# Patient Record
Sex: Female | Born: 1935 | Race: White | Hispanic: No | Marital: Married | Smoking: Former smoker
Health system: Southern US, Community
[De-identification: ages and names within clinical notes are randomized; demographics above are authoritative.]

## PROBLEM LIST (undated history)

## (undated) DIAGNOSIS — M359 Systemic involvement of connective tissue, unspecified: Secondary | ICD-10-CM

## (undated) DIAGNOSIS — E876 Hypokalemia: Secondary | ICD-10-CM

## (undated) DIAGNOSIS — I484 Atypical atrial flutter: Secondary | ICD-10-CM

## (undated) DIAGNOSIS — H269 Unspecified cataract: Secondary | ICD-10-CM

## (undated) DIAGNOSIS — I5032 Chronic diastolic (congestive) heart failure: Secondary | ICD-10-CM

## (undated) DIAGNOSIS — R911 Solitary pulmonary nodule: Secondary | ICD-10-CM

## (undated) DIAGNOSIS — E039 Hypothyroidism, unspecified: Secondary | ICD-10-CM

## (undated) DIAGNOSIS — J449 Chronic obstructive pulmonary disease, unspecified: Secondary | ICD-10-CM

## (undated) DIAGNOSIS — N182 Chronic kidney disease, stage 2 (mild): Secondary | ICD-10-CM

## (undated) DIAGNOSIS — I10 Essential (primary) hypertension: Secondary | ICD-10-CM

## (undated) DIAGNOSIS — I480 Paroxysmal atrial fibrillation: Secondary | ICD-10-CM

## (undated) DIAGNOSIS — C3490 Malignant neoplasm of unspecified part of unspecified bronchus or lung: Secondary | ICD-10-CM

## (undated) HISTORY — PX: APPENDECTOMY: SHX54

## (undated) HISTORY — PX: TOTAL KNEE ARTHROPLASTY: SHX125

## (undated) HISTORY — DX: Chronic diastolic (congestive) heart failure: I50.32

## (undated) HISTORY — DX: Malignant neoplasm of unspecified part of unspecified bronchus or lung: C34.90

## (undated) HISTORY — DX: Chronic kidney disease, stage 2 (mild): N18.2

## (undated) HISTORY — DX: Hypokalemia: E87.6

## (undated) HISTORY — DX: Paroxysmal atrial fibrillation: I48.0

## (undated) HISTORY — DX: Solitary pulmonary nodule: R91.1

## (undated) HISTORY — PX: EYE SURGERY: SHX253

---

## 2013-08-24 DIAGNOSIS — N189 Chronic kidney disease, unspecified: Secondary | ICD-10-CM | POA: Insufficient documentation

## 2013-08-24 DIAGNOSIS — E039 Hypothyroidism, unspecified: Secondary | ICD-10-CM | POA: Insufficient documentation

## 2013-08-24 DIAGNOSIS — J441 Chronic obstructive pulmonary disease with (acute) exacerbation: Secondary | ICD-10-CM | POA: Insufficient documentation

## 2013-08-24 DIAGNOSIS — I10 Essential (primary) hypertension: Secondary | ICD-10-CM | POA: Insufficient documentation

## 2013-09-12 ENCOUNTER — Inpatient Hospital Stay: Payer: Self-pay | Admitting: Internal Medicine

## 2013-09-12 LAB — CBC WITH DIFFERENTIAL/PLATELET
Basophil #: 0.1 10*3/uL (ref 0.0–0.1)
Basophil %: 1.9 %
Eosinophil #: 0 10*3/uL (ref 0.0–0.7)
HCT: 37.5 % (ref 35.0–47.0)
HGB: 12.4 g/dL (ref 12.0–16.0)
Lymphocyte #: 0.6 10*3/uL — ABNORMAL LOW (ref 1.0–3.6)
Lymphocyte %: 17.5 %
MCH: 28.8 pg (ref 26.0–34.0)
MCHC: 33 g/dL (ref 32.0–36.0)
MCV: 87 fL (ref 80–100)
Monocyte #: 0.5 x10 3/mm (ref 0.2–0.9)
Monocyte %: 12.9 %
Neutrophil #: 2.4 10*3/uL (ref 1.4–6.5)
Platelet: 163 10*3/uL (ref 150–440)
RBC: 4.29 10*6/uL (ref 3.80–5.20)
RDW: 15.4 % — ABNORMAL HIGH (ref 11.5–14.5)

## 2013-09-12 LAB — PROTIME-INR
INR: 1.1
Prothrombin Time: 14.6 secs (ref 11.5–14.7)

## 2013-09-12 LAB — CK TOTAL AND CKMB (NOT AT ARMC): CK, Total: 154 U/L (ref 21–215)

## 2013-09-12 LAB — CREATININE, SERUM
Creatinine: 1.07 mg/dL (ref 0.60–1.30)
EGFR (African American): 58 — ABNORMAL LOW
EGFR (Non-African Amer.): 50 — ABNORMAL LOW

## 2013-09-12 LAB — APTT: Activated PTT: 53.9 secs — ABNORMAL HIGH (ref 23.6–35.9)

## 2013-09-12 LAB — TROPONIN I
Troponin-I: 0.04 ng/mL
Troponin-I: 0.04 ng/mL

## 2013-09-13 LAB — COMPREHENSIVE METABOLIC PANEL
Albumin: 3 g/dL — ABNORMAL LOW (ref 3.4–5.0)
Anion Gap: 5 — ABNORMAL LOW (ref 7–16)
BUN: 27 mg/dL — ABNORMAL HIGH (ref 7–18)
Co2: 32 mmol/L (ref 21–32)
Glucose: 139 mg/dL — ABNORMAL HIGH (ref 65–99)
Osmolality: 287 (ref 275–301)
Potassium: 3.2 mmol/L — ABNORMAL LOW (ref 3.5–5.1)
SGOT(AST): 31 U/L (ref 15–37)
SGPT (ALT): 20 U/L (ref 12–78)
Sodium: 140 mmol/L (ref 136–145)
Total Protein: 6.6 g/dL (ref 6.4–8.2)

## 2013-09-13 LAB — CBC WITH DIFFERENTIAL/PLATELET
Basophil #: 0.1 10*3/uL (ref 0.0–0.1)
Basophil %: 2 %
Eosinophil #: 0 10*3/uL (ref 0.0–0.7)
Eosinophil %: 0 %
HCT: 38.5 % (ref 35.0–47.0)
Lymphocyte #: 0.4 10*3/uL — ABNORMAL LOW (ref 1.0–3.6)
Lymphocyte %: 13.3 %
MCHC: 32.7 g/dL (ref 32.0–36.0)
MCV: 88 fL (ref 80–100)
Monocyte #: 0.1 x10 3/mm — ABNORMAL LOW (ref 0.2–0.9)
Neutrophil #: 2.1 10*3/uL (ref 1.4–6.5)
Neutrophil %: 79.6 %
RBC: 4.39 10*6/uL (ref 3.80–5.20)
WBC: 2.7 10*3/uL — ABNORMAL LOW (ref 3.6–11.0)

## 2013-09-13 LAB — APTT: Activated PTT: >160 secs (ref 23.6–35.9)

## 2013-09-13 LAB — MAGNESIUM: Magnesium: 1.5 mg/dL — ABNORMAL LOW

## 2013-09-13 LAB — PRO B NATRIURETIC PEPTIDE: B-Type Natriuretic Peptide: 9027 pg/mL — ABNORMAL HIGH (ref 0–450)

## 2013-09-17 LAB — CREATININE, SERUM
Creatinine: 1.23 mg/dL (ref 0.60–1.30)
EGFR (African American): 49 — ABNORMAL LOW

## 2013-09-25 LAB — PULMONARY FUNCTION TEST

## 2013-09-26 ENCOUNTER — Inpatient Hospital Stay: Payer: Self-pay | Admitting: Internal Medicine

## 2013-09-26 LAB — CBC
HCT: 45.8 % (ref 35.0–47.0)
HGB: 15.2 g/dL (ref 12.0–16.0)
MCH: 28.4 pg (ref 26.0–34.0)
MCHC: 33.2 g/dL (ref 32.0–36.0)
MCV: 86 fL (ref 80–100)
Platelet: 285 10*3/uL (ref 150–440)
RBC: 5.36 10*6/uL — ABNORMAL HIGH (ref 3.80–5.20)
RDW: 15.3 % — AB (ref 11.5–14.5)
WBC: 15.6 10*3/uL — ABNORMAL HIGH (ref 3.6–11.0)

## 2013-09-26 LAB — COMPREHENSIVE METABOLIC PANEL
ALBUMIN: 3.5 g/dL (ref 3.4–5.0)
Alkaline Phosphatase: 58 U/L
Anion Gap: 5 — ABNORMAL LOW (ref 7–16)
BUN: 48 mg/dL — AB (ref 7–18)
Bilirubin,Total: 1.2 mg/dL — ABNORMAL HIGH (ref 0.2–1.0)
CALCIUM: 9.4 mg/dL (ref 8.5–10.1)
CO2: 32 mmol/L (ref 21–32)
Chloride: 96 mmol/L — ABNORMAL LOW (ref 98–107)
Creatinine: 1.14 mg/dL (ref 0.60–1.30)
EGFR (African American): 54 — ABNORMAL LOW
EGFR (Non-African Amer.): 47 — ABNORMAL LOW
Glucose: 106 mg/dL — ABNORMAL HIGH (ref 65–99)
Osmolality: 279 (ref 275–301)
Potassium: 2.9 mmol/L — ABNORMAL LOW (ref 3.5–5.1)
SGOT(AST): 27 U/L (ref 15–37)
SGPT (ALT): 26 U/L (ref 12–78)
SODIUM: 133 mmol/L — AB (ref 136–145)
Total Protein: 7.4 g/dL (ref 6.4–8.2)

## 2013-09-26 LAB — TROPONIN I: TROPONIN-I: 0.16 ng/mL — AB

## 2013-09-26 LAB — CK TOTAL AND CKMB (NOT AT ARMC)
CK, TOTAL: 52 U/L (ref 21–215)
CK-MB: 3.3 ng/mL (ref 0.5–3.6)

## 2013-09-26 LAB — RAPID INFLUENZA A&B ANTIGENS

## 2013-09-26 LAB — PRO B NATRIURETIC PEPTIDE: B-TYPE NATIURETIC PEPTID: 3323 pg/mL — AB (ref 0–450)

## 2013-09-27 LAB — BASIC METABOLIC PANEL
Anion Gap: 6 — ABNORMAL LOW (ref 7–16)
BUN: 49 mg/dL — ABNORMAL HIGH (ref 7–18)
CHLORIDE: 97 mmol/L — AB (ref 98–107)
CO2: 32 mmol/L (ref 21–32)
Calcium, Total: 8.8 mg/dL (ref 8.5–10.1)
Creatinine: 1.28 mg/dL (ref 0.60–1.30)
EGFR (African American): 47 — ABNORMAL LOW
EGFR (Non-African Amer.): 41 — ABNORMAL LOW
GLUCOSE: 143 mg/dL — AB (ref 65–99)
Osmolality: 286 (ref 275–301)
Potassium: 3.1 mmol/L — ABNORMAL LOW (ref 3.5–5.1)
Sodium: 135 mmol/L — ABNORMAL LOW (ref 136–145)

## 2013-09-27 LAB — TROPONIN I: Troponin-I: 0.07 ng/mL — ABNORMAL HIGH

## 2013-09-28 LAB — CBC WITH DIFFERENTIAL/PLATELET
Basophil #: 0 10*3/uL (ref 0.0–0.1)
Basophil %: 0.1 %
Eosinophil #: 0 10*3/uL (ref 0.0–0.7)
Eosinophil %: 0 %
HCT: 36.1 % (ref 35.0–47.0)
HGB: 12.2 g/dL (ref 12.0–16.0)
Lymphocyte #: 0.7 10*3/uL — ABNORMAL LOW (ref 1.0–3.6)
Lymphocyte %: 4.1 %
MCH: 28.9 pg (ref 26.0–34.0)
MCHC: 33.8 g/dL (ref 32.0–36.0)
MCV: 86 fL (ref 80–100)
MONO ABS: 0.5 x10 3/mm (ref 0.2–0.9)
Monocyte %: 2.7 %
Neutrophil #: 16.7 10*3/uL — ABNORMAL HIGH (ref 1.4–6.5)
Neutrophil %: 93.1 %
Platelet: 242 10*3/uL (ref 150–440)
RBC: 4.23 10*6/uL (ref 3.80–5.20)
RDW: 15.1 % — ABNORMAL HIGH (ref 11.5–14.5)
WBC: 18 10*3/uL — ABNORMAL HIGH (ref 3.6–11.0)

## 2013-09-28 LAB — BASIC METABOLIC PANEL
ANION GAP: 7 (ref 7–16)
BUN: 57 mg/dL — ABNORMAL HIGH (ref 7–18)
CALCIUM: 8.7 mg/dL (ref 8.5–10.1)
CHLORIDE: 98 mmol/L (ref 98–107)
CO2: 27 mmol/L (ref 21–32)
CREATININE: 1.39 mg/dL — AB (ref 0.60–1.30)
EGFR (African American): 43 — ABNORMAL LOW
GFR CALC NON AF AMER: 37 — AB
GLUCOSE: 139 mg/dL — AB (ref 65–99)
OSMOLALITY: 283 (ref 275–301)
Potassium: 3.7 mmol/L (ref 3.5–5.1)
SODIUM: 132 mmol/L — AB (ref 136–145)

## 2013-10-16 ENCOUNTER — Ambulatory Visit: Payer: Self-pay | Admitting: Medical

## 2013-11-15 ENCOUNTER — Emergency Department (HOSPITAL_COMMUNITY): Payer: Medicare Other

## 2013-11-15 ENCOUNTER — Inpatient Hospital Stay (HOSPITAL_COMMUNITY)
Admission: EM | Admit: 2013-11-15 | Discharge: 2013-11-24 | DRG: 308 | Disposition: A | Discharge status: Home / Self Care | Payer: Medicare Other | Attending: Family Medicine | Admitting: Family Medicine

## 2013-11-15 ENCOUNTER — Encounter (HOSPITAL_COMMUNITY): Payer: Self-pay | Admitting: Emergency Medicine

## 2013-11-15 DIAGNOSIS — R7989 Other specified abnormal findings of blood chemistry: Secondary | ICD-10-CM

## 2013-11-15 DIAGNOSIS — Z87891 Personal history of nicotine dependence: Secondary | ICD-10-CM

## 2013-11-15 DIAGNOSIS — I471 Supraventricular tachycardia, unspecified: Secondary | ICD-10-CM | POA: Diagnosis present

## 2013-11-15 DIAGNOSIS — Z79899 Other long term (current) drug therapy: Secondary | ICD-10-CM

## 2013-11-15 DIAGNOSIS — B9789 Other viral agents as the cause of diseases classified elsewhere: Secondary | ICD-10-CM | POA: Diagnosis present

## 2013-11-15 DIAGNOSIS — E872 Acidosis, unspecified: Secondary | ICD-10-CM

## 2013-11-15 DIAGNOSIS — J449 Chronic obstructive pulmonary disease, unspecified: Secondary | ICD-10-CM | POA: Diagnosis present

## 2013-11-15 DIAGNOSIS — J441 Chronic obstructive pulmonary disease with (acute) exacerbation: Secondary | ICD-10-CM | POA: Diagnosis present

## 2013-11-15 DIAGNOSIS — M25519 Pain in unspecified shoulder: Secondary | ICD-10-CM | POA: Diagnosis present

## 2013-11-15 DIAGNOSIS — E039 Hypothyroidism, unspecified: Secondary | ICD-10-CM | POA: Diagnosis present

## 2013-11-15 DIAGNOSIS — I484 Atypical atrial flutter: Secondary | ICD-10-CM

## 2013-11-15 DIAGNOSIS — I4891 Unspecified atrial fibrillation: Secondary | ICD-10-CM

## 2013-11-15 DIAGNOSIS — T380X5A Adverse effect of glucocorticoids and synthetic analogues, initial encounter: Secondary | ICD-10-CM | POA: Diagnosis not present

## 2013-11-15 DIAGNOSIS — A419 Sepsis, unspecified organism: Secondary | ICD-10-CM

## 2013-11-15 DIAGNOSIS — N189 Chronic kidney disease, unspecified: Secondary | ICD-10-CM | POA: Diagnosis present

## 2013-11-15 DIAGNOSIS — J069 Acute upper respiratory infection, unspecified: Secondary | ICD-10-CM | POA: Diagnosis present

## 2013-11-15 DIAGNOSIS — I440 Atrioventricular block, first degree: Secondary | ICD-10-CM | POA: Diagnosis present

## 2013-11-15 DIAGNOSIS — I129 Hypertensive chronic kidney disease with stage 1 through stage 4 chronic kidney disease, or unspecified chronic kidney disease: Secondary | ICD-10-CM | POA: Diagnosis present

## 2013-11-15 DIAGNOSIS — I4892 Unspecified atrial flutter: Secondary | ICD-10-CM | POA: Diagnosis present

## 2013-11-15 DIAGNOSIS — N179 Acute kidney failure, unspecified: Secondary | ICD-10-CM | POA: Diagnosis present

## 2013-11-15 DIAGNOSIS — E119 Type 2 diabetes mellitus without complications: Secondary | ICD-10-CM | POA: Diagnosis present

## 2013-11-15 DIAGNOSIS — R5381 Other malaise: Secondary | ICD-10-CM | POA: Diagnosis present

## 2013-11-15 DIAGNOSIS — J9600 Acute respiratory failure, unspecified whether with hypoxia or hypercapnia: Secondary | ICD-10-CM | POA: Diagnosis present

## 2013-11-15 DIAGNOSIS — E876 Hypokalemia: Secondary | ICD-10-CM | POA: Diagnosis present

## 2013-11-15 DIAGNOSIS — G8929 Other chronic pain: Secondary | ICD-10-CM | POA: Diagnosis present

## 2013-11-15 DIAGNOSIS — I959 Hypotension, unspecified: Secondary | ICD-10-CM

## 2013-11-15 DIAGNOSIS — E785 Hyperlipidemia, unspecified: Secondary | ICD-10-CM | POA: Diagnosis present

## 2013-11-15 DIAGNOSIS — R778 Other specified abnormalities of plasma proteins: Secondary | ICD-10-CM

## 2013-11-15 HISTORY — DX: Essential (primary) hypertension: I10

## 2013-11-15 HISTORY — DX: Hypothyroidism, unspecified: E03.9

## 2013-11-15 HISTORY — DX: Unspecified cataract: H26.9

## 2013-11-15 HISTORY — DX: Atypical atrial flutter: I48.4

## 2013-11-15 HISTORY — DX: Chronic obstructive pulmonary disease, unspecified: J44.9

## 2013-11-15 LAB — BASIC METABOLIC PANEL
BUN: 26 mg/dL — ABNORMAL HIGH (ref 6–23)
CHLORIDE: 95 meq/L — AB (ref 96–112)
CO2: 30 mEq/L (ref 19–32)
Calcium: 9.1 mg/dL (ref 8.4–10.5)
Creatinine, Ser: 1.18 mg/dL — ABNORMAL HIGH (ref 0.50–1.10)
GFR calc non Af Amer: 44 mL/min — ABNORMAL LOW (ref >90)
GFR, EST AFRICAN AMERICAN: 51 mL/min — AB (ref >90)
Glucose, Bld: 124 mg/dL — ABNORMAL HIGH (ref 70–99)
POTASSIUM: 3.1 meq/L — AB (ref 3.7–5.3)
Sodium: 140 mEq/L (ref 137–147)

## 2013-11-15 LAB — I-STAT TROPONIN, ED: Troponin i, poc: 0.03 ng/mL (ref 0.00–0.08)

## 2013-11-15 LAB — PRO B NATRIURETIC PEPTIDE: Pro B Natriuretic peptide (BNP): 2417 pg/mL — ABNORMAL HIGH (ref 0–450)

## 2013-11-15 LAB — CBC
HCT: 39.7 % (ref 36.0–46.0)
HEMOGLOBIN: 13.2 g/dL (ref 12.0–15.0)
MCH: 29.6 pg (ref 26.0–34.0)
MCHC: 33.2 g/dL (ref 30.0–36.0)
MCV: 89 fL (ref 78.0–100.0)
Platelets: 206 10*3/uL (ref 150–400)
RBC: 4.46 MIL/uL (ref 3.87–5.11)
RDW: 16.8 % — ABNORMAL HIGH (ref 11.5–15.5)
WBC: 9.5 10*3/uL (ref 4.0–10.5)

## 2013-11-15 MED ORDER — ALBUTEROL (5 MG/ML) CONTINUOUS INHALATION SOLN
15.0000 mg/h | INHALATION_SOLUTION | Freq: Once | RESPIRATORY_TRACT | Status: AC
  Administered 2013-11-15: 15 mg/h via RESPIRATORY_TRACT
  Filled 2013-11-15: qty 20

## 2013-11-15 MED ORDER — METHYLPREDNISOLONE SODIUM SUCC 125 MG IJ SOLR
125.0000 mg | Freq: Once | INTRAMUSCULAR | Status: AC
  Administered 2013-11-15: 125 mg via INTRAVENOUS
  Filled 2013-11-15: qty 2

## 2013-11-15 MED ORDER — DILTIAZEM HCL 25 MG/5ML IV SOLN
10.0000 mg | Freq: Once | INTRAVENOUS | Status: AC
  Administered 2013-11-15: 10 mg via INTRAVENOUS
  Filled 2013-11-15: qty 5

## 2013-11-15 MED ORDER — IPRATROPIUM BROMIDE 0.02 % IN SOLN
1.0000 mg | Freq: Once | RESPIRATORY_TRACT | Status: AC
  Administered 2013-11-15: 1 mg via RESPIRATORY_TRACT
  Filled 2013-11-15: qty 5

## 2013-11-15 MED ORDER — DILTIAZEM HCL 100 MG IV SOLR
5.0000 mg/h | INTRAVENOUS | Status: AC
  Administered 2013-11-15: 5 mg/h via INTRAVENOUS
  Administered 2013-11-15: 10 mg/h via INTRAVENOUS
  Administered 2013-11-15 – 2013-11-16 (×3): 15 mg/h via INTRAVENOUS

## 2013-11-15 NOTE — ED Notes (Addendum)
Report attempted x1. States will called back.

## 2013-11-15 NOTE — ED Notes (Signed)
Pt states SOB and CP left side to shoulder. Hx of Afib and states she feels like her rhythm is back to normal. States she has had to episodes of PNA. Last time Christmas.

## 2013-11-15 NOTE — ED Notes (Signed)
[id number], Dr. Darl Householder notified

## 2013-11-15 NOTE — ED Provider Notes (Signed)
CSN: [id number]     Arrival date & time 11/15/13  1915 History   First MD Initiated Contact with Patient 11/15/13 1922     Chief Complaint  Patient presents with  . Shortness of Breath     (Consider location/radiation/quality/duration/timing/severity/associated sxs/prior Treatment) The history is provided by the patient.  Toni [name] is a 79 y.o. female hx of COPD, paroxysmal afib not on coumadin here with shortness of breath, palpitations. Shortness of breath with exertion over the last week. She'll son notes that she has been wheezing. She just finished a course of steroid a week ago. She noticed palpitations and shortness of breath worsening yesterday so went to [facility] and was given a medication to slow down her heart rate. Today she notes worse shortness of breath and palpitations and came for evaluation. Denies fever. She was a former smoker.    History reviewed. No pertinent past medical history. History reviewed. No pertinent past surgical history. History reviewed. No pertinent family history. History  Substance Use Topics  . Smoking status: Former Research scientist (life sciences)  . Smokeless tobacco: Never Used  . Alcohol Use: No   OB History   Grav Para Term Preterm Abortions TAB SAB Ect Mult Living                 Review of Systems  Respiratory: Positive for shortness of breath.   All other systems reviewed and are negative.      Allergies  Review of patient's allergies indicates not on file.  Home Medications   Current Outpatient Rx  Name  Route  Sig  Dispense  Refill  . ALPRAZolam (XANAX) 0.5 MG tablet   Oral   Take 0.5 mg by mouth 2 (two) times daily as needed for anxiety.          BP 125/74  Pulse 105  Temp(Src) 98.5 F (36.9 C) (Oral)  Resp 24  Ht 5\' 1"  (1.549 m)  Wt 173 lb (78.472 kg)  BMI 32.70 kg/m2  SpO2 100% Physical Exam  Nursing note and vitals reviewed. Constitutional: She is oriented to person, place, and time.  Uncomfortable, tachypneic    HENT:  Head: Normocephalic.  Mouth/Throat: Oropharynx is clear and moist.  Eyes: Conjunctivae are normal. Pupils are equal, round, and reactive to light.  Neck: Normal range of motion. Neck supple.  Cardiovascular: Normal heart sounds.   Irregular, tachycardic   Pulmonary/Chest:  Tachypneic, + diffuse wheezing, no crackles   Abdominal: Soft. Bowel sounds are normal. She exhibits no distension. There is no tenderness. There is no rebound and no guarding.  Musculoskeletal: Normal range of motion. She exhibits no tenderness.  Neurological: She is alert and oriented to person, place, and time. No cranial nerve deficit. Coordination normal.  Skin: Skin is warm and dry.  Psychiatric: She has a normal mood and affect. Her behavior is normal. Judgment and thought content normal.    ED Course  Procedures (including critical care time) CRITICAL CARE Performed by: Darl Householder, DAVID   Total critical care time: 30 min   Critical care time was exclusive of separately billable procedures and treating other patients.  Critical care was necessary to treat or prevent imminent or life-threatening deterioration.  Critical care was time spent personally by me on the following activities: development of treatment plan with patient and/or surrogate as well as nursing, discussions with consultants, evaluation of patient's response to treatment, examination of patient, obtaining history from patient or surrogate, ordering and performing treatments and interventions, ordering and review  of laboratory studies, ordering and review of radiographic studies, pulse oximetry and re-evaluation of patient's condition.   Labs Review Labs Reviewed  CBC - Abnormal; Notable for the following:    RDW 16.8 (*)    All other components within normal limits  BASIC METABOLIC PANEL - Abnormal; Notable for the following:    Potassium 3.1 (*)    Chloride 95 (*)    Glucose, Bld 124 (*)    BUN 26 (*)    Creatinine, Ser 1.18 (*)     GFR calc non Af Amer 44 (*)    GFR calc Af Amer 51 (*)    All other components within normal limits  PRO B NATRIURETIC PEPTIDE - Abnormal; Notable for the following:    Pro B Natriuretic peptide (BNP) 2417.0 (*)    All other components within normal limits  I-STAT TROPOININ, ED   Imaging Review Dg Chest Portable 1 View  11/15/2013   CLINICAL DATA:  Short of breath for several days.  EXAM: PORTABLE CHEST - 1 VIEW  COMPARISON:  11/12/2008  FINDINGS: Cardiac silhouette is normal in size. Aorta is mildly uncoiled. No mediastinal or hilar masses.  Small ill-defined nodule in the right mid lung is stable from the prior exam. No lung consolidation or edema. No pleural effusion or pneumothorax.  The bony thorax is diffusely demineralized.  IMPRESSION: No acute cardiopulmonary disease.   Electronically Signed   By: Lajean Manes M.D.   On: 11/15/2013 19:55     EKG Interpretation   Date/Time:  Saturday November 15 2013 19:18:21 EST Ventricular Rate:  104 PR Interval:    QRS Duration: 88 QT Interval:  370 QTC Calculation: 486 R Axis:   53 Text Interpretation:  Atrial flutter with variable A-V block Nonspecific  ST abnormality Abnormal ECG No previous ECGs available Confirmed by YAO   MD, DAVID ([id number]) on 11/15/2013 7:23:13 PM      MDM   Final diagnoses:  None   Toni [name] is a 79 y.o. female here with SOB, palpitations. Likely COPD exacerbation along with afib. I doubt PE given that she has obvious wheezing on exam to explain her shortness of breath. Will get cxr, labs. Will give nebs, steroids, cardizem for rate control.    8:48 PM After getting nebs, breathing improved. But HR went up to 160s. Given cardizem, started on cardizem drip for rate control. Will admit for COPD exacerbation, rapid afib   Wandra Arthurs, MD 11/15/13 2052

## 2013-11-15 NOTE — H&P (Signed)
[facility] Admission History and Physical Service Pager: [phone]  Patient name: Toni [name] Medical record number: [id number] Date of birth: 09-08-1935 Age: 79 y.o. Gender: female  Primary Care Provider: Tomma Rakers, PA-C Consultants: Cardiology Code Status: Full  Chief Complaint: SOB, heart palpitations  Assessment and Plan: Toni [name] is a 79 y.o. female presenting with palpatations and shortness of breath found to be in atrial fibrillation with RVR. PMH is significant for COPD, paroxysmal AF (not on anticoag), hypertension, hypothyroidism.  # Afib with RVR: history of paroxysmal afib and anticoagulation treatment with pradaxa, but currently not anticoagulated. Since December after pneumonia she has had more frequent episodes of heart palpitations. Currently in RVR with rate 120s-150s while on diltiazem drip in ED, BP stable 122/86. ED workup with normal CBC, Bmet with K+ of 3.1, CO2 30, Cr 1.18, BNP elevated to 2417, EKG with atrial fib with RVR, CXR without evidence of pneumonia or fluid overload. She does not appear to be fluid overloaded on exam. CHADS2-VASc score 4 (if reported EF of 55 is correct), 4% yearly risk of CVA. Will also do workup for ACS. - admit to SDU, attending Dr. Gwendlyn Deutscher - telemetry and continuous pulse ox - continue diltiazem drip - labetalol 10mg  IV q2hr PRN for HR >160; hold if SBP <100 or DBP <60 - daily ASA 81mg  - cycle troponins x 3 - Labs in AM = Bmet, Mag, TSH, A1c, lipid panel - repeat EKG in AM, ordered echo - consult to cardiology in the AM regarding echo, anticoagulation, setting up with outpatient  # COPD: in ED with some previously finished 1 week steroids 2/23. With SOB could be COPD exacerbation, though certainly afib with RVR could explain all of her symptoms. Received solumedrol in ED. On exam with some scattered rhonchi but not very wheezy (just received nebs treatment), CXR clear. No leukocytosis or fever  currently.  Per home meds review, takes nebulized pulmicort and formoterol in addition to symbicort inhaler; also takes atrovent - continue symbicort and atrovent daily - supplemental O2 to keep sat >92%  # ?AKI vs CKD: admission 1.18 (GFR 44), no prior results in EMR.  - continue to monitor with daily bmet,  # Hypokalemia: 3.1 on admission, takes potassium at home - replete as needed  # Hypothyroidism: - on synthroid 51mcg daily - will check TSH  # Hypertension: on lasix 20mg  qday, hctz, diltiazem 120mg  24hr at home - continue home lasix, hold hctz and diltiazem  FEN/GI: diet heart, fluids with dilt drip Prophylaxis: heparin (pending decision on longterm anticoag)  Disposition: admit to SDU  History of Present Illness: Toni [name] is a 79 y.o. female presenting with SOB and heart palpitations. Previously seen at [facility] 2 days ago and finished 1 week course of steroids on 2/23. She primarily complains of SOB with exertion but also at rest, and feelings of heart racing. Earlier today her son and husband state she was not acting her usual self, falling asleep during conversation while sitting in a chair, not making much sense while talking at times which prompted them to bring her to [facility] ED; currently back to baseline. She also has had some intermittent pain in her left shoulder that is currently gone.   She reports a history of afib about 1-2 years ago, says she was on pradaxa for a time but stopped after cutting her finger while cooking (she works currently as a Technical brewer). Son also reports that he thinks she had an echo  done before (unsure when, ?1.5 months ago) with an EF 54%. She sleeps with 2 pillows at night, denies nighttime awakenings. Overall, the patient and her family are moderate historians and do not bring any records of previous visits.   Review Of Systems: Per HPI with the following additions: denies lightheadedness/dizziness, fevers/chills, CP, abdominal pain, does  endorse some feet swelling, has non-productive cough Otherwise 12 point review of systems was performed and was unremarkable.  Patient Active Problem List   Diagnosis Date Noted  . COPD (chronic obstructive pulmonary disease) 11/15/2013   Past Medical History: Past Medical History  Diagnosis Date  . Cataract   . COPD (chronic obstructive pulmonary disease)   . Hypertension   . Thyroid disease    Past Surgical History: Past Surgical History  Procedure Laterality Date  . Eye surgery     Social History: History  Substance Use Topics  . Smoking status: Former Smoker    Quit date: 09/18/2002  . Smokeless tobacco: Never Used  . Alcohol Use: No   Additional social history: lives at home with husband. Former smoker x 40 yrs x 1.5ppd, quit 75yrs ago Please also refer to relevant sections of EMR.  Family History: History reviewed. No pertinent family history. Allergies and Medications: No Known Allergies No current facility-administered medications on file prior to encounter.   No current outpatient prescriptions on file prior to encounter.    Objective: BP 122/86  Pulse 117  Temp(Src) 98.5 F (36.9 C) (Oral)  Resp 22  Ht 5\' 1"  (1.549 m)  Wt 173 lb (78.472 kg)  BMI 32.70 kg/m2  SpO2 91% Exam: General: NAD, with [place], somewhat hard of hearing HEENT: Pupils equal/round, pinpoint and slightly reactive to light bilaterally (h/o cataract surgery). MMM. Cardiovascular: irreg irreg, tachycardia, normal heart sounds, no murmurs. Barrel chested. Respiratory: scattered rhonchi and crackles, very mild wheezes. Mild increased WOB, but pt and and her family feel that her work of breathing is at her baseline Abdomen: obese, soft, NTND Extremities: trace edema bilaterally. WWP. Skin: warm and dry. Some ecchymosis of b/l shins and right hand Neuro: alert and oriented, no focal deficits.  Labs and Imaging: CBC BMET   Recent Labs Lab 11/15/13 1941  WBC 9.5  HGB 13.2  HCT 39.7   PLT 206    Recent Labs Lab 11/15/13 1941  NA 140  K 3.1*  CL 95*  CO2 30  BUN 26*  CREATININE 1.18*  GLUCOSE 124*  CALCIUM 9.1     i-stat troponin 0.03 BNP 2417  Dg Chest Portable 1 View  11/15/2013   CLINICAL DATA:  Short of breath for several days.  EXAM: PORTABLE CHEST - 1 VIEW  COMPARISON:  11/12/2008  FINDINGS: Cardiac silhouette is normal in size. Aorta is mildly uncoiled. No mediastinal or hilar masses.  Small ill-defined nodule in the right mid lung is stable from the prior exam. No lung consolidation or edema. No pleural effusion or pneumothorax.  The bony thorax is diffusely demineralized.  IMPRESSION: No acute cardiopulmonary disease.   Electronically Signed   By: Lajean Manes M.D.   On: 11/15/2013 19:55    Tawanna Sat, MD 11/15/2013, 9:54 PM PGY-1, [place] Intern pager: [phone], text pages welcome  I have evaluated with the patient. I agree with the note above. Alterations were made in this font color.   Marena Chancy Maricela Bo, MD, [place] 11/15/2013, 11:25 PM Family Medicine Resident, PGY-3

## 2013-11-16 ENCOUNTER — Encounter (HOSPITAL_COMMUNITY): Payer: Self-pay

## 2013-11-16 DIAGNOSIS — I484 Atypical atrial flutter: Secondary | ICD-10-CM | POA: Diagnosis present

## 2013-11-16 DIAGNOSIS — I4891 Unspecified atrial fibrillation: Secondary | ICD-10-CM | POA: Diagnosis present

## 2013-11-16 DIAGNOSIS — J449 Chronic obstructive pulmonary disease, unspecified: Secondary | ICD-10-CM

## 2013-11-16 DIAGNOSIS — I4892 Unspecified atrial flutter: Secondary | ICD-10-CM

## 2013-11-16 HISTORY — DX: Atypical atrial flutter: I48.4

## 2013-11-16 LAB — TSH: TSH: 0.585 u[IU]/mL (ref 0.350–4.500)

## 2013-11-16 LAB — BASIC METABOLIC PANEL
BUN: 31 mg/dL — ABNORMAL HIGH (ref 6–23)
BUN: 37 mg/dL — AB (ref 6–23)
CHLORIDE: 96 meq/L (ref 96–112)
CHLORIDE: 92 meq/L — AB (ref 96–112)
CO2: 23 mEq/L (ref 19–32)
CO2: 22 mEq/L (ref 19–32)
CREATININE: 1.23 mg/dL — AB (ref 0.50–1.10)
Calcium: 8.9 mg/dL (ref 8.4–10.5)
Calcium: 9.2 mg/dL (ref 8.4–10.5)
Creatinine, Ser: 1.21 mg/dL — ABNORMAL HIGH (ref 0.50–1.10)
GFR calc non Af Amer: 42 mL/min — ABNORMAL LOW (ref >90)
GFR, EST AFRICAN AMERICAN: 48 mL/min — AB (ref >90)
GFR, EST AFRICAN AMERICAN: 49 mL/min — AB (ref >90)
GFR, EST NON AFRICAN AMERICAN: 42 mL/min — AB (ref >90)
Glucose, Bld: 274 mg/dL — ABNORMAL HIGH (ref 70–99)
Glucose, Bld: 248 mg/dL — ABNORMAL HIGH (ref 70–99)
Potassium: 2.5 mEq/L — CL (ref 3.7–5.3)
Potassium: 3.7 mEq/L (ref 3.7–5.3)
SODIUM: 138 meq/L (ref 137–147)
Sodium: 145 mEq/L (ref 137–147)

## 2013-11-16 LAB — LIPID PANEL
CHOLESTEROL: 220 mg/dL — AB (ref 0–200)
HDL: 53 mg/dL (ref >39)
LDL CALC: 155 mg/dL — AB (ref 0–99)
Total CHOL/HDL Ratio: 4.2 RATIO
Triglycerides: 58 mg/dL (ref <150)
VLDL: 12 mg/dL (ref 0–40)

## 2013-11-16 LAB — MAGNESIUM: MAGNESIUM: 1.5 mg/dL (ref 1.5–2.5)

## 2013-11-16 LAB — TROPONIN I
Troponin I: <0.3 ng/mL (ref <0.30)
Troponin I: <0.3 ng/mL (ref <0.30)

## 2013-11-16 LAB — MRSA PCR SCREENING: MRSA by PCR: NEGATIVE

## 2013-11-16 LAB — HEMOGLOBIN A1C
Hgb A1c MFr Bld: 5.7 % — ABNORMAL HIGH (ref <5.7)
Mean Plasma Glucose: 117 mg/dL — ABNORMAL HIGH (ref <117)

## 2013-11-16 MED ORDER — FUROSEMIDE 20 MG PO TABS
20.0000 mg | ORAL_TABLET | Freq: Every day | ORAL | Status: DC
  Administered 2013-11-16 – 2013-11-18 (×3): 20 mg via ORAL
  Filled 2013-11-16 (×4): qty 1

## 2013-11-16 MED ORDER — DOCUSATE SODIUM 100 MG PO CAPS
100.0000 mg | ORAL_CAPSULE | Freq: Every day | ORAL | Status: DC | PRN
  Administered 2013-11-16: 100 mg via ORAL
  Filled 2013-11-16 (×2): qty 1

## 2013-11-16 MED ORDER — DILTIAZEM HCL ER COATED BEADS 180 MG PO CP24
180.0000 mg | ORAL_CAPSULE | Freq: Every day | ORAL | Status: DC
  Filled 2013-11-16: qty 1

## 2013-11-16 MED ORDER — ASPIRIN EC 81 MG PO TBEC
81.0000 mg | DELAYED_RELEASE_TABLET | Freq: Every day | ORAL | Status: DC
  Administered 2013-11-16 – 2013-11-19 (×4): 81 mg via ORAL
  Filled 2013-11-16 (×4): qty 1

## 2013-11-16 MED ORDER — IPRATROPIUM-ALBUTEROL 0.5-2.5 (3) MG/3ML IN SOLN
3.0000 mL | RESPIRATORY_TRACT | Status: DC
  Administered 2013-11-16 – 2013-11-18 (×8): 3 mL via RESPIRATORY_TRACT
  Filled 2013-11-16 (×7): qty 3

## 2013-11-16 MED ORDER — LABETALOL HCL 5 MG/ML IV SOLN
10.0000 mg | INTRAVENOUS | Status: DC | PRN
  Filled 2013-11-16 (×2): qty 4

## 2013-11-16 MED ORDER — LEVOTHYROXINE SODIUM 75 MCG PO TABS
75.0000 ug | ORAL_TABLET | Freq: Every day | ORAL | Status: DC
  Administered 2013-11-16 – 2013-11-18 (×3): 75 ug via ORAL
  Filled 2013-11-16 (×4): qty 1

## 2013-11-16 MED ORDER — LORATADINE 10 MG PO TABS
10.0000 mg | ORAL_TABLET | Freq: Every day | ORAL | Status: DC
  Administered 2013-11-16 – 2013-11-18 (×3): 10 mg via ORAL
  Filled 2013-11-16 (×3): qty 1

## 2013-11-16 MED ORDER — POTASSIUM CHLORIDE 10 MEQ/100ML IV SOLN
10.0000 meq | INTRAVENOUS | Status: AC
  Administered 2013-11-16 (×5): 10 meq via INTRAVENOUS
  Filled 2013-11-16 (×7): qty 100

## 2013-11-16 MED ORDER — DILTIAZEM HCL ER COATED BEADS 240 MG PO CP24
240.0000 mg | ORAL_CAPSULE | Freq: Every day | ORAL | Status: DC
  Administered 2013-11-16 – 2013-11-24 (×9): 240 mg via ORAL
  Filled 2013-11-16 (×11): qty 1

## 2013-11-16 MED ORDER — BUDESONIDE-FORMOTEROL FUMARATE 160-4.5 MCG/ACT IN AERO
2.0000 | INHALATION_SPRAY | Freq: Two times a day (BID) | RESPIRATORY_TRACT | Status: DC
  Administered 2013-11-16 – 2013-11-18 (×5): 2 via RESPIRATORY_TRACT
  Filled 2013-11-16: qty 6

## 2013-11-16 MED ORDER — POTASSIUM CHLORIDE CRYS ER 20 MEQ PO TBCR
20.0000 meq | EXTENDED_RELEASE_TABLET | Freq: Two times a day (BID) | ORAL | Status: DC
  Administered 2013-11-16 – 2013-11-18 (×6): 20 meq via ORAL
  Filled 2013-11-16 (×8): qty 1

## 2013-11-16 MED ORDER — ALPRAZOLAM 0.25 MG PO TABS
0.5000 mg | ORAL_TABLET | Freq: Two times a day (BID) | ORAL | Status: DC | PRN
  Administered 2013-11-16 – 2013-11-17 (×2): 0.5 mg via ORAL
  Filled 2013-11-16: qty 2
  Filled 2013-11-16: qty 1

## 2013-11-16 MED ORDER — NITROGLYCERIN 0.4 MG SL SUBL: 0.4000 mg | SUBLINGUAL_TABLET | SUBLINGUAL | Status: DC | PRN

## 2013-11-16 MED ORDER — IPRATROPIUM BROMIDE 0.02 % IN SOLN
0.5000 mg | Freq: Four times a day (QID) | RESPIRATORY_TRACT | Status: DC
Start: 2013-11-16 — End: 2013-11-16
  Administered 2013-11-16 (×4): 0.5 mg via RESPIRATORY_TRACT
  Filled 2013-11-16 (×4): qty 2.5

## 2013-11-16 MED ORDER — HEPARIN SODIUM (PORCINE) 5000 UNIT/ML IJ SOLN
5000.0000 [IU] | Freq: Three times a day (TID) | INTRAMUSCULAR | Status: DC
  Administered 2013-11-16 – 2013-11-17 (×5): 5000 [IU] via SUBCUTANEOUS
  Filled 2013-11-16 (×8): qty 1

## 2013-11-16 NOTE — H&P (Addendum)
FMTS ATTENDING ADMISSION NOTE Ronen Bromwell,MD I  have seen and examined this patient, reviewed their chart. I have discussed this patient with the resident. I agree with the resident's findings, assessment and care plan.  79 Y/O F with pmx of COPD,Afib,HTN, hypothyroidism, presented to the hospital for 1 wk hx of SOB and palpiation on and off which worsens over the last few days,she denies any cough,no chest pain, she is compliant with all her home COPD medications.She denies fever or any sick contact.Breathing much better now.  Filed Vitals:   11/16/13 0134 11/16/13 0318 11/16/13 0800 11/16/13 0827  BP:  93/53 91/45   Pulse:  76    Temp:  97.9 F (36.6 C) 97.4 F (36.3 C)   TempSrc:  Oral Oral   Resp:  21    Height: 5\' 1"  (1.549 m)     Weight: 171 lb 11.8 oz (77.9 kg)     SpO2:  95%  94%   Exam: Gen: Calm in bed,not in distress. HEENT: PERRLA, EOMI. Resp: Air entry equal b/l with mild expiratory wheezing. CV: S1 S2 normal, no murmur,irregular rhythm. Abd: Soft, BS+,NT/ND. Ext: No edema.  A/P: 79 Y/O F with. 1. Palpitation/Afib: Found to be in RVR Afib     Patient started on Cardizem drip.     Lab reviewed,TNI negative.     EKG reviewed,in afib with non-specific ST changes.     Gradually taper Cardizem drip with HR improvement.     Restart home regimen and Labetalol.     Monitor rhythm on telemetry.     Check electrolyte and replace as needed.  2. Elevated ProBNP: No diagnosis of CHF.     ?? SOB related to CHF.     Patient euvolemic.     Chest xray negative for pleural effusion or pulmonary congestion.     Plan to check ECHO for LVEF.      3. COPD: Does not seem to be in exacerbation.     I agree with continuing home regimen.    Monitor O2 requirement.  4. Hypokalemia:     May be contributing to worsening Afib.     Replace electrolyte as needed.  Other chronic conditions,continue home regimen.

## 2013-11-16 NOTE — Progress Notes (Signed)
FMTS ATTENDING  NOTE Toni Malan,MD I  have seen and examined this patient, reviewed their chart. I have discussed this patient with the resident. I agree with the resident's findings, assessment and care plan. 

## 2013-11-16 NOTE — Progress Notes (Signed)
Family Medicine Teaching Service Daily Progress Note Intern Pager: [phone]  Patient name: Toni [name] Medical record number: [id number] Date of birth: [birth year] Age: 79 y.o. Gender: female  Primary Care Provider: Tomma Rakers, PA-C Consultants: Cardiology Code Status: Full  Pt Overview and Major Events to Date:  2/28- admitted with Afib RVR started on diltiazem drip 3/1- dyspnea improved  Assessment and Plan: Toni [name] is a 79 y.o. female presenting with palpatations and shortness of breath found to be in atrial fibrillation with RVR. PMH is significant for COPD, paroxysmal AF (not on anticoag), hypertension, hypothyroidism. Etiology of dyspnea certainly could be multifactorial but likely is due to Afib with RVR.   # Afib with RVR: Controlled - history of paroxysmal afib and anticoagulation treatment with pradaxa, but currently not anticoagulated. CHADS2-VASc score 4 (if reported EF of 55 is correct), 4% yearly risk of CVA. - CXR without evidence of pneumonia or fluid overload. She does not appear to be fluid overloaded on exam.  - telemetry and continuous pulse ox  - continue diltiazem drip and labetalol 10mg  IV q2hr PRN for HR >160; hold if SBP <100 or DBP <60, currently titrating down  - daily ASA 81mg   - troponins neg X 2 - LDL 155,  - Mag WNL, K 2.5 being replaced - A1C and BMP pending - Echo today - Cardiology consulted regarding echo, anticoagulation, setting up with outpatient - appreciate recommendations.   # COPD:  - previously finished 1 week steroids 2/23. - Some wheezes on exam, as well as crackles @ BL bases - continue symbicort and atrovent daily  - Q6 hr duonebs - supplemental O2 to keep sat >92%   # ?AKI vs CKD: admission 1.18 (GFR 44), no prior results in EMR.  - continue to monitor with daily bmet,   # Hypokalemia:  - 3.1 on admission down to 2.5 overnight - takes potassium 20 mEq BID @ home- continue  - now s/p 5 runs of 10 mEq KCl also  #  hyperglycemia - one gluose of 274, likely has DM2 - A1C pending - plan to start SSI if A1C >6.5  # Hypothyroidism:  - on synthroid 40mcg daily  - will check TSH   # Hypertension: on lasix 20mg  qday, hctz, diltiazem 120mg  24hr at home  - continue home lasix, hold hctz and diltiazem   FEN/GI: diet heart, fluids with dilt drip  Prophylaxis: heparin (pending decision on longterm anticoag)  Disposition: Continue SDU for now, to tele when HR controled on PO meds  Subjective:  Feeling better, dyspnea improved.  Objective: Temp:  [97.9 F (36.6 C)-98.5 F (36.9 C)] 97.9 F (36.6 C) (03/01 0318) Pulse Rate:  [28-117] 76 (03/01 0318) Resp:  [18-32] 21 (03/01 0318) BP: (93-131)/(52-95) 93/53 mmHg (03/01 0318) SpO2:  [91 %-100 %] 94 % (03/01 0827) Weight:  [171 lb 11.8 oz (77.9 kg)-173 lb (78.472 kg)] 171 lb 11.8 oz (77.9 kg) (03/01 0134) Physical Exam:  Gen: NAD, alert, cooperative with exam HEENT: NCAT CV: RRR, good S1/S2, no murmur Resp: good air movement, crackles @ Bl Bases, Exp wheezes BL Upper fields, non labored Abd: SNTND, BS present, no guarding or organomegaly Ext: No edema, warm, 2+ DP pulses, varicose veins Neuro: Alert and oriented, No gross deficits   Laboratory:  Recent Labs Lab 11/15/13 1941  WBC 9.5  HGB 13.2  HCT 39.7  PLT 206    Recent Labs Lab 11/15/13 1941 11/16/13 0117  NA 140 145  K 3.1* 2.5*  CL 95* 96  CO2 30 23  BUN 26* 31*  CREATININE 1.18* 1.23*  CALCIUM 9.1 8.9  GLUCOSE 124* 274*      11/16/2013 01:17  Cholesterol 220 (H)  Triglycerides 58  HDL 53  LDL (calc) 155 (H)  VLDL 12  Total CHOL/HDL Ratio 4.2    Imaging/Diagnostic Tests: CXR 2/28 IMPRESSION:  No acute cardiopulmonary disease.   Timmothy Euler, MD 11/16/2013, 8:34 AM PGY-2, [place] Intern pager: [phone], text pages welcome

## 2013-11-16 NOTE — Consult Note (Signed)
Primary cardiologist: Has seen Dr. Lujean Amel at [facility] (not regular followup) Consulting cardiologist: Dr. Satira Sark  Clinical Summary Toni [name] is a 79 y.o.female currently admitted to [facility] service with shortness of breath and palpitations. Little history is available within our record system, however the patient tells me that she has at least a year history of intermittent palpitations and tachycardia, possibly diagnosis of paroxysmal atrial fibrillation. She has seen Dr. Clayborn Bigness at [facility] in the past, was at one point on Pradaxa, however decided to stop this after a month when she was having some difficulty stopping a cut on her finger from bleeding. It does not sound as if she has had regular cardiology followup, may have seen someone in [place] as well.  She is noted to be in atypical atrial flutter versus ectopic atrial tachycardia, initially with RVR, however heart rate is most recently in the 60s on intravenous Cardizem with 4:1 block. She reports no current chest pain or shortness of breath at rest.  Cardiac markers argue against ACS. Pro-BNP level is mildly increased to 2417, echocardiogram pending. I do not see a recent TSH.  CHADSVASC score is 4 at this point. She states that she was taking Cardizem CD possibly 180 mg once daily at home, also potentially metoprolol. Details are not clear.   No Known Allergies  Medications Scheduled Medications: . aspirin EC  81 mg Oral Daily  . budesonide-formoterol  2 puff Inhalation BID  . furosemide  20 mg Oral Daily  . heparin  5,000 Units Subcutaneous 3 times per day  . ipratropium  0.5 mg Inhalation Q6H  . levothyroxine  75 mcg Oral QAC breakfast  . loratadine  10 mg Oral Daily  . potassium chloride  20 mEq Oral BID    Infusions: . diltiazem (CARDIZEM) infusion 10 mg/hr (11/16/13 1000)    PRN Medications: ALPRAZolam, labetalol, nitroGLYCERIN   Past Medical History  Diagnosis Date  . Cataract   .  COPD (chronic obstructive pulmonary disease)   . Essential hypertension, benign   . Hypothyroidism     Past Surgical History  Procedure Laterality Date  . Eye surgery      Family History  Problem Relation Age of Onset  . CAD Father   . Arrhythmia Sister     Social History Toni [name] reports that she quit smoking about 11 years ago. Her smoking use included Cigarettes. She smoked 0.00 packs per day. She has never used smokeless tobacco. Ms. Gillooly reports that she does not drink alcohol.  Review of Systems Some wheezing recently, no progressive cough or hemoptysis. No chest pain. No focal motor weakness or speech deficits. No bleeding problems. Stable appetite. Otherwise negative.  Physical Examination Blood pressure 91/45, pulse 76, temperature 97.4 F (36.3 C), temperature source Oral, resp. rate 21, height 5\' 1"  (1.549 m), weight 171 lb 11.8 oz (77.9 kg), SpO2 94.00%.  Intake/Output Summary (Last 24 hours) at 11/16/13 1044 Last data filed at 11/16/13 1013  Gross per 24 hour  Intake 1116.08 ml  Output    500 ml  Net 616.08 ml   Patient appears comfortable at rest. HEENT: Conjunctiva and lids normal, oropharynx clear with moist mucosa. Neck: Supple, no elevated JVP or carotid bruits, no thyromegaly. Lungs: Prolonged expiratory phase, scattered rhonchi, nonlabored breathing at rest. Cardiac: Largely regular rate and rhythm, no S3 or significant systolic murmur, no pericardial rub. Abdomen: Soft, nontender, bowel sounds present, no guarding or rebound. Extremities: No pitting edema, distal pulses  2+. Skin: Warm and dry. Musculoskeletal: No kyphosis. Neuropsychiatric: Alert and oriented x3, affect grossly appropriate.   Lab Results  Basic Metabolic Panel:  Recent Labs Lab 11/15/13 1941 11/16/13 0117  NA 140 145  K 3.1* 2.5*  CL 95* 96  CO2 30 23  GLUCOSE 124* 274*  BUN 26* 31*  CREATININE 1.18* 1.23*  CALCIUM 9.1 8.9  MG  --  1.5    CBC:  Recent Labs Lab  11/15/13 1941  WBC 9.5  HGB 13.2  HCT 39.7  MCV 89.0  PLT 206    Cardiac Enzymes:  Recent Labs Lab 11/16/13 0017 11/16/13 0809  TROPONINI <0.30 <0.30    ECG  Most recent tracing consistent with atypical atrial flutter or an ectopic atrial tachycardia with 4:1 block and controlled heart rate.  Imaging PORTABLE CHEST - 1 VIEW  COMPARISON: 11/12/2008  FINDINGS:  Cardiac silhouette is normal in size. Aorta is mildly uncoiled. No  mediastinal or hilar masses.  Small ill-defined nodule in the right mid lung is stable from the  prior exam. No lung consolidation or edema. No pleural effusion or  pneumothorax.  The bony thorax is diffusely demineralized.  IMPRESSION:  No acute cardiopulmonary disease.   Impression  1. Atypical atrial flutter versus ectopic atrial tachycardia, initially presenting with RVR, heart rate now controlled in the 60s on intravenous Cardizem. Based on her history, it sounds like she has had paroxysmal atrial arrhythmias at least over the last year as detailed above. CHADSVASC score is 4 at the current time.  2. COPD/bronchitis. May also exacerbate problem #1.  3. History of hypertension.  4. History of hypothyroidism, on Synthroid. No recent TSH.  5. Small, ill-defined nodule in right mid lung zone described as being stable from previous chest film. Uncertain whether this has ever been investigated further via chest CT. Defer further workup to primary team.  6. Hypokalemia.  Recommendations  Recommend check TSH, replete potassium. Consider converting intravenous Cardizem to Cardizem CD 240 mg daily. This can be titrated further as needed, or consider adding either digoxin or low-dose beta blocker if additional control is needed. Followup on echocardiogram for cardiac structural assessment. In terms of stroke prophylaxis, consideration could be given to either Eliquis or Xarelto. She will need to maintain regular cardiology followup either way. I  mentioned to her the possibility of following up with one of our cardiologist at [facility], either Dr. Rockey Situ or Dr Fletcher Anon. She was interested in this, lives close to the hospital in [place]. We will continue to follow with you.   Satira Sark, M.D., F.A.C.C.

## 2013-11-16 NOTE — Progress Notes (Signed)
CRITICAL VALUE ALERT  Critical value received:  K+ 2.5  Date of notification:  11/16/2013   Time of notification:  2:35 AM   Critical value read back:yes  Nurse who received alert:  Mike Craze   MD notified (1st page):  Eloy End, MD  Time of first page:  2:35 AM   MD notified (2nd page):  Time of second page:  Responding MD:    Time MD responded:

## 2013-11-17 ENCOUNTER — Inpatient Hospital Stay (HOSPITAL_COMMUNITY): Payer: Medicare Other

## 2013-11-17 DIAGNOSIS — I359 Nonrheumatic aortic valve disorder, unspecified: Secondary | ICD-10-CM

## 2013-11-17 DIAGNOSIS — I4891 Unspecified atrial fibrillation: Principal | ICD-10-CM

## 2013-11-17 LAB — BASIC METABOLIC PANEL
BUN: 48 mg/dL — ABNORMAL HIGH (ref 6–23)
CHLORIDE: 97 meq/L (ref 96–112)
CO2: 28 meq/L (ref 19–32)
Calcium: 9 mg/dL (ref 8.4–10.5)
Creatinine, Ser: 1.2 mg/dL — ABNORMAL HIGH (ref 0.50–1.10)
GFR calc non Af Amer: 43 mL/min — ABNORMAL LOW (ref >90)
GFR, EST AFRICAN AMERICAN: 50 mL/min — AB (ref >90)
Glucose, Bld: 131 mg/dL — ABNORMAL HIGH (ref 70–99)
Potassium: 4.2 mEq/L (ref 3.7–5.3)
Sodium: 138 mEq/L (ref 137–147)

## 2013-11-17 MED ORDER — PREDNISONE 20 MG PO TABS
20.0000 mg | ORAL_TABLET | Freq: Every day | ORAL | Status: DC
  Filled 2013-11-17 (×2): qty 1

## 2013-11-17 MED ORDER — ACETAMINOPHEN 325 MG PO TABS
650.0000 mg | ORAL_TABLET | Freq: Four times a day (QID) | ORAL | Status: DC | PRN
  Administered 2013-11-17 – 2013-11-21 (×3): 650 mg via ORAL
  Filled 2013-11-17 (×3): qty 2

## 2013-11-17 MED ORDER — RIVAROXABAN 15 MG PO TABS: 15.0000 mg | ORAL_TABLET | Freq: Every day | ORAL | Status: DC

## 2013-11-17 MED ORDER — FUROSEMIDE 10 MG/ML IJ SOLN
20.0000 mg | Freq: Once | INTRAMUSCULAR | Status: AC
  Administered 2013-11-17: 20 mg via INTRAVENOUS
  Filled 2013-11-17: qty 2

## 2013-11-17 MED ORDER — MENTHOL 3 MG MT LOZG
1.0000 | LOZENGE | OROMUCOSAL | Status: DC | PRN
  Filled 2013-11-17 (×2): qty 9

## 2013-11-17 MED ORDER — DILTIAZEM HCL ER BEADS 240 MG PO CP24: 240.0000 mg | ORAL_CAPSULE | Freq: Every day | ORAL | Status: DC

## 2013-11-17 MED ORDER — RIVAROXABAN 15 MG PO TABS
15.0000 mg | ORAL_TABLET | Freq: Every day | ORAL | Status: DC
  Administered 2013-11-17 – 2013-11-18 (×2): 15 mg via ORAL
  Filled 2013-11-17 (×3): qty 1

## 2013-11-17 MED ORDER — FUROSEMIDE 10 MG/ML IJ SOLN
INTRAMUSCULAR | Status: AC
  Filled 2013-11-17: qty 4

## 2013-11-17 MED ORDER — CHLORPHENIRAMINE MALEATE 4 MG PO TABS
4.0000 mg | ORAL_TABLET | ORAL | Status: DC | PRN
  Administered 2013-11-17: 4 mg via ORAL
  Filled 2013-11-17: qty 1

## 2013-11-17 MED ORDER — ACETAMINOPHEN 325 MG PO TABS: 325.0000 mg | ORAL_TABLET | ORAL | Status: DC | PRN

## 2013-11-17 MED ORDER — MAGNESIUM SULFATE 40 MG/ML IJ SOLN
2.0000 g | Freq: Once | INTRAMUSCULAR | Status: AC
  Administered 2013-11-17: 2 g via INTRAVENOUS
  Filled 2013-11-17: qty 50

## 2013-11-17 NOTE — Progress Notes (Signed)
Family Medicine Teaching Service Daily Progress Note Intern Pager: [phone]  Patient name: Toni [name] Medical record number: [id number] Date of birth: Nov 25, 1934 Age: 79 y.o. Gender: [name]  Primary Care Provider: Tomma Rakers, PA-C Consultants: Cardiology Code Status: Full  Pt Overview and Major Events to Date:  2/28- admitted with Afib RVR started on diltiazem drip 3/1- dyspnea improved  Assessment and Plan: Toni [name] is a 79 y.o. [name] presenting with palpatations and shortness of breath found to be in atrial fibrillation with RVR. PMH is significant for COPD, paroxysmal AF (not on anticoag), hypertension, hypothyroidism. Etiology of dyspnea certainly could be multifactorial but likely is due to Afib with RVR.   # Afib with RVR: Controlled - history of paroxysmal afib and anticoagulation treatment with pradaxa, but currently not anticoagulated. CHADS2-VASc score 4 (if reported EF of 55 is correct), 4% yearly risk of CVA. - CXR without evidence of pneumonia or fluid overload. She does not appear to be fluid overloaded on exam.  - telemetry and continuous pulse ox  - continue diltiazem po - daily ASA 81mg   - LDL 155, no statin as age >21 - Mag 1.5, give 2g bolus today  - K 2.5 -> 3.7 -> (this am pending) - Echo pending - Cardiology consulted regarding echo, anticoagulation, setting up with outpatient - appreciate recommendations.  - start xarelto for anticoag given CHADVASC 4  # COPD:  - previously finished 1 week steroids 2/23. - Some wheezes on exam, as well as crackles @ BL bases - continue symbicort and atrovent daily  - Q4 hr duonebs - supplemental O2 to keep sat >92%  - repeat CXR as worsening cough since admission  # ?AKI vs CKD: admission 1.18 (GFR 44), no prior results in EMR.  - continue to monitor with daily bmet,   # Hypokalemia:  - 3.1 -> 2.5 -> 3.7 -> pending - now s/p 5 runs of 10 mEq KCl  # hyperglycemia - glucose of 248-274, but A1c 5.7  #  Hypothyroidism:  - on synthroid 6mcg daily  - TSH 0.585  # Hypertension: on lasix 20mg  qday, hctz, diltiazem 120mg  24hr at home  - continue home lasix, hold hctz - continue dilt 240 qd   # Lung nodule: seen on admission xray - previously worked up, repeat PET scan scheduled at [facility]  FEN/GI: diet heart, fluids with dilt drip  Prophylaxis: on xarelto  Disposition: out to tele, home this pm vs tomorrow  Subjective:  Feeling better, dyspnea improved. Coughing more and sore throat, husband with same symptoms.  Objective: Temp:  [97.7 F (36.5 C)-98.2 F (36.8 C)] 97.7 F (36.5 C) (03/02 0819) Pulse Rate:  [73-88] 82 (03/02 0819) Resp:  [17-26] 22 (03/02 0819) BP: (88-115)/(48-71) 98/57 mmHg (03/02 0819) SpO2:  [93 %-96 %] 96 % (03/02 0819) Weight:  [175 lb 0.7 oz (79.4 kg)] 175 lb 0.7 oz (79.4 kg) (03/02 0500)  Physical Exam: Gen: NAD, alert, cooperative with exam HEENT: NCAT CV: RRR, good S1/S2, no murmur Resp: good air movement, crackles @ Bl Bases, no wheeze, course breath sounds throughout, non labored Abd: SNTND, BS present, no guarding or organomegaly Ext: No edema, warm, 2+ DP pulses, varicose veins Neuro: Alert and oriented, No gross deficits  Laboratory:  Recent Labs Lab 11/15/13 1941  WBC 9.5  HGB 13.2  HCT 39.7  PLT 206    Recent Labs Lab 11/15/13 1941 11/16/13 0117 11/16/13 1013  NA 140 145 138  K 3.1* 2.5* 3.7  CL 95*  96 92*  CO2 30 23 22   BUN 26* 31* 37*  CREATININE 1.18* 1.23* 1.21*  CALCIUM 9.1 8.9 9.2  GLUCOSE 124* 274* 248*    11/16/2013 01:17  Cholesterol 220 (H)  Triglycerides 58  HDL 53  LDL (calc) 155 (H)  VLDL 12  Total CHOL/HDL Ratio 4.2   Imaging/Diagnostic Tests: CXR 2/28: No acute cardiopulmonary disease.  Beverlyn Roux, MD 11/17/2013, 9:25 AM PGY-1, [place] Intern pager: [phone], text pages welcome

## 2013-11-17 NOTE — Progress Notes (Addendum)
Pt called feeling short of breath; upon assessment pt was breathing heavy using accessory muscles; states they are in no pain; O2 sats were 97-100% on 4L HR 80s upon auscultation crackles were heard on right side; notified MD of findings; no new orders given; pt is currently receiving breathing tx from respiratory; will continue to monitor pt; MD to visit pt later to assess.  Carollee Sires, RN

## 2013-11-17 NOTE — Progress Notes (Signed)
Report called to [facility], receiving RN on  2 west. VSS. Transferred to xray for 2view chest xray then patient transferring to 2w23 via bed with personal belongings. Family is at bedside with personal belongings  Toni [name]

## 2013-11-17 NOTE — Progress Notes (Signed)
Utilization review completed.  

## 2013-11-17 NOTE — Progress Notes (Signed)
Subjective: Sitting up in bed eating breakfast. No complaints. Denies CP, SOB, palpitations, dizziness.   Objective: Vital signs in last 24 hours: Temp:  [97.7 F (36.5 C)-98.2 F (36.8 C)] 97.7 F (36.5 C) (03/02 0721) Pulse Rate:  [73-88] 74 (03/02 0356) Resp:  [17-26] 22 (03/02 0356) BP: (104-115)/(48-56) 115/56 mmHg (03/02 0339) SpO2:  [93 %-96 %] 95 % (03/02 0758) Weight:  [175 lb 0.7 oz (79.4 kg)] 175 lb 0.7 oz (79.4 kg) (03/02 0500) Last BM Date: 11/16/13  Intake/Output from previous day: 03/01 0701 - 03/02 0700 In: 52 [P.O.:840; IV Piggyback:100] Out: 751 [Urine:750; Stool:1] Intake/Output this shift:    Medications Current Facility-Administered Medications  Medication Dose Route Frequency Provider Last Rate Last Dose  . ALPRAZolam Duanne Moron) tablet 0.5 mg  0.5 mg Oral BID PRN Angelica Ran, MD   0.5 mg at 11/16/13 2135  . aspirin EC tablet 81 mg  81 mg Oral Daily Angelica Ran, MD   81 mg at 11/16/13 1037  . budesonide-formoterol (SYMBICORT) 160-4.5 MCG/ACT inhaler 2 puff  2 puff Inhalation BID Angelica Ran, MD   2 puff at 11/17/13 0757  . diltiazem (CARDIZEM CD) 24 hr capsule 240 mg  240 mg Oral Daily Timmothy Euler, MD   240 mg at 11/16/13 1733  . docusate sodium (COLACE) capsule 100 mg  100 mg Oral Daily PRN Timmothy Euler, MD   100 mg at 11/16/13 1732  . furosemide (LASIX) tablet 20 mg  20 mg Oral Daily Angelica Ran, MD   20 mg at 11/16/13 1038  . heparin injection 5,000 Units  5,000 Units Subcutaneous 3 times per day Angelica Ran, MD   5,000 Units at 11/17/13 0612  . ipratropium-albuterol (DUONEB) 0.5-2.5 (3) MG/3ML nebulizer solution 3 mL  3 mL Nebulization Q4H Andrena Mews, MD   3 mL at 11/17/13 0756  . labetalol (NORMODYNE,TRANDATE) injection 10 mg  10 mg Intravenous Q2H PRN Tawanna Sat, MD      . levothyroxine (SYNTHROID, LEVOTHROID) tablet 75 mcg  75 mcg Oral QAC breakfast Angelica Ran, MD   75 mcg at 11/17/13  [phone]  . loratadine (CLARITIN) tablet 10 mg  10 mg Oral Daily Angelica Ran, MD   10 mg at 11/16/13 1037  . nitroGLYCERIN (NITROSTAT) SL tablet 0.4 mg  0.4 mg Sublingual Q5 Min x 3 PRN Angelica Ran, MD      . potassium chloride SA (K-DUR,KLOR-CON) CR tablet 20 mEq  20 mEq Oral BID Angelica Ran, MD   20 mEq at 11/16/13 2135    PE: General appearance: alert, cooperative and no distress Lungs: clear to auscultation bilaterally Heart: irregularly irregular rhythm Extremities: no LEE Pulses: 2+ and symmetric Skin: warm and dry Neurologic: Grossly normal  Lab Results:   Recent Labs  11/15/13 1941  WBC 9.5  HGB 13.2  HCT 39.7  PLT 206   BMET  Recent Labs  11/15/13 1941 11/16/13 0117 11/16/13 1013  NA 140 145 138  K 3.1* 2.5* 3.7  CL 95* 96 92*  CO2 30 23 22   GLUCOSE 124* 274* 248*  BUN 26* 31* 37*  CREATININE 1.18* 1.23* 1.21*  CALCIUM 9.1 8.9 9.2   PT/INR No results found for this basename: LABPROT, INR,  in the last 72 hours Cholesterol  Recent Labs  11/16/13 0117  CHOL 220*   Cardiac Panel (last 3 results)  Recent Labs  11/16/13 0017 11/16/13 0809 11/16/13 1234  TROPONINI <0.30 <  0.30 <0.30    Studies: 2D echo- pending  Assessment/Plan    Active Problems:   COPD (chronic obstructive pulmonary disease)   Atrial fibrillation with RVR   Atypical atrial flutter  Plan: Pt currently in atrial fibrillation with a controlled ventricular response. HR in the 80s.  Asymptomatic. She is now on PO Cardizem, 240 mg daily. BP is a bit soft but stable with most recent SBP of 98. Continue to monitor BP closely. With a CHADSVASC of 4, would recommend oral anticoagulant for stroke prophylaxis. TSH was checked yesterday and was WNL. Recommend f/u BMP to reassess potassium as she has been hypokalemic. If she continues to have hypokalemia, recommend magnesium supplementation. Although Mg WNL yesterday at 1.5, will need a level of ~2.0 in order to hold on  to potassium when repleted. MD to follow.     LOS: 2 days    Brittainy M. Ladoris Gene 11/17/2013 8:35 AM    Patient seen and examined. Agree with assessment and plan. HR now in the 53'Z, BP 98 systolic. Would continue current dose of Cardizem. Echo today. With elevated ChadsVasc scoere would recommend xarelto at reduced dose 15 mg daily with Cr Cl <50 or eliquis 5 mg bid for NOAC. No prior major bleeding. No chest pain. Scattered rhonchi on exam. Will follow.   Troy Sine, MD, [facility] 11/17/2013 8:52 AM

## 2013-11-17 NOTE — Progress Notes (Signed)
Respiratory therapist has concerns about pt's breathing; upon auscultation pt sounds more "wet"; crackles are more evident then previously heard; notified MD; waiting for returned page; will page again to follow up; pt just received breathing tx and is resting in bed; call bell within reach.  Carollee Sires, RN

## 2013-11-17 NOTE — Progress Notes (Signed)
Echocardiogram 2D Echocardiogram has been performed.  Toni [name] 11/17/2013, 9:53 AM

## 2013-11-17 NOTE — Progress Notes (Addendum)
Interim Progress Note  Patient is still having worsened dyspnea starting today, with intermittent relief from duoneb therapy and needing increased Oxygen by [place]. Denies chest pain. Reports mild chronic left shoulder pain.  O:  BP 108/53  Pulse 71  Temp(Src) 97.8 F (36.6 C) (Oral)  Resp 20  Ht 5\' 1"  (1.549 m)  Wt 175 lb 0.7 oz (79.4 kg)  BMI 33.09 kg/m2  SpO2 98% on 4L [place] NAD PULM: Increased expiratory phase, mild crackles bilateral bases and wheezing throughout, no cough EXTR: No LE edema or calf tenderness or erythema or cord palpated  A/P: Most likely cause is COPD with some relief with duonebs. Mild crackles but no h/o CHF or fever to suggest pneumonia. Consider viral illness as well. Consider PE but WELLS criteria 0.  - lasix 20mg  IV x 1 though no h/o CHF and today's echo normal EF - Continue duonebs and add prednisone 20mg  daily first dose now x 5 days. - No fever so will not order abx for now. - If continued dyspnea, consider eval for PE. - Coricidin cough suppressant that pt takes at home ordered for symptomatic relief.  [name], MD

## 2013-11-17 NOTE — Progress Notes (Signed)
FMTS Attending Note Patient seen and examined by me, discussed with resident team and I agree with Dr Quinn Plowman note as written with following additions.  Patient reports some improvement in dyspnea since admission, still on 5L/min supplemental oxygen, which is higher than the 2-3L/min that she uses at home.  She reports increased cough without increased sputum, no fevers.  Sick contact includes her husband, with cold symptoms.  She has had some sore throat beginning today. No nasal complaints (no rhinorrhea or nasal congestion).   The repeat CXR done today is not suggestive of pneumonia.  Suspect viral URI that has triggered a COPD exacerbation; no sputum production at this point.  Her AFib is now appropriately rate-controlled on oral diltiazem.  Given her persistent (though improved) dyspnea and increased oxygen needs, I favor keeping her in the hospital overnight rather than discharging today. Agree with renally adjusted Xarelto dosing to reduce risk of CVA in patient with AFib.  Dalbert Mayotte, MD

## 2013-11-17 NOTE — Progress Notes (Signed)
Scheduled breathing tx given at this time. Pt is using accessory muscles to breathe and states "this is the worst trouble I've had with my breathing". VS are stable at this time. BBS are clear in upper lobes and diminished with faint crackles at bases. RT will continue to monitor.

## 2013-11-17 NOTE — Discharge Instructions (Signed)
Rivaroxaban oral tablets What is this medicine? RIVAROXABAN (ri va ROX a ban) is an anticoagulant (blood thinner). It is used to treat blood clots in the lungs or in the veins. It is also used after knee or hip surgeries to prevent blood clots. It is also used to lower the chance of stroke in people with a medical condition called atrial fibrillation. This medicine may be used for other purposes; ask your health care provider or pharmacist if you have questions. COMMON BRAND NAME(S): Xarelto What should I tell my health care provider before I take this medicine? They need to know if you have any of these conditions: -bleeding disorders -bleeding in the brain -blood in your stools (black or tarry stools) or if you have blood in your vomit -history of stomach bleeding -kidney disease -liver disease -low blood counts, like low white cell, platelet, or red cell counts -recent or planned spinal or epidural procedure -take medicines that treat or prevent blood clots -an unusual or allergic reaction to rivaroxaban, other medicines, foods, dyes, or preservatives -pregnant or trying to get pregnant -breast-feeding How should I use this medicine? Take this medicine by mouth with a glass of water. Follow the directions on the prescription label. Take your medicine at regular intervals. Do not take it more often than directed. Do not stop taking except on your doctor's advice. Stopping this medicine may increase your risk of a blot clot. Be sure to refill your prescription before you run out of medicine. If you are taking this medicine after hip or knee replacement surgery, take it with or without food. If you are taking this medicine for atrial fibrillation, take it with your evening meal. If you are taking this medicine to treat blood clots, take it with food at the same time each day. If you are unable to swallow your tablet, you may crush the tablet and mix it in applesauce. Then, immediately eat the  applesauce. You should eat more food right after you eat the applesauce containing the crushed tablet. Talk to your pediatrician regarding the use of this medicine in children. Special care may be needed. Overdosage: If you think you have taken too much of this medicine contact a poison control center or emergency room at once. NOTE: This medicine is only for you. Do not share this medicine with others. What if I miss a dose? If you take your medicine once a day and miss a dose, take the missed dose as soon as you remember. If you take your medicine twice a day and miss a dose, take the missed dose immediately. In this instance, 2 tablets may be taken at the same time. The next day you should take 1 tablet twice a day as directed. What may interact with this medicine? -aspirin and aspirin-like medicines -certain antibiotics like erythromycin, azithromycin, and clarithromycin -certain medicines for fungal infections like ketoconazole and itraconazole -certain medicines for irregular heart beat like amiodarone, quinidine, dronedarone -certain medicines for seizures like carbamazepine, phenytoin -certain medicines that treat or prevent blood clots like warfarin, enoxaparin, and dalteparin  -conivaptan -diltiazem -felodipine -indinavir -lopinavir; ritonavir -NSAIDS, medicines for pain and inflammation, like ibuprofen or naproxen -ranolazine -rifampin -ritonavir -St. John's wort -verapamil This list may not describe all possible interactions. Give your health care provider a list of all the medicines, herbs, non-prescription drugs, or dietary supplements you use. Also tell them if you smoke, drink alcohol, or use illegal drugs. Some items may interact with your medicine. What should I  watch for while using this medicine? Visit your doctor or health care professional for regular checks on your progress. Your condition will be monitored carefully while you are receiving this medicine. Notify your  doctor or health care professional and seek emergency treatment if you develop breathing problems; changes in vision; chest pain; severe, sudden headache; pain, swelling, warmth in the leg; trouble speaking; sudden numbness or weakness of the face, arm, or leg. These can be signs that your condition has gotten worse. If you are going to have surgery, tell your doctor or health care professional that you are taking this medicine. Tell your health care professional that you use this medicine before you have a spinal or epidural procedure. Sometimes people who take this medicine have bleeding problems around the spine when they have a spinal or epidural procedure. This bleeding is very rare. If you have a spinal or epidural procedure while on this medicine, call your health care professional immediately if you have back pain, numbness or tingling (especially in your legs and feet), muscle weakness, paralysis, or loss of bladder or bowel control. Avoid sports and activities that might cause injury while you are using this medicine. Severe falls or injuries can cause unseen bleeding. Be careful when using sharp tools or knives. Consider using an Copy. Take special care brushing or flossing your teeth. Report any injuries, bruising, or red spots on the skin to your doctor or health care professional. What side effects may I notice from receiving this medicine? Side effects that you should report to your doctor or health care professional as soon as possible: -allergic reactions like skin rash, itching or hives, swelling of the face, lips, or tongue -back pain -redness, blistering, peeling or loosening of the skin, including inside the mouth -signs and symptoms of bleeding such as bloody or black, tarry stools; red or dark-brown urine; spitting up blood or brown material that looks like coffee grounds; red spots on the skin; unusual bruising or bleeding from the eye, gums, or nose  Side effects that  usually do not require medical attention (Report these to your doctor or health care professional if they continue or are bothersome.): -dizziness -muscle pain This list may not describe all possible side effects. Call your doctor for medical advice about side effects. You may report side effects to FDA at 1-800-FDA-1088. Where should I keep my medicine? Keep out of the reach of children. Store at room temperature between 15 and 30 degrees C (59 and 86 degrees F). Throw away any unused medicine after the expiration date. NOTE: This sheet is a summary. It may not cover all possible information. If you have questions about this medicine, talk to your doctor, pharmacist, or health care provider.  2014, [employer]vier/Gold Standard. (2013-02-19 09:51:31)

## 2013-11-18 ENCOUNTER — Inpatient Hospital Stay (HOSPITAL_COMMUNITY): Payer: Medicare Other

## 2013-11-18 DIAGNOSIS — J9600 Acute respiratory failure, unspecified whether with hypoxia or hypercapnia: Secondary | ICD-10-CM | POA: Diagnosis present

## 2013-11-18 DIAGNOSIS — J441 Chronic obstructive pulmonary disease with (acute) exacerbation: Secondary | ICD-10-CM

## 2013-11-18 LAB — BASIC METABOLIC PANEL
BUN: 46 mg/dL — ABNORMAL HIGH (ref 6–23)
CO2: 32 meq/L (ref 19–32)
Calcium: 9.1 mg/dL (ref 8.4–10.5)
Chloride: 97 mEq/L (ref 96–112)
Creatinine, Ser: 1.2 mg/dL — ABNORMAL HIGH (ref 0.50–1.10)
GFR calc Af Amer: 50 mL/min — ABNORMAL LOW (ref >90)
GFR calc non Af Amer: 43 mL/min — ABNORMAL LOW (ref >90)
GLUCOSE: 100 mg/dL — AB (ref 70–99)
POTASSIUM: 3.9 meq/L (ref 3.7–5.3)
SODIUM: 142 meq/L (ref 137–147)

## 2013-11-18 LAB — CBC
HCT: 36.3 % (ref 36.0–46.0)
Hemoglobin: 11.7 g/dL — ABNORMAL LOW (ref 12.0–15.0)
MCH: 29.2 pg (ref 26.0–34.0)
MCHC: 32.2 g/dL (ref 30.0–36.0)
MCV: 90.5 fL (ref 78.0–100.0)
Platelets: 205 10*3/uL (ref 150–400)
RBC: 4.01 MIL/uL (ref 3.87–5.11)
RDW: 17.1 % — AB (ref 11.5–15.5)
WBC: 11.7 10*3/uL — AB (ref 4.0–10.5)

## 2013-11-18 LAB — GLUCOSE, CAPILLARY
Glucose-Capillary: 98 mg/dL (ref 70–99)
Glucose-Capillary: 175 mg/dL — ABNORMAL HIGH (ref 70–99)
Glucose-Capillary: 201 mg/dL — ABNORMAL HIGH (ref 70–99)

## 2013-11-18 LAB — MAGNESIUM: MAGNESIUM: 2 mg/dL (ref 1.5–2.5)

## 2013-11-18 MED ORDER — METHYLPREDNISOLONE SODIUM SUCC 125 MG IJ SOLR
80.0000 mg | Freq: Every day | INTRAMUSCULAR | Status: DC
  Administered 2013-11-18: 80 mg via INTRAVENOUS
  Filled 2013-11-18: qty 1.28

## 2013-11-18 MED ORDER — BUDESONIDE 0.5 MG/2ML IN SUSP
0.5000 mg | Freq: Two times a day (BID) | RESPIRATORY_TRACT | Status: DC
  Administered 2013-11-18: 0.5 mg via RESPIRATORY_TRACT
  Filled 2013-11-18 (×3): qty 2

## 2013-11-18 MED ORDER — VANCOMYCIN HCL IN DEXTROSE 1-5 GM/200ML-% IV SOLN
1000.0000 mg | INTRAVENOUS | Status: DC
Start: 2013-11-18 — End: 2013-11-19
  Administered 2013-11-18: 1000 mg via INTRAVENOUS
  Filled 2013-11-18 (×4): qty 200

## 2013-11-18 MED ORDER — ALBUTEROL SULFATE (2.5 MG/3ML) 0.083% IN NEBU: 2.5000 mg | INHALATION_SOLUTION | RESPIRATORY_TRACT | Status: DC

## 2013-11-18 MED ORDER — IPRATROPIUM-ALBUTEROL 0.5-2.5 (3) MG/3ML IN SOLN
3.0000 mL | RESPIRATORY_TRACT | Status: DC
  Administered 2013-11-18 – 2013-11-21 (×18): 3 mL via RESPIRATORY_TRACT
  Filled 2013-11-18 (×18): qty 3

## 2013-11-18 MED ORDER — DEXTROSE 5 % IV SOLN
2.0000 g | INTRAVENOUS | Status: DC
  Administered 2013-11-18: 2 g via INTRAVENOUS
  Filled 2013-11-18 (×4): qty 2

## 2013-11-18 MED ORDER — IPRATROPIUM BROMIDE 0.02 % IN SOLN: 0.5000 mg | RESPIRATORY_TRACT | Status: DC

## 2013-11-18 MED ORDER — ARFORMOTEROL TARTRATE 15 MCG/2ML IN NEBU
15.0000 ug | INHALATION_SOLUTION | Freq: Two times a day (BID) | RESPIRATORY_TRACT | Status: DC
  Administered 2013-11-18: 15 ug via RESPIRATORY_TRACT
  Filled 2013-11-18 (×3): qty 2

## 2013-11-18 MED ORDER — LEVOTHYROXINE SODIUM 100 MCG IV SOLR
37.5000 ug | Freq: Every day | INTRAVENOUS | Status: DC
  Administered 2013-11-18: 37.5 ug via INTRAVENOUS
  Filled 2013-11-18 (×3): qty 5

## 2013-11-18 MED ORDER — INSULIN ASPART 100 UNIT/ML ~~LOC~~ SOLN
0.0000 [IU] | SUBCUTANEOUS | Status: DC
  Administered 2013-11-18: 3 [IU] via SUBCUTANEOUS
  Administered 2013-11-18: 4 [IU] via SUBCUTANEOUS
  Administered 2013-11-19 (×2): 3 [IU] via SUBCUTANEOUS

## 2013-11-18 MED ORDER — MAGIC MOUTHWASH
5.0000 mL | Freq: Three times a day (TID) | ORAL | Status: DC | PRN
  Administered 2013-11-18: 5 mL via ORAL
  Filled 2013-11-18: qty 5

## 2013-11-18 MED ORDER — METHYLPREDNISOLONE SODIUM SUCC 125 MG IJ SOLR
60.0000 mg | Freq: Four times a day (QID) | INTRAMUSCULAR | Status: DC
  Administered 2013-11-18 – 2013-11-20 (×8): 60 mg via INTRAVENOUS
  Filled 2013-11-18 (×13): qty 0.96

## 2013-11-18 NOTE — Consult Note (Signed)
Name: Toni [name] MRN: [id number] DOB: 12/31/1934    ADMISSION DATE:  11/15/2013 CONSULTATION DATE:  11/18/2013  REFERRING MD :  Tawanna Sat  CHIEF COMPLAINT:  Short of breath  BRIEF PATIENT DESCRIPTION:  79 yo female former smoker admitted with dyspnea and palpitations.  Found to have A fib with RVR.  Has hx of COPD.  She developed acute worsening of her dyspnea on 3/02, and transferred to ICU 3/03.   SIGNIFICANT EVENTS: 2/28 Admit, A fib RVR 3/01 Cardiology consulted  3/02 Dyspnea >> lasix, steroids, nebs 3/03 Transfer to ICU  STUDIES:  3/02 Echo >> EF 55 to 60%, mild AS, mild MR, mod LA/RA dilation, PAS 32 mmHg  LINES / TUBES: PIV  CULTURES: None  ANTIBIOTICS: Vancomycin 3/03 >> Cefepime 3/03 >>   HISTORY OF PRESENT ILLNESS:   79 yo female former smoker admitted with dyspnea and palpitations.  Found to have A fib with RVR.  Has hx of COPD.  She developed acute worsening of her dyspnea on 3/02, and transferred to ICU 3/03.   She feels tight in her chest and has trouble getting air in.  She denies chest congestion, but has wheezing.  She denies chest pain, abdominal pain, or nausea.  PAST MEDICAL HISTORY :  Past Medical History  Diagnosis Date  . Cataract   . COPD (chronic obstructive pulmonary disease)   . Essential hypertension, benign   . Hypothyroidism    Past Surgical History  Procedure Laterality Date  . Eye surgery     Prior to Admission medications   Medication Sig Start Date End Date Taking? Authorizing Provider  ALPRAZolam Duanne Moron) 0.5 MG tablet Take 0.5 mg by mouth 2 (two) times daily as needed for anxiety.   Yes Historical Provider, MD  aspirin (ASPIRIN EC) 81 MG EC tablet Take 81 mg by mouth daily. Swallow whole.   Yes Historical Provider, MD  budesonide (PULMICORT) 0.5 MG/2ML nebulizer solution Take 0.5 mg by nebulization 2 (two) times daily.   Yes Historical Provider, MD  budesonide-formoterol (SYMBICORT) 160-4.5 MCG/ACT inhaler Inhale 2 puffs  into the lungs 2 (two) times daily.   Yes Historical Provider, MD  formoterol (PERFOROMIST) 20 MCG/2ML nebulizer solution Take 20 mcg by nebulization 2 (two) times daily.   Yes Historical Provider, MD  furosemide (LASIX) 20 MG tablet Take 20 mg by mouth daily.   Yes Historical Provider, MD  hydrochlorothiazide (HYDRODIURIL) 25 MG tablet Take 25 mg by mouth daily.   Yes Historical Provider, MD  HYDROcodone-acetaminophen (NORCO/VICODIN) 5-325 MG per tablet Take 1 tablet by mouth every 6 (six) hours as needed for moderate pain.   Yes Historical Provider, MD  ipratropium (ATROVENT HFA) 17 MCG/ACT inhaler Inhale 2 puffs into the lungs every 6 (six) hours. wheezing   Yes Historical Provider, MD  levothyroxine (SYNTHROID, LEVOTHROID) 75 MCG tablet Take 75 mcg by mouth daily before breakfast.   Yes Historical Provider, MD  loratadine (CLARITIN) 10 MG tablet Take 10 mg by mouth daily.   Yes Historical Provider, MD  omeprazole (PRILOSEC) 20 MG capsule Take 20 mg by mouth daily.   Yes Historical Provider, MD  potassium chloride (K-DUR,KLOR-CON) 10 MEQ tablet Take 20 mEq by mouth 2 (two) times daily.   Yes Historical Provider, MD  diltiazem (TIAZAC) 240 MG 24 hr capsule Take 1 capsule (240 mg total) by mouth daily. 11/17/13   Beverlyn Roux, MD  Rivaroxaban (XARELTO) 15 MG TABS tablet Take 1 tablet (15 mg total) by mouth daily with supper.  11/17/13   Beverlyn Roux, MD   No Known Allergies  FAMILY HISTORY:  Family History  Problem Relation Age of Onset  . CAD Father   . Arrhythmia Sister    SOCIAL HISTORY:  reports that she quit smoking about 11 years ago. Her smoking use included Cigarettes. She smoked 0.00 packs per day. She has never used smokeless tobacco. She reports that she does not drink alcohol or use illicit drugs.  REVIEW OF SYSTEMS:   Negative except above.  SUBJECTIVE:   VITAL SIGNS: Temp:  [97.8 F (36.6 C)-98.5 F (36.9 C)] 98.5 F (36.9 C) (03/03 0334) Pulse Rate:  [71-87] 87 (03/03  1011) Resp:  [20] 20 (03/03 0334) BP: (108-131)/(53-63) 110/63 mmHg (03/03 1011) SpO2:  [98 %-100 %] 100 % (03/03 1056) Weight:  [174 lb 6.1 oz (79.1 kg)] 174 lb 6.1 oz (79.1 kg) (03/03 0334) HEMODYNAMICS:   VENTILATOR SETTINGS:   INTAKE / OUTPUT: Intake/Output     03/02 0701 - 03/03 0700 03/03 0701 - 03/04 0700   P.O.     IV Piggyback 50    Total Intake(mL/kg) 50 (0.6)    Urine (mL/kg/hr) 1750 (0.9)    Stool     Total Output 1750     Net -1700            PHYSICAL EXAMINATION: General: Ill appearing, using accessory muscles, pursed lip breathing pattern Neuro:  Alert, normal strength, moves all extremities HEENT:  No sinus tenderness, no LAB Cardiovascular:  Irregular, no murmur Lungs:  Decreased breath sounds, poor air movement, faint b/l expiratory wheezing Abdomen:  Soft, non tender, + bowel sounds Musculoskeletal:  No edema Skin:  No rashes  LABS:  CBC  Recent Labs Lab 11/15/13 1941  WBC 9.5  HGB 13.2  HCT 39.7  PLT 206   Coag's No results found for this basename: APTT, INR,  in the last 168 hours  BMET  Recent Labs Lab 11/16/13 1013 11/17/13 0822 11/18/13 0345  NA 138 138 142  K 3.7 4.2 3.9  CL 92* 97 97  CO2 22 28 32  BUN 37* 48* 46*  CREATININE 1.21* 1.20* 1.20*  GLUCOSE 248* 131* 100*   Electrolytes  Recent Labs Lab 11/16/13 0117 11/16/13 1013 11/17/13 0822 11/18/13 0345  CALCIUM 8.9 9.2 9.0 9.1  MG 1.5  --   --  2.0   Sepsis Markers No results found for this basename: LATICACIDVEN, PROCALCITON, O2SATVEN,  in the last 168 hours  ABG No results found for this basename: PHART, PCO2ART, PO2ART,  in the last 168 hours  Liver Enzymes No results found for this basename: AST, ALT, ALKPHOS, BILITOT, ALBUMIN,  in the last 168 hours  Cardiac Enzymes  Recent Labs Lab 11/15/13 1941 11/16/13 0017 11/16/13 0809 11/16/13 1234  TROPONINI  --  <0.30 <0.30 <0.30  PROBNP 2417.0*  --   --   --    Glucose No results found for this  basename: GLUCAP,  in the last 168 hours  Imaging Dg Chest 2 View  11/17/2013   CLINICAL DATA:  Cough, copd  EXAM: CHEST  2 VIEW  COMPARISON:  DG CHEST 1V PORT dated 11/15/2013  FINDINGS: The heart size and mediastinal contours are within normal limits. Atherosclerotic calcifications appreciated within aorta. Lungs are clear. There is increased AP diameter of the chest. The osseous structures demonstrate degenerative changes in the shoulders.  IMPRESSION: COPD changes without evidence of acute cardiopulmonary disease.   Electronically Signed   By: Margaree Mackintosh  M.D.   On: 11/17/2013 11:52    ASSESSMENT / PLAN:  PULMONARY A: Acute respiratory failure likely 2nd to AECOPD. P:   -transfer to ICU -trial on BiPAP >> may need intubation -scheduled BD's -solumedrol 60 mg q6h -f/u CXR  CARDIOVASCULAR A:  A fib with RVR. Hx of HTN. P:  -monitor hemodynamics -cardizem, xarelto per cardiology  RENAL A:   CKD. P:   -monitor renal fx, urine outpt, electrolytes  GASTROINTESTINAL A:   Nutrition. P:   -NPO while on BiPAP  HEMATOLOGIC A:   No acute issues. P:  -f/u CBC  INFECTIOUS A:   ?PNA >> not evident on clinical exam. P:   -f/u CXR -vancomycin, cefepime ordered by teaching service >> if CXR negative, then de-escalate Abx soon  ENDOCRINE A:   Hx of hypothyroidism. Steroid induced hyperglycemia.   P:   -IV levothyroxine for now -add SSI 3/03  NEUROLOGIC A:   No acute issues. P:   -Monitor mental status  TODAY'S SUMMARY:  Likely AECOPD as cause of distress.  Will transfer to ICU for trial of BiPAP.    CC time 35 minutes.  Chesley Mires, MD [facility] Pulmonary/Critical Care 11/18/2013, 11:36 AM Pager:  [phone] After 3pm call: [phone]

## 2013-11-18 NOTE — Progress Notes (Signed)
ANTIBIOTIC CONSULT NOTE - INITIAL  Pharmacy Consult for Vancomycin and Cefepime Indication: HCAP  No Known Allergies  Patient Measurements: Height: 5\' 1"  (154.9 cm) Weight: 174 lb 6.1 oz (79.1 kg) IBW/kg (Calculated) : 47.8  Vital Signs: Temp: 98.5 F (36.9 C) (03/03 0334) Temp src: Oral (03/03 0334) BP: 110/63 mmHg (03/03 1011) Pulse Rate: 87 (03/03 1011) Intake/Output from previous day: 03/02 0701 - 03/03 0700 In: 50 [IV Piggyback:50] Out: 1750 [[id number]] Intake/Output from this shift:    Labs:  Recent Labs  11/15/13 1941  11/16/13 1013 11/17/13 0822 11/18/13 0345  WBC 9.5  --   --   --   --   HGB 13.2  --   --   --   --   PLT 206  --   --   --   --   CREATININE 1.18*  < > 1.21* 1.20* 1.20*  < > = values in this interval not displayed. Estimated Creatinine Clearance: 38 ml/min (by C-G formula based on Cr of 1.2). No results found for this basename: VANCOTROUGH, Corlis Leak, VANCORANDOM, [place], GENTPEAK, GENTRANDOM, TOBRATROUGH, TOBRAPEAK, TOBRARND, AMIKACINPEAK, AMIKACINTROU, AMIKACIN,  in the last 72 hours   Microbiology: Recent Results (from the past 720 hour(s))  MRSA PCR SCREENING     Status: None   Collection Time    11/15/13 11:58 PM      Result Value Ref Range Status   MRSA by PCR NEGATIVE  NEGATIVE Final   Comment:            The GeneXpert MRSA Assay (FDA     approved for NASAL specimens     only), is one component of a     comprehensive MRSA colonization     surveillance program. It is not     intended to diagnose MRSA     infection nor to guide or     monitor treatment for     MRSA infections.    Medical History: Past Medical History  Diagnosis Date  . Cataract   . COPD (chronic obstructive pulmonary disease)   . Essential hypertension, benign   . Hypothyroidism     Medications:  Anti-infectives   None     Assessment: 79 year old female admitted with shortness of breath and atrial fibrillation with RVR.  She has a history of  COPD.  She is to begin empiric antibiotic therapy with Vancomycin and Cefepime for possible HCAP.  Will adjust for renal insufficiency.  Goal of Therapy:  Vancomycin trough level 15-20 mcg/ml  Plan:  Vancomycin 1gm IV q24h Cefepime 2gm IV q24h Monitor renal function Anticipate checking Vancomycin trough at steady state  Legrand Como, Pharm.D., BCPS, AAHIVP Clinical Pharmacist Phone: [phone] or [phone] 11/18/2013, 11:00 AM

## 2013-11-18 NOTE — Significant Event (Signed)
Rapid Response Event Note  Overview: Time Called: 1023 Arrival Time: 1028 Event Type: Respiratory  Initial Focused Assessment: Patient with increased WOB, Expiratory wheezes through out, with air movement Patient states this is the worst her breathing has ever felt BP 110/63  HR 87  RR 32  O2 sat 100% on 4L [place]  Interventions: Patient placed on Bipap Transferred to 2M10 via bed with O2 via Bipap and Heart monitor  Event Summary: Name of Physician Notified: Teaching service resident at bedside at    Name of Consulting Physician Notified: Dr Halford Chessman at 1040  Outcome: Transferred (Comment) (2M10)     Raliegh Ip

## 2013-11-18 NOTE — Progress Notes (Signed)
Family Medicine Teaching Service Daily Progress Note Intern Pager: [phone]  Patient name: Toni [name] Medical record number: [id number] Date of birth: [birth year] Age: 79 y.o. Gender: female  Primary Care Provider: Tomma Rakers, PA-C Consultants: Cardiology Code Status: Full  Pt Overview and Major Events to Date:  2/28- admitted with Afib RVR started on diltiazem drip 3/1- dyspnea improved  Assessment and Plan: Toni [name] is a 79 y.o. female presenting with palpatations and shortness of breath found to be in atrial fibrillation with RVR. PMH is significant for COPD, paroxysmal AF (not on anticoag), hypertension, hypothyroidism. Etiology of dyspnea certainly could be multifactorial but likely is due to Afib with RVR.   # Afib with RVR: Controlled - history of paroxysmal afib and anticoagulation treatment with pradaxa, but currently not anticoagulated. CHADS2-VASc score 4 (if reported EF of 55 is correct), 4% yearly risk of CVA. - CXR without evidence of pneumonia or fluid overload. She does not appear to be fluid overloaded on exam.  - telemetry and continuous pulse ox  - continue diltiazem po - daily ASA 81mg   - LDL 155, no statin as age >45 - Mag 1.5, s/p 2g bolus 3/2 - Echo: EF 55-60% - Cardiology consulted regarding echo, anticoagulation, setting up with outpatient - appreciate recommendations.  - start xarelto for anticoag given CHADVASC 4  # COPD: previously finished 1 week steroids 2/23. Repeat CXR 3/2 with no evidence of infiltrate, just COPD changes2  - Pt refused prednisone last night, ordered for 80mg  IV solumedrol this morning - atrovent daily, switch symbicort inhaler to budesonide/arformoterol nebulizer BID - Q4 hr duonebs - supplemental O2 to keep sat >92%  - if clinically worsens/spikes fever will repeat CXR  # ?AKI vs CKD: admission 1.18 (GFR 44), no prior results in EMR.  - continue to monitor with daily bmet  # Hypokalemia, resolved:  - 3.1 -> 2.5 ->  3.7 -> 4.2 -> 3.9 this AM - Will continue to monitor  # Hyperglycemia - glucose of 100 this AM, but A1c 5.7  # Hypothyroidism:  - on synthroid 35mcg daily  - TSH 0.585  # Hypertension: on lasix 20mg  qday, hctz, diltiazem 120mg  24hr at home  - continue home lasix, hold hctz - continue dilt 240 qd   # Lung nodule: seen on admission xray - previously worked up, repeat PET scan scheduled at [facility]  FEN/GI: diet heart, saline lock iv Prophylaxis: on xarelto  Disposition: pending improvement of respiratory status  Subjective:  Pt complains of worsened SOB overnight and this morning. SOB persists despite breathing treatments and increased oxygen. She denies any pain, fever/chills, does endorse worsening cough.  Objective: Temp:  [97.7 F (36.5 C)-98.5 F (36.9 C)] 98.5 F (36.9 C) (03/03 0334) Pulse Rate:  [71-82] 79 (03/03 0334) Resp:  [19-22] 20 (03/03 0334) BP: (98-131)/(51-59) 131/59 mmHg (03/03 0334) SpO2:  [95 %-99 %] 98 % (03/03 0334) Weight:  [174 lb 6.1 oz (79.1 kg)] 174 lb 6.1 oz (79.1 kg) (03/03 0334)  Physical Exam: Gen: mild respiratory distress this morning, alert, with nebulizer mask on receiving treatment HEENT: NCAT CV: RRR, good S1/S2, no murmur Resp: Moderate wheezes throughout all lung fields, increased WOB but no accessory muscle use. Abd: SNTND, BS present, no guarding or organomegaly Ext: No edema, warm, 2+ DP pulses, varicose veins Neuro: Alert and oriented, No gross deficits  Laboratory:  Recent Labs Lab 11/15/13 1941  WBC 9.5  HGB 13.2  HCT 39.7  PLT 206    Recent Labs  Lab 11/16/13 1013 11/17/13 0822 11/18/13 0345  NA 138 138 142  K 3.7 4.2 3.9  CL 92* 97 97  CO2 22 28 32  BUN 37* 48* 46*  CREATININE 1.21* 1.20* 1.20*  CALCIUM 9.2 9.0 9.1  GLUCOSE 248* 131* 100*    11/16/2013 01:17  Cholesterol 220 (H)  Triglycerides 58  HDL 53  LDL (calc) 155 (H)  VLDL 12  Total CHOL/HDL Ratio 4.2   Imaging/Diagnostic Tests: CXR 2/28:  No acute cardiopulmonary disease.  Tawanna Sat, MD 11/18/2013, 7:32 AM PGY-1, [place] Intern pager: [phone], text pages welcome

## 2013-11-18 NOTE — Progress Notes (Signed)
Patient Name: Toni [name] Date of Encounter: 11/18/2013  Active Problems:   COPD (chronic obstructive pulmonary disease)   Atrial fibrillation with RVR   Atypical atrial flutter    SUBJECTIVE: Very SOB, wheezing a great deal, she deals with this at home at times.   OBJECTIVE Filed Vitals:   11/17/13 1934 11/17/13 1935 11/17/13 2340 11/18/13 0334  BP:  108/53  131/59  Pulse:  71  79  Temp:  97.8 F (36.6 C)  98.5 F (36.9 C)  TempSrc:  Oral  Oral  Resp:  20  20  Height:      Weight:    174 lb 6.1 oz (79.1 kg)  SpO2: 98% 98% 99% 98%    Intake/Output Summary (Last 24 hours) at 11/18/13 0830 Last data filed at 11/18/13 0335  Gross per 24 hour  Intake     50 ml  Output   1750 ml  Net  -1700 ml   Filed Weights   11/16/13 0134 11/17/13 0500 11/18/13 0334  Weight: 171 lb 11.8 oz (77.9 kg) 175 lb 0.7 oz (79.4 kg) 174 lb 6.1 oz (79.1 kg)    PHYSICAL EXAM General: Well developed, well nourished, female with labored resp. Head: Normocephalic, atraumatic.  Neck: Supple without bruits, JVD at 8 cm. Lungs:  Resp regular and labored, tight, with insp/exp wheeze and some rales Heart: RRR, S1, S2, no S3, S4, or murmur; no rub. Abdomen: Soft, non-tender, non-distended, BS + x 4.  Extremities: No clubbing, cyanosis, no edema.  Neuro: Alert and oriented X 3. Moves all extremities spontaneously. Psych: Normal affect.  LABS: CBC: Recent Labs  11/15/13 1941  WBC 9.5  HGB 13.2  HCT 39.7  MCV 89.0  PLT 419   Basic Metabolic Panel: Recent Labs  11/16/13 0117  11/17/13 0822 11/18/13 0345  NA 145  < > 138 142  K 2.5*  < > 4.2 3.9  CL 96  < > 97 97  CO2 23  < > 28 32  GLUCOSE 274*  < > 131* 100*  BUN 31*  < > 48* 46*  CREATININE 1.23*  < > 1.20* 1.20*  CALCIUM 8.9  < > 9.0 9.1  MG 1.5  --   --  2.0  < > = values in this interval not displayed.  Cardiac Enzymes: Recent Labs  11/16/13 0017 11/16/13 0809 11/16/13 1234  TROPONINI <0.30 <0.30 <0.30    Recent  Labs  11/15/13 1947  TROPIPOC 0.03   BNP: Pro B Natriuretic peptide (BNP)  Date/Time Value Ref Range Status  11/15/2013  7:41 PM 2417.0* 0 - 450 pg/mL Final   Hemoglobin A1C: Recent Labs  11/16/13 0117  HGBA1C 5.7*   Fasting Lipid Panel: Recent Labs  11/16/13 0117  CHOL 220*  HDL 53  LDLCALC 155*  TRIG 58  CHOLHDL 4.2   Thyroid Function Tests: Recent Labs  11/16/13 0117  TSH 0.585   TELE:  SR, brief PAT/PAF, occ PVCs     Radiology/Studies: Dg Chest 2 View 11/17/2013   CLINICAL DATA:  Cough, copd  EXAM: CHEST  2 VIEW  COMPARISON:  DG CHEST 1V PORT dated 11/15/2013  FINDINGS: The heart size and mediastinal contours are within normal limits. Atherosclerotic calcifications appreciated within aorta. Lungs are clear. There is increased AP diameter of the chest. The osseous structures demonstrate degenerative changes in the shoulders.  IMPRESSION: COPD changes without evidence of acute cardiopulmonary disease.   Electronically Signed   By: Margaree Mackintosh  M.D.   On: 11/17/2013 11:52     Current Medications:  . arformoterol  15 mcg Nebulization Q12H  . aspirin EC  81 mg Oral Daily  . budesonide  0.5 mg Nebulization BID  . diltiazem  240 mg Oral Daily  . furosemide      . furosemide  20 mg Oral Daily  . ipratropium-albuterol  3 mL Nebulization Q4H  . levothyroxine  75 mcg Oral QAC breakfast  . loratadine  10 mg Oral Daily  . potassium chloride  20 mEq Oral BID  . predniSONE  20 mg Oral Q breakfast  . rivaroxaban  15 mg Oral Q supper      ASSESSMENT AND PLAN: Active Problems:   COPD (chronic obstructive pulmonary disease)   Atrial fibrillation with RVR - maintaining SR generally, brief run PAT/PAF this am, continue current Rx. On diltiazem and Xarelto, TSH OK.   Atypical atrial flutter   Hyperlipidemia - needs statin, will leave Rx to [facility] MD.   Signed, Rosaria Ferries , PA-C 8:30 AM 11/18/2013    Patient seen and examined. Agree with assessment and  plan. More dyspnea and wheezing today. Maintaining sinus rhythm with PAC's with HR in the 80's. Now on xarelto at 15 mg daily. On brochodilater therapy.   Troy Sine, MD, [facility] 11/18/2013 8:54 AM

## 2013-11-18 NOTE — Progress Notes (Signed)
FMTS Attending Note Patient seen and examined by me this morning and discussed with resident team.  I agree with Dr Marissa Calamity management and plan.  Patient has had progressively worsening dyspnea since yesterday evening. Mild clinical response to nebulized bronchodilator is short-lived.  She declined oral prednisone that was ordered for her last night because of prior experience of 'jitteriness' with prednisone.  Her pulse oximetry has been in the mid to high-90%s on supplemental oxygen.  IV SoluMedrol and Vanc/cefipime ordered.  CCM consult; appreciate their consult. Dalbert Mayotte, MD

## 2013-11-19 DIAGNOSIS — J9600 Acute respiratory failure, unspecified whether with hypoxia or hypercapnia: Secondary | ICD-10-CM

## 2013-11-19 LAB — CBC
HCT: 35.7 % — ABNORMAL LOW (ref 36.0–46.0)
Hemoglobin: 11.5 g/dL — ABNORMAL LOW (ref 12.0–15.0)
MCH: 28.9 pg (ref 26.0–34.0)
MCHC: 32.2 g/dL (ref 30.0–36.0)
MCV: 89.7 fL (ref 78.0–100.0)
PLATELETS: 189 10*3/uL (ref 150–400)
RBC: 3.98 MIL/uL (ref 3.87–5.11)
RDW: 16.7 % — AB (ref 11.5–15.5)
WBC: 6 10*3/uL (ref 4.0–10.5)

## 2013-11-19 LAB — GLUCOSE, CAPILLARY
GLUCOSE-CAPILLARY: 116 mg/dL — AB (ref 70–99)
GLUCOSE-CAPILLARY: 119 mg/dL — AB (ref 70–99)
GLUCOSE-CAPILLARY: 181 mg/dL — AB (ref 70–99)
GLUCOSE-CAPILLARY: 160 mg/dL — AB (ref 70–99)
Glucose-Capillary: 127 mg/dL — ABNORMAL HIGH (ref 70–99)
Glucose-Capillary: 167 mg/dL — ABNORMAL HIGH (ref 70–99)

## 2013-11-19 LAB — BASIC METABOLIC PANEL
BUN: 31 mg/dL — AB (ref 6–23)
CALCIUM: 9 mg/dL (ref 8.4–10.5)
CO2: 31 mEq/L (ref 19–32)
Chloride: 99 mEq/L (ref 96–112)
Creatinine, Ser: 0.76 mg/dL (ref 0.50–1.10)
GFR calc non Af Amer: 80 mL/min — ABNORMAL LOW (ref >90)
GLUCOSE: 131 mg/dL — AB (ref 70–99)
POTASSIUM: 3.9 meq/L (ref 3.7–5.3)
Sodium: 143 mEq/L (ref 137–147)

## 2013-11-19 MED ORDER — INSULIN ASPART 100 UNIT/ML ~~LOC~~ SOLN
0.0000 [IU] | Freq: Every day | SUBCUTANEOUS | Status: DC
  Administered 2013-11-20: 2 [IU] via SUBCUTANEOUS

## 2013-11-19 MED ORDER — RIVAROXABAN 20 MG PO TABS
20.0000 mg | ORAL_TABLET | Freq: Every day | ORAL | Status: DC
  Administered 2013-11-19 – 2013-11-23 (×5): 20 mg via ORAL
  Filled 2013-11-19 (×6): qty 1

## 2013-11-19 MED ORDER — HYDROXYZINE HCL 50 MG PO TABS
50.0000 mg | ORAL_TABLET | Freq: Three times a day (TID) | ORAL | Status: DC | PRN
Start: 2013-11-19 — End: 2013-11-24
  Administered 2013-11-19 – 2013-11-23 (×8): 50 mg via ORAL
  Filled 2013-11-19 (×2): qty 1
  Filled 2013-11-19: qty 2
  Filled 2013-11-19 (×4): qty 1
  Filled 2013-11-19 (×2): qty 2

## 2013-11-19 MED ORDER — LEVOFLOXACIN 500 MG PO TABS
500.0000 mg | ORAL_TABLET | Freq: Every day | ORAL | Status: AC
  Administered 2013-11-19 – 2013-11-24 (×6): 500 mg via ORAL
  Filled 2013-11-19 (×7): qty 1

## 2013-11-19 MED ORDER — LEVOTHYROXINE SODIUM 75 MCG PO TABS
75.0000 ug | ORAL_TABLET | Freq: Every day | ORAL | Status: DC
  Administered 2013-11-19 – 2013-11-24 (×6): 75 ug via ORAL
  Filled 2013-11-19 (×7): qty 1

## 2013-11-19 MED ORDER — DOXYCYCLINE HYCLATE 100 MG PO TABS
100.0000 mg | ORAL_TABLET | Freq: Two times a day (BID) | ORAL | Status: DC
  Administered 2013-11-19: 100 mg via ORAL
  Filled 2013-11-19 (×3): qty 1

## 2013-11-19 MED ORDER — INSULIN ASPART 100 UNIT/ML ~~LOC~~ SOLN
0.0000 [IU] | Freq: Three times a day (TID) | SUBCUTANEOUS | Status: DC
  Administered 2013-11-19: 3 [IU] via SUBCUTANEOUS
  Administered 2013-11-20 – 2013-11-21 (×4): 2 [IU] via SUBCUTANEOUS
  Administered 2013-11-21: 3 [IU] via SUBCUTANEOUS
  Administered 2013-11-22: 2 [IU] via SUBCUTANEOUS

## 2013-11-19 NOTE — Progress Notes (Signed)
PULMONARY / CRITICAL CARE MEDICINE  Name: Toni [name] MRN: [id number] DOB: Jan 18, 1935    ADMISSION DATE:  11/15/2013 CONSULTATION DATE:  11/18/2013  REFERRING MD :  FPTS  CHIEF COMPLAINT:  Short of breath  BRIEF PATIENT DESCRIPTION:  79 yo former smoker with COPD admitted with dyspnea and palpitations on 2/28.  Found to have AF / RVR.  Developed acute worsening of dyspnea on 3/02, and transferred to ICU 3/03.   SIGNIFICANT EVENTS: 2/28  Admitted with AF/RVR 3/01  Cardiology consulted  3/02  Dyspnea >>> lasix, steroids, nebs 3/03 T ransfer to ICU for BiPAP  STUDIES:  3/02  Echo >> EF 55 to 60%, mild AS, mild MR, mod LA/RA dilation, PAS 32 mmHg  LINES / TUBES:  CULTURES:  ANTIBIOTICS: Vancomycin 3/03 >>> 3/4 Cefepime 3/03 >>> 3/4 Levaquin 3/4 >>>  INTERVAL HISTORY:   Feels her breathing is at baseline.  Off BiPAP.  Wants to eat.  VITAL SIGNS: Temp:  [97.5 F (36.4 C)-98.7 F (37.1 C)] 97.9 F (36.6 C) (03/04 0844) Pulse Rate:  [63-99] 74 (03/04 1000) Resp:  [15-38] 17 (03/04 1000) BP: (96-128)/(49-69) 121/61 mmHg (03/04 1000) SpO2:  [95 %-100 %] 99 % (03/04 1000) FiO2 (%):  [40 %] 40 % (03/04 0420) Weight:  [80.1 kg (176 lb 9.4 oz)] 80.1 kg (176 lb 9.4 oz) (03/04 0441)  HEMODYNAMICS:   VENTILATOR SETTINGS: Vent Mode:  [-] BIPAP FiO2 (%):  [40 %] 40 % Set Rate:  [10 bmp] 10 bmp PEEP:  [6 cmH20] 6 cmH20  INTAKE / OUTPUT: Intake/Output     03/03 0701 - 03/04 0700 03/04 0701 - 03/05 0700   IV Piggyback 250    Total Intake(mL/kg) 250 (3.1)    Urine (mL/kg/hr) 1270 (0.7) 275 (1.1)   Total Output 1270 275   Net -1020 -275          PHYSICAL EXAMINATION: General: No distress, no use of accessory muscled Neuro:  Awake, alert HEENT:  PERRL, moist membranes Cardiovascular:  Irregular, no murmur Lungs:  Diminished breath sounds, expiratory wheezing Abdomen:  Soft, non tender, normal bowel sounds Musculoskeletal:  No edema Skin:  No  rashes  LABS:  CBC  Recent Labs Lab 11/15/13 1941 11/18/13 1200 11/19/13 0630  WBC 9.5 11.7* 6.0  HGB 13.2 11.7* 11.5*  HCT 39.7 36.3 35.7*  PLT 206 205 189   Coag's No results found for this basename: APTT, INR,  in the last 168 hours  BMET  Recent Labs Lab 11/17/13 0822 11/18/13 0345 11/19/13 0630  NA 138 142 143  K 4.2 3.9 3.9  CL 97 97 99  CO2 28 32 31  BUN 48* 46* 31*  CREATININE 1.20* 1.20* 0.76  GLUCOSE 131* 100* 131*   Electrolytes  Recent Labs Lab 11/16/13 0117  11/17/13 0822 11/18/13 0345 11/19/13 0630  CALCIUM 8.9  < > 9.0 9.1 9.0  MG 1.5  --   --  2.0  --   < > = values in this interval not displayed. Sepsis Markers No results found for this basename: LATICACIDVEN, PROCALCITON, O2SATVEN,  in the last 168 hours  ABG No results found for this basename: PHART, PCO2ART, PO2ART,  in the last 168 hours  Liver Enzymes No results found for this basename: AST, ALT, ALKPHOS, BILITOT, ALBUMIN,  in the last 168 hours  Cardiac Enzymes  Recent Labs Lab 11/15/13 1941 11/16/13 0017 11/16/13 0809 11/16/13 1234  TROPONINI  --  <0.30 <0.30 <0.30  PROBNP 2417.0*  --   --   --  Glucose  Recent Labs Lab 11/18/13 1203 11/18/13 1542 11/18/13 1853 11/18/13 2356 11/19/13 0415 11/19/13 0803  GLUCAP 98 175* 201* 167* 127* 116*   IMAGING:  Dg Chest 2 View  11/17/2013   CLINICAL DATA:  Cough, copd  EXAM: CHEST  2 VIEW  COMPARISON:  DG CHEST 1V PORT dated 11/15/2013  FINDINGS: The heart size and mediastinal contours are within normal limits. Atherosclerotic calcifications appreciated within aorta. Lungs are clear. There is increased AP diameter of the chest. The osseous structures demonstrate degenerative changes in the shoulders.  IMPRESSION: COPD changes without evidence of acute cardiopulmonary disease.   Electronically Signed   By: Margaree Mackintosh M.D.   On: 11/17/2013 11:52   Dg Chest Port 1 View  11/18/2013   CLINICAL DATA:  Respiratory distress.   EXAM: PORTABLE CHEST - 1 VIEW  COMPARISON:  Chest x-ray 11/17/2013.  FINDINGS: Lung volumes are low. No acute consolidative airspace disease. Diffuse peribronchial cuffing. Pulmonary vasculature appears within normal limits. Heart size is borderline enlarged. The patient is rotated to the right on today's exam, resulting in distortion of the mediastinal contours and reduced diagnostic sensitivity and specificity for mediastinal pathology. Atherosclerosis in the thoracic aorta.  IMPRESSION: 1. Mild diffuse peribronchial cuffing may suggest bronchitis. 2. Atherosclerosis.   Electronically Signed   By: Vinnie Langton M.D.   On: 11/18/2013 12:16    ASSESSMENT / PLAN:  PULMONARY A: Acute respiratory failure in setting of COPD and AF/RVR AECOPD Less likely pneumonia P:   Goal SpO2>92 Supplemental oxygen Solumedrol 60 mg q6h Duoneb q4h BiPAP PRN  CARDIOVASCULAR A:  AF/RVR. Hx of HTN. P:  Per Cardiology Cardizem, xarelto per cardiology  RENAL A:   CKD P:   Trend BMP  GASTROINTESTINAL A:   Nutrition P:   Diet  HEMATOLOGIC A:   Therapeutic anticoagulation for AF/RVR  P:  Trend BMP  INFECTIOUS A:   Empirical abx for AECOPD Pneumonia less likely P:   Change abx to Levaquin  ENDOCRINE A:   Hypothyroidism Steroid induced hyperglycemia P:   Change SSI to ac$hs Synthroid  NEUROLOGIC A:   No acute issues P:   No intervention required  Transfer to SDU.  FMTS to assume care 3/5.  PCCM will follow as consultants.  I have personally obtained history, examined patient, evaluated and interpreted laboratory and imaging results, reviewed medical records, formulated assessment / plan and placed orders.  Doree Fudge, MD Pulmonary and [place] Pager: [phone]  11/19/2013, 10:33 AM

## 2013-11-19 NOTE — Progress Notes (Signed)
Family Medicine Teaching Service Daily Progress Note Intern Pager: [phone]  Patient name: Toni [name] Medical record number: [id number] Date of birth: 09-Sep-1935 Age: 79 y.o. Gender: female  Primary Care Provider: Tomma Rakers, PA-C Consultants: Cardiology Code Status: Full  Pt Overview and Major Events to Date:  2/28- admitted with Afib RVR started on diltiazem drip 3/1- dyspnea improved 3/2 - dyspnea worsened 3/3 - transferred to CCM for worsening respiratory status and placed on bipap  Assessment and Plan: Toni [name] is a 79 y.o. female presenting with palpatations and shortness of breath found to be in atrial fibrillation with RVR. PMH is significant for COPD, paroxysmal AF (not on anticoag), hypertension, hypothyroidism. Etiology of dyspnea certainly could be multifactorial but likely is due to Afib with RVR.   # COPD: previously finished 1 week steroids 2/23. Repeat CXR 3/2 with no evidence of infiltrate, just COPD changes - s/p 80mg  IV solumedrol - atrovent daily, switch symbicort inhaler to budesonide/arformoterol nebulizer BID - Q4 hr duonebs - supplemental O2 to keep sat >92%, currently on bipap  - care per CCM - appreciate their care of this patient - we will continue to follow along and will be happy to accept patient back when her condition improves  # Afib with RVR: Controlled - history of paroxysmal afib and anticoagulation treatment with pradaxa, but currently not anticoagulated. CHADS2-VASc score 4 (if reported EF of 55 is correct), 4% yearly risk of CVA. - CXR without evidence of pneumonia or fluid overload. She does not appear to be fluid overloaded on exam.  - telemetry and continuous pulse ox  - continue diltiazem po - daily ASA 81mg   - LDL 155, no statin as age >33 - Mag 1.5, s/p 2g bolus 3/2 - Echo: EF 55-60% - Cardiology consulted regarding echo, anticoagulation, setting up with outpatient - appreciate recommendations.  - on xarelto for anticoag  given CHADVASC 4  # ?AKI vs CKD: admission 1.18 (GFR 44), no prior results in EMR.  - continue to monitor with daily bmet  # Hypokalemia, resolved:  - 3.1 -> 2.5 -> 3.7 -> 4.2 -> 3.9 this AM - Will continue to monitor  # Hyperglycemia - glucose of 100 this AM, but A1c 5.7  # Hypothyroidism:  - on synthroid 74mcg daily  - TSH 0.585  # Hypertension: on lasix 20mg  qday, hctz, diltiazem 120mg  24hr at home  - continue home lasix, hold hctz - continue dilt 240 qd   # Lung nodule: seen on admission xray - previously worked up, repeat PET scan scheduled at [facility]  FEN/GI: diet heart, saline lock iv Prophylaxis: on xarelto  Disposition: pending improvement of respiratory status  Subjective: Feels much better this morning, breathing comfortably, happy to have bipap off  Objective: Temp:  [97.5 F (36.4 C)-98.7 F (37.1 C)] 97.5 F (36.4 C) (03/04 0441) Pulse Rate:  [63-99] 69 (03/04 0600) Resp:  [15-38] 19 (03/04 0600) BP: (96-128)/(49-69) 121/61 mmHg (03/04 0600) SpO2:  [95 %-100 %] 99 % (03/04 0600) FiO2 (%):  [40 %] 40 % (03/04 0420) Weight:  [176 lb 9.4 oz (80.1 kg)] 176 lb 9.4 oz (80.1 kg) (03/04 0441)  Physical Exam: Gen: no respiratory distress this morning, alert, nasal canula in place HEENT: NCAT CV: RRR, good S1/S2, no murmur Resp: Moderate wheezes throughout all lung fields, normal WOB Abd: SNTND, BS present, no guarding or organomegaly Ext: No edema, warm, 2+ DP pulses, varicose veins Neuro: Alert and oriented, No gross deficits  Laboratory:  Recent Labs  Lab 11/15/13 1941 11/18/13 1200 11/19/13 0630  WBC 9.5 11.7* 6.0  HGB 13.2 11.7* 11.5*  HCT 39.7 36.3 35.7*  PLT 206 205 189    Recent Labs Lab 11/16/13 1013 11/17/13 0822 11/18/13 0345  NA 138 138 142  K 3.7 4.2 3.9  CL 92* 97 97  CO2 22 28 32  BUN 37* 48* 46*  CREATININE 1.21* 1.20* 1.20*  CALCIUM 9.2 9.0 9.1  GLUCOSE 248* 131* 100*    11/16/2013 01:17  Cholesterol 220 (H)   Triglycerides 58  HDL 53  LDL (calc) 155 (H)  VLDL 12  Total CHOL/HDL Ratio 4.2   Imaging/Diagnostic Tests: CXR 2/28: No acute cardiopulmonary disease. CXR 3/2: COPD changes without evidence of acute cardiopulmonary disease. CXR 3/3: 1. Mild diffuse peribronchial cuffing may suggest bronchitis. 2. Atherosclerosis.  Beverlyn Roux, MD 11/19/2013, 7:13 AM PGY-1, [place] Intern pager: [phone], text pages welcome

## 2013-11-19 NOTE — Progress Notes (Signed)
UR Completed.  Vergie Living [phone] 11/19/2013

## 2013-11-19 NOTE — Care Management Note (Addendum)
    Page 1 of 1   11/21/2013     3:41:02 PM   CARE MANAGEMENT NOTE 11/21/2013  Patient:  PAULITA, LICKLIDER   Account Number:  [id number]  Date Initiated:  11/19/2013  Documentation initiated by:  Luz Lex  Subjective/Objective Assessment:   Admitted with Afib - became resp distres requiring bipap     Action/Plan:   lives w husband, pcp dr Crista Luria   Anticipated DC Date:  11/24/2013   Anticipated DC Plan:  [place]  CM consult  Medication Assistance      Choice offered to / List presented to:             Status of service:  Completed, signed off Medicare Important Message given?   (If response is "NO", the following Medicare IM given date fields will be blank) Date Medicare IM given:   Date Additional Medicare IM given:    Discharge Disposition:    Per UR Regulation:  Reviewed for med. necessity/level of care/duration of stay  If discussed at [place] of Stay Meetings, dates discussed:   11/20/2013    Comments:  Contact:  [place],Bruce Spouse Meade Son [phone] 3/6 1539 debbie Samay Delcarlo rn,bsn spoke w [facility]. pt's copay for xarelto will be 91.93 per month. alerted pt and spouse.  3/4 1352 debbie Archibald Marchetta rn,bsn gave pt 30day free xarelto card. pt states she does have medicare d plan for meds.

## 2013-11-19 NOTE — Progress Notes (Signed)
ANTIBIOTIC CONSULT NOTE - FOLLOW UP  Pharmacy Consult for levaquin Indication: AECOPD  No Known Allergies  Patient Measurements: Height: 5\' 1"  (154.9 cm) Weight: 176 lb 9.4 oz (80.1 kg) IBW/kg (Calculated) : 47.8  Vital Signs: Temp: 97.5 F (36.4 C) (03/04 1100) Temp src: Oral (03/04 1100) BP: 121/53 mmHg (03/04 1100) Pulse Rate: 74 (03/04 1100) Intake/Output from previous day: 03/03 0701 - 03/04 0700 In: 250 [IV Piggyback:250] Out: 1270 [Urine:1270] Intake/Output from this shift: Total I/O In: -  Out: 275 [Urine:275]  Labs:  Recent Labs  11/17/13 0822 11/18/13 0345 11/18/13 1200 11/19/13 0630  WBC  --   --  11.7* 6.0  HGB  --   --  11.7* 11.5*  PLT  --   --  205 189  CREATININE 1.20* 1.20*  --  0.76   Estimated Creatinine Clearance: 57.3 ml/min (by C-G formula based on Cr of 0.76). No results found for this basename: VANCOTROUGH, Corlis Leak, VANCORANDOM, [place], GENTPEAK, GENTRANDOM, TOBRATROUGH, TOBRAPEAK, TOBRARND, AMIKACINPEAK, AMIKACINTROU, AMIKACIN,  in the last 72 hours   Microbiology: Recent Results (from the past 720 hour(s))  MRSA PCR SCREENING     Status: None   Collection Time    11/15/13 11:58 PM      Result Value Ref Range Status   MRSA by PCR NEGATIVE  NEGATIVE Final   Comment:            The GeneXpert MRSA Assay (FDA     approved for NASAL specimens     only), is one component of a     comprehensive MRSA colonization     surveillance program. It is not     intended to diagnose MRSA     infection nor to guide or     monitor treatment for     MRSA infections.    Anti-infectives   Start     Dose/Rate Route Frequency Ordered Stop   11/18/13 1300  vancomycin (VANCOCIN) IVPB 1000 mg/200 mL premix  Status:  Discontinued     1,000 mg 200 mL/hr over 60 Minutes Intravenous Every 24 hours 11/18/13 1108 11/19/13 1029   11/18/13 1200  ceFEPIme (MAXIPIME) 2 g in dextrose 5 % 50 mL IVPB  Status:  Discontinued     2 g 100 mL/hr over 30 Minutes  Intravenous Every 24 hours 11/18/13 1108 11/19/13 1029      Assessment: 79 yo female with AECOPD and antibiotics to change from cefepime/vanc to levaquin. WBC= 6, afeb, SCr= 0.72 and CrCl ~ 60.   Vanc 3/3>> 3/4 Cefepime 3/3>> 3/4 3/4 levaquin>>   Plan:  -Levaquin 500mg  po q24hr -consider defining length of therapy (? Seven days) -Will follow renal function and clinical progress  Hildred Laser, Pharm D 11/19/2013 11:42 AM

## 2013-11-19 NOTE — Progress Notes (Signed)
Pt transported in wheelchair on tele and O2 to 2H20.  Report called to [place], Therapist, sports.  Patient assisted to chair and placed on monitor in room, [place] at bedside.

## 2013-11-19 NOTE — Progress Notes (Signed)
    Subjective:  Denies CP; dyspnea with slight improvement   Objective:  Filed Vitals:   11/19/13 0800 11/19/13 0803 11/19/13 0844 11/19/13 0900  BP: 117/60   123/57  Pulse: 74     Temp:   97.9 F (36.6 C)   TempSrc:   Oral   Resp: 15   19  Height:      Weight:      SpO2: 98% 100%      Intake/Output from previous day:  Intake/Output Summary (Last 24 hours) at 11/19/13 0957 Last data filed at 11/19/13 0800  Gross per 24 hour  Intake    250 ml  Output   1345 ml  Net  -1095 ml    Physical Exam: Physical exam: Well-developed chronically ill appearing in no acute distress.  Skin is warm and dry.  HEENT is normal.  Neck is supple. Chest diminished BS throughout; diffuse exp wheeze Cardiovascular exam is regular rate and rhythm. Distant heart sounds Abdominal exam nontender or distended. No masses palpated. Extremities show no edema. neuro grossly intact    Lab Results: Basic Metabolic Panel:  Recent Labs  11/18/13 0345 11/19/13 0630  NA 142 143  K 3.9 3.9  CL 97 99  CO2 32 31  GLUCOSE 100* 131*  BUN 46* 31*  CREATININE 1.20* 0.76  CALCIUM 9.1 9.0  MG 2.0  --    CBC:  Recent Labs  11/18/13 1200 11/19/13 0630  WBC 11.7* 6.0  HGB 11.7* 11.5*  HCT 36.3 35.7*  MCV 90.5 89.7  PLT 205 189   Cardiac Enzymes:  Recent Labs  11/16/13 1234  TROPONINI <0.30     Assessment/Plan:  1. Atypical atrial flutter versus ectopic atrial tachycardia, initially presenting with RVR, now in sinus with first degree AV block. CHADSVASC score is 4. Continue cardizem and xeralto (GFR 79; change xeralto to 20 mg daily). 2. COPD/bronchitis. Continue antibiotics and bronchodilators; per CCM. 3. History of hypertension. BP controlled 4. AS-mild on echo; will need FU in the future.   Kirk Ruths 11/19/2013, 9:57 AM

## 2013-11-20 ENCOUNTER — Inpatient Hospital Stay (HOSPITAL_COMMUNITY): Payer: Medicare Other

## 2013-11-20 LAB — BASIC METABOLIC PANEL
BUN: 36 mg/dL — ABNORMAL HIGH (ref 6–23)
CO2: 30 meq/L (ref 19–32)
Calcium: 9.2 mg/dL (ref 8.4–10.5)
Chloride: 99 mEq/L (ref 96–112)
Creatinine, Ser: 0.83 mg/dL (ref 0.50–1.10)
GFR calc Af Amer: 77 mL/min — ABNORMAL LOW (ref >90)
GFR, EST NON AFRICAN AMERICAN: 67 mL/min — AB (ref >90)
Glucose, Bld: 140 mg/dL — ABNORMAL HIGH (ref 70–99)
POTASSIUM: 3.9 meq/L (ref 3.7–5.3)
SODIUM: 143 meq/L (ref 137–147)

## 2013-11-20 LAB — CBC
HCT: 37.1 % (ref 36.0–46.0)
HEMOGLOBIN: 12 g/dL (ref 12.0–15.0)
MCH: 29.1 pg (ref 26.0–34.0)
MCHC: 32.3 g/dL (ref 30.0–36.0)
MCV: 90 fL (ref 78.0–100.0)
Platelets: 229 10*3/uL (ref 150–400)
RBC: 4.12 MIL/uL (ref 3.87–5.11)
RDW: 16.6 % — ABNORMAL HIGH (ref 11.5–15.5)
WBC: 8.6 10*3/uL (ref 4.0–10.5)

## 2013-11-20 LAB — GLUCOSE, CAPILLARY
GLUCOSE-CAPILLARY: 131 mg/dL — AB (ref 70–99)
Glucose-Capillary: 123 mg/dL — ABNORMAL HIGH (ref 70–99)
Glucose-Capillary: 133 mg/dL — ABNORMAL HIGH (ref 70–99)
Glucose-Capillary: 222 mg/dL — ABNORMAL HIGH (ref 70–99)

## 2013-11-20 MED ORDER — IPRATROPIUM-ALBUTEROL 0.5-2.5 (3) MG/3ML IN SOLN: 3.0000 mL | RESPIRATORY_TRACT | Status: DC

## 2013-11-20 MED ORDER — TIOTROPIUM BROMIDE MONOHYDRATE 18 MCG IN CAPS
18.0000 ug | ORAL_CAPSULE | Freq: Every day | RESPIRATORY_TRACT | Status: DC
  Filled 2013-11-20: qty 5

## 2013-11-20 MED ORDER — BUDESONIDE 0.25 MG/2ML IN SUSP
0.2500 mg | Freq: Two times a day (BID) | RESPIRATORY_TRACT | Status: DC
  Administered 2013-11-20 – 2013-11-21 (×2): 0.25 mg via RESPIRATORY_TRACT
  Filled 2013-11-20 (×5): qty 2

## 2013-11-20 MED ORDER — PREDNISONE 50 MG PO TABS
50.0000 mg | ORAL_TABLET | Freq: Every day | ORAL | Status: DC
  Filled 2013-11-20 (×2): qty 1

## 2013-11-20 MED ORDER — PREDNISONE 20 MG PO TABS
40.0000 mg | ORAL_TABLET | Freq: Every day | ORAL | Status: DC
  Administered 2013-11-20 – 2013-11-21 (×2): 40 mg via ORAL
  Filled 2013-11-20 (×3): qty 2

## 2013-11-20 NOTE — Progress Notes (Signed)
    Subjective:  Denies CP; dyspnea improving   Objective:  Filed Vitals:   11/20/13 0329 11/20/13 0356 11/20/13 0755 11/20/13 0804  BP:  103/61 128/67   Pulse: 78 78  82  Temp: 97.6 F (36.4 C)   97.6 F (36.4 C)  TempSrc: Oral   Oral  Resp: 17 20 22    Height:      Weight: 180 lb 8.9 oz (81.9 kg)     SpO2: 100%  95%     Intake/Output from previous day:  Intake/Output Summary (Last 24 hours) at 11/20/13 3559 Last data filed at 11/19/13 1623  Gross per 24 hour  Intake      0 ml  Output    600 ml  Net   -600 ml    Physical Exam: Physical exam: Well-developed chronically ill appearing in no acute distress.  Skin is warm and dry.  HEENT is normal.  Neck is supple. Chest diminished BS throughout; diffuse exp wheeze but improved Cardiovascular exam is regular rate and rhythm. Distant heart sounds Abdominal exam nontender or distended. No masses palpated. Extremities show no edema. neuro grossly intact    Lab Results: Basic Metabolic Panel:  Recent Labs  11/18/13 0345 11/19/13 0630 11/20/13 0215  NA 142 143 143  K 3.9 3.9 3.9  CL 97 99 99  CO2 32 31 30  GLUCOSE 100* 131* 140*  BUN 46* 31* 36*  CREATININE 1.20* 0.76 0.83  CALCIUM 9.1 9.0 9.2  MG 2.0  --   --    CBC:  Recent Labs  11/19/13 0630 11/20/13 0215  WBC 6.0 8.6  HGB 11.5* 12.0  HCT 35.7* 37.1  MCV 89.7 90.0  PLT 189 229     Assessment/Plan:  1. Atypical atrial flutter versus ectopic atrial tachycardia, initially presenting with RVR; recurrent early AM but now in sinus with first degree AV block. CHADSVASC score is 4. Continue cardizem and xeralto (GFR 79; change xeralto to 20 mg daily). 2. COPD/bronchitis. Continue antibiotics and bronchodilators; per CCM. 3. History of hypertension. BP controlled 4. AS-mild on echo; will need FU in the future. Patient can be transferred to telemetry from a cardiac standpoint.  Kirk Ruths 11/20/2013, 9:03 AM

## 2013-11-20 NOTE — Progress Notes (Signed)
Patient's care discussed with resident team and I agree with Dr Quinn Plowman assessment and plan.  Dalbert Mayotte, MD

## 2013-11-20 NOTE — Progress Notes (Signed)
Family Medicine Teaching Service Daily Progress Note Intern Pager: [phone]  Patient name: Toni [name] Medical record number: [id number] Date of birth: 1935-02-16 Age: 79 y.o. Gender: female  Primary Care Provider: Tomma Rakers, PA-C Consultants: Cardiology Code Status: Full  Pt Overview and Major Events to Date:  2/28 - admitted with Afib RVR started on diltiazem drip 3/1 - dyspnea improved 3/2 - dyspnea worsened 3/3 - transferred to CCM for worsening respiratory status and placed on bipap 3/4 - respiratory status improved, transferred out to SDU  Assessment and Plan: Toni [name] is a 79 y.o. female presenting with palpatations and shortness of breath found to be in atrial fibrillation with RVR. PMH is significant for COPD, paroxysmal AF (not on anticoag), hypertension, hypothyroidism. Etiology of dyspnea certainly could be multifactorial but likely is due to Afib with RVR.   # COPD: previously finished 1 week steroids 2/23. Repeat CXRs with no evidence of infiltrate, just COPD changes - transition IV solumedrol to PO prednisone today - atrovent daily, switch symbicort inhaler to budesonide/arformoterol nebulizer BID - Q4 hr duonebs - supplemental O2 to keep sat >92%, currently on bipap  - continue PO levaquin for exacerbation  # Afib with RVR: Controlled - history of paroxysmal afib and anticoagulation treatment with pradaxa. CHADS2-VASc score 4 (if reported EF of 55 is correct), 4% yearly risk of CVA. - CXR without evidence of pneumonia or fluid overload. She does not appear to be fluid overloaded on exam.  - telemetry and continuous pulse ox  - continue diltiazem po - daily ASA 81mg   - LDL 155, no statin as age >47 - Mag 1.5, s/p 2g bolus 3/2 - Echo: EF 55-60% - Cardiology consulted regarding echo, anticoagulation, setting up with outpatient - appreciate recommendations.  - on xarelto for anticoag given CHADVASC 4  # AKI: admission 1.18 (GFR 44), now resolved creat  but BUN still slightly elevated (GFR 67) - continue to monitor with daily bmet  # Hypokalemia, resolved:  - stable at 3.9 this AM - Will continue to monitor  # Hyperglycemia - glucose of 140 this AM, but A1c 5.7  # Hypothyroidism:  - on synthroid 30mcg daily  - TSH 0.585  # Hypertension: on lasix 20mg  qday, hctz, diltiazem 120mg  24hr at home  - continue home lasix, hold hctz - continue dilt 240 qd   # Lung nodule: seen on admission xray - previously worked up, repeat PET scan scheduled at [facility]  FEN/GI: diet heart, saline lock iv Prophylaxis: on xarelto  Disposition: pending improvement of respiratory status, this evening vs. tomorrow  Subjective: Feels much better this morning, breathing comfortably, wants to go home  Objective: Temp:  [97.4 F (36.3 C)-97.9 F (36.6 C)] 97.6 F (36.4 C) (03/05 0329) Pulse Rate:  [74-115] 78 (03/05 0356) Resp:  [15-21] 20 (03/05 0356) BP: (103-129)/(52-71) 103/61 mmHg (03/05 0356) SpO2:  [98 %-100 %] 100 % (03/05 0329) Weight:  [180 lb 8.9 oz (81.9 kg)] 180 lb 8.9 oz (81.9 kg) (03/05 0329)  Physical Exam: Gen: no respiratory distress this morning, alert, nasal canula in place HEENT: NCAT CV: RRR, good S1/S2, no murmur Resp: End expiratory wheezes throughout all lung fields, improved air movement, normal WOB Abd: SNTND, no guarding or organomegaly Ext: No edema, warm, 2+ DP pulses, varicose veins Neuro: Alert and oriented, No gross deficits  Laboratory:  Recent Labs Lab 11/18/13 1200 11/19/13 0630 11/20/13 0215  WBC 11.7* 6.0 8.6  HGB 11.7* 11.5* 12.0  HCT 36.3 35.7* 37.1  PLT 205 189 229    Recent Labs Lab 11/18/13 0345 11/19/13 0630 11/20/13 0215  NA 142 143 143  K 3.9 3.9 3.9  CL 97 99 99  CO2 32 31 30  BUN 46* 31* 36*  CREATININE 1.20* 0.76 0.83  CALCIUM 9.1 9.0 9.2  GLUCOSE 100* 131* 140*    11/16/2013 01:17  Cholesterol 220 (H)  Triglycerides 58  HDL 53  LDL (calc) 155 (H)  VLDL 12  Total  CHOL/HDL Ratio 4.2   Imaging/Diagnostic Tests: CXR 2/28: No acute cardiopulmonary disease. CXR 3/2: COPD changes without evidence of acute cardiopulmonary disease. CXR 3/3: 1. Mild diffuse peribronchial cuffing may suggest bronchitis. 2. Atherosclerosis. CXR 3/5: Bilateral peribronchial thickening is again noted which is unchanged compared to prior exam. No other significant pulmonary abnormality is noted.   Beverlyn Roux, MD 11/20/2013, 7:19 AM PGY-1, [place] Intern pager: [phone], text pages welcome

## 2013-11-20 NOTE — Progress Notes (Signed)
PULMONARY / CRITICAL CARE MEDICINE  Name: Toni [name] MRN: [id number] DOB: 1935-05-23    ADMISSION DATE:  11/15/2013 CONSULTATION DATE:  11/18/2013  REFERRING MD :  FPTS  CHIEF COMPLAINT:  Short of breath  BRIEF PATIENT DESCRIPTION:  79 yo former smoker with COPD admitted with dyspnea and palpitations on 2/28.  Found to have AF / RVR.  Developed acute worsening of dyspnea on 3/02, and transferred to ICU 3/03.   SIGNIFICANT EVENTS: 2/28  Admitted with AF/RVR 3/01  Cardiology consulted  3/02  Dyspnea >>> lasix, steroids, nebs 3/03 T ransfer to ICU for BiPAP  STUDIES:  3/02  Echo >> EF 55 to 60%, mild AS, mild MR, mod LA/RA dilation, PAS 32 mmHg  LINES / TUBES:  CULTURES:  ANTIBIOTICS: Vancomycin 3/03 >>> 3/4 Cefepime 3/03 >>> 3/4 Levaquin 3/4 >>>  INTERVAL HISTORY:     11/19/13 : Feels her breathing is at baseline.  Off BiPAP.  Wants to eat.  11/20/13: feels better but admits is tired and deconditoned. On 3L [place]  VITAL SIGNS: Temp:  [97.4 F (36.3 C)-97.8 F (36.6 C)] 97.6 F (36.4 C) (03/05 0804) Pulse Rate:  [74-115] 80 (03/05 1000) Resp:  [17-22] 19 (03/05 1000) BP: (103-129)/(52-71) 128/67 mmHg (03/05 0755) SpO2:  [95 %-100 %] 100 % (03/05 1000) Weight:  [81.9 kg (180 lb 8.9 oz)] 81.9 kg (180 lb 8.9 oz) (03/05 0329)  HEMODYNAMICS:   VENTILATOR SETTINGS:    INTAKE / OUTPUT: Intake/Output     03/04 0701 - 03/05 0700 03/05 0701 - 03/06 0700   IV Piggyback     Total Intake(mL/kg)     Urine (mL/kg/hr) 675 (0.3)    Total Output 675     Net -675          Urine Occurrence 4 x 1 x     PHYSICAL EXAMINATION: General: No distress, no use of accessory muscled, DECONDITIONED Neuro:  Awake, alert HEENT:  PERRL, moist membranes Cardiovascular:  Irregular, no murmur Lungs:  Diminished breath sounds, NO WHEEZE Abdomen:  Soft, non tender, normal bowel sounds Musculoskeletal:  No edema Skin:  No rashes  LABS:  PULMONARY No results found for this basename: PHART,  PCO2, PCO2ART, PO2, PO2ART, HCO3, TCO2, O2SAT,  in the last 168 hours  CBC  Recent Labs Lab 11/18/13 1200 11/19/13 0630 11/20/13 0215  HGB 11.7* 11.5* 12.0  HCT 36.3 35.7* 37.1  WBC 11.7* 6.0 8.6  PLT 205 189 229    COAGULATION No results found for this basename: INR,  in the last 168 hours  CARDIAC   Recent Labs Lab 11/16/13 0017 11/16/13 0809 11/16/13 1234  TROPONINI <0.30 <0.30 <0.30    Recent Labs Lab 11/15/13 1941  PROBNP 2417.0*     CHEMISTRY  Recent Labs Lab 11/15/13 1941 11/16/13 0117 11/16/13 1013 11/17/13 0822 11/18/13 0345 11/19/13 0630 11/20/13 0215  NA 140 145 138 138 142 143 143  K 3.1* 2.5* 3.7 4.2 3.9 3.9 3.9  CL 95* 96 92* 97 97 99 99  CO2 30 23 22 28  32 31 30  GLUCOSE 124* 274* 248* 131* 100* 131* 140*  BUN 26* 31* 37* 48* 46* 31* 36*  CREATININE 1.18* 1.23* 1.21* 1.20* 1.20* 0.76 0.83  CALCIUM 9.1 8.9 9.2 9.0 9.1 9.0 9.2  MG  --  1.5  --   --  2.0  --   --    Estimated Creatinine Clearance: 55.9 ml/min (by C-G formula based on Cr of 0.83).   LIVER No  results found for this basename: AST, ALT, ALKPHOS, BILITOT, PROT, ALBUMIN, INR,  in the last 168 hours   INFECTIOUS No results found for this basename: LATICACIDVEN, PROCALCITON,  in the last 168 hours   ENDOCRINE CBG (last 3)   Recent Labs  11/19/13 1614 11/19/13 2123 11/20/13 0807  GLUCAP 181* 160* 123*         IMAGING x48h  Dg Chest Port 1 View  11/20/2013   CLINICAL DATA:  Shortness of breath, cough.  EXAM: PORTABLE CHEST - 1 VIEW  COMPARISON:  November 18, 2013.  FINDINGS: Stable cardiomediastinal silhouette. No pleural effusion or pneumothorax is noted. Stable mild bilateral peribronchial thickening is again noted. No significant airspace opacity is noted. Narrowing of the left acromial humeral space is noted suggesting rotator cuff injury.  IMPRESSION: Bilateral peribronchial thickening is again noted which is unchanged compared to prior exam. No other significant  pulmonary abnormality is noted.   Electronically Signed   By: Sabino Dick M.D.   On: 11/20/2013 09:14   Dg Chest Port 1 View  11/18/2013   CLINICAL DATA:  Respiratory distress.  EXAM: PORTABLE CHEST - 1 VIEW  COMPARISON:  Chest x-ray 11/17/2013.  FINDINGS: Lung volumes are low. No acute consolidative airspace disease. Diffuse peribronchial cuffing. Pulmonary vasculature appears within normal limits. Heart size is borderline enlarged. The patient is rotated to the right on today's exam, resulting in distortion of the mediastinal contours and reduced diagnostic sensitivity and specificity for mediastinal pathology. Atherosclerosis in the thoracic aorta.  IMPRESSION: 1. Mild diffuse peribronchial cuffing may suggest bronchitis. 2. Atherosclerosis.   Electronically Signed   By: Vinnie Langton M.D.   On: 11/18/2013 12:16     ASSESSMENT / PLAN:  PULMONARY A: Acute respiratory failure in setting of COPD and AF/RVR AECOPD Less likely pneumonia  - slowly improving  P:   NEEDS OPD PULMONARY FU WITH DR Lake Bells in [place] - cALL [phone] to set  up Goal SpO2>92 Supplemental oxygen Change IV to oral steroids and taper ove 8 days from 11/20/13 Duoneb q4h + pulmicort bid, DC spiriva (needs to go home on NEBS as opposed to MDI) given advanced COPD BiPAP PRN  CARDIOVASCULAR A:  AF/RVR. Hx of HTN. P:  Per Cardiology Cardizem, xarelto per cardiology  RENAL A:   CKD P:   Trend BMP  GASTROINTESTINAL A:   Nutrition P:   Diet  HEMATOLOGIC A:   Therapeutic anticoagulation for AF/RVR  P:  Trend BMP  INFECTIOUS A:   Empirical abx for AECOPD Pneumonia less likely P:   Change abx to Levaquin  ENDOCRINE A:   Hypothyroidism Steroid induced hyperglycemia P:   Change SSI to ac$hs Synthroid  NEUROLOGIC A:   No acute issues P:   No intervention required   GLOBAL 11/20/13: patient updated. No family at bedside. PCCM will follow. Needs opd fu with [place] [facility] at [facility]  with Dr Lake Bells   Dr. Brand Males, M.D., [facility].C.P Pulmonary and Critical Care Medicine Staff Physician [place] [facility] Pager: [phone], If no answer or between  15:00h - 7:00h: call [phone]  11/20/2013 10:31 AM

## 2013-11-21 LAB — GLUCOSE, CAPILLARY
GLUCOSE-CAPILLARY: 128 mg/dL — AB (ref 70–99)
GLUCOSE-CAPILLARY: 163 mg/dL — AB (ref 70–99)
Glucose-Capillary: 112 mg/dL — ABNORMAL HIGH (ref 70–99)
Glucose-Capillary: 147 mg/dL — ABNORMAL HIGH (ref 70–99)

## 2013-11-21 MED ORDER — PREDNISONE 20 MG PO TABS
30.0000 mg | ORAL_TABLET | Freq: Every day | ORAL | Status: DC
  Administered 2013-11-22 – 2013-11-24 (×3): 30 mg via ORAL
  Filled 2013-11-21 (×4): qty 1

## 2013-11-21 MED ORDER — BECLOMETHASONE DIPROPIONATE 80 MCG/ACT IN AERS
2.0000 | INHALATION_SPRAY | Freq: Two times a day (BID) | RESPIRATORY_TRACT | Status: DC
  Administered 2013-11-22 – 2013-11-24 (×4): 2 via RESPIRATORY_TRACT
  Filled 2013-11-21 (×3): qty 8.7

## 2013-11-21 MED ORDER — DILTIAZEM HCL ER BEADS 240 MG PO CP24: 240.0000 mg | ORAL_CAPSULE | Freq: Every day | ORAL | Status: DC

## 2013-11-21 MED ORDER — HYDROXYZINE HCL 50 MG PO TABS: 50.0000 mg | ORAL_TABLET | Freq: Three times a day (TID) | ORAL | Status: DC | PRN

## 2013-11-21 MED ORDER — ALBUTEROL SULFATE (2.5 MG/3ML) 0.083% IN NEBU: 2.5000 mg | INHALATION_SOLUTION | RESPIRATORY_TRACT | Status: DC | PRN

## 2013-11-21 MED ORDER — LEVOFLOXACIN 500 MG PO TABS: 500.0000 mg | ORAL_TABLET | Freq: Every day | ORAL | Status: DC

## 2013-11-21 MED ORDER — PREDNISONE 10 MG PO TABS: ORAL_TABLET | ORAL | Status: DC

## 2013-11-21 MED ORDER — BUDESONIDE 0.5 MG/2ML IN SUSP: 0.5000 mg | Freq: Two times a day (BID) | RESPIRATORY_TRACT | Status: DC

## 2013-11-21 MED ORDER — RIVAROXABAN 20 MG PO TABS: 20.0000 mg | ORAL_TABLET | Freq: Every day | ORAL | Status: DC

## 2013-11-21 MED ORDER — IPRATROPIUM-ALBUTEROL 0.5-2.5 (3) MG/3ML IN SOLN
3.0000 mL | Freq: Four times a day (QID) | RESPIRATORY_TRACT | Status: DC
  Administered 2013-11-21 – 2013-11-24 (×13): 3 mL via RESPIRATORY_TRACT
  Filled 2013-11-21 (×13): qty 3

## 2013-11-21 MED ORDER — FORMOTEROL FUMARATE 20 MCG/2ML IN NEBU: 20.0000 ug | INHALATION_SOLUTION | Freq: Two times a day (BID) | RESPIRATORY_TRACT | Status: DC

## 2013-11-21 MED ORDER — IPRATROPIUM-ALBUTEROL 0.5-2.5 (3) MG/3ML IN SOLN: 3.0000 mL | RESPIRATORY_TRACT | Status: DC

## 2013-11-21 NOTE — Progress Notes (Signed)
Family Medicine Teaching Service Daily Progress Note Intern Pager: [phone]  Patient name: Toni [name] Medical record number: [id number] Date of birth: 05-Feb-1935 Age: 79 y.o. Gender: female  Primary Care Provider: Tomma Rakers, PA-C Consultants: Cardiology Code Status: Full  Pt Overview and Major Events to Date:  2/28 - admitted with Afib RVR started on diltiazem drip 3/1 - dyspnea improved 3/2 - dyspnea worsened 3/3 - transferred to CCM for worsening respiratory status and placed on bipap 3/4 - respiratory status improved, transferred out to SDU  Assessment and Plan: Toni [name] is a 79 y.o. female presenting with palpatations and shortness of breath found to be in atrial fibrillation with RVR. PMH is significant for COPD, paroxysmal AF (not on anticoag), hypertension, hypothyroidism. Etiology of dyspnea certainly could be multifactorial but likely is due to Afib with RVR.   # COPD: previously finished 1 week steroids 2/23. Repeat CXRs with no evidence of infiltrate, just COPD changes - continue PO prednisone, plan for 8 day taper - atrovent daily, switch symbicort inhaler to budesonide/arformoterol nebulizer BID - Q4 hr duonebs - supplemental O2 to keep sat >92%, currently on bipap  - continue PO levaquin for exacerbation  # Afib with RVR: Controlled - history of paroxysmal afib and anticoagulation treatment with pradaxa. CHADS2-VASc score 4 (if reported EF of 55 is correct), 4% yearly risk of CVA. - CXR without evidence of pneumonia or fluid overload. She does not appear to be fluid overloaded on exam.  - telemetry and continuous pulse ox  - continue diltiazem po - daily ASA 81mg   - LDL 155, no statin as age >53 - Mag 1.5, s/p 2g bolus 3/2 - Echo: EF 55-60% - Cardiology consulted regarding echo, anticoagulation, setting up with outpatient - appreciate recommendations.  - on xarelto for anticoag given CHADVASC 4  # AKI: admission 1.18 (GFR 44), now resolved creat but  BUN still slightly elevated (GFR 67)  # Hypokalemia, resolved:  - stable at 3.9  # Hyperglycemia - resolved - A1c 5.7  # Hypothyroidism:  - on synthroid 42mcg daily  - TSH 0.585  # Hypertension: on lasix 20mg  qday, hctz, diltiazem 120mg  24hr at home  - continue home lasix, hold hctz - continue dilt 240 qd   # Lung nodule: seen on admission xray - previously worked up, repeat PET scan scheduled at [facility]  FEN/GI: diet heart, saline lock iv Prophylaxis: on xarelto  Disposition: home today  Subjective: Feels good this morning, breathing comfortably, wants to go home  Objective: Temp:  [97.3 F (36.3 C)-98 F (36.7 C)] 97.3 F (36.3 C) (03/06 0403) Pulse Rate:  [68-87] 68 (03/06 0403) Resp:  [14-23] 19 (03/05 1400) BP: (115-137)/(55-88) 121/66 mmHg (03/06 0324) SpO2:  [95 %-100 %] 100 % (03/06 0403) Weight:  [181 lb (82.101 kg)] 181 lb (82.101 kg) (03/06 0431)  Physical Exam: Gen: no respiratory distress this morning, alert, nasal canula in place HEENT: NCAT CV: RRR, good S1/S2, no murmur Resp: inspiratory and expiratory wheezes throughout all lung fields, good air movement, normal WOB Abd: SNTND, no guarding or organomegaly Ext: No edema, warm, 2+ DP pulses, varicose veins Neuro: Alert and oriented, No gross deficits  Laboratory:  Recent Labs Lab 11/18/13 1200 11/19/13 0630 11/20/13 0215  WBC 11.7* 6.0 8.6  HGB 11.7* 11.5* 12.0  HCT 36.3 35.7* 37.1  PLT 205 189 229    Recent Labs Lab 11/18/13 0345 11/19/13 0630 11/20/13 0215  NA 142 143 143  K 3.9 3.9 3.9  CL  97 99 99  CO2 32 31 30  BUN 46* 31* 36*  CREATININE 1.20* 0.76 0.83  CALCIUM 9.1 9.0 9.2  GLUCOSE 100* 131* 140*    11/16/2013 01:17  Cholesterol 220 (H)  Triglycerides 58  HDL 53  LDL (calc) 155 (H)  VLDL 12  Total CHOL/HDL Ratio 4.2   Imaging/Diagnostic Tests: CXR 2/28: No acute cardiopulmonary disease. CXR 3/2: COPD changes without evidence of acute cardiopulmonary disease. CXR  3/3: 1. Mild diffuse peribronchial cuffing may suggest bronchitis. 2. Atherosclerosis. CXR 3/5: Bilateral peribronchial thickening is again noted which is unchanged compared to prior exam. No other significant pulmonary abnormality is noted.  Beverlyn Roux, MD 11/21/2013, 7:26 AM PGY-1, [place] Intern pager: [phone], text pages welcome

## 2013-11-21 NOTE — Progress Notes (Signed)
PULMONARY / CRITICAL CARE MEDICINE  Name: Toni [name] MRN: [id number] DOB: 19-Jun-1935    ADMISSION DATE:  11/15/2013 CONSULTATION DATE:  11/18/2013  REFERRING MD :  FPTS  CHIEF COMPLAINT:  Short of breath  BRIEF PATIENT DESCRIPTION:  79 yo former smoker with COPD admitted with dyspnea and palpitations on 2/28.  Found to have AF / RVR.  Developed acute worsening of dyspnea on 3/02, and transferred to ICU 3/03.   SIGNIFICANT EVENTS: 2/28  Admitted with AF/RVR 3/01  Cardiology consulted  3/02  Dyspnea >>> lasix, steroids, nebs 3/03 T ransfer to ICU for BiPAP  STUDIES:  3/02  Echo >> EF 55 to 60%, mild AS, mild MR, mod LA/RA dilation, PAS 32 mmHg  LINES / TUBES:  CULTURES: Results for orders placed during the hospital encounter of 11/15/13  MRSA PCR SCREENING     Status: None   Collection Time    11/15/13 11:58 PM      Result Value Ref Range Status   MRSA by PCR NEGATIVE  NEGATIVE Final   Comment:            The GeneXpert MRSA Assay (FDA     approved for NASAL specimens     only), is one component of a     comprehensive MRSA colonization     surveillance program. It is not     intended to diagnose MRSA     infection nor to guide or     monitor treatment for     MRSA infections.     ANTIBIOTICS: Vancomycin 3/03 >>> 3/4 Cefepime 3/03 >>> 3/4 Levaquin 3/4 >>> (stop date 11/24/13)     INTERVAL HISTORY:     11/19/13 : Feels her breathing is at baseline.  Off BiPAP.  Wants to eat.  11/20/13: feels better but admits is tired and deconditoned. On 3L [place]   SUBJECTIVE/OVERNIGHT/INTERVAL HX 11/21/13: Sitting. Feeling better. Seems to be off o2. Feels closer to going home  VITAL SIGNS: Temp:  [97.3 F (36.3 C)-98.1 F (36.7 C)] 98.1 F (36.7 C) (03/06 0752) Pulse Rate:  [68-87] 81 (03/06 0752) Resp:  [14-23] 19 (03/05 1400) BP: (115-137)/(55-88) 136/72 mmHg (03/06 0752) SpO2:  [93 %-100 %] 93 % (03/06 0752) Weight:  [82.101 kg (181 lb)] 82.101 kg (181 lb) (03/06  0431)  HEMODYNAMICS:   VENTILATOR SETTINGS:    INTAKE / OUTPUT: Intake/Output     03/05 0701 - 03/06 0700 03/06 0701 - 03/07 0700   P.O. 240    Total Intake(mL/kg) 240 (2.9)    Urine (mL/kg/hr)     Total Output       Net +240          Urine Occurrence 4 x      PHYSICAL EXAMINATION: General: No distress, no use of accessory muscled, DECONDITIONED Neuro:  Awake, alert HEENT:  PERRL, moist membranes Cardiovascular:  Irregular, no murmur Lungs:  Diminished breath sounds, NO WHEEZE except mild in base. HUGE BARRELL CHEST Abdomen:  Soft, non tender, normal bowel sounds Musculoskeletal:  No edema Skin:  No rashes  LABS:  PULMONARY No results found for this basename: PHART, PCO2, PCO2ART, PO2, PO2ART, HCO3, TCO2, O2SAT,  in the last 168 hours  CBC  Recent Labs Lab 11/18/13 1200 11/19/13 0630 11/20/13 0215  HGB 11.7* 11.5* 12.0  HCT 36.3 35.7* 37.1  WBC 11.7* 6.0 8.6  PLT 205 189 229    COAGULATION No results found for this basename: INR,  in the last 168 hours  CARDIAC    Recent Labs Lab 11/16/13 0017 11/16/13 0809 11/16/13 1234  TROPONINI <0.30 <0.30 <0.30    Recent Labs Lab 11/15/13 1941  PROBNP 2417.0*     CHEMISTRY  Recent Labs Lab 11/15/13 1941 11/16/13 0117 11/16/13 1013 11/17/13 0822 11/18/13 0345 11/19/13 0630 11/20/13 0215  NA 140 145 138 138 142 143 143  K 3.1* 2.5* 3.7 4.2 3.9 3.9 3.9  CL 95* 96 92* 97 97 99 99  CO2 30 23 22 28  32 31 30  GLUCOSE 124* 274* 248* 131* 100* 131* 140*  BUN 26* 31* 37* 48* 46* 31* 36*  CREATININE 1.18* 1.23* 1.21* 1.20* 1.20* 0.76 0.83  CALCIUM 9.1 8.9 9.2 9.0 9.1 9.0 9.2  MG  --  1.5  --   --  2.0  --   --    Estimated Creatinine Clearance: 56 ml/min (by C-G formula based on Cr of 0.83).   LIVER No results found for this basename: AST, ALT, ALKPHOS, BILITOT, PROT, ALBUMIN, INR,  in the last 168 hours   INFECTIOUS No results found for this basename: LATICACIDVEN, PROCALCITON,  in the last  168 hours   ENDOCRINE CBG (last 3)   Recent Labs  11/20/13 1635 11/20/13 2205 11/21/13 0750  GLUCAP 133* 222* 128*         IMAGING x48h  Dg Chest Port 1 View  11/20/2013   CLINICAL DATA:  Shortness of breath, cough.  EXAM: PORTABLE CHEST - 1 VIEW  COMPARISON:  November 18, 2013.  FINDINGS: Stable cardiomediastinal silhouette. No pleural effusion or pneumothorax is noted. Stable mild bilateral peribronchial thickening is again noted. No significant airspace opacity is noted. Narrowing of the left acromial humeral space is noted suggesting rotator cuff injury.  IMPRESSION: Bilateral peribronchial thickening is again noted which is unchanged compared to prior exam. No other significant pulmonary abnormality is noted.   Electronically Signed   By: Sabino Dick M.D.   On: 11/20/2013 09:14     ASSESSMENT / PLAN:  PULMONARY A: Acute respiratory failure in setting of COPD and AF/RVR AECOPD Less likely pneumonia  - slowly improving  P:   Check day time resting ABG: to determine if she needs nocturnal home bipap and discharge sutiability Goal SpO2>92; Supplemental oxygen to titrate Oral steroids and taper ove 8 days from 11/20/13 Duoneb q6h + QVAR bid (needs to go home on NEBS as opposed to MDI) given advanced COPD  CARDIOVASCULAR A:  AF/RVR. Hx of HTN. P:  Per Cardiology Cardizem, xarelto per cardiology  RENAL A:   CKD P:   Trend BMP  GASTROINTESTINAL A:   Nutrition P:   Diet  HEMATOLOGIC A:   Therapeutic anticoagulation for AF/RVR  P:  Trend BMP  INFECTIOUS A:   Empirical abx for AECOPD Pneumonia less likely  P:   Change abx to Levaquin; complete 11/24/13   ENDOCRINE A:   Hypothyroidism Steroid induced hyperglycemia P:   Change SSI to ac$hs Synthroid  NEUROLOGIC A:   No acute issues P:   No intervention required   GLOBAL  - get PT clearannce before dc home.  ANticipate home 1-2 days  FU  - NP Tammy Parrett [facility] [place],  [place], [place]:  11/28/13 Friday 11.30am - MD Dr Elmarie [facility] in [place] for 12/10/13 Wednesday at 11.45am    PCCM Will see once over weekend   Dr. Brand Males, M.D., [facility].C.P Pulmonary and Critical Care Medicine Staff Physician [place] [facility]  [facility] Pager: [phone], If no answer or between  15:00h - 7:00h: call [phone]  11/21/2013 11:19 AM

## 2013-11-21 NOTE — Progress Notes (Addendum)
Went into the patient room around 400pm to change the IV dressing in her right hand and noted that her right hand had a large blood bruise from the site of the IV.  Removed IV and held pressure. She says that the IV doesn't hurt but I told her we need to monitor that hand so we don't have any problems. It is elevated on a pillow and IV team has been call to start a new IV. Two IV starts were attempted but without success. Will continue to monitor.  Md notified of bruising of IV in Left hand. No new orders given other than to monitor the hand for further swelling or pain.  MD OK with patient going without IV tonight hopefully discharging in the am.   Rennie Plowman RN

## 2013-11-21 NOTE — Progress Notes (Signed)
Interval note  Called by RN and told about the large bruise on the patient's left-hand apparently associated with her IV. When discussed with the patient who denies any pain or complaints about the area at all.  O: Left hand with large hyperpigmented area consistent with bruise, she also is a very thin skin.  A/p 80 year old female admitted with dyspnea with complicated medical course involving one short stent in the ICU but not intubation with an IV associated left hand bruise. On exam she is thin skin with a soft fluctuant bruise beneath it. The nurse has outlined the area that was initially involved and it has not spread beyond this. The patient's in no apparent discomfort so we'll just closely monitor the hand.   Laroy Apple, MD [place] Resident, PGY-2 11/21/2013, 6:39 PM

## 2013-11-21 NOTE — Progress Notes (Signed)
    Subjective:  Denies CP; dyspnea improving   Objective:  Filed Vitals:   11/21/13 0324 11/21/13 0403 11/21/13 0431 11/21/13 0752  BP: 121/66   136/72  Pulse:  68  81  Temp:  97.3 F (36.3 C)  98.1 F (36.7 C)  TempSrc:  Oral  Oral  Resp:      Height:      Weight:   181 lb (82.101 kg)   SpO2:  100%  93%    Intake/Output from previous day:  Intake/Output Summary (Last 24 hours) at 11/21/13 1975 Last data filed at 11/20/13 0900  Gross per 24 hour  Intake    240 ml  Output      0 ml  Net    240 ml    Physical Exam: Physical exam: Well-developed chronically ill appearing in no acute distress.  Skin is warm and dry.  HEENT is normal.  Neck is supple. Chest diminished BS throughout; diffuse exp wheeze but improved Cardiovascular exam is regular rate and rhythm. Distant heart sounds Abdominal exam nontender or distended. No masses palpated. Extremities show no edema. neuro grossly intact    Lab Results: Basic Metabolic Panel:  Recent Labs  11/19/13 0630 11/20/13 0215  NA 143 143  K 3.9 3.9  CL 99 99  CO2 31 30  GLUCOSE 131* 140*  BUN 31* 36*  CREATININE 0.76 0.83  CALCIUM 9.0 9.2   CBC:  Recent Labs  11/19/13 0630 11/20/13 0215  WBC 6.0 8.6  HGB 11.5* 12.0  HCT 35.7* 37.1  MCV 89.7 90.0  PLT 189 229     Assessment/Plan:  1. Atypical atrial flutter versus ectopic atrial tachycardia, initially presenting with RVR; now in sinus with first degree AV block. CHADSVASC score is 4. Continue cardizem and xeralto. 2. COPD/bronchitis. Continue antibiotics and bronchodilators; per primary service. 3. History of hypertension. BP controlled 4. AS-mild on echo; will need FU in the future. Patient can FU with Dr Rockey Situ or Fletcher Anon in [place] following [place] 11/21/2013, 8:32 AM

## 2013-11-21 NOTE — Progress Notes (Signed)
FMTS Attending Note Patient seen and examined by me, discussed with resident team and I agree with Dr Quinn Plowman note as documented.  Appreciate CCM and Cardiology consults.  Plan for PT evaluation, ABG to determine whether she needs BiPAP before going home over the weekend (most likely).  From a clinical standpoint she continues to improve.  Dalbert Mayotte, MD

## 2013-11-22 LAB — CBC WITH DIFFERENTIAL/PLATELET
Basophils Absolute: 0 10*3/uL (ref 0.0–0.1)
Basophils Relative: 0 % (ref 0–1)
Eosinophils Absolute: 0 10*3/uL (ref 0.0–0.7)
Eosinophils Relative: 0 % (ref 0–5)
HCT: 33.3 % — ABNORMAL LOW (ref 36.0–46.0)
Hemoglobin: 11.1 g/dL — ABNORMAL LOW (ref 12.0–15.0)
Lymphocytes Relative: 12 % (ref 12–46)
Lymphs Abs: 0.9 10*3/uL (ref 0.7–4.0)
MCH: 29.7 pg (ref 26.0–34.0)
MCHC: 33.3 g/dL (ref 30.0–36.0)
MCV: 89 fL (ref 78.0–100.0)
Monocytes Absolute: 0.8 10*3/uL (ref 0.1–1.0)
Monocytes Relative: 11 % (ref 3–12)
Neutro Abs: 5.8 10*3/uL (ref 1.7–7.7)
Neutrophils Relative %: 77 % (ref 43–77)
Platelets: 241 10*3/uL (ref 150–400)
RBC: 3.74 MIL/uL — ABNORMAL LOW (ref 3.87–5.11)
RDW: 16.6 % — ABNORMAL HIGH (ref 11.5–15.5)
WBC: 7.5 10*3/uL (ref 4.0–10.5)

## 2013-11-22 LAB — GLUCOSE, CAPILLARY
GLUCOSE-CAPILLARY: 103 mg/dL — AB (ref 70–99)
Glucose-Capillary: 93 mg/dL (ref 70–99)
Glucose-Capillary: 135 mg/dL — ABNORMAL HIGH (ref 70–99)
Glucose-Capillary: 140 mg/dL — ABNORMAL HIGH (ref 70–99)

## 2013-11-22 LAB — BASIC METABOLIC PANEL
BUN: 40 mg/dL — ABNORMAL HIGH (ref 6–23)
CO2: 29 meq/L (ref 19–32)
CREATININE: 0.92 mg/dL (ref 0.50–1.10)
Calcium: 9.2 mg/dL (ref 8.4–10.5)
Chloride: 98 mEq/L (ref 96–112)
GFR calc Af Amer: 68 mL/min — ABNORMAL LOW (ref >90)
GFR calc non Af Amer: 59 mL/min — ABNORMAL LOW (ref >90)
Glucose, Bld: 104 mg/dL — ABNORMAL HIGH (ref 70–99)
POTASSIUM: 3.9 meq/L (ref 3.7–5.3)
Sodium: 139 mEq/L (ref 137–147)

## 2013-11-22 LAB — PHOSPHORUS: Phosphorus: 2.7 mg/dL (ref 2.3–4.6)

## 2013-11-22 LAB — PRO B NATRIURETIC PEPTIDE: Pro B Natriuretic peptide (BNP): 2119 pg/mL — ABNORMAL HIGH (ref 0–450)

## 2013-11-22 LAB — MAGNESIUM: Magnesium: 2 mg/dL (ref 1.5–2.5)

## 2013-11-22 MED ORDER — FLECAINIDE ACETATE 50 MG PO TABS
50.0000 mg | ORAL_TABLET | Freq: Two times a day (BID) | ORAL | Status: DC
  Filled 2013-11-22 (×2): qty 1

## 2013-11-22 MED ORDER — FLECAINIDE ACETATE 100 MG PO TABS
200.0000 mg | ORAL_TABLET | Freq: Once | ORAL | Status: AC
  Administered 2013-11-22: 200 mg via ORAL
  Filled 2013-11-22: qty 2

## 2013-11-22 NOTE — Progress Notes (Signed)
Interim Progress Note  Called by nurse that patient went into AFib with RVR on diltiazem already. Not in distress. HR in 130s.  On exam,  Pulse 132, BP 118/62 per nurse check GEN: patient is getting cleaned by nurse. She is conversant and feels okay except feels sad she cannot go home. CV: atrial fib with rapid ventricular rate.  A/P: Will cancel d/c order and monitor overnight. This is second episode of RVR (first was on admission). Dr Lovena Le (cards) has already seen pt and added flecainide 200mg  now followed by 50mg  BID. Will monitor heart rate. Pt is on telemetry and otherwise stable-appearing.  [name], MD

## 2013-11-22 NOTE — Evaluation (Signed)
Physical Therapy Evaluation Patient Details Name: Toni [name] MRN: [id number] DOB: 22-Nov-1934 Today's Date: 11/22/2013 Time: 6384-6659 PT Time Calculation (min): 11 min  PT Assessment / Plan / Recommendation History of Present Illness  79 yo former smoker with COPD admitted with dyspnea and palpitations on 2/28.  Found to have AF / RVR.  Developed acute worsening of dyspnea on 3/02, and transferred to ICU 3/03.   Clinical Impression  Pt doing well with mobility and no further PT needed.  Ready for dc from PT standpoint.      PT Assessment  Patent does not need any further PT services    Follow Up Recommendations  No PT follow up    Does the patient have the potential to tolerate intense rehabilitation      Barriers to Discharge        Equipment Recommendations  None recommended by PT    Recommendations for Other Services     Frequency      Precautions / Restrictions Precautions Precautions: None   Pertinent Vitals/Pain See flow sheet.      Mobility  Transfers Overall transfer level: Modified independent Equipment used: None Ambulation/Gait Ambulation/Gait assistance: Modified independent (Device/Increase time) Ambulation Distance (Feet): 200 Feet Assistive device: None Gait Pattern/deviations: WFL(Within Functional Limits)    Exercises     PT Diagnosis:    PT Problem List:   PT Treatment Interventions:       PT Goals(Current goals can be found in the care plan section) Acute Rehab PT Goals PT Goal Formulation: No goals set, d/c therapy  Visit Information  Last PT Received On: 11/22/13 Assistance Needed: +1 History of Present Illness: 79 yo former smoker with COPD admitted with dyspnea and palpitations on 2/28.  Found to have AF / RVR.  Developed acute worsening of dyspnea on 3/02, and transferred to ICU 3/03.        Prior [place] expects to be discharged to:: Private residence Living Arrangements: Spouse/significant  other Available Help at Discharge: Family Type of Home: House Home Access: Stairs to enter Technical brewer of Steps: 2-3 Entrance Stairs-Rails: None Home Layout: One level [place]: Environmental consultant - 2 wheels Prior Function Level of Independence: Independent with assistive device(s) Comments: Uses rolling walker at times when out Communication Communication: No difficulties    Cognition  Cognition Arousal/Alertness: Awake/alert Behavior During Therapy: WFL for tasks assessed/performed Overall Cognitive Status: Within Functional Limits for tasks assessed    Extremity/Trunk Assessment Upper Extremity Assessment Upper Extremity Assessment: Overall WFL for tasks assessed Lower Extremity Assessment Lower Extremity Assessment: Overall WFL for tasks assessed   Balance Balance Overall balance assessment: No apparent balance deficits (not formally assessed)  End of Session PT - End of Session Activity Tolerance: Patient tolerated treatment well Patient left: in chair;with call bell/phone within reach;with family/visitor present Nurse Communication: Mobility status  GP     Mckaylie Vasey 11/22/2013, 4:31 PM  [employer]

## 2013-11-22 NOTE — Progress Notes (Signed)
Interim Progress Note  Informed by Santiago Glad patient's nurse that she has been going in and out of afib and ST, mostly in Afib and HR in 100s-120s, mostly in 110s. Patient is not uncomfortable at all during this. Flecainide had been started today and pt's PO dilt continued. She is a difficult IV so does not have IV at this time. Spoke with cardiologist on call Dr Percival Spanish who suggested monitoring for now and reassessing in AM. If patient becomes uncomfortable with this, can re-discuss with cardiology.  [name], MD

## 2013-11-22 NOTE — Progress Notes (Signed)
Text paged resident on call and made them aware of pt now in and out of AFib with rate 90-120's. VSS. Pt resting comfortably in chair with rate mostly in low 100's. Pt at times going into ST but mostly AFib/ Family at bedside.Will continue to assess.

## 2013-11-22 NOTE — Progress Notes (Signed)
Patient ID: Toni [name], female   DOB: Sep 28, 1934, 79 y.o.   MRN: [id number]   Patient Name: Toni [name] Date of Encounter: 11/22/2013     Active Problems:   COPD (chronic obstructive pulmonary disease)   Atrial fibrillation with RVR   Atypical atrial flutter   COPD exacerbation   Acute respiratory failure    SUBJECTIVE Denies any change in symptoms. No palpitations or chest pain, denies sob  CURRENT MEDS . beclomethasone  2 puff Inhalation BID  . diltiazem  240 mg Oral Daily  . insulin aspart  0-15 Units Subcutaneous TID WC  . insulin aspart  0-5 Units Subcutaneous QHS  . ipratropium-albuterol  3 mL Nebulization Q6H  . levofloxacin  500 mg Oral Daily  . levothyroxine  75 mcg Oral QAC breakfast  . predniSONE  30 mg Oral Q breakfast  . rivaroxaban  20 mg Oral Q supper    OBJECTIVE  Filed Vitals:   11/21/13 2054 11/22/13 0304 11/22/13 0618 11/22/13 0747  BP:   112/65   Pulse:   73   Temp:   97.6 F (36.4 C)   TempSrc:   Oral   Resp:   18   Height:      Weight:   178 lb 5.6 oz (80.9 kg)   SpO2: 96% 96% 97% 94%   No intake or output data in the 24 hours ending 11/22/13 1117 Filed Weights   11/20/13 0329 11/21/13 0431 11/22/13 0618  Weight: 180 lb 8.9 oz (81.9 kg) 181 lb (82.101 kg) 178 lb 5.6 oz (80.9 kg)    PHYSICAL EXAM  General: Pleasant, NAD. Neuro: Alert and oriented X 3. Moves all extremities spontaneously. Psych: Normal affect. HEENT:  Normal  Neck: Supple without bruits or JVD. Lungs:  Resp regular and unlabored, CTA. Heart: Reg tachycardia, no s3, s4, or murmurs. Abdomen: Soft, non-tender, non-distended, BS + x 4.  Extremities: No clubbing, cyanosis or edema. DP/PT/Radials 2+ and equal bilaterally.  Accessory Clinical Findings  CBC  Recent Labs  11/20/13 0215 11/22/13 0328  WBC 8.6 7.5  NEUTROABS  --  5.8  HGB 12.0 11.1*  HCT 37.1 33.3*  MCV 90.0 89.0  PLT 229 485   Basic Metabolic Panel  Recent Labs  11/20/13 0215 11/22/13 0328   NA 143 139  K 3.9 3.9  CL 99 98  CO2 30 29  GLUCOSE 140* 104*  BUN 36* 40*  CREATININE 0.83 0.92  CALCIUM 9.2 9.2  MG  --  2.0  PHOS  --  2.7   Liver Function Tests No results found for this basename: AST, ALT, ALKPHOS, BILITOT, PROT, ALBUMIN,  in the last 72 hours No results found for this basename: LIPASE, AMYLASE,  in the last 72 hours Cardiac Enzymes No results found for this basename: CKTOTAL, CKMB, CKMBINDEX, TROPONINI,  in the last 72 hours BNP No components found with this basename: POCBNP,  D-Dimer No results found for this basename: DDIMER,  in the last 72 hours Hemoglobin A1C No results found for this basename: HGBA1C,  in the last 72 hours Fasting Lipid Panel No results found for this basename: CHOL, HDL, LDLCALC, TRIG, CHOLHDL, LDLDIRECT,  in the last 72 hours Thyroid Function Tests No results found for this basename: TSH, T4TOTAL, FREET3, T3FREE, THYROIDAB,  in the last 72 hours  TELE NSR to atrial flutter with an RVR   Radiology/Studies  Dg Chest 2 View  11/17/2013   CLINICAL DATA:  Cough, copd  EXAM: CHEST  2 VIEW  COMPARISON:  DG CHEST 1V PORT dated 11/15/2013  FINDINGS: The heart size and mediastinal contours are within normal limits. Atherosclerotic calcifications appreciated within aorta. Lungs are clear. There is increased AP diameter of the chest. The osseous structures demonstrate degenerative changes in the shoulders.  IMPRESSION: COPD changes without evidence of acute cardiopulmonary disease.   Electronically Signed   By: Margaree Mackintosh M.D.   On: 11/17/2013 11:52   Dg Chest Port 1 View  11/20/2013   CLINICAL DATA:  Shortness of breath, cough.  EXAM: PORTABLE CHEST - 1 VIEW  COMPARISON:  November 18, 2013.  FINDINGS: Stable cardiomediastinal silhouette. No pleural effusion or pneumothorax is noted. Stable mild bilateral peribronchial thickening is again noted. No significant airspace opacity is noted. Narrowing of the left acromial humeral space is noted  suggesting rotator cuff injury.  IMPRESSION: Bilateral peribronchial thickening is again noted which is unchanged compared to prior exam. No other significant pulmonary abnormality is noted.   Electronically Signed   By: Sabino Dick M.D.   On: 11/20/2013 09:14   Dg Chest Port 1 View  11/18/2013   CLINICAL DATA:  Respiratory distress.  EXAM: PORTABLE CHEST - 1 VIEW  COMPARISON:  Chest x-ray 11/17/2013.  FINDINGS: Lung volumes are low. No acute consolidative airspace disease. Diffuse peribronchial cuffing. Pulmonary vasculature appears within normal limits. Heart size is borderline enlarged. The patient is rotated to the right on today's exam, resulting in distortion of the mediastinal contours and reduced diagnostic sensitivity and specificity for mediastinal pathology. Atherosclerosis in the thoracic aorta.  IMPRESSION: 1. Mild diffuse peribronchial cuffing may suggest bronchitis. 2. Atherosclerosis.   Electronically Signed   By: Vinnie Langton M.D.   On: 11/18/2013 12:16   Dg Chest Portable 1 View  11/15/2013   CLINICAL DATA:  Short of breath for several days.  EXAM: PORTABLE CHEST - 1 VIEW  COMPARISON:  11/12/2008  FINDINGS: Cardiac silhouette is normal in size. Aorta is mildly uncoiled. No mediastinal or hilar masses.  Small ill-defined nodule in the right mid lung is stable from the prior exam. No lung consolidation or edema. No pleural effusion or pneumothorax.  The bony thorax is diffusely demineralized.  IMPRESSION: No acute cardiopulmonary disease.   Electronically Signed   By: Lajean Manes M.D.   On: 11/15/2013 19:55    ASSESSMENT AND PLAN 1. Recurrent atrial flutter - if she is discharged she will come back sooner than later. She is on diltiazem. She is anti-coagulated. Will try some flecainide.   Navi Ewton,M.D.  11/22/2013 11:17 AM

## 2013-11-22 NOTE — Progress Notes (Signed)
Interim Progress Note  Called nurse to check on patient - she is back in sinus rhythm with rate 99. Asked nurse to please inform me if she goes back into rapid ventricular rate.  [name], MD

## 2013-11-22 NOTE — Progress Notes (Signed)
Family Medicine Teaching Service Daily Progress Note Intern Pager: [phone]  Patient name: Toni [name] Medical record number: [id number] Date of birth: 02-Jun-1935 Age: 79 y.o. Gender: female  Primary Care Provider: Tomma Rakers, PA-C Consultants: Cardiology, Pulmonology Code Status: Full  Pt Overview and Major Events to Date:  2/28 - admitted with Afib RVR started on diltiazem drip 3/1 - dyspnea improved 3/2 - dyspnea worsened 3/3 - transferred to CCM for worsening respiratory status and placed on bipap 3/4 - respiratory status improved, transferred out to SDU  Assessment and Plan: Toni [name] is a 79 y.o. female presenting with palpatations and shortness of breath found to be in atrial fibrillation with RVR. PMH is significant for COPD, paroxysmal AF (not on anticoag), hypertension, hypothyroidism. Etiology of dyspnea certainly could be multifactorial but likely is due to Afib with RVR.   # COPD: previously finished 1 week steroids 2/23. Repeat CXRs with no evidence of infiltrate, just COPD changes - continue PO prednisone, plan for 8 day taper - atrovent daily, switch symbicort inhaler to budesonide/arformoterol nebulizer BID - Q4 hr duonebs - supplemental O2 to keep sat >92%, currently on bipap  - continue PO levaquin for exacerbation  # Afib with RVR: Controlled - history of paroxysmal afib and anticoagulation treatment with pradaxa. CHADS2-VASc score 4 (if reported EF of 55 is correct), 4% yearly risk of CVA. - CXR without evidence of pneumonia or fluid overload. She does not appear to be fluid overloaded on exam.  - telemetry and continuous pulse ox  - continue diltiazem po - daily ASA 81mg   - LDL 155, no statin as age >69 - Mag 1.5, s/p 2g bolus 3/2 - Echo: EF 55-60% - Cardiology consulted regarding echo, anticoagulation, setting up with outpatient - appreciate recommendations.  - on xarelto for anticoag given CHADVASC 4  # AKI: admission 1.18 (GFR 44), now  resolved creat but BUN still slightly elevated (GFR 67)  # Hypokalemia, resolved:  - stable at 3.9  # Hyperglycemia - resolved - A1c 5.7  # Hypothyroidism:  - on synthroid 36mcg daily  - TSH 0.585  # Hypertension: on lasix 20mg  qday, hctz, diltiazem 120mg  24hr at home  - continue home lasix, hold hctz - continue dilt 240 qd   # Lung nodule: seen on admission xray - previously worked up, repeat PET scan scheduled at [facility]  FEN/GI: diet heart, saline lock iv Prophylaxis: on xarelto  Disposition: discharge today; spoke with Dr. Alva Garnet of critical care who agrees with discharge.  Subjective: Feels good this morning, breathing comfortably, wants to go home  Objective: Temp:  [97.5 F (36.4 C)-98.4 F (36.9 C)] 97.6 F (36.4 C) (03/07 0618) Pulse Rate:  [73-82] 73 (03/07 0618) Resp:  [18] 18 (03/07 0618) BP: (112-143)/(46-72) 112/65 mmHg (03/07 0618) SpO2:  [94 %-100 %] 94 % (03/07 0747) FiO2 (%):  [28 %] 28 % (03/07 0747) Weight:  [178 lb 5.6 oz (80.9 kg)] 178 lb 5.6 oz (80.9 kg) (03/07 0618)  Physical Exam: Gen: no respiratory distress this morning, alert, nasal canula in place HEENT: NCAT CV: RRR, good S1/S2, no murmur Resp: scattered expiratory wheezes throughout all lung fields, good air movement, normal WOB Abd: SNTND, no guarding or organomegaly Ext: No edema, warm, 2+ DP pulses, varicose veins Neuro: Alert and oriented, No gross deficits  Laboratory:  Recent Labs Lab 11/19/13 0630 11/20/13 0215 11/22/13 0328  WBC 6.0 8.6 7.5  HGB 11.5* 12.0 11.1*  HCT 35.7* 37.1 33.3*  PLT 189 229 241  Recent Labs Lab 11/19/13 0630 11/20/13 0215 11/22/13 0328  NA 143 143 139  K 3.9 3.9 3.9  CL 99 99 98  CO2 31 30 29   BUN 31* 36* 40*  CREATININE 0.76 0.83 0.92  CALCIUM 9.0 9.2 9.2  GLUCOSE 131* 140* 104*    11/16/2013 01:17  Cholesterol 220 (H)  Triglycerides 58  HDL 53  LDL (calc) 155 (H)  VLDL 12  Total CHOL/HDL Ratio 4.2   Imaging/Diagnostic  Tests: CXR 2/28: No acute cardiopulmonary disease. CXR 3/2: COPD changes without evidence of acute cardiopulmonary disease. CXR 3/3: 1. Mild diffuse peribronchial cuffing may suggest bronchitis. 2. Atherosclerosis. CXR 3/5: Bilateral peribronchial thickening is again noted which is unchanged compared to prior exam. No other significant pulmonary abnormality is noted.  Melony Overly, MD 11/22/2013, 10:18 AM PGY-3, [place] Intern pager: [phone], text pages welcome

## 2013-11-22 NOTE — Progress Notes (Signed)
FMTS Attending Note Patient seen and examined by me today.  Given recurrence of A flutter while on diltiazem, to add flecainide as per Dr. Tanna Furry plan.  Anticoagulated already. Continue to monitor.  Dalbert Mayotte, MD

## 2013-11-22 NOTE — Progress Notes (Signed)
PULMONARY / CRITICAL CARE MEDICINE  Name: Toni [name] MRN: [id number] DOB: Feb 14, 1935    ADMISSION DATE:  11/15/2013 CONSULTATION DATE:  11/18/2013  REFERRING MD :  FPTS  CHIEF COMPLAINT:  Short of breath  BRIEF PATIENT DESCRIPTION:  79 yo former smoker with COPD admitted with dyspnea and palpitations on 2/28.  Found to have AF / RVR.  Developed acute worsening of dyspnea on 3/02, and transferred to ICU 3/03.   SIGNIFICANT EVENTS: 2/28  Admitted with AF/RVR 3/01  Cardiology consulted  3/02  Dyspnea >>> lasix, steroids, nebs 3/03 T ransfer to ICU for BiPAP  STUDIES:  3/02  Echo >> EF 55 to 60%, mild AS, mild MR, mod LA/RA dilation, PAS 32 mmHg  LINES / TUBES:  CULTURES: Sputum requested not sent   ANTIBIOTICS: Vancomycin 3/03 >>> 3/4 Cefepime 3/03 >>> 3/4 Levaquin 3/4 >>> (stop date 11/24/13)     INTERVAL HISTORY:      feels back to baseline sob on 2lpm 24/7    SUBJECTIVE/OVERNIGHT/INTERVAL HX 11/21/13: Sitting. Feeling better. Seems to be off o2. Feels closer to going home  VITAL SIGNS: Temp:  [97.5 F (36.4 C)-98.4 F (36.9 C)] 97.6 F (36.4 C) (03/07 0618) Pulse Rate:  [73-82] 73 (03/07 0618) Resp:  [18] 18 (03/07 0618) BP: (112-143)/(46-72) 112/65 mmHg (03/07 0618) SpO2:  [94 %-100 %] 94 % (03/07 0747) FiO2 (%):  [28 %] 28 % (03/07 0747) Weight:  [178 lb 5.6 oz (80.9 kg)] 178 lb 5.6 oz (80.9 kg) (03/07 0618)  HEMODYNAMICS:   VENTILATOR SETTINGS: Vent Mode:  [-]  FiO2 (%):  [28 %] 28 %  INTAKE / OUTPUT: Intake/Output     03/06 0701 - 03/07 0700 03/07 0701 - 03/08 0700   P.O.     Total Intake(mL/kg)     Net              PHYSICAL EXAMINATION: General: No distress, no use of accessory muscled   Neuro:  Awake, alert HEENT:  PERRL, moist membranes Cardiovascular:  Irregular, no murmur Lungs:  Diminished breath sounds, NO WHEEZE except mild in base. HUGE BARREL  CHEST Abdomen:  Soft, non tender, normal bowel sounds Musculoskeletal:  No edema Skin:   No rashes  LABS:  PULMONARY No results found for this basename: PHART, PCO2, PCO2ART, PO2, PO2ART, HCO3, TCO2, O2SAT,  in the last 168 hours  CBC  Recent Labs Lab 11/19/13 0630 11/20/13 0215 11/22/13 0328  HGB 11.5* 12.0 11.1*  HCT 35.7* 37.1 33.3*  WBC 6.0 8.6 7.5  PLT 189 229 241    COAGULATION No results found for this basename: INR,  in the last 168 hours  CARDIAC    Recent Labs Lab 11/16/13 0017 11/16/13 0809 11/16/13 1234  TROPONINI <0.30 <0.30 <0.30    Recent Labs Lab 11/15/13 1941 11/22/13 0328  PROBNP 2417.0* 2119.0*     CHEMISTRY  Recent Labs Lab 11/15/13 1941 11/16/13 0117  11/17/13 0822 11/18/13 0345 11/19/13 0630 11/20/13 0215 11/22/13 0328  NA 140 145  < > 138 142 143 143 139  K 3.1* 2.5*  < > 4.2 3.9 3.9 3.9 3.9  CL 95* 96  < > 97 97 99 99 98  CO2 30 23  < > 28 32 31 30 29   GLUCOSE 124* 274*  < > 131* 100* 131* 140* 104*  BUN 26* 31*  < > 48* 46* 31* 36* 40*  CREATININE 1.18* 1.23*  < > 1.20* 1.20* 0.76 0.83 0.92  CALCIUM 9.1 8.9  < >  9.0 9.1 9.0 9.2 9.2  MG  --  1.5  --   --  2.0  --   --  2.0  PHOS  --   --   --   --   --   --   --  2.7  < > = values in this interval not displayed. Estimated Creatinine Clearance: 50.1 ml/min (by C-G formula based on Cr of 0.92).   LIVER No results found for this basename: AST, ALT, ALKPHOS, BILITOT, PROT, ALBUMIN, INR,  in the last 168 hours   INFECTIOUS No results found for this basename: LATICACIDVEN, PROCALCITON,  in the last 168 hours   ENDOCRINE CBG (last 3)   Recent Labs  11/21/13 1640 11/21/13 2208 11/22/13 0616  GLUCAP 163* 147* 103*         IMAGING x48h  No results found.   ASSESSMENT / PLAN:  PULMONARY A: Acute respiratory failure in setting of COPD and AF/RVR AECOPD with chronic 02 dep resp failure    Goal SpO2>92; Supplemental oxygen to titrate Oral steroids and taper ove 8 days from 11/20/13 Duoneb q6h + QVAR bid (needs to go home on NEBS as opposed  to MDI) given advanced COPD F/u planned in [place]:  AF/RVR. Hx of HTN. P:  Per Cardiology Cardizem, xarelto per cardiology  RENAL A:   CKD P:   Trend BMP  GASTROINTESTINAL A:   Nutrition P:   Diet  HEMATOLOGIC A:   Therapeutic anticoagulation for AF/RVR  P:  Trend BMP  INFECTIOUS A:   Empirical abx for AECOPD Pneumonia less likely  P:   Changed abx to Levaquin as per dashboard> complete 11/24/13   ENDOCRINE A:   Hypothyroidism Steroid induced hyperglycemia P:    Synthroid rx per primary svc  NEUROLOGIC A:   No acute issues P:   No intervention required     FU  - NP Tammy Parrett [facility] [address], [place], [place] [employer]:  11/28/13 Friday 11.30am - MD Dr Elmarie Shiley [facility] in [place] for 12/10/13 Wednesday at 11.45am      [facility] for discharge 3/7    Christinia Gully, MD Pulmonary and [place] [phone] After 5:30 PM or weekends, call [phone]

## 2013-11-22 NOTE — Progress Notes (Signed)
Talked with resident on call. Made aware that still in Afib with rate starting to creep up more in 100-130-140's. No new orders at this time. Will continue to assess.

## 2013-11-22 NOTE — Progress Notes (Signed)
FMTS Attending Note Patient seen and examined by me, discussed with Dr Bridgett Larsson and agree with her plan.   Dalbert Mayotte, MD

## 2013-11-23 LAB — CBC WITH DIFFERENTIAL/PLATELET
BASOS PCT: 0 % (ref 0–1)
Basophils Absolute: 0 10*3/uL (ref 0.0–0.1)
EOS ABS: 0 10*3/uL (ref 0.0–0.7)
EOS PCT: 0 % (ref 0–5)
HEMATOCRIT: 35.9 % — AB (ref 36.0–46.0)
Hemoglobin: 11.7 g/dL — ABNORMAL LOW (ref 12.0–15.0)
LYMPHS ABS: 1.5 10*3/uL (ref 0.7–4.0)
Lymphocytes Relative: 20 % (ref 12–46)
MCH: 29.1 pg (ref 26.0–34.0)
MCHC: 32.6 g/dL (ref 30.0–36.0)
MCV: 89.3 fL (ref 78.0–100.0)
MONO ABS: 1 10*3/uL (ref 0.1–1.0)
Monocytes Relative: 13 % — ABNORMAL HIGH (ref 3–12)
NEUTROS ABS: 5.2 10*3/uL (ref 1.7–7.7)
NEUTROS PCT: 67 % (ref 43–77)
Platelets: 259 10*3/uL (ref 150–400)
RBC: 4.02 MIL/uL (ref 3.87–5.11)
RDW: 16.7 % — ABNORMAL HIGH (ref 11.5–15.5)
WBC: 7.7 10*3/uL (ref 4.0–10.5)

## 2013-11-23 LAB — BASIC METABOLIC PANEL
BUN: 36 mg/dL — ABNORMAL HIGH (ref 6–23)
CO2: 28 meq/L (ref 19–32)
CREATININE: 0.9 mg/dL (ref 0.50–1.10)
Calcium: 8.9 mg/dL (ref 8.4–10.5)
Chloride: 100 mEq/L (ref 96–112)
GFR calc Af Amer: 70 mL/min — ABNORMAL LOW (ref >90)
GFR, EST NON AFRICAN AMERICAN: 61 mL/min — AB (ref >90)
GLUCOSE: 86 mg/dL (ref 70–99)
Potassium: 4.2 mEq/L (ref 3.7–5.3)
Sodium: 139 mEq/L (ref 137–147)

## 2013-11-23 LAB — GLUCOSE, CAPILLARY
GLUCOSE-CAPILLARY: 94 mg/dL (ref 70–99)
Glucose-Capillary: 102 mg/dL — ABNORMAL HIGH (ref 70–99)
Glucose-Capillary: 125 mg/dL — ABNORMAL HIGH (ref 70–99)

## 2013-11-23 LAB — PHOSPHORUS: Phosphorus: 2.4 mg/dL (ref 2.3–4.6)

## 2013-11-23 LAB — MAGNESIUM: MAGNESIUM: 1.9 mg/dL (ref 1.5–2.5)

## 2013-11-23 MED ORDER — OFF THE BEAT BOOK
Freq: Once | Status: AC
  Administered 2013-11-23: 17:00:00
  Filled 2013-11-23: qty 1

## 2013-11-23 MED ORDER — FLECAINIDE ACETATE 100 MG PO TABS
100.0000 mg | ORAL_TABLET | Freq: Two times a day (BID) | ORAL | Status: DC
  Administered 2013-11-23 – 2013-11-24 (×2): 100 mg via ORAL
  Filled 2013-11-23 (×3): qty 1

## 2013-11-23 MED ORDER — FLECAINIDE ACETATE 100 MG PO TABS: 100.0000 mg | ORAL_TABLET | Freq: Two times a day (BID) | ORAL | Status: DC

## 2013-11-23 MED ORDER — FLECAINIDE ACETATE 50 MG PO TABS
50.0000 mg | ORAL_TABLET | Freq: Two times a day (BID) | ORAL | Status: DC
Start: 2013-11-23 — End: 2013-11-23
  Administered 2013-11-23: 50 mg via ORAL
  Filled 2013-11-23 (×2): qty 1

## 2013-11-23 MED ORDER — FLECAINIDE ACETATE 50 MG PO TABS
50.0000 mg | ORAL_TABLET | Freq: Once | ORAL | Status: AC
  Administered 2013-11-23: 50 mg via ORAL
  Filled 2013-11-23: qty 1

## 2013-11-23 MED ORDER — FLECAINIDE ACETATE 100 MG PO TABS
100.0000 mg | ORAL_TABLET | Freq: Two times a day (BID) | ORAL | Status: DC
  Filled 2013-11-23 (×2): qty 1

## 2013-11-23 NOTE — Progress Notes (Signed)
Family Medicine Teaching Service Daily Progress Note Intern Pager: [phone]  Patient name: Toni [name] Medical record number: [id number] Date of birth: [birth year] Age: 79 y.o. Gender: female  Primary Care Provider: Tomma Rakers, PA-C Consultants: Cardiology, Pulmonology Code Status: Full  Pt Overview and Major Events to Date:  2/28 - admitted with Afib RVR started on diltiazem drip 3/1 - dyspnea improved 3/2 - dyspnea worsened 3/3 - transferred to [facility] for worsening respiratory status and placed on bipap 3/4 - respiratory status improved, transferred out to SDU 3/7 - started on flecainide for A flutter   Assessment and Plan: Toni [name] is a 79 y.o. female presenting with palpatations and shortness of breath found to be in atrial fibrillation with RVR. PMH is significant for COPD, paroxysmal AF (not on anticoag), hypertension, hypothyroidism. Etiology of dyspnea certainly could be multifactorial but likely is due to Afib with RVR.    # Afib with RVR: history of paroxysmal afib and anticoagulation treatment with pradaxa.  - telemetry and continuous pulse ox  - daily ASA 81mg   - LDL 155, no statin as age >76 - Mag 1.5, s/p 2g bolus 3/2 - Echo: EF 55-60%.  - on xarelto for anticoag given CHADVASC 4 - continue diltiazem  240 po - Pt with intermittent atrial flutter after being started on flecainide 3/8 per cardiology recommendations; pt is asymptomatic and anti-coagulated, so likely appropriate for discharge if cardiologist agreeable - Very much appreciate the efforts and recommendation of the cardiology team    # COPD: previously finished 1 week steroids 2/23. Repeat CXRs with no evidence of infiltrate, just COPD changes - continue PO prednisone, plan for 8 day taper - atrovent daily, switch symbicort inhaler to budesonide/arformoterol nebulizer BID - Q4 hr duonebs - stable on room air - continue PO levaquin for exacerbation, last day 3/9  # AKI: admission 1.18 (GFR 44),  now resolved creat but BUN still slightly elevated (GFR 67)  # Hypokalemia, resolved:  - stable at 3.9  # Hyperglycemia - resolved - A1c 5.7  # Hypothyroidism:  - on synthroid 86mcg daily  - TSH 0.585  # Hypertension: on lasix 20mg  qday, hctz, diltiazem 120mg  24hr at home  - continue home lasix, hold hctz - continue dilt 240 qd   # Lung nodule: seen on admission xray - previously worked up, repeat PET scan scheduled at [facility]  FEN/GI: diet heart, saline lock iv Prophylaxis: on xarelto  Disposition: discharge today if cardiology agreeable  Subjective: Feels good this morning, breathing comfortably, wants to go home  Objective: Temp:  [97.5 F (36.4 C)-97.8 F (36.6 C)] 97.8 F (36.6 C) (03/08 0522) Pulse Rate:  [71-122] 122 (03/08 0900) Resp:  [17-18] 18 (03/08 0522) BP: (108-118)/(62-75) 108/74 mmHg (03/08 0522) SpO2:  [93 %-98 %] 93 % (03/08 0756) Weight:  [178 lb 9.2 oz (81 kg)] 178 lb 9.2 oz (81 kg) (03/08 0522)  Physical Exam: Gen: no respiratory distress this morning, alert, nasal canula in place HEENT: NCAT CV: intermittent sinus tachycardia with possible atrial flutter  Resp: scattered expiratory wheezes throughout all lung fields, good air movement, normal WOB Abd: SNTND, no guarding or organomegaly Ext: No edema, warm, 2+ DP pulses, varicose veins Neuro: Alert and oriented, No gross deficits  Laboratory:  Recent Labs Lab 11/20/13 0215 11/22/13 0328 11/23/13 0435  WBC 8.6 7.5 7.7  HGB 12.0 11.1* 11.7*  HCT 37.1 33.3* 35.9*  PLT 229 241 259    Recent Labs Lab 11/20/13 0215 11/22/13 0328 11/23/13 0435  NA 143 139 139  K 3.9 3.9 4.2  CL 99 98 100  CO2 30 29 28   BUN 36* 40* 36*  CREATININE 0.83 0.92 0.90  CALCIUM 9.2 9.2 8.9  GLUCOSE 140* 104* 86    11/16/2013 01:17  Cholesterol 220 (H)  Triglycerides 58  HDL 53  LDL (calc) 155 (H)  VLDL 12  Total CHOL/HDL Ratio 4.2   Imaging/Diagnostic Tests: CXR 2/28: No acute cardiopulmonary  disease. CXR 3/2: COPD changes without evidence of acute cardiopulmonary disease. CXR 3/3: 1. Mild diffuse peribronchial cuffing may suggest bronchitis. 2. Atherosclerosis. CXR 3/5: Bilateral peribronchial thickening is again noted which is unchanged compared to prior exam. No other significant pulmonary abnormality is noted.  Angelica Ran, MD 11/23/2013, 9:17 AM PGY-3, [place] Intern pager: [phone], text pages welcome

## 2013-11-23 NOTE — Progress Notes (Signed)
FMTS Attending Note Patient seen and examined by me, discussed with Dr Maricela Bo and I agree with his assessment and plan. Patient reports improvement in her respiratory status, does not report symptoms of her A flutter with RVR.  She continues to be tachycardic.  Now on oral diltiazem and flecainide.  Appreciate Cardiology input into controlling her heart rate before eventual discharge.  Dalbert Mayotte, MD

## 2013-11-23 NOTE — Progress Notes (Signed)
Patient ID: Toni [name], female   DOB: January 01, 1935, 79 y.o.   MRN: [id number]  Patient Name: Toni [name] Date of Encounter: 11/23/2013     Active Problems:   COPD (chronic obstructive pulmonary disease)   Atrial fibrillation with RVR   Atypical atrial flutter   COPD exacerbation   Acute respiratory failure    SUBJECTIVE Denies any change in symptoms. No palpitations or chest pain, she looks dyspneic though denies sob  CURRENT MEDS . beclomethasone  2 puff Inhalation BID  . diltiazem  240 mg Oral Daily  . flecainide  50 mg Oral Q12H  . insulin aspart  0-15 Units Subcutaneous TID WC  . insulin aspart  0-5 Units Subcutaneous QHS  . ipratropium-albuterol  3 mL Nebulization Q6H  . levofloxacin  500 mg Oral Daily  . levothyroxine  75 mcg Oral QAC breakfast  . predniSONE  30 mg Oral Q breakfast  . rivaroxaban  20 mg Oral Q supper    OBJECTIVE  Filed Vitals:   11/22/13 1947 11/23/13 0522 11/23/13 0756 11/23/13 0900  BP: 115/75 108/74    Pulse: 102 106  122  Temp:  97.8 F (36.6 C)    TempSrc:  Oral    Resp: 17 18    Height:      Weight:  178 lb 9.2 oz (81 kg)    SpO2: 96% 95% 93%     Intake/Output Summary (Last 24 hours) at 11/23/13 1144 Last data filed at 11/23/13 0900  Gross per 24 hour  Intake    960 ml  Output      0 ml  Net    960 ml   Filed Weights   11/21/13 0431 11/22/13 0618 11/23/13 0522  Weight: 181 lb (82.101 kg) 178 lb 5.6 oz (80.9 kg) 178 lb 9.2 oz (81 kg)    PHYSICAL EXAM  General: Pleasant, NAD. Neuro: Alert and oriented X 3. Moves all extremities spontaneously. HEENT:  Normal  Neck: Supple without bruits or JVD. Lungs:  Mildly labored, scattered wheezes and rales Heart: IReg tachycardia, no s3, s4, or murmurs. Abdomen: Soft, non-tender, non-distended, BS + x 4.  Extremities: No clubbing, cyanosis or edema. DP/PT/Radials 2+ and equal bilaterally.  Accessory Clinical Findings  CBC  Recent Labs  11/22/13 0328 11/23/13 0435  WBC 7.5 7.7   NEUTROABS 5.8 5.2  HGB 11.1* 11.7*  HCT 33.3* 35.9*  MCV 89.0 89.3  PLT 241 846   Basic Metabolic Panel  Recent Labs  11/22/13 0328 11/23/13 0435  NA 139 139  K 3.9 4.2  CL 98 100  CO2 29 28  GLUCOSE 104* 86  BUN 40* 36*  CREATININE 0.92 0.90  CALCIUM 9.2 8.9  MG 2.0 1.9  PHOS 2.7 2.4   Liver Function Tests No results found for this basename: AST, ALT, ALKPHOS, BILITOT, PROT, ALBUMIN,  in the last 72 hours No results found for this basename: LIPASE, AMYLASE,  in the last 72 hours Cardiac Enzymes No results found for this basename: CKTOTAL, CKMB, CKMBINDEX, TROPONINI,  in the last 72 hours BNP No components found with this basename: POCBNP,  D-Dimer No results found for this basename: DDIMER,  in the last 72 hours Hemoglobin A1C No results found for this basename: HGBA1C,  in the last 72 hours Fasting Lipid Panel No results found for this basename: CHOL, HDL, LDLCALC, TRIG, CHOLHDL, LDLDIRECT,  in the last 72 hours Thyroid Function Tests No results found for this basename: TSH, T4TOTAL, FREET3, T3FREE, THYROIDAB,  in  the last 72 hours  TELE  atrial flutter with an RVR   Radiology/Studies  Dg Chest 2 View  11/17/2013   CLINICAL DATA:  Cough, copd  EXAM: CHEST  2 VIEW  COMPARISON:  DG CHEST 1V PORT dated 11/15/2013  FINDINGS: The heart size and mediastinal contours are within normal limits. Atherosclerotic calcifications appreciated within aorta. Lungs are clear. There is increased AP diameter of the chest. The osseous structures demonstrate degenerative changes in the shoulders.  IMPRESSION: COPD changes without evidence of acute cardiopulmonary disease.   Electronically Signed   By: Margaree Mackintosh M.D.   On: 11/17/2013 11:52   Dg Chest Port 1 View  11/20/2013   CLINICAL DATA:  Shortness of breath, cough.  EXAM: PORTABLE CHEST - 1 VIEW  COMPARISON:  November 18, 2013.  FINDINGS: Stable cardiomediastinal silhouette. No pleural effusion or pneumothorax is noted. Stable mild  bilateral peribronchial thickening is again noted. No significant airspace opacity is noted. Narrowing of the left acromial humeral space is noted suggesting rotator cuff injury.  IMPRESSION: Bilateral peribronchial thickening is again noted which is unchanged compared to prior exam. No other significant pulmonary abnormality is noted.   Electronically Signed   By: Sabino Dick M.D.   On: 11/20/2013 09:14   Dg Chest Port 1 View  11/18/2013   CLINICAL DATA:  Respiratory distress.  EXAM: PORTABLE CHEST - 1 VIEW  COMPARISON:  Chest x-ray 11/17/2013.  FINDINGS: Lung volumes are low. No acute consolidative airspace disease. Diffuse peribronchial cuffing. Pulmonary vasculature appears within normal limits. Heart size is borderline enlarged. The patient is rotated to the right on today's exam, resulting in distortion of the mediastinal contours and reduced diagnostic sensitivity and specificity for mediastinal pathology. Atherosclerosis in the thoracic aorta.  IMPRESSION: 1. Mild diffuse peribronchial cuffing may suggest bronchitis. 2. Atherosclerosis.   Electronically Signed   By: Vinnie Langton M.D.   On: 11/18/2013 12:16   Dg Chest Portable 1 View  11/15/2013   CLINICAL DATA:  Short of breath for several days.  EXAM: PORTABLE CHEST - 1 VIEW  COMPARISON:  11/12/2008  FINDINGS: Cardiac silhouette is normal in size. Aorta is mildly uncoiled. No mediastinal or hilar masses.  Small ill-defined nodule in the right mid lung is stable from the prior exam. No lung consolidation or edema. No pleural effusion or pneumothorax.  The bony thorax is diffusely demineralized.  IMPRESSION: No acute cardiopulmonary disease.   Electronically Signed   By: Lajean Manes M.D.   On: 11/15/2013 19:55    ASSESSMENT AND PLAN 1. Recurrent atrial flutter - if she is discharged she will come back sooner than later. She is on diltiazem. She is anti-coagulated. She has not responded to flecainide yet. Will uptitrate flecainide. If she does  not slow down, she will require AV node ablation and PPM.   Gregg Taylor,M.D.  11/23/2013 11:44 AM

## 2013-11-24 ENCOUNTER — Encounter (HOSPITAL_COMMUNITY): Payer: Self-pay | Admitting: Internal Medicine

## 2013-11-24 LAB — GLUCOSE, CAPILLARY
GLUCOSE-CAPILLARY: 102 mg/dL — AB (ref 70–99)
GLUCOSE-CAPILLARY: 93 mg/dL (ref 70–99)
GLUCOSE-CAPILLARY: 87 mg/dL (ref 70–99)

## 2013-11-24 LAB — BASIC METABOLIC PANEL
BUN: 38 mg/dL — ABNORMAL HIGH (ref 6–23)
CHLORIDE: 103 meq/L (ref 96–112)
CO2: 29 mEq/L (ref 19–32)
Calcium: 8.9 mg/dL (ref 8.4–10.5)
Creatinine, Ser: 0.96 mg/dL (ref 0.50–1.10)
GFR calc Af Amer: 65 mL/min — ABNORMAL LOW (ref >90)
GFR calc non Af Amer: 56 mL/min — ABNORMAL LOW (ref >90)
Glucose, Bld: 85 mg/dL (ref 70–99)
POTASSIUM: 4.2 meq/L (ref 3.7–5.3)
SODIUM: 143 meq/L (ref 137–147)

## 2013-11-24 LAB — CBC WITH DIFFERENTIAL/PLATELET
BASOS ABS: 0.1 10*3/uL (ref 0.0–0.1)
Basophils Relative: 1 % (ref 0–1)
Eosinophils Absolute: 0.1 10*3/uL (ref 0.0–0.7)
Eosinophils Relative: 1 % (ref 0–5)
HCT: 34.5 % — ABNORMAL LOW (ref 36.0–46.0)
Hemoglobin: 11.6 g/dL — ABNORMAL LOW (ref 12.0–15.0)
Lymphocytes Relative: 30 % (ref 12–46)
Lymphs Abs: 2.4 10*3/uL (ref 0.7–4.0)
MCH: 29.4 pg (ref 26.0–34.0)
MCHC: 33.6 g/dL (ref 30.0–36.0)
MCV: 87.6 fL (ref 78.0–100.0)
MONOS PCT: 10 % (ref 3–12)
Monocytes Absolute: 0.8 10*3/uL (ref 0.1–1.0)
NEUTROS PCT: 58 % (ref 43–77)
Neutro Abs: 4.7 10*3/uL (ref 1.7–7.7)
PLATELETS: 278 10*3/uL (ref 150–400)
RBC: 3.94 MIL/uL (ref 3.87–5.11)
RDW: 16.3 % — AB (ref 11.5–15.5)
WBC: 8.1 10*3/uL (ref 4.0–10.5)

## 2013-11-24 LAB — MAGNESIUM: Magnesium: 1.9 mg/dL (ref 1.5–2.5)

## 2013-11-24 LAB — PHOSPHORUS: Phosphorus: 2.9 mg/dL (ref 2.3–4.6)

## 2013-11-24 MED ORDER — LEVOFLOXACIN 500 MG PO TABS: 500.0000 mg | ORAL_TABLET | Freq: Every day | ORAL | Status: DC

## 2013-11-24 MED ORDER — FLECAINIDE ACETATE 100 MG PO TABS: 100.0000 mg | ORAL_TABLET | Freq: Two times a day (BID) | ORAL | Status: DC

## 2013-11-24 MED ORDER — DILTIAZEM HCL ER BEADS 300 MG PO CP24: 300.0000 mg | ORAL_CAPSULE | Freq: Every day | ORAL | Status: DC

## 2013-11-24 MED ORDER — IPRATROPIUM-ALBUTEROL 0.5-2.5 (3) MG/3ML IN SOLN: 3.0000 mL | RESPIRATORY_TRACT | Status: DC

## 2013-11-24 MED ORDER — HYDROXYZINE HCL 50 MG PO TABS: 50.0000 mg | ORAL_TABLET | Freq: Three times a day (TID) | ORAL | Status: DC | PRN

## 2013-11-24 MED ORDER — FUROSEMIDE 20 MG PO TABS
20.0000 mg | ORAL_TABLET | Freq: Every day | ORAL | Status: DC
  Administered 2013-11-24: 20 mg via ORAL
  Filled 2013-11-24: qty 1

## 2013-11-24 NOTE — Progress Notes (Signed)
Patient requesting O2 be applied, room air sat 95%, states she uses oxygen, as needed at home, O2 2liters applied, O2 sat 98 Toni [name]

## 2013-11-24 NOTE — Plan of Care (Signed)
Problem: Phase III Progression Outcomes Goal: Sinus rhythm established or heart rate < 100 at rest Outcome: Not Applicable Date Met:  67/12/45 Remains atrial fib, heart rate 95

## 2013-11-24 NOTE — Progress Notes (Signed)
Patient ID: Neeta Storey, female   DOB: 09/28/1934, 79 y.o.   MRN: [id number]  Patient Name: Toni [name] Date of Encounter: 11/24/2013     Active Problems:   COPD (chronic obstructive pulmonary disease)   Atrial fibrillation with RVR   Atypical atrial flutter   COPD exacerbation   Acute respiratory failure    SUBJECTIVE Denies any change in symptoms. No palpitations or chest pain, she looks dyspneic though denies sob  CURRENT MEDS . beclomethasone  2 puff Inhalation BID  . diltiazem  240 mg Oral Daily  . flecainide  100 mg Oral Q12H  . furosemide  20 mg Oral Daily  . insulin aspart  0-15 Units Subcutaneous TID WC  . insulin aspart  0-5 Units Subcutaneous QHS  . ipratropium-albuterol  3 mL Nebulization Q6H  . levofloxacin  500 mg Oral Daily  . levothyroxine  75 mcg Oral QAC breakfast  . predniSONE  30 mg Oral Q breakfast  . rivaroxaban  20 mg Oral Q supper    OBJECTIVE  Filed Vitals:   11/24/13 0137 11/24/13 0300 11/24/13 0500 11/24/13 0738  BP:   108/73   Pulse:  103 102 108  Temp:   97.9 F (36.6 C)   TempSrc:   Oral   Resp:   17 18  Height:      Weight:   178 lb 9.2 oz (81 kg)   SpO2: 95%  94% 100%    Intake/Output Summary (Last 24 hours) at 11/24/13 0947 Last data filed at 11/23/13 1430  Gross per 24 hour  Intake    480 ml  Output      0 ml  Net    480 ml   Filed Weights   11/22/13 0618 11/23/13 0522 11/24/13 0500  Weight: 178 lb 5.6 oz (80.9 kg) 178 lb 9.2 oz (81 kg) 178 lb 9.2 oz (81 kg)    PHYSICAL EXAM  General: Pleasant, NAD. Neuro: Alert and oriented X 3. Moves all extremities spontaneously. HEENT:  Normal  Neck: Supple without bruits or JVD. Lungs:  Mildly labored, scattered wheezes and rales Heart:  Abdomen: Soft, non-tender, non-distended, BS + x 4.  Extremities: No clubbing, cyanosis or edema. DP/PT/Radials 2+ and equal bilaterally.  Accessory Clinical Findings  CBC  Recent Labs  11/23/13 0435 11/24/13 0650  WBC 7.7 8.1   NEUTROABS 5.2 4.7  HGB 11.7* 11.6*  HCT 35.9* 34.5*  MCV 89.3 87.6  PLT 259 329   Basic Metabolic Panel  Recent Labs  11/23/13 0435 11/24/13 0650  NA 139 143  K 4.2 4.2  CL 100 103  CO2 28 29  GLUCOSE 86 85  BUN 36* 38*  CREATININE 0.90 0.96  CALCIUM 8.9 8.9  MG 1.9 1.9  PHOS 2.4 2.9   Liver Function Tests No results found for this basename: AST, ALT, ALKPHOS, BILITOT, PROT, ALBUMIN,  in the last 72 hours No results found for this basename: LIPASE, AMYLASE,  in the last 72 hours Cardiac Enzymes No results found for this basename: CKTOTAL, CKMB, CKMBINDEX, TROPONINI,  in the last 72 hours BNP No components found with this basename: POCBNP,  D-Dimer No results found for this basename: DDIMER,  in the last 72 hours Hemoglobin A1C No results found for this basename: HGBA1C,  in the last 72 hours Fasting Lipid Panel No results found for this basename: CHOL, HDL, LDLCALC, TRIG, CHOLHDL, LDLDIRECT,  in the last 72 hours Thyroid Function Tests No results found for this basename: TSH, T4TOTAL, FREET3,  T3FREE, THYROIDAB,  in the last 72 hours  TELE  atrial flutter with an RVR   Radiology/Studies  Dg Chest 2 View  11/17/2013   CLINICAL DATA:  Cough, copd  EXAM: CHEST  2 VIEW  COMPARISON:  DG CHEST 1V PORT dated 11/15/2013  FINDINGS: The heart size and mediastinal contours are within normal limits. Atherosclerotic calcifications appreciated within aorta. Lungs are clear. There is increased AP diameter of the chest. The osseous structures demonstrate degenerative changes in the shoulders.  IMPRESSION: COPD changes without evidence of acute cardiopulmonary disease.   Electronically Signed   By: Margaree Mackintosh M.D.   On: 11/17/2013 11:52   Dg Chest Port 1 View  11/20/2013   CLINICAL DATA:  Shortness of breath, cough.  EXAM: PORTABLE CHEST - 1 VIEW  COMPARISON:  November 18, 2013.  FINDINGS: Stable cardiomediastinal silhouette. No pleural effusion or pneumothorax is noted. Stable mild  bilateral peribronchial thickening is again noted. No significant airspace opacity is noted. Narrowing of the left acromial humeral space is noted suggesting rotator cuff injury.  IMPRESSION: Bilateral peribronchial thickening is again noted which is unchanged compared to prior exam. No other significant pulmonary abnormality is noted.   Electronically Signed   By: Sabino Dick M.D.   On: 11/20/2013 09:14   Dg Chest Port 1 View  11/18/2013   CLINICAL DATA:  Respiratory distress.  EXAM: PORTABLE CHEST - 1 VIEW  COMPARISON:  Chest x-ray 11/17/2013.  FINDINGS: Lung volumes are low. No acute consolidative airspace disease. Diffuse peribronchial cuffing. Pulmonary vasculature appears within normal limits. Heart size is borderline enlarged. The patient is rotated to the right on today's exam, resulting in distortion of the mediastinal contours and reduced diagnostic sensitivity and specificity for mediastinal pathology. Atherosclerosis in the thoracic aorta.  IMPRESSION: 1. Mild diffuse peribronchial cuffing may suggest bronchitis. 2. Atherosclerosis.   Electronically Signed   By: Vinnie Langton M.D.   On: 11/18/2013 12:16   Dg Chest Portable 1 View  11/15/2013   CLINICAL DATA:  Short of breath for several days.  EXAM: PORTABLE CHEST - 1 VIEW  COMPARISON:  11/12/2008  FINDINGS: Cardiac silhouette is normal in size. Aorta is mildly uncoiled. No mediastinal or hilar masses.  Small ill-defined nodule in the right mid lung is stable from the prior exam. No lung consolidation or edema. No pleural effusion or pneumothorax.  The bony thorax is diffusely demineralized.  IMPRESSION: No acute cardiopulmonary disease.   Electronically Signed   By: Lajean Manes M.D.   On: 11/15/2013 19:55    ASSESSMENT AND PLAN 1. Recurrent atrial flutter -  Continue Flecainide at current dose.  Will ask EP to weight in on this.  Philp Bayleigh Loflin, jr.   11/24/2013 9:47 AM

## 2013-11-24 NOTE — Progress Notes (Signed)
FMTS Attending Daily Note:  Annabell Sabal MD  [phone] pager  Family Practice pager:  [phone] I have seen and examined this patient and have reviewed their chart. I have discussed this patient with the resident. I agree with the resident's findings, assessment and care plan.  Additionally:  Patient very ready for discharge subjectively.  On oxygen, but pulse ox 97% Seen by cards EP -- plan is for outpatient [place] home today  Toni Reasons, MD 11/24/2013 3:07 PM

## 2013-11-24 NOTE — Progress Notes (Signed)
Patient Name: Toni [name]      SUBJECTIVE: breathing at baseline and without awareness of tachycardia  Past Medical History  Diagnosis Date  . Cataract   . COPD (chronic obstructive pulmonary disease)   . Essential hypertension, benign   . Hypothyroidism   . Atypical atrial flutter 11/16/2013    Scheduled Meds:  Scheduled Meds: . beclomethasone  2 puff Inhalation BID  . diltiazem  240 mg Oral Daily  . flecainide  100 mg Oral Q12H  . furosemide  20 mg Oral Daily  . insulin aspart  0-15 Units Subcutaneous TID WC  . insulin aspart  0-5 Units Subcutaneous QHS  . ipratropium-albuterol  3 mL Nebulization Q6H  . levothyroxine  75 mcg Oral QAC breakfast  . predniSONE  30 mg Oral Q breakfast  . rivaroxaban  20 mg Oral Q supper   Continuous Infusions:   PHYSICAL EXAM Filed Vitals:   11/24/13 0137 11/24/13 0300 11/24/13 0500 11/24/13 0738  BP:   108/73   Pulse:  103 102 108  Temp:   97.9 F (36.6 C)   TempSrc:   Oral   Resp:   17 18  Height:      Weight:   178 lb 9.2 oz (81 kg)   SpO2: 95%  94% 100%    General appearance: alert, cooperative and mild distress Lungs: diminished breath sounds bilaterally Heart: regular rate and rhythm Abdomen: soft, non-tender; bowel sounds normal; no masses,  no organomegaly Extremities: edema tr Skin: Skin color, texture, turgor normal. No rashes or lesions  TELEMETRY: Reviewed telemetry pt in Sflutter with variable block:    Intake/Output Summary (Last 24 hours) at 11/24/13 1306 Last data filed at 11/24/13 1019  Gross per 24 hour  Intake    960 ml  Output      0 ml  Net    960 ml    LABS: Basic Metabolic Panel:  Recent Labs Lab 11/18/13 0345 11/19/13 0630 11/20/13 0215  11/22/13 0328 11/23/13 0435 11/24/13 0650  NA 142 143 143  --  139 139 143  K 3.9 3.9 3.9  --  3.9 4.2 4.2  CL 97 99 99  --  98 100 103  CO2 32 31 30  --  29 28 29   GLUCOSE 100* 131* 140*  --  104* 86 85  BUN 46* 31* 36*  --  40* 36* 38*    CREATININE 1.20* 0.76 0.83  --  0.92 0.90 0.96  CALCIUM 9.1 9.0 9.2  --  9.2 8.9 8.9  MG 2.0  --   --   --  2.0 1.9 1.9  PHOS  --   --   --   < > 2.7 2.4 2.9  < > = values in this interval not displayed. Cardiac Enzymes: No results found for this basename: CKTOTAL, CKMB, CKMBINDEX, TROPONINI,  in the last 72 hours CBC:  Recent Labs Lab 11/18/13 1200 11/19/13 0630 11/20/13 0215 11/22/13 0328 11/23/13 0435 11/24/13 0650  WBC 11.7* 6.0 8.6 7.5 7.7 8.1  NEUTROABS  --   --   --  5.8 5.2 4.7  HGB 11.7* 11.5* 12.0 11.1* 11.7* 11.6*  HCT 36.3 35.7* 37.1 33.3* 35.9* 34.5*  MCV 90.5 89.7 90.0 89.0 89.3 87.6  PLT 205 189 229 241 259 278   BNP: BNP (last 3 results)  Recent Labs  11/15/13 1941 11/22/13 0328  PROBNP 2417.0* 2119.0*     ASSESSMENT AND PLAN:  Active Problems:  COPD (chronic obstructive pulmonary disease)   Atypical atrial flutter   COPD exacerbation   Acute respiratory failure  Atrial flutter with variable VR  Just started on Rivaroxaban so DCCV would best be deferred Will increase Dilt and see if we can better rate control  300 qd  Followup to est with [facility] 2 weeks with anticipated DCCV in 3 weeks  Signed, Virl Axe MD  11/24/2013

## 2013-11-24 NOTE — Discharge Summary (Signed)
[facility] Discharge Summary  Patient name: Toni [name] Medical record number: [id number] Date of birth: 1935/06/04 Age: 79 y.o. Gender: female Date of Admission: 11/15/2013  Date of Discharge: 11/24/2013  Admitting Physician: Andrena Mews, MD  Primary Care Provider: Tomma Rakers, PA-C Consultants: cardiology, CCM  Indication for Hospitalization: dyspnea, afib with RVR  Discharge Diagnoses/Problem List:  Patient Active Problem List   Diagnosis Date Noted  . COPD exacerbation 11/18/2013  . Acute respiratory failure 11/18/2013  . Atypical atrial flutter 11/16/2013  . COPD (chronic obstructive pulmonary disease) 11/15/2013    Disposition: home  Discharge Condition: improved  Brief Hospital Course: Toni [name] is a 79 y.o. female presenting with palpatations and shortness of breath found to be in atrial fibrillation with RVR. PMH is significant for COPD, paroxysmal AF (not on anticoag), hypertension, hypothyroidism.   # Afib with RVR: Pt presented in atrial fibrillation with RVR. She was initially placed on a diltiazem dripped and transitioned to PO dilt the following day. Later in her stay she developed AFRVR again on this treatment and flecainade was added. On the day of discharge her diltiazem was further increased to 300mg  daily for improved rate controlled. She was started on xarelto during this hospital stay for her atrial fibrillation. Cardiology was consulted and very involved in the titration/selection of these medications.  # COPD: Pt has a history of severe COPD on multiple controllers at home. She was admitted with a likely exacerbation based on her cough and dyspnea but no evidence of pneumonia on xray. She was treated with scheduled duonebs, systemic steroids, and antibiotics. Nebulized ipratropium was added for improved control at home. She was discharged on this in addition to her previous meds, prednisone taper and levaquin.  Issues  for Follow Up:  # Transitioned all inhaled meds to nebulizers based on severity of her disease on pulm rec. Please make sure she is adherent to all nebulizers to prevent further exacerbations.  # Afib/RVR: F/u rate control and cardiology follow-up for possible cardioversion in the future when adequately anticoagulated.  # Lung nodule: Pt reports she has follow-up with rpt PET scan coming up, make sure this is followed up on.  Significant Procedures: none  Significant Labs and Imaging:   Recent Labs Lab 11/22/13 0328 11/23/13 0435 11/24/13 0650  WBC 7.5 7.7 8.1  HGB 11.1* 11.7* 11.6*  HCT 33.3* 35.9* 34.5*  PLT 241 259 278    Recent Labs Lab 11/18/13 0345 11/19/13 0630 11/20/13 0215 11/22/13 0328 11/23/13 0435 11/24/13 0650  NA 142 143 143 139 139 143  K 3.9 3.9 3.9 3.9 4.2 4.2  CL 97 99 99 98 100 103  CO2 32 31 30 29 28 29   GLUCOSE 100* 131* 140* 104* 86 85  BUN 46* 31* 36* 40* 36* 38*  CREATININE 1.20* 0.76 0.83 0.92 0.90 0.96  CALCIUM 9.1 9.0 9.2 9.2 8.9 8.9  MG 2.0  --   --  2.0 1.9 1.9  PHOS  --   --   --  2.7 2.4 2.9   CXR 2/28: No acute cardiopulmonary disease.  CXR 3/2: COPD changes without evidence of acute cardiopulmonary disease.  CXR 3/3: 1. Mild diffuse peribronchial cuffing may suggest bronchitis. 2. Atherosclerosis.  CXR 3/5: Bilateral peribronchial thickening is again noted which is unchanged compared to prior exam. No other significant pulmonary abnormality is noted.  Results/Tests Pending at Time of Discharge: none  Discharge Medications:    Medication List    STOP taking  these medications       budesonide-formoterol 160-4.5 MCG/ACT inhaler  Commonly known as:  SYMBICORT     ipratropium 17 MCG/ACT inhaler  Commonly known as:  ATROVENT HFA      TAKE these medications       albuterol (2.5 MG/3ML) 0.083% nebulizer solution  Commonly known as:  PROVENTIL  Take 3 mLs (2.5 mg total) by nebulization every 2 (two) hours as needed for wheezing  or shortness of breath.     ALPRAZolam 0.5 MG tablet  Commonly known as:  XANAX  Take 0.5 mg by mouth 2 (two) times daily as needed for anxiety.     aspirin EC 81 MG EC tablet  Generic drug:  aspirin  Take 81 mg by mouth daily. Swallow whole.     budesonide 0.5 MG/2ML nebulizer solution  Commonly known as:  PULMICORT  Take 2 mLs (0.5 mg total) by nebulization 2 (two) times daily.     diltiazem 300 MG 24 hr capsule  Commonly known as:  TIAZAC  Take 1 capsule (300 mg total) by mouth daily.     flecainide 100 MG tablet  Commonly known as:  TAMBOCOR  Take 1 tablet (100 mg total) by mouth 2 (two) times daily.     formoterol 20 MCG/2ML nebulizer solution  Commonly known as:  PERFOROMIST  Take 2 mLs (20 mcg total) by nebulization 2 (two) times daily.     furosemide 20 MG tablet  Commonly known as:  LASIX  Take 20 mg by mouth daily.     hydrochlorothiazide 25 MG tablet  Commonly known as:  HYDRODIURIL  Take 25 mg by mouth daily.     HYDROcodone-acetaminophen 5-325 MG per tablet  Commonly known as:  NORCO/VICODIN  Take 1 tablet by mouth every 6 (six) hours as needed for moderate pain.     hydrOXYzine 50 MG tablet  Commonly known as:  ATARAX/VISTARIL  Take 1 tablet (50 mg total) by mouth 3 (three) times daily as needed for anxiety.     ipratropium-albuterol 0.5-2.5 (3) MG/3ML Soln  Commonly known as:  DUONEB  Take 3 mLs by nebulization every 4 (four) hours.     levofloxacin 500 MG tablet  Commonly known as:  LEVAQUIN  Take 1 tablet (500 mg total) by mouth daily.     levothyroxine 75 MCG tablet  Commonly known as:  SYNTHROID, LEVOTHROID  Take 75 mcg by mouth daily before breakfast.     loratadine 10 MG tablet  Commonly known as:  CLARITIN  Take 10 mg by mouth daily.     omeprazole 20 MG capsule  Commonly known as:  PRILOSEC  Take 20 mg by mouth daily.     potassium chloride 10 MEQ tablet  Commonly known as:  K-DUR,KLOR-CON  Take 20 mEq by mouth 2 (two) times  daily.     predniSONE 10 MG tablet  Commonly known as:  DELTASONE  Take 4 tabs daily on 3/7 and 3/8, then take 3 tabs daily on 3/9 and 3/10, then 2 tabs daily on 3/11 and 3/12, then 1 tab daily on 3/13 and 3/14.     Rivaroxaban 20 MG Tabs tablet  Commonly known as:  XARELTO  Take 1 tablet (20 mg total) by mouth daily with supper.        Discharge Instructions: Please refer to Patient Instructions section of EMR for full details.  Patient was counseled important signs and symptoms that should prompt return to medical care, changes in medications, dietary  instructions, activity restrictions, and follow up appointments.   Follow-Up Appointments: Follow-up Information   Follow up with PULLIAM, CHARLES C, PA-C. Schedule an appointment as soon as possible for a visit in 1 week.   Specialty:  Physician Assistant   Contact information:   [address] [place] [address] [phone]       Follow up with Ida Rogue, MD On 12/04/2013. (At 11:30 AM for hospital follow-up to establish cardiology care)    Specialty:  Cardiology   Contact information:   [place] [place] [address] [phone]       Beverlyn Roux, MD 11/24/2013, 4:12 PM PGY-1, [place]

## 2013-11-24 NOTE — Progress Notes (Signed)
Reviewed dc instructions with patient and family, questions answered dc home with instructions prescriptions and belongings Toni [name]

## 2013-11-24 NOTE — Progress Notes (Signed)
Family Medicine Teaching Service Daily Progress Note Intern Pager: [phone]  Patient name: Toni [name] Medical record number: [id number] Date of birth: [birth year] Age: 79 y.o. Gender: female  Primary Care Provider: Tomma Rakers, PA-C Consultants: Cardiology, Pulmonology Code Status: Full  Pt Overview and Major Events to Date:  2/28 - admitted with Afib RVR started on diltiazem drip 3/1 - dyspnea improved 3/2 - dyspnea worsened 3/3 - transferred to [facility] for worsening respiratory status and placed on bipap 3/4 - respiratory status improved, transferred out to SDU 3/7 - started on flecainide for A flutter   Assessment and Plan: Toni [name] is a 79 y.o. female presenting with palpatations and shortness of breath found to be in atrial fibrillation with RVR. PMH is significant for COPD, paroxysmal AF (not on anticoag), hypertension, hypothyroidism. Etiology of dyspnea certainly could be multifactorial but likely is due to Afib with RVR.    # Afib with RVR: history of paroxysmal afib and anticoagulation treatment with pradaxa.  - telemetry and continuous pulse ox  - daily ASA 81mg   - LDL 155, no statin as age >74 - Mag 1.5, s/p 2g bolus 3/2 - Echo: EF 55-60%.  - on xarelto for anticoag given CHADVASC 4 - continue diltiazem  240 po - Pt with intermittent atrial flutter after being started on flecainide 3/8, uptitration per cardiology - Very much appreciate the efforts and recommendation of the cardiology team    # COPD: previously finished 1 week steroids 2/23. Repeat CXRs with no evidence of infiltrate, just COPD changes - continue PO prednisone, plan for 8 day taper - atrovent daily, switch symbicort inhaler to budesonide/arformoterol nebulizer BID - Q4 hr duonebs - stable on room air - continue PO levaquin for exacerbation, last day today  # AKI: admission 1.18 (GFR 44), now resolved creat but BUN still slightly elevated (GFR 67)  # Hypokalemia, resolved:  - stable at  3.9  # Hyperglycemia - resolved - A1c 5.7  # Hypothyroidism:  - on synthroid 71mcg daily  - TSH 0.585  # Hypertension: on lasix 20mg  qday, hctz, diltiazem 120mg  24hr at home  - continue home lasix, hold hctz - continue dilt 240 qd   # Lung nodule: seen on admission xray - previously worked up, repeat PET scan scheduled at [facility]  FEN/GI: diet heart, saline lock iv Prophylaxis: on xarelto  Disposition: discharge when heart rate stable, <110 for >24 hours, discharge if cleared by cards  Subjective: Feels good this morning, breathing comfortably, wants to go home  Objective: Temp:  [97.9 F (36.6 C)-98.2 F (36.8 C)] 97.9 F (36.6 C) (03/09 0500) Pulse Rate:  [90-108] 108 (03/09 0738) Resp:  [17-18] 18 (03/09 0738) BP: (108-114)/(63-73) 108/73 mmHg (03/09 0500) SpO2:  [94 %-100 %] 100 % (03/09 0738) Weight:  [178 lb 9.2 oz (81 kg)] 178 lb 9.2 oz (81 kg) (03/09 0500)  Physical Exam: Gen: no respiratory distress this morning, sitting up in chair, looking well HEENT: NCAT CV: intermittent sinus tachycardia with possible atrial flutter  Resp: scattered faint expiratory wheezes throughout all lung fields, good air movement, normal WOB Abd: SNTND, no guarding or organomegaly Ext: trace LE edema, warm, 2+ DP pulses, varicose veins Neuro: Alert and oriented, No gross deficits  Laboratory:  Recent Labs Lab 11/22/13 0328 11/23/13 0435 11/24/13 0650  WBC 7.5 7.7 8.1  HGB 11.1* 11.7* 11.6*  HCT 33.3* 35.9* 34.5*  PLT 241 259 278    Recent Labs Lab 11/22/13 0328 11/23/13 0435 11/24/13 4401  NA 139 139 143  K 3.9 4.2 4.2  CL 98 100 103  CO2 29 28 29   BUN 40* 36* 38*  CREATININE 0.92 0.90 0.96  CALCIUM 9.2 8.9 8.9  GLUCOSE 104* 86 85    11/16/2013 01:17  Cholesterol 220 (H)  Triglycerides 58  HDL 53  LDL (calc) 155 (H)  VLDL 12  Total CHOL/HDL Ratio 4.2   Imaging/Diagnostic Tests: CXR 2/28: No acute cardiopulmonary disease. CXR 3/2: COPD changes without  evidence of acute cardiopulmonary disease. CXR 3/3: 1. Mild diffuse peribronchial cuffing may suggest bronchitis. 2. Atherosclerosis. CXR 3/5: Bilateral peribronchial thickening is again noted which is unchanged compared to prior exam. No other significant pulmonary abnormality is noted.  Beverlyn Roux, MD 11/24/2013, 9:13 AM PGY-1, [place] Intern pager: [phone], text pages welcome

## 2013-11-26 NOTE — Discharge Summary (Signed)
Family Medicine Teaching Service  Discharge Note : Attending Jeff Kasson Lamere MD Pager [phone] Inpatient Team Pager:  [phone]  I have reviewed this patient and the patient's chart and have discussed discharge planning with the resident at the time of discharge. I agree with the discharge plan as above.    

## 2013-11-28 ENCOUNTER — Inpatient Hospital Stay: Payer: Medicare Other | Admitting: Adult Health

## 2013-12-04 ENCOUNTER — Encounter: Payer: Self-pay | Admitting: Cardiovascular Disease

## 2013-12-04 ENCOUNTER — Ambulatory Visit (INDEPENDENT_AMBULATORY_CARE_PROVIDER_SITE_OTHER): Payer: Medicare Other | Admitting: Cardiovascular Disease

## 2013-12-04 VITALS — BP 110/64 | HR 72 | Ht 61.0 in | Wt 178.5 lb

## 2013-12-04 DIAGNOSIS — R000 Tachycardia, unspecified: Secondary | ICD-10-CM

## 2013-12-04 DIAGNOSIS — I4892 Unspecified atrial flutter: Secondary | ICD-10-CM

## 2013-12-04 DIAGNOSIS — E785 Hyperlipidemia, unspecified: Secondary | ICD-10-CM | POA: Insufficient documentation

## 2013-12-04 DIAGNOSIS — I484 Atypical atrial flutter: Secondary | ICD-10-CM

## 2013-12-04 DIAGNOSIS — R609 Edema, unspecified: Secondary | ICD-10-CM

## 2013-12-04 DIAGNOSIS — I4891 Unspecified atrial fibrillation: Secondary | ICD-10-CM

## 2013-12-04 DIAGNOSIS — J441 Chronic obstructive pulmonary disease with (acute) exacerbation: Secondary | ICD-10-CM

## 2013-12-04 DIAGNOSIS — R600 Localized edema: Secondary | ICD-10-CM | POA: Insufficient documentation

## 2013-12-04 NOTE — Assessment & Plan Note (Addendum)
She remains in atrial flutter on today's visit, ventricular rate well controlled. Given continued COPD exacerbation, possible underlying bronchitis, we will not schedule a cardioversion as this would likely not be permanent with such severe underlying respiratory issues. As she is asymptomatic, we will continue her current medications for now. She will stay on anticoagulation

## 2013-12-04 NOTE — Assessment & Plan Note (Signed)
Underlying leg edema likely from chronic venous insufficiency, exacerbated by calcium channel blocker

## 2013-12-04 NOTE — Patient Instructions (Signed)
You are doing well. No medication changes were made.  For cholesterol: Consider trying the red yeast rice, oatmeal, weight loss  Constipation: MOM, miralex, citracel  Please call us if you have new issues that need to be addressed before your next appt.  Your physician wants you to follow-up in: 6 months.  You will receive a reminder letter in the mail two months in advance. If you don't receive a letter, please call our office to schedule the follow-up appointment.

## 2013-12-04 NOTE — Assessment & Plan Note (Signed)
She's not interested in statins. Recommended she start weight loss, fiber, oatmeal, consider red yeast rice

## 2013-12-04 NOTE — Assessment & Plan Note (Signed)
She has followup with pulmonary in 2 weeks' time. Clinical exam suggestive of continued COPD exacerbation, unable to rule out continue bronchitis. She has followup with primary care next week.

## 2013-12-04 NOTE — Progress Notes (Signed)
Patient ID: Toni [name], female    DOB: [birth year], 79 y.o.   MRN: [id number]  HPI Comments: Toni [name] is a 79 yo woman with long smoking hx, COPD, hyperlipidemia, presenting for followup after recent hospitalization with admission on 11/15/2013 for shortness of breath. Notes indicate a history of paroxysmal atrial fibrillation, not on anticoagulation by her choice in the past. Hospitalization at [facility] 09/26/2013 for COPD exacerbation, atrial fibrillation. Noted to be in atrial flutter 09/12/2013  In February he had bronchitis and was treated per the notes.  2/28 - admitted with Afib RVR started on diltiazem drip For COPD exacerbation she was treated with prednisone, Levaquin, nebulizers 3/1 - dyspnea improved 3/2 - dyspnea worsened 3/3 - transferred to CCM for worsening respiratory status and placed on bipap 3/4 - respiratory status improved, transferred out to SDU 3/7 - started on flecainide for A flutter  She was started on anticoagulation  In followup today, she has continued thick cough. She otherwise feels relatively well. She is taking xarelto 20 mg daily.   She reports that she stopped smoking 10-12 years ago. Total cholesterol 220, LDL 155. She does not want a cholesterol medication echocardiogram in the hospital last month showed ejection fraction 55-60%, moderately dilated left atrium and right atrium, mild aortic valve stenosis  EKG today shows atrial flutter with atrial rate of 200 beats per minute, ventricular rate 72 beats per minute        Outpatient Encounter Prescriptions as of 12/04/2013  Medication Sig  . albuterol (PROVENTIL) (2.5 MG/3ML) 0.083% nebulizer solution Take 3 mLs (2.5 mg total) by nebulization every 2 (two) hours as needed for wheezing or shortness of breath.  . ALPRAZolam (XANAX) 0.5 MG tablet Take 0.5 mg by mouth 2 (two) times daily as needed for anxiety.  Marland Kitchen aspirin (ASPIRIN EC) 81 MG EC tablet Take 81 mg by mouth daily. Swallow whole.  . budesonide  (PULMICORT) 0.5 MG/2ML nebulizer solution Take 2 mLs (0.5 mg total) by nebulization 2 (two) times daily.  Marland Kitchen diltiazem (TIAZAC) 300 MG 24 hr capsule Take 1 capsule (300 mg total) by mouth daily.  . flecainide (TAMBOCOR) 100 MG tablet Take 1 tablet (100 mg total) by mouth 2 (two) times daily.  . formoterol (PERFOROMIST) 20 MCG/2ML nebulizer solution Take 2 mLs (20 mcg total) by nebulization 2 (two) times daily.  . furosemide (LASIX) 20 MG tablet Take 20 mg by mouth daily.  . hydrochlorothiazide (HYDRODIURIL) 25 MG tablet Take 25 mg by mouth daily.  Marland Kitchen HYDROcodone-acetaminophen (NORCO/VICODIN) 5-325 MG per tablet Take 1 tablet by mouth every 6 (six) hours as needed for moderate pain.  . hydrOXYzine (ATARAX/VISTARIL) 50 MG tablet Take 1 tablet (50 mg total) by mouth 3 (three) times daily as needed for anxiety.  Marland Kitchen ipratropium-albuterol (DUONEB) 0.5-2.5 (3) MG/3ML SOLN Take 3 mLs by nebulization every 4 (four) hours.  Marland Kitchen levofloxacin (LEVAQUIN) 500 MG tablet Take 1 tablet (500 mg total) by mouth daily.  Marland Kitchen loratadine (CLARITIN) 10 MG tablet Take 10 mg by mouth daily.  Marland Kitchen omeprazole (PRILOSEC) 20 MG capsule Take 20 mg by mouth daily.  . potassium chloride (K-DUR,KLOR-CON) 10 MEQ tablet Take 20 mEq by mouth 2 (two) times daily.  . Rivaroxaban (XARELTO) 20 MG TABS tablet Take 1 tablet (20 mg total) by mouth daily with supper.    Review of Systems  Constitutional: Negative.   HENT: Negative.   Eyes: Negative.   Respiratory: Positive for cough and shortness of breath.   Cardiovascular: Negative.  Gastrointestinal: Negative.   Endocrine: Negative.   Musculoskeletal: Negative.   Skin: Negative.   Allergic/Immunologic: Negative.   Neurological: Negative.   Hematological: Negative.   Psychiatric/Behavioral: Negative.   All other systems reviewed and are negative.    BP 110/64  Pulse 72  Ht 5\' 1"  (1.549 m)  Wt 178 lb 8 oz (80.967 kg)  BMI 33.74 kg/m2  Physical Exam  Nursing note and vitals  reviewed. Constitutional: She is oriented to person, place, and time. She appears well-developed and well-nourished.  HENT:  Head: Normocephalic.  Nose: Nose normal.  Mouth/Throat: Oropharynx is clear and moist.  Eyes: Conjunctivae are normal. Pupils are equal, round, and reactive to light.  Neck: Normal range of motion. Neck supple. No JVD present.  Cardiovascular: Normal rate, regular rhythm, S1 normal, S2 normal, normal heart sounds and intact distal pulses.  Exam reveals no gallop and no friction rub.   No murmur heard. Pulmonary/Chest: Effort normal and breath sounds normal. No respiratory distress. She has no wheezes. She has no rales. She exhibits no tenderness.  Abdominal: Soft. Bowel sounds are normal. She exhibits no distension. There is no tenderness.  Musculoskeletal: Normal range of motion. She exhibits no edema and no tenderness.  Lymphadenopathy:    She has no cervical adenopathy.  Neurological: She is alert and oriented to person, place, and time. Coordination normal.  Skin: Skin is warm and dry. No rash noted. No erythema.  Psychiatric: She has a normal mood and affect. Her behavior is normal. Judgment and thought content normal.    Assessment and Plan        a

## 2013-12-09 ENCOUNTER — Emergency Department: Payer: Self-pay | Admitting: Emergency Medicine

## 2013-12-09 LAB — URINALYSIS, COMPLETE
BACTERIA: NONE SEEN
Bilirubin,UR: NEGATIVE
Blood: NEGATIVE
Glucose,UR: NEGATIVE mg/dL (ref 0–75)
Ketone: NEGATIVE
Nitrite: NEGATIVE
Ph: 7 (ref 4.5–8.0)
Protein: NEGATIVE
RBC,UR: 1 /HPF (ref 0–5)
Specific Gravity: 1.008 (ref 1.003–1.030)
Squamous Epithelial: 1
Transitional Epi: <1

## 2013-12-09 LAB — COMPREHENSIVE METABOLIC PANEL
ALK PHOS: 45 U/L
Albumin: 2.8 g/dL — ABNORMAL LOW (ref 3.4–5.0)
Anion Gap: 5 — ABNORMAL LOW (ref 7–16)
BUN: 17 mg/dL (ref 7–18)
Bilirubin,Total: 0.4 mg/dL (ref 0.2–1.0)
CHLORIDE: 94 mmol/L — AB (ref 98–107)
CO2: 34 mmol/L — AB (ref 21–32)
Calcium, Total: 8.9 mg/dL (ref 8.5–10.1)
Creatinine: 1.07 mg/dL (ref 0.60–1.30)
GFR CALC AF AMER: 58 — AB
GFR CALC NON AF AMER: 50 — AB
Glucose: 89 mg/dL (ref 65–99)
Osmolality: 267 (ref 275–301)
Potassium: 2.9 mmol/L — ABNORMAL LOW (ref 3.5–5.1)
SGOT(AST): 14 U/L — ABNORMAL LOW (ref 15–37)
SGPT (ALT): 19 U/L (ref 12–78)
Sodium: 133 mmol/L — ABNORMAL LOW (ref 136–145)
TOTAL PROTEIN: 6.7 g/dL (ref 6.4–8.2)

## 2013-12-09 LAB — CBC
HCT: 32.1 % — ABNORMAL LOW (ref 35.0–47.0)
HGB: 10.5 g/dL — ABNORMAL LOW (ref 12.0–16.0)
MCH: 29.2 pg (ref 26.0–34.0)
MCHC: 32.6 g/dL (ref 32.0–36.0)
MCV: 90 fL (ref 80–100)
Platelet: 213 10*3/uL (ref 150–440)
RBC: 3.58 10*6/uL — ABNORMAL LOW (ref 3.80–5.20)
RDW: 15.6 % — ABNORMAL HIGH (ref 11.5–14.5)
WBC: 6.3 10*3/uL (ref 3.6–11.0)

## 2013-12-09 LAB — LIPASE, BLOOD: Lipase: 65 U/L — ABNORMAL LOW (ref 73–393)

## 2013-12-10 ENCOUNTER — Emergency Department: Payer: Self-pay | Admitting: Emergency Medicine

## 2013-12-10 ENCOUNTER — Encounter: Payer: Self-pay | Admitting: Pulmonary Disease

## 2013-12-10 ENCOUNTER — Ambulatory Visit (INDEPENDENT_AMBULATORY_CARE_PROVIDER_SITE_OTHER): Payer: Medicare Other | Admitting: Pulmonary Disease

## 2013-12-10 ENCOUNTER — Telehealth: Payer: Self-pay

## 2013-12-10 VITALS — BP 118/72 | HR 77 | Ht 61.5 in | Wt 181.0 lb

## 2013-12-10 DIAGNOSIS — J449 Chronic obstructive pulmonary disease, unspecified: Secondary | ICD-10-CM

## 2013-12-10 DIAGNOSIS — J9600 Acute respiratory failure, unspecified whether with hypoxia or hypercapnia: Secondary | ICD-10-CM

## 2013-12-10 DIAGNOSIS — R911 Solitary pulmonary nodule: Secondary | ICD-10-CM

## 2013-12-10 MED ORDER — PREDNISONE 10 MG PO TABS: ORAL_TABLET | ORAL | Status: DC

## 2013-12-10 MED ORDER — HYDROCOD POLST-CHLORPHEN POLST 10-8 MG/5ML PO LQCR: 5.0000 mL | Freq: Every evening | ORAL | Status: DC | PRN

## 2013-12-10 MED ORDER — TIOTROPIUM BROMIDE MONOHYDRATE 18 MCG IN CAPS: 18.0000 ug | ORAL_CAPSULE | Freq: Every day | RESPIRATORY_TRACT | Status: DC

## 2013-12-10 NOTE — Telephone Encounter (Signed)
[facility], pt is scheduled for CT chest w/o contrast on 12/15/13 @11 .  Spoke with pt's husband, they are aware of this and where to go to have this done.  Nothing needed, just an fyi.

## 2013-12-10 NOTE — Assessment & Plan Note (Signed)
Toni [name] is still struggling from an exacerbation of COPD. I agree with my colleague Dr. Melvyn Novas that she has severe COPD. I need to see results of a pulmonary function tests in order to quantify this.  Plan: -Prednisone taper -Start Spiriva -Continue Pulmicort and Perforomist - Referral for pulmonary rehabilitation -Obtain records from Dr. Gust Brooms office

## 2013-12-10 NOTE — Patient Instructions (Signed)
Take the prednisone taper as written Take the tussionex at night as needed for cough Take the spiriva daily no matter how you feel Continue taking your nebulizers as you are doing We will refer you to pulmonary rehab at [facility] We will see you back in 4-6 weeks or sooner if needed

## 2013-12-10 NOTE — Assessment & Plan Note (Signed)
Given her smoking history the 2 nodules seen on the December 0034 CT chest are certainly worrisome for malignancy. The PET scan performed in December 2014 is somewhat reassuring but we still need to watch these carefully.  Plan: -Repeat CT chest this week

## 2013-12-10 NOTE — Progress Notes (Signed)
Subjective:    Patient ID: Toni [name], female    DOB: 1934/12/18, 79 y.o.   MRN: [id number]  Synopsis: Toni [name] has COPD and was previously followed by Dr. Raul Del area she was hospitalized at [facility] for a COPD exacerbation and A. fib with RVR in February 2015. She personally [employer] pulmonary clinic in March of 2015. In December 2015 she was found to have a cavitary lung nodule in the right upper lobe which was 1.5 cm in size as well as a 9 mm spiculated nodule in the right lower lobe.  HPI  Toni [name] is here to followup with me today regarding her COPD. Since being discharged from the hospital she continues to have shortness of breath and chest congestion. Her cough has been bothering her quite a bit but she rarely produces sputum. She has been wheezing quite a bit. She has been using albuterol nebulizers 3-4 times per day. She is using her Perforomist and Pulmicort nebulizers regularly. She has not had fevers chills or weight loss. She has more leg swelling today and so she has taken an extra dose of Lasix. She continues to take the flecainide and Cardizem and her heart rate has been consistently under 100 beats per minute.    Past Medical History  Diagnosis Date  . Cataract   . COPD (chronic obstructive pulmonary disease)   . Essential hypertension, benign   . Hypothyroidism   . Atypical atrial flutter 11/16/2013  . Arrhythmia   . A-fib   . Chronic diastolic heart failure   . Hypokalemia   . Lung nodule     Followed by Dr. Raul Del  . 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 CAD Father   . Heart disease Father   . Heart attack Father   . Arrhythmia Sister      History   Social History  . Marital Status: Married    Spouse Name: N/A    Number of Children: N/A  . Years of Education: N/A   Occupational History  . Not on file.   Social History Main Topics  . Smoking status: Former  Smoker -- 1.50 packs/day for 50 years    Types: Cigarettes    Quit date: 09/18/2002  . Smokeless tobacco: Never Used  . Alcohol Use: No  . Drug Use: No  . Sexual Activity: Not on file   Other Topics Concern  . Not on file   Social History Narrative  . No narrative on file     Allergies  Allergen Reactions  . Belladonna Alkaloids     "made eyes cross"      Outpatient Prescriptions Prior to Visit  Medication Sig Dispense Refill  . aspirin (ASPIRIN EC) 81 MG EC tablet Take 81 mg by mouth daily. Swallow whole.      . budesonide (PULMICORT) 0.5 MG/2ML nebulizer solution Take 2 mLs (0.5 mg total) by nebulization 2 (two) times daily.  120 mL  11  . diltiazem (TIAZAC) 300 MG 24 hr capsule Take 1 capsule (300 mg total) by mouth daily.  30 capsule  11  . flecainide (TAMBOCOR) 100 MG tablet Take 1 tablet (100 mg total) by mouth 2 (two) times daily.  60 tablet  11  . formoterol (PERFOROMIST) 20 MCG/2ML nebulizer solution Take 2 mLs (20 mcg total) by nebulization 2 (two) times daily.  120 mL  11  . furosemide (  LASIX) 20 MG tablet Take 20 mg by mouth daily.      . hydrochlorothiazide (HYDRODIURIL) 25 MG tablet Take 25 mg by mouth daily.      Marland Kitchen HYDROcodone-acetaminophen (NORCO/VICODIN) 5-325 MG per tablet Take 1 tablet by mouth every 6 (six) hours as needed for moderate pain.      . hydrOXYzine (ATARAX/VISTARIL) 50 MG tablet Take 1 tablet (50 mg total) by mouth 3 (three) times daily as needed for anxiety.  90 tablet  0  . ipratropium-albuterol (DUONEB) 0.5-2.5 (3) MG/3ML SOLN Take 3 mLs by nebulization every 4 (four) hours.  360 mL  0  . loratadine (CLARITIN) 10 MG tablet Take 10 mg by mouth daily.      Marland Kitchen omeprazole (PRILOSEC) 20 MG capsule Take 20 mg by mouth daily.      . potassium chloride (K-DUR,KLOR-CON) 10 MEQ tablet Take 20 mEq by mouth 2 (two) times daily.      . Rivaroxaban (XARELTO) 20 MG TABS tablet Take 1 tablet (20 mg total) by mouth daily with supper.  30 tablet  0  . ALPRAZolam  (XANAX) 0.5 MG tablet Take 0.5 mg by mouth 2 (two) times daily as needed for anxiety.      Marland Kitchen albuterol (PROVENTIL) (2.5 MG/3ML) 0.083% nebulizer solution Take 3 mLs (2.5 mg total) by nebulization every 2 (two) hours as needed for wheezing or shortness of breath.  75 mL  11  . levofloxacin (LEVAQUIN) 500 MG tablet Take 1 tablet (500 mg total) by mouth daily.  2 tablet  0   No facility-administered medications prior to visit.      Review of Systems  Constitutional: Positive for fatigue. Negative for fever and chills.  HENT: Negative for postnasal drip, rhinorrhea and sinus pressure.   Respiratory: Positive for cough, shortness of breath and wheezing.   Cardiovascular: Positive for leg swelling. Negative for chest pain and palpitations.       Objective:   Physical Exam Filed Vitals:   12/10/13 1203  BP: 118/72  Pulse: 77   Gen: chronically ill appearing, no acute distress HEENT: NCAT, PERRL, EOMi, OP clear, neck supple without masses PULM: Wheezing bilaterally, no crackles CV: Irreg irreg, no mgr, no JVD AB: BS+, soft, nontender, no hsm Ext: warm, notable pretibial edema, no clubbing, no cyanosis Derm: no rash or skin breakdown Neuro: A&Ox4, CN II-XII intact,MAEW  09/15/2013 CT chest> 1.5 cm cavitary lung nodule right upper lobe, there is also a 9 mm spiculated nodule in the RLL 09/17/2013 PET/CT> the 1.5 cm nodule is only mildly FDG avid      Assessment & Plan:   COPD (chronic obstructive pulmonary disease) Toni [name] is still struggling from an exacerbation of COPD. I agree with my colleague Dr. Melvyn Novas that she has severe COPD. I need to see results of a pulmonary function tests in order to quantify this.  Plan: -Prednisone taper -Start Spiriva -Continue Pulmicort and Perforomist - Referral for pulmonary rehabilitation -Obtain records from Dr. Gust Brooms office  Lung nodule Given her smoking history the 2 nodules seen on the December 9485 CT chest are certainly  worrisome for malignancy. The PET scan performed in December 2014 is somewhat reassuring but we still need to watch these carefully.  Plan: -Repeat CT chest this week   Updated Medication List Outpatient Encounter Prescriptions as of 12/10/2013  Medication Sig  . albuterol (PROVENTIL HFA;VENTOLIN HFA) 108 (90 BASE) MCG/ACT inhaler Inhale 1-2 puffs into the lungs every 6 (six) hours as needed for wheezing  or shortness of breath.  Marland Kitchen aspirin (ASPIRIN EC) 81 MG EC tablet Take 81 mg by mouth daily. Swallow whole.  . budesonide (PULMICORT) 0.5 MG/2ML nebulizer solution Take 2 mLs (0.5 mg total) by nebulization 2 (two) times daily.  Marland Kitchen diltiazem (TIAZAC) 300 MG 24 hr capsule Take 1 capsule (300 mg total) by mouth daily.  . flecainide (TAMBOCOR) 100 MG tablet Take 1 tablet (100 mg total) by mouth 2 (two) times daily.  . formoterol (PERFOROMIST) 20 MCG/2ML nebulizer solution Take 2 mLs (20 mcg total) by nebulization 2 (two) times daily.  . furosemide (LASIX) 20 MG tablet Take 20 mg by mouth daily.  . hydrochlorothiazide (HYDRODIURIL) 25 MG tablet Take 25 mg by mouth daily.  Marland Kitchen HYDROcodone-acetaminophen (NORCO/VICODIN) 5-325 MG per tablet Take 1 tablet by mouth every 6 (six) hours as needed for moderate pain.  . hydrOXYzine (ATARAX/VISTARIL) 50 MG tablet Take 1 tablet (50 mg total) by mouth 3 (three) times daily as needed for anxiety.  Marland Kitchen ipratropium-albuterol (DUONEB) 0.5-2.5 (3) MG/3ML SOLN Take 3 mLs by nebulization every 4 (four) hours.  Marland Kitchen loratadine (CLARITIN) 10 MG tablet Take 10 mg by mouth daily.  Marland Kitchen omeprazole (PRILOSEC) 20 MG capsule Take 20 mg by mouth daily.  . potassium chloride (K-DUR,KLOR-CON) 10 MEQ tablet Take 20 mEq by mouth 2 (two) times daily.  . Rivaroxaban (XARELTO) 20 MG TABS tablet Take 1 tablet (20 mg total) by mouth daily with supper.  . ALPRAZolam (XANAX) 0.5 MG tablet Take 0.5 mg by mouth 2 (two) times daily as needed for anxiety.  . chlorpheniramine-HYDROcodone (TUSSIONEX  PENNKINETIC ER) 10-8 MG/5ML LQCR Take 5 mLs by mouth at bedtime as needed for cough.  . predniSONE (DELTASONE) 10 MG tablet Take 40m po daily for 3 days, then take 331mpo daily for 3 days, then take 2079mo daily for two days, then take 35m48m daily for 2 days  . tiotropium (SPIRIVA HANDIHALER) 18 MCG inhalation capsule Place 1 capsule (18 mcg total) into inhaler and inhale daily.  . [DISCONTINUED] albuterol (PROVENTIL) (2.5 MG/3ML) 0.083% nebulizer solution Take 3 mLs (2.5 mg total) by nebulization every 2 (two) hours as needed for wheezing or shortness of breath.  . [DISCONTINUED] levofloxacin (LEVAQUIN) 500 MG tablet Take 1 tablet (500 mg total) by mouth daily.

## 2013-12-15 ENCOUNTER — Ambulatory Visit: Payer: Self-pay | Admitting: Specialist

## 2013-12-18 ENCOUNTER — Telehealth: Payer: Self-pay | Admitting: Pulmonary Disease

## 2013-12-18 NOTE — Telephone Encounter (Signed)
Spoke with pt. Dr. Vella Kohler did a CT scan on her recently. She is wanting the results of that. Advised her that she would need to contact Dr. Joanell Rising office for those results. She agreed and verbalized understanding.

## 2014-01-01 ENCOUNTER — Encounter: Payer: Self-pay | Admitting: Pulmonary Disease

## 2014-01-01 ENCOUNTER — Telehealth: Payer: Self-pay | Admitting: Pulmonary Disease

## 2014-01-01 DIAGNOSIS — J449 Chronic obstructive pulmonary disease, unspecified: Secondary | ICD-10-CM

## 2014-01-01 NOTE — Telephone Encounter (Signed)
Called spoke with Bruce. He reports pt had CT scan 12/15/13 over at [facility]. He wants to know if BQ has reviewed this and his thoughts. Please advise thanks

## 2014-01-01 NOTE — Telephone Encounter (Signed)
I have not seen these results.  Please try to track them down  Thanks

## 2014-01-01 NOTE — Telephone Encounter (Signed)
Will re-fax records request to [facility].

## 2014-01-05 NOTE — Telephone Encounter (Signed)
These results have been received from [facility], they are in BQ's look-at folder.  Will forward message to him so he is aware.

## 2014-01-09 ENCOUNTER — Encounter: Payer: Self-pay | Admitting: Pulmonary Disease

## 2014-01-09 NOTE — Telephone Encounter (Signed)
Please let her know that the nodules have not changed which is good She needs to provide [place] three sputum specimens for AFB culture because sometimes atypical infections can look like this Can we arrange that and then have her see me in 6-8 weeks if not previously scheduled?

## 2014-01-09 NOTE — Telephone Encounter (Signed)
Called and spoke with pt and she is aware of Results per BQ.  Pt is aware that she will need to collect the 3 sputum samples for the AFB culture. Pt voiced her understanding and nothing further is needed.

## 2014-01-19 ENCOUNTER — Ambulatory Visit (INDEPENDENT_AMBULATORY_CARE_PROVIDER_SITE_OTHER): Payer: Medicare Other | Admitting: Pulmonary Disease

## 2014-01-19 ENCOUNTER — Encounter: Payer: Self-pay | Admitting: Pulmonary Disease

## 2014-01-19 VITALS — BP 134/72 | HR 72 | Ht 61.5 in | Wt 181.0 lb

## 2014-01-19 DIAGNOSIS — J449 Chronic obstructive pulmonary disease, unspecified: Secondary | ICD-10-CM

## 2014-01-19 DIAGNOSIS — J4489 Other specified chronic obstructive pulmonary disease: Secondary | ICD-10-CM

## 2014-01-19 DIAGNOSIS — R911 Solitary pulmonary nodule: Secondary | ICD-10-CM

## 2014-01-19 NOTE — Assessment & Plan Note (Addendum)
I'm happy that the nodule did not grow on the most recent CT.  Given all the ground tree-in-bud abnormalities I worry about MAI or another atypical mycobacterium.  Her sputum cultures are pending right now.  Because of her smoking history and the size of the lesion I will refer her to Dr. Genevive Bi to see if he thinks this lesion needs to be biopsied (wedge or navigational bronch).  If he feels it would be more appropriate to watch it with serial imaging, then I will proceed with a bronchoscopy for AFB cultures if the sputum growth is negative.  Plan: -refer to Dr. Genevive Bi -see details above

## 2014-01-19 NOTE — Patient Instructions (Signed)
We will have Dr. Genevive Bi see you for the pulmonary nodule, Please call us after you have seen him If Dr. Genevive Bi does not want to perform a biopsy then I will arrange a bronchoscopy for you Keep taking your medicines as prescribed Let us know if the spray form of the Spiriva works better and we will prescribe it for you.

## 2014-01-19 NOTE — Progress Notes (Signed)
Subjective:    Patient ID: Toni [name], female    DOB: [birth year], 79 y.o.   MRN: [id number]  Synopsis: GODL Grade D COPD and 1.5cm partially cavitary pulmonary nodule seen first in 08/2013 on CT angio chest.  HPI  01/19/2014 ROV> Since her last visit her breathing has not really changed much. She does say that she feels a little worse in the mornings a lot of chest congestion. She feels that her breathing changes depending on the weather. Some days are good some days are bad. She really can't tell much of a difference by using Spiriva. Has been using it regularly. She has not lost weight or been coughing up blood since the last visit. She really can't produce much mucus. She's had a dry hacking cough. She's not had fevers or chills. She is frustrated by her lack of progress. She is very, very nervous about the possibility of lung cancer.   Past Medical History  Diagnosis Date  . Cataract   . COPD (chronic obstructive pulmonary disease)   . Essential hypertension, benign   . Hypothyroidism   . Atypical atrial flutter 11/16/2013  . Arrhythmia   . A-fib   . Chronic diastolic heart failure   . Hypokalemia   . Lung nodule     Followed by Dr. Raul Del  . Kidney disease, chronic, stage II (mild, EGFR 60+ ml/min)      Review of Systems     Objective:   Physical Exam Filed Vitals:   01/19/14 1339  BP: 134/72  Pulse: 72  Height: 5' 1.5" (1.562 m)  Weight: 181 lb (82.101 kg)  SpO2: 97%    RA  Gen: well appearing, no acute distress HEENT: NCAT, EOMi, OP clear PULM: crackles in bases, no wheezing, limited air movement CV: RRR, no mgr, no JVD AB: BS+, soft, nontender, no hsm Ext: warm, trace edema, no clubbing, no cyanosis Derm: no rash or skin breakdown Neuro: A&Ox4, MAEW  09/2013 PFT> Ratio 63%, FEV1 0.97L (52% pred, 6% change), TLC 6.61L (155% pred), RV 4.87L (273% pred), DLCO 47% pred   09/15/2013 CT chest> 1.5 cm cavitary lung nodule right upper lobe, there is also a 9 mm  spiculated nodule in the RLL 09/17/2013 PET/CT> the 1.5 cm nodule is only mildly FDG avid 12/15/2013 CT chest > scattered ggo and tree-in-bud abnormalities; nodules unchanged; cavitary nodular lesion is also grossly unchanged though nodular component is slightly more solid than last time     Assessment & Plan:   Lung nodule I'm happy that the nodule did not grow on the most recent CT.  Given all the ground tree-in-bud abnormalities I worry about MAI or another atypical mycobacterium.  Her sputum cultures are pending right now.  Because of her smoking history and the size of the lesion I will refer her to Dr. Genevive Bi to see if he thinks this lesion needs to be biopsied (wedge or navigational bronch).  If he feels it would be more appropriate to watch it with serial imaging, then I will proceed with a bronchoscopy for AFB cultures if the sputum growth is negative.  Plan: -refer to Dr. Genevive Bi -see details above  COPD (chronic obstructive pulmonary disease) Continue Spiriva, pulmicort and brovana    Updated Medication List Outpatient Encounter Prescriptions as of 01/19/2014  Medication Sig  . albuterol (PROVENTIL HFA;VENTOLIN HFA) 108 (90 BASE) MCG/ACT inhaler Inhale 1-2 puffs into the lungs every 6 (six) hours as needed for wheezing or shortness of breath.  Marland Kitchen aspirin (  ASPIRIN EC) 81 MG EC tablet Take 81 mg by mouth daily. Swallow whole.  . budesonide (PULMICORT) 0.5 MG/2ML nebulizer solution Take 2 mLs (0.5 mg total) by nebulization 2 (two) times daily.  Marland Kitchen diltiazem (TIAZAC) 300 MG 24 hr capsule Take 1 capsule (300 mg total) by mouth daily.  . flecainide (TAMBOCOR) 100 MG tablet Take 1 tablet (100 mg total) by mouth 2 (two) times daily.  . formoterol (PERFOROMIST) 20 MCG/2ML nebulizer solution Take 2 mLs (20 mcg total) by nebulization 2 (two) times daily.  . furosemide (LASIX) 20 MG tablet Take 20 mg by mouth daily.  Marland Kitchen HYDROcodone-acetaminophen (NORCO/VICODIN) 5-325 MG per tablet Take 1 tablet by  mouth every 6 (six) hours as needed for moderate pain.  Marland Kitchen ipratropium-albuterol (DUONEB) 0.5-2.5 (3) MG/3ML SOLN Take 3 mLs by nebulization every 4 (four) hours.  Marland Kitchen loratadine (CLARITIN) 10 MG tablet Take 10 mg by mouth daily.  Marland Kitchen omeprazole (PRILOSEC) 20 MG capsule Take 20 mg by mouth daily.  . potassium chloride (K-DUR,KLOR-CON) 10 MEQ tablet Take 30 mEq by mouth daily.   . Rivaroxaban (XARELTO) 20 MG TABS tablet Take 1 tablet (20 mg total) by mouth daily with supper.  . tiotropium (SPIRIVA HANDIHALER) 18 MCG inhalation capsule Place 1 capsule (18 mcg total) into inhaler and inhale daily.  . hydrochlorothiazide (HYDRODIURIL) 25 MG tablet Take 25 mg by mouth daily.  . [DISCONTINUED] ALPRAZolam (XANAX) 0.5 MG tablet Take 0.5 mg by mouth 2 (two) times daily as needed for anxiety.  . [DISCONTINUED] chlorpheniramine-HYDROcodone (TUSSIONEX PENNKINETIC ER) 10-8 MG/5ML LQCR Take 5 mLs by mouth at bedtime as needed for cough.  . [DISCONTINUED] hydrOXYzine (ATARAX/VISTARIL) 50 MG tablet Take 1 tablet (50 mg total) by mouth 3 (three) times daily as needed for anxiety.  . [DISCONTINUED] predniSONE (DELTASONE) 10 MG tablet Take 44m po daily for 3 days, then take 344mpo daily for 3 days, then take 2023mo daily for two days, then take 68m75m daily for 2 days

## 2014-01-19 NOTE — Assessment & Plan Note (Signed)
Continue Spiriva, pulmicort and brovana

## 2014-01-20 ENCOUNTER — Other Ambulatory Visit: Payer: Self-pay | Admitting: *Deleted

## 2014-01-20 ENCOUNTER — Telehealth: Payer: Self-pay | Admitting: Pulmonary Disease

## 2014-01-20 ENCOUNTER — Encounter: Payer: Self-pay | Admitting: Pulmonary Disease

## 2014-01-20 DIAGNOSIS — J449 Chronic obstructive pulmonary disease, unspecified: Secondary | ICD-10-CM

## 2014-01-22 NOTE — Telephone Encounter (Signed)
Order has been sent in for new neb machine.  Called and spoke with pt and she is aware.

## 2014-01-29 ENCOUNTER — Ambulatory Visit: Payer: Self-pay | Admitting: Cardiothoracic Surgery

## 2014-02-20 ENCOUNTER — Ambulatory Visit: Payer: Medicare Other | Admitting: Pulmonary Disease

## 2014-03-04 ENCOUNTER — Encounter: Payer: Self-pay | Admitting: Pulmonary Disease

## 2014-03-04 LAB — AFB CULTURE WITH SMEAR (NOT AT ARMC)
Acid Fast Smear: NONE SEEN
Acid Fast Smear: NONE SEEN
Acid Fast Smear: NONE SEEN

## 2014-03-05 ENCOUNTER — Encounter: Payer: Self-pay | Admitting: Pulmonary Disease

## 2014-03-05 ENCOUNTER — Ambulatory Visit (INDEPENDENT_AMBULATORY_CARE_PROVIDER_SITE_OTHER): Payer: Medicare Other | Admitting: Pulmonary Disease

## 2014-03-05 VITALS — BP 124/66 | HR 68 | Ht 61.5 in | Wt 182.0 lb

## 2014-03-05 DIAGNOSIS — R0982 Postnasal drip: Secondary | ICD-10-CM

## 2014-03-05 DIAGNOSIS — J42 Unspecified chronic bronchitis: Secondary | ICD-10-CM

## 2014-03-05 DIAGNOSIS — R911 Solitary pulmonary nodule: Secondary | ICD-10-CM

## 2014-03-05 MED ORDER — LORATADINE 10 MG PO TABS: 10.0000 mg | ORAL_TABLET | Freq: Every day | ORAL | Status: DC

## 2014-03-05 MED ORDER — IPRATROPIUM-ALBUTEROL 0.5-2.5 (3) MG/3ML IN SOLN: 3.0000 mL | RESPIRATORY_TRACT | Status: DC

## 2014-03-05 MED ORDER — BENZONATATE 100 MG PO CAPS: 100.0000 mg | ORAL_CAPSULE | Freq: Two times a day (BID) | ORAL | Status: DC | PRN

## 2014-03-05 NOTE — Assessment & Plan Note (Signed)
This is contributing significantly to her cough. I advised that she use an antihistamine/decongestant combination pill, saline rinses, and nasal steroids. We'll also give her some Tessalon Perles to help with the cough.

## 2014-03-05 NOTE — Patient Instructions (Signed)
We will arrange another CT scan of your chest For the cough: take tessalon two times a day as needed, take chlorpheniramine before bedtime and use saline rinses before bed (I prefer Milta Deiters Med rinses); don't take claritin on days you take the chlorpheniramine We will call you with the results of your CT

## 2014-03-05 NOTE — Progress Notes (Signed)
Subjective:    Patient ID: Toni [name], female    DOB: Feb 10, 1935, 79 y.o.   MRN: [id number]  Synopsis: GOLD Grade D COPD and 1.5cm partially cavitary pulmonary nodule seen first in 08/2013 on CT angio chest.  HPI  03/05/2014 ROV > since the last visit Nelle has continued to have cough and some shortness of breath. She continues to take her medications as documented below. Her cough is rarely producing sputum. She does not have fevers or weight loss but she has had some chills. She has not had hemoptysis. She has had some nosebleeds on the Xarelto. She continues to take Xarelto daily. She went to go see thoracic surgery and they told her that I thought she would be too high risk for an open lung biopsy. She tells me that she's worried she may have lung cancer. She has not been smoking cigarettes in the last visit.   Past Medical History  Diagnosis Date  . Cataract   . COPD (chronic obstructive pulmonary disease)   . Essential hypertension, benign   . Hypothyroidism   . Atypical atrial flutter 11/16/2013  . Arrhythmia   . A-fib   . Chronic diastolic heart failure   . Hypokalemia   . Lung nodule     Followed by Dr. Raul Del  . Kidney disease, chronic, stage II (mild, EGFR 60+ ml/min)      Review of Systems  Constitutional: Positive for fatigue. Negative for fever and chills.  HENT: Positive for postnasal drip and rhinorrhea. Negative for nosebleeds.   Respiratory: Positive for cough and shortness of breath. Negative for choking.   Cardiovascular: Negative for chest pain, palpitations and leg swelling.       Objective:   Physical Exam  Filed Vitals:   03/05/14 1348  BP: 124/66  Pulse: 68  Height: 5' 1.5" (1.562 m)  Weight: 182 lb (82.555 kg)  SpO2: 96%    RA  Gen: chronically ill appearing, no acute distress HEENT: NCAT, EOMi, OP clear PULM: crackles in bases, no wheezing today CV: RRR, no mgr, no JVD AB: BS+, soft, nontender, no hsm Ext: warm, trace edema, no  clubbing, no cyanosis Derm: no rash or skin breakdown  09/2013 PFT> Ratio 63%, FEV1 0.97L (52% pred, 6% change), TLC 6.61L (155% pred), RV 4.87L (273% pred), DLCO 47% pred   09/15/2013 CT chest> 1.5 cm cavitary lung nodule right upper lobe, there is also a 9 mm spiculated nodule in the RLL 09/17/2013 PET/CT> the 1.5 cm nodule is only mildly FDG avid 12/15/2013 CT chest > scattered ggo and tree-in-bud abnormalities; nodules unchanged; cavitary nodular lesion is also grossly unchanged though nodular component is slightly more solid than last time     Assessment & Plan:   Lung nodule Thoracic surgery felt that the risk of an open lung biopsy would be too high. I have to agree with them as she does have very severe COPD. This nodule does not appear to be amenable to bronchoscopy. Her risk for malignancy is quite high giving her smoking history but the nodule is slow growing, solid, and smooth which would suggest a benign process.  Her sputum AFBs were negative x3. I think there is little role for bronchoscopy to try to further diagnose mycobacterial disease.  We await the conversation today about various ways to approach this. Ultimately we decided that the best thing to be to perform a CT scan to see if there has been interval change in the last 4 months since the  last study.  Plan: -Repeat CT chest now -If the nodule has grown then we will set up a needle biopsy (will need to hold Xarelto)  COPD (chronic obstructive pulmonary disease) Gold grade D. based on frequent exacerbations.  Plan: -Continue Pulmicort, Spiriva, and albuterol. -Add Tessalon Perles for cough  Postnasal drip This is contributing significantly to her cough. I advised that she use an antihistamine/decongestant combination pill, saline rinses, and nasal steroids. We'll also give her some Tessalon Perles to help with the cough.    Updated Medication List Outpatient Encounter Prescriptions as of 03/05/2014  Medication  Sig  . albuterol (PROVENTIL HFA;VENTOLIN HFA) 108 (90 BASE) MCG/ACT inhaler Inhale 1-2 puffs into the lungs every 6 (six) hours as needed for wheezing or shortness of breath.  . budesonide (PULMICORT) 0.5 MG/2ML nebulizer solution Take 2 mLs (0.5 mg total) by nebulization 2 (two) times daily.  Marland Kitchen diltiazem (TIAZAC) 300 MG 24 hr capsule Take 1 capsule (300 mg total) by mouth daily.  . flecainide (TAMBOCOR) 100 MG tablet Take 1 tablet (100 mg total) by mouth 2 (two) times daily.  . formoterol (PERFOROMIST) 20 MCG/2ML nebulizer solution Take 2 mLs (20 mcg total) by nebulization 2 (two) times daily.  . furosemide (LASIX) 20 MG tablet Take 20 mg by mouth daily.  Marland Kitchen HYDROcodone-acetaminophen (NORCO/VICODIN) 5-325 MG per tablet Take 1 tablet by mouth every 6 (six) hours as needed for moderate pain.  Marland Kitchen ipratropium-albuterol (DUONEB) 0.5-2.5 (3) MG/3ML SOLN Take 3 mLs by nebulization every 4 (four) hours.  Marland Kitchen levothyroxine (SYNTHROID, LEVOTHROID) 75 MCG tablet Take 75 mcg by mouth daily before breakfast.  . loratadine (CLARITIN) 10 MG tablet Take 10 mg by mouth daily.  Marland Kitchen omeprazole (PRILOSEC) 20 MG capsule Take 20 mg by mouth daily.  . potassium chloride (K-DUR,KLOR-CON) 10 MEQ tablet Take 30 mEq by mouth daily.   . Rivaroxaban (XARELTO) 20 MG TABS tablet Take 1 tablet (20 mg total) by mouth daily with supper.  . tiotropium (SPIRIVA HANDIHALER) 18 MCG inhalation capsule Place 1 capsule (18 mcg total) into inhaler and inhale daily.  . [DISCONTINUED] aspirin (ASPIRIN EC) 81 MG EC tablet Take 81 mg by mouth daily. Swallow whole.  . [DISCONTINUED] hydrochlorothiazide (HYDRODIURIL) 25 MG tablet Take 25 mg by mouth daily.

## 2014-03-05 NOTE — Assessment & Plan Note (Signed)
Thoracic surgery felt that the risk of an open lung biopsy would be too high. I have to agree with them as she does have very severe COPD. This nodule does not appear to be amenable to bronchoscopy. Her risk for malignancy is quite high giving her smoking history but the nodule is slow growing, solid, and smooth which would suggest a benign process.  Her sputum AFBs were negative x3. I think there is little role for bronchoscopy to try to further diagnose mycobacterial disease.  We await the conversation today about various ways to approach this. Ultimately we decided that the best thing to be to perform a CT scan to see if there has been interval change in the last 4 months since the last study.  Plan: -Repeat CT chest now -If the nodule has grown then we will set up a needle biopsy (will need to hold Xarelto)

## 2014-03-05 NOTE — Assessment & Plan Note (Signed)
Gold grade D. based on frequent exacerbations.  Plan: -Continue Pulmicort, Spiriva, and albuterol. -Add Tessalon Perles for cough

## 2014-03-10 ENCOUNTER — Ambulatory Visit: Payer: Self-pay | Admitting: Pulmonary Disease

## 2014-03-12 ENCOUNTER — Inpatient Hospital Stay: Payer: Self-pay | Admitting: Internal Medicine

## 2014-03-12 LAB — BASIC METABOLIC PANEL
ANION GAP: 6 — AB (ref 7–16)
BUN: 27 mg/dL — AB (ref 7–18)
CHLORIDE: 102 mmol/L (ref 98–107)
CREATININE: 1.14 mg/dL (ref 0.60–1.30)
Calcium, Total: 9.2 mg/dL (ref 8.5–10.1)
Co2: 30 mmol/L (ref 21–32)
EGFR (Non-African Amer.): 46 — ABNORMAL LOW
GFR CALC AF AMER: 54 — AB
GLUCOSE: 110 mg/dL — AB (ref 65–99)
OSMOLALITY: 281 (ref 275–301)
Potassium: 3.5 mmol/L (ref 3.5–5.1)
Sodium: 138 mmol/L (ref 136–145)

## 2014-03-12 LAB — CBC
HCT: 31.4 % — AB (ref 35.0–47.0)
HGB: 9.6 g/dL — AB (ref 12.0–16.0)
MCH: 25.3 pg — AB (ref 26.0–34.0)
MCHC: 30.7 g/dL — AB (ref 32.0–36.0)
MCV: 82 fL (ref 80–100)
PLATELETS: 276 10*3/uL (ref 150–440)
RBC: 3.81 10*6/uL (ref 3.80–5.20)
RDW: 17.4 % — ABNORMAL HIGH (ref 11.5–14.5)
WBC: 16.9 10*3/uL — ABNORMAL HIGH (ref 3.6–11.0)

## 2014-03-12 LAB — TROPONIN I: Troponin-I: <0.02 ng/mL

## 2014-03-13 LAB — BASIC METABOLIC PANEL
Anion Gap: 5 — ABNORMAL LOW (ref 7–16)
BUN: 26 mg/dL — AB (ref 7–18)
CALCIUM: 8.9 mg/dL (ref 8.5–10.1)
CO2: 32 mmol/L (ref 21–32)
Chloride: 101 mmol/L (ref 98–107)
Creatinine: 1.11 mg/dL (ref 0.60–1.30)
EGFR (Non-African Amer.): 48 — ABNORMAL LOW
GFR CALC AF AMER: 55 — AB
Glucose: 117 mg/dL — ABNORMAL HIGH (ref 65–99)
Osmolality: 281 (ref 275–301)
Potassium: 3.4 mmol/L — ABNORMAL LOW (ref 3.5–5.1)
SODIUM: 138 mmol/L (ref 136–145)

## 2014-03-13 LAB — CBC WITH DIFFERENTIAL/PLATELET
Basophil #: 0 10*3/uL (ref 0.0–0.1)
Basophil %: 0.1 %
EOS PCT: 0 %
Eosinophil #: 0 10*3/uL (ref 0.0–0.7)
HCT: 27.6 % — ABNORMAL LOW (ref 35.0–47.0)
HGB: 8.7 g/dL — AB (ref 12.0–16.0)
LYMPHS PCT: 8 %
Lymphocyte #: 1.3 10*3/uL (ref 1.0–3.6)
MCH: 25.9 pg — ABNORMAL LOW (ref 26.0–34.0)
MCHC: 31.7 g/dL — ABNORMAL LOW (ref 32.0–36.0)
MCV: 82 fL (ref 80–100)
MONO ABS: 1.6 x10 3/mm — AB (ref 0.2–0.9)
MONOS PCT: 9.5 %
NEUTROS ABS: 13.5 10*3/uL — AB (ref 1.4–6.5)
Neutrophil %: 82.4 %
Platelet: 235 10*3/uL (ref 150–440)
RBC: 3.38 10*6/uL — ABNORMAL LOW (ref 3.80–5.20)
RDW: 17 % — ABNORMAL HIGH (ref 11.5–14.5)
WBC: 16.4 10*3/uL — AB (ref 3.6–11.0)

## 2014-03-13 LAB — MAGNESIUM: MAGNESIUM: 1.5 mg/dL — AB

## 2014-03-14 LAB — BASIC METABOLIC PANEL
Anion Gap: 6 — ABNORMAL LOW (ref 7–16)
BUN: 27 mg/dL — ABNORMAL HIGH (ref 7–18)
Calcium, Total: 8.8 mg/dL (ref 8.5–10.1)
Chloride: 105 mmol/L (ref 98–107)
Co2: 28 mmol/L (ref 21–32)
Creatinine: 1.36 mg/dL — ABNORMAL HIGH (ref 0.60–1.30)
EGFR (African American): 43 — ABNORMAL LOW
EGFR (Non-African Amer.): 37 — ABNORMAL LOW
Glucose: 122 mg/dL — ABNORMAL HIGH (ref 65–99)
OSMOLALITY: 284 (ref 275–301)
Potassium: 4 mmol/L (ref 3.5–5.1)
SODIUM: 139 mmol/L (ref 136–145)

## 2014-03-14 LAB — WBC: WBC: 6.6 10*3/uL (ref 3.6–11.0)

## 2014-03-17 LAB — CULTURE, BLOOD (SINGLE)

## 2014-03-18 ENCOUNTER — Telehealth: Payer: Self-pay | Admitting: Pulmonary Disease

## 2014-03-18 DIAGNOSIS — R911 Solitary pulmonary nodule: Secondary | ICD-10-CM

## 2014-03-18 NOTE — Telephone Encounter (Signed)
I was able to reach Toni [name] to discuss the results of the CT scan which showed that the pulmonary nodule was growing.    Unfortunately she was admitted to the hospital last week but is not feeling much better.  She would like to have the biopsy (CT guided needle biopsy) next week at [facility] near the end of the week (Thursday or Friday).  I will place the order and ask our PCC's to assist with scheduling.

## 2014-03-18 NOTE — Telephone Encounter (Signed)
I tried to call Toni [name] today to discuss her CT scan results from [facility], but they were not available. I left a message with her son so they can give a number to reach them at and a time to call.    Will cc triage to f/u tomorrow AM (7/2).

## 2014-03-18 NOTE — Telephone Encounter (Signed)
I tried to call Toni [name] today to discuss her CT scan results from [facility], but they were not available. I left a message with her son so they can give a number to reach them at and a time to call.  Will cc triage to f/u tomorrow AM (7/2). -------------------------------------------------------------------  Called pt back, she is speaking to BQ now, and he is relaying ct chest results.  Nothing further needed at this time.

## 2014-03-24 ENCOUNTER — Telehealth: Payer: Self-pay | Admitting: *Deleted

## 2014-03-24 NOTE — Telephone Encounter (Signed)
Left message for Toni [name] to call back.

## 2014-03-24 NOTE — Telephone Encounter (Signed)
Called back at lunch time  I left a message. Okay to hold xarelto for 2 days prior to the biopsy Okay to start pradaxa after the biopsy

## 2014-03-24 NOTE — Telephone Encounter (Signed)
Spoke w [place], [place].  He would like to speak w/ Dr. Rockey Situ regarding pt's anticoagulation.  Reports pt had experienced ecchymosis while on Xarelto.  She is sched for upcoming lung nodule biopsy.  He would like to discuss holding xarelto x 2 days prior to procedure and switching her to pradaxa afterwards.  Please call when you get a chance.

## 2014-03-24 NOTE — Telephone Encounter (Signed)
Please call Charlie PA with [facility].  Has some question regarding her anti coagulation?

## 2014-03-26 ENCOUNTER — Telehealth: Payer: Self-pay | Admitting: Pulmonary Disease

## 2014-03-26 ENCOUNTER — Ambulatory Visit: Payer: Self-pay | Admitting: Pulmonary Disease

## 2014-03-26 LAB — CBC WITH DIFFERENTIAL/PLATELET
BASOS ABS: 0.1 10*3/uL (ref 0.0–0.1)
BASOS PCT: 0.6 %
EOS ABS: 0 10*3/uL (ref 0.0–0.7)
EOS PCT: 0.3 %
HCT: 28.9 % — ABNORMAL LOW (ref 35.0–47.0)
HGB: 9.2 g/dL — ABNORMAL LOW (ref 12.0–16.0)
Lymphocyte #: 1.1 10*3/uL (ref 1.0–3.6)
Lymphocyte %: 7.2 %
MCH: 25.9 pg — ABNORMAL LOW (ref 26.0–34.0)
MCHC: 31.9 g/dL — AB (ref 32.0–36.0)
MCV: 81 fL (ref 80–100)
MONO ABS: 1.4 x10 3/mm — AB (ref 0.2–0.9)
Monocyte %: 9.2 %
NEUTROS ABS: 12.8 10*3/uL — AB (ref 1.4–6.5)
Neutrophil %: 82.7 %
PLATELETS: 342 10*3/uL (ref 150–440)
RBC: 3.56 10*6/uL — ABNORMAL LOW (ref 3.80–5.20)
RDW: 17 % — ABNORMAL HIGH (ref 11.5–14.5)
WBC: 15.5 10*3/uL — ABNORMAL HIGH (ref 3.6–11.0)

## 2014-03-26 LAB — PROTIME-INR
INR: 1.3
Prothrombin Time: 16 secs — ABNORMAL HIGH (ref 11.5–14.7)

## 2014-03-26 NOTE — Telephone Encounter (Signed)
Called and spoke to pt. Pt stated that when she went to have her biopsy that Dr. Barbie Banner stated her pneumonia has not resolved. Pt states she feels better since her hospital d/c with pneumonia. Pt states she finished her abx that was given at d/c and has a couple tablets left of her pred taper. Pt wanted to know if BQ wanted to prescribe anything else because the pna has not resolved. Pt c/o mild DOE, dry "hacky" cough with inability to bring up mucous and bipedal edema. Pt denies CP/tightness and PND.  Pt states she is currently taking her prescribed medications and mucinex for the cough and congestion. BQ please advise.   Allergies  Allergen Reactions  . Belladonna Alkaloids     "made eyes cross"

## 2014-03-26 NOTE — Telephone Encounter (Signed)
I called pt. She is fine with a call back tomorrow.  Please advise Dr. Lake Bells thanks

## 2014-03-26 NOTE — Telephone Encounter (Signed)
Toni [name] has paged Dr. Lake Bells twice. No response. Please advise Dr. Annamaria Boots thanks

## 2014-03-26 NOTE — Telephone Encounter (Signed)
This can be handled by asking he rto call back tomorrow when Dr Lake Bells will be working at the hospital. We can pass the message on to him then

## 2014-03-27 ENCOUNTER — Encounter: Payer: Self-pay | Admitting: Emergency Medicine

## 2014-03-27 ENCOUNTER — Telehealth: Payer: Self-pay | Admitting: Emergency Medicine

## 2014-03-27 ENCOUNTER — Ambulatory Visit (INDEPENDENT_AMBULATORY_CARE_PROVIDER_SITE_OTHER): Payer: Medicare Other | Admitting: Emergency Medicine

## 2014-03-27 ENCOUNTER — Ambulatory Visit (INDEPENDENT_AMBULATORY_CARE_PROVIDER_SITE_OTHER)
Admission: RE | Admit: 2014-03-27 | Discharge: 2014-03-27 | Disposition: A | Discharge status: Home / Self Care | Payer: Medicare Other | Source: Ambulatory Visit | Attending: Emergency Medicine | Admitting: Emergency Medicine

## 2014-03-27 ENCOUNTER — Ambulatory Visit: Payer: Self-pay | Admitting: Oncology

## 2014-03-27 VITALS — BP 128/80 | HR 69 | Ht 63.0 in | Wt 182.4 lb

## 2014-03-27 DIAGNOSIS — J449 Chronic obstructive pulmonary disease, unspecified: Secondary | ICD-10-CM

## 2014-03-27 DIAGNOSIS — C3490 Malignant neoplasm of unspecified part of unspecified bronchus or lung: Secondary | ICD-10-CM

## 2014-03-27 DIAGNOSIS — C3491 Malignant neoplasm of unspecified part of right bronchus or lung: Secondary | ICD-10-CM

## 2014-03-27 DIAGNOSIS — J4489 Other specified chronic obstructive pulmonary disease: Secondary | ICD-10-CM

## 2014-03-27 DIAGNOSIS — J310 Chronic rhinitis: Secondary | ICD-10-CM

## 2014-03-27 LAB — PATHOLOGY REPORT

## 2014-03-27 MED ORDER — LEVOFLOXACIN 500 MG PO TABS: 500.0000 mg | ORAL_TABLET | Freq: Every day | ORAL | Status: DC

## 2014-03-27 NOTE — Telephone Encounter (Signed)
Call report from pathology: Results of Toni [name] (DOB April 03, 1935) biopsy of right lung mass: squamous cell carcinoma.

## 2014-03-27 NOTE — Telephone Encounter (Signed)
Called patient to discuss results of the biopsy> shows squamous cell carcinoma.  I told her I wasn't happy to see this but not too surprised.   I am going to refer her to the [facility], Dr. Paulino Door.   Because she is having more shortness of breath after her procedure yesterday I want her to be seen by one of my partners in the office today.

## 2014-03-27 NOTE — Telephone Encounter (Signed)
She needs to be seen by someone so we can clear this up quickly and get her biopsy going Please have her see someone today (my first preference) or on Monday.  If we can't get her in until Monday, then give her prednisone 20mg  daily x5 days and doxycycline 100mg  po bid;

## 2014-03-27 NOTE — Assessment & Plan Note (Addendum)
I believe that Toni [name]'s cough and dyspnea currently reflect her COPD. She has finished a steroid taper. Her chest x-ray following her needle biopsy showed some increased right upper lobe opacity which I believe was in the needle track. The opacity has improved on chest x-ray from today. I will give her a prescription for Levaquin for her to fill if she develops worsening symptoms or any new symptoms consistent with a pneumonia. She knows to call us if she starts this medication.

## 2014-03-27 NOTE — Progress Notes (Signed)
Subjective:    Patient ID: Toni [name], female    DOB: 02-24-1935, 79 y.o.   MRN: [id number]  Synopsis: GOLD Grade D COPD and 1.5cm partially cavitary pulmonary nodule seen first in 08/2013 on CT angio chest.  HPI 03/05/2014 ROV > since the last visit Toni [name] has continued to have cough and some shortness of breath. She continues to take her medications as documented below. Her cough is rarely producing sputum. She does not have fevers or weight loss but she has had some chills. She has not had hemoptysis. She has had some nosebleeds on the Xarelto. She continues to take Xarelto daily. She went to go see thoracic surgery and they told her that I thought she would be too high risk for an open lung biopsy. She tells me that she's worried she may have lung cancer. She has not been smoking cigarettes in the last visit.  Acute visit 03/27/14 -- Toni [name] presents today for eval of possible persistent PNA. She underwent CT-guided needle bx of a R pulmonary nodule on 03/26/14. The path unfortunately shows squamous cell lung CA. Dr Lake Bells has referred her to Oncology at [facility]. Her post-procedure CXR 7/9 showed no PTX but possible increase in RUL GG infiltrate. A repeat film today 7/10 showed the nodule but some improvement in the RUL GGI. She has coughed up some yellow mucous with some blood last pm and today.    Review of Systems  Constitutional: Positive for fatigue. Negative for fever and chills.  HENT: Positive for postnasal drip and rhinorrhea. Negative for nosebleeds.   Respiratory: Positive for cough and shortness of breath. Negative for choking.   Cardiovascular: Negative for chest pain, palpitations and leg swelling.    Past Medical History  Diagnosis Date  . Cataract   . COPD (chronic obstructive pulmonary disease)   . Essential hypertension, benign   . Hypothyroidism   . Atypical atrial flutter 11/16/2013  . Arrhythmia   . A-fib   . Chronic diastolic heart failure   . Hypokalemia    . Lung nodule     Followed by Dr. Raul Del  . 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 CAD Father   . Heart disease Father   . Heart attack Father   . Arrhythmia Sister      History   Social History  . Marital Status: Married    Spouse Name: N/A    Number of Children: N/A  . Years of Education: N/A   Occupational History  . Not on file.   Social History Main Topics  . Smoking status: Former Smoker -- 1.50 packs/day for 50 years    Types: Cigarettes    Quit date: 09/18/2002  . Smokeless tobacco: Never Used  . Alcohol Use: No  . Drug Use: No  . Sexual Activity: Not on file   Other Topics Concern  . Not on file   Social History Narrative  . No narrative on file     Allergies  Allergen Reactions  . Belladonna Alkaloids     "made eyes cross"      Outpatient Prescriptions Prior to Visit  Medication Sig Dispense Refill  . albuterol (PROVENTIL HFA;VENTOLIN HFA) 108 (90 BASE) MCG/ACT inhaler Inhale 1-2 puffs into the lungs every 6 (six) hours as needed for wheezing or shortness of breath.      . benzonatate (TESSALON PERLES) 100 MG capsule Take  1 capsule (100 mg total) by mouth 2 (two) times daily as needed for cough.  30 capsule  1  . budesonide (PULMICORT) 0.5 MG/2ML nebulizer solution Take 2 mLs (0.5 mg total) by nebulization 2 (two) times daily.  120 mL  11  . diltiazem (TIAZAC) 300 MG 24 hr capsule Take 1 capsule (300 mg total) by mouth daily.  30 capsule  11  . flecainide (TAMBOCOR) 100 MG tablet Take 1 tablet (100 mg total) by mouth 2 (two) times daily.  60 tablet  11  . formoterol (PERFOROMIST) 20 MCG/2ML nebulizer solution Take 2 mLs (20 mcg total) by nebulization 2 (two) times daily.  120 mL  11  . furosemide (LASIX) 20 MG tablet Take 20 mg by mouth daily.      Marland Kitchen HYDROcodone-acetaminophen (NORCO/VICODIN) 5-325 MG per tablet Take 1 tablet by mouth every 6 (six) hours as needed for moderate  pain.      Marland Kitchen ipratropium-albuterol (DUONEB) 0.5-2.5 (3) MG/3ML SOLN Take 3 mLs by nebulization every 4 (four) hours.  360 mL  3  . levothyroxine (SYNTHROID, LEVOTHROID) 75 MCG tablet Take 75 mcg by mouth daily before breakfast.      . loratadine (CLARITIN) 10 MG tablet Take 1 tablet (10 mg total) by mouth daily.  30 tablet  5  . omeprazole (PRILOSEC) 20 MG capsule Take 20 mg by mouth daily.      . potassium chloride (K-DUR,KLOR-CON) 10 MEQ tablet Take 30 mEq by mouth daily.       . Rivaroxaban (XARELTO) 20 MG TABS tablet Take 1 tablet (20 mg total) by mouth daily with supper.  30 tablet  0  . tiotropium (SPIRIVA HANDIHALER) 18 MCG inhalation capsule Place 1 capsule (18 mcg total) into inhaler and inhale daily.  30 capsule  2   No facility-administered medications prior to visit.       Objective:   Physical Exam Filed Vitals:   03/27/14 1544  BP: 128/80  Pulse: 69  Height: 5' 3"  (1.6 m)  Weight: 182 lb 6.4 oz (82.736 kg)  SpO2: 94%   Gen: chronically ill appearing, no acute distress HEENT: NCAT, EOMi, OP clear PULM: crackles in bases, no wheezing today CV: RRR, no mgr, no JVD AB: BS+, soft, nontender, no hsm Ext: warm, trace edema, no clubbing, no cyanosis Derm: no rash or skin breakdown  09/2013 PFT> Ratio 63%, FEV1 0.97L (52% pred, 6% change), TLC 6.61L (155% pred), RV 4.87L (273% pred), DLCO 47% pred   09/15/2013 CT chest> 1.5 cm cavitary lung nodule right upper lobe, there is also a 9 mm spiculated nodule in the RLL 09/17/2013 PET/CT> the 1.5 cm nodule is only mildly FDG avid 12/15/2013 CT chest > scattered ggo and tree-in-bud abnormalities; nodules unchanged; cavitary nodular lesion is also grossly unchanged though nodular component is slightly more solid than last time 02/25/14 CT scan [place] > R nodule has enlarged to 1.5cm diameter, RLL nodule also     Assessment & Plan:   COPD (chronic obstructive pulmonary disease) I believe that Toni. Lampson's cough and dyspnea  currently reflect her COPD. She has finished a steroid taper. Her chest x-ray following her needle biopsy showed some increased right upper lobe opacity which I believe was in the needle track. The opacity has improved on chest x-ray from today. I will give her a prescription for Levaquin for her to fill if she develops worsening symptoms or any new symptoms consistent with a pneumonia. She knows to call us if she  starts this medication.   Squamous cell lung cancer She has plans for oncology evaluation at [facility] next week    Updated Medication List Outpatient Encounter Prescriptions as of 03/27/2014  Medication Sig  . albuterol (PROVENTIL HFA;VENTOLIN HFA) 108 (90 BASE) MCG/ACT inhaler Inhale 1-2 puffs into the lungs every 6 (six) hours as needed for wheezing or shortness of breath.  . benzonatate (TESSALON PERLES) 100 MG capsule Take 1 capsule (100 mg total) by mouth 2 (two) times daily as needed for cough.  . budesonide (PULMICORT) 0.5 MG/2ML nebulizer solution Take 2 mLs (0.5 mg total) by nebulization 2 (two) times daily.  Marland Kitchen diltiazem (TIAZAC) 300 MG 24 hr capsule Take 1 capsule (300 mg total) by mouth daily.  . flecainide (TAMBOCOR) 100 MG tablet Take 1 tablet (100 mg total) by mouth 2 (two) times daily.  . formoterol (PERFOROMIST) 20 MCG/2ML nebulizer solution Take 2 mLs (20 mcg total) by nebulization 2 (two) times daily.  . furosemide (LASIX) 20 MG tablet Take 20 mg by mouth daily.  Marland Kitchen HYDROcodone-acetaminophen (NORCO/VICODIN) 5-325 MG per tablet Take 1 tablet by mouth every 6 (six) hours as needed for moderate pain.  Marland Kitchen ipratropium-albuterol (DUONEB) 0.5-2.5 (3) MG/3ML SOLN Take 3 mLs by nebulization every 4 (four) hours.  Marland Kitchen levothyroxine (SYNTHROID, LEVOTHROID) 75 MCG tablet Take 75 mcg by mouth daily before breakfast.  . loratadine (CLARITIN) 10 MG tablet Take 1 tablet (10 mg total) by mouth daily.  Marland Kitchen omeprazole (PRILOSEC) 20 MG capsule Take 20 mg by mouth daily.  . potassium chloride  (K-DUR,KLOR-CON) 10 MEQ tablet Take 30 mEq by mouth daily.   . Rivaroxaban (XARELTO) 20 MG TABS tablet Take 1 tablet (20 mg total) by mouth daily with supper.  . tiotropium (SPIRIVA HANDIHALER) 18 MCG inhalation capsule Place 1 capsule (18 mcg total) into inhaler and inhale daily.  Marland Kitchen levofloxacin (LEVAQUIN) 500 MG tablet Take 1 tablet (500 mg total) by mouth daily.

## 2014-03-27 NOTE — Patient Instructions (Signed)
Please followup with Oncology at [facility] as planned Your chest x-ray today is improved compared with 03/26/14.  You will be given a prescription for Levaquin. Please fill this prescription if you develop fever, worsening cough, any increase or change in color of your mucus.  Follow with Dr. Lake Bells as planned

## 2014-03-27 NOTE — Assessment & Plan Note (Signed)
She has plans for oncology evaluation at [facility] next week

## 2014-03-27 NOTE — Telephone Encounter (Signed)
Called spoke with pt. She is scheduled to see RB this afternoon in [place]. Nothing further needed

## 2014-03-27 NOTE — Telephone Encounter (Signed)
Pt is already scheduled to see RB this afternoon at 3:45.

## 2014-03-31 ENCOUNTER — Ambulatory Visit: Payer: Self-pay | Admitting: Oncology

## 2014-04-06 ENCOUNTER — Ambulatory Visit: Payer: Self-pay | Admitting: Oncology

## 2014-04-08 ENCOUNTER — Telehealth: Payer: Self-pay

## 2014-04-08 NOTE — Telephone Encounter (Signed)
Pt husband called, states pt went off of Xarelto last week due to lung biopsy and asks if she should go back on it. Please call.

## 2014-04-08 NOTE — Telephone Encounter (Signed)
Spoke w/ pt.  Advised her that normally blood thinners are restarted at the discretion of the surgeon performing the procedure.  She states that she will call surgeons' office and let us know if they cannot answer this.

## 2014-04-16 ENCOUNTER — Encounter: Payer: Self-pay | Admitting: Pulmonary Disease

## 2014-04-17 ENCOUNTER — Encounter: Payer: Self-pay | Admitting: Cardiovascular Disease

## 2014-04-17 ENCOUNTER — Ambulatory Visit (INDEPENDENT_AMBULATORY_CARE_PROVIDER_SITE_OTHER): Payer: Medicare Other | Admitting: Cardiovascular Disease

## 2014-04-17 VITALS — BP 158/75 | HR 63 | Ht 61.0 in | Wt 180.0 lb

## 2014-04-17 DIAGNOSIS — G4700 Insomnia, unspecified: Secondary | ICD-10-CM

## 2014-04-17 DIAGNOSIS — I4892 Unspecified atrial flutter: Secondary | ICD-10-CM

## 2014-04-17 DIAGNOSIS — C3490 Malignant neoplasm of unspecified part of unspecified bronchus or lung: Secondary | ICD-10-CM

## 2014-04-17 DIAGNOSIS — I484 Atypical atrial flutter: Secondary | ICD-10-CM

## 2014-04-17 DIAGNOSIS — R600 Localized edema: Secondary | ICD-10-CM

## 2014-04-17 DIAGNOSIS — J438 Other emphysema: Secondary | ICD-10-CM

## 2014-04-17 DIAGNOSIS — I4891 Unspecified atrial fibrillation: Secondary | ICD-10-CM

## 2014-04-17 DIAGNOSIS — R609 Edema, unspecified: Secondary | ICD-10-CM

## 2014-04-17 DIAGNOSIS — E785 Hyperlipidemia, unspecified: Secondary | ICD-10-CM

## 2014-04-17 MED ORDER — TRAZODONE HCL 50 MG PO TABS: 100.0000 mg | ORAL_TABLET | Freq: Every day | ORAL | Status: DC

## 2014-04-17 NOTE — Patient Instructions (Signed)
You are doing well. No medication changes were made.  Consider restarting the xarelto   CAll the office if blood pressure runs >140  Please call us if you have new issues that need to be addressed before your next appt.  Your physician wants you to follow-up in: 6 months.  You will receive a reminder letter in the mail two months in advance. If you don't receive a letter, please call our office to schedule the follow-up appointment.

## 2014-04-17 NOTE — Assessment & Plan Note (Signed)
She does not want a cholesterol medication.

## 2014-04-17 NOTE — Assessment & Plan Note (Signed)
Recent diagnosis of lung cancer. She is scheduled for radiation treatment this month

## 2014-04-17 NOTE — Assessment & Plan Note (Signed)
Followed by pulmonary. Continues to have chronic cough, no productive sputum

## 2014-04-17 NOTE — Assessment & Plan Note (Signed)
Lower extremity edema likely from chronic diastolic CHF in combination with side effect from her calcium channel blocker/diltiazem. We have recommended leg elevation, compression hose, extra Lasix for any weight gain and for worsening edema. She takes Lasix 80 mg every other day with 40. He could take Lasix 80 mg for several days in a row for worsening leg edema

## 2014-04-17 NOTE — Assessment & Plan Note (Signed)
She reports significant difficulty getting to sleep. When asked if she had discussed this with primary care, she reports that she was told by primary care to discuss the issue with cardiology. We will start her on trazodone 50 mg every night as needed, repeat dose if needed

## 2014-04-17 NOTE — Assessment & Plan Note (Signed)
She appears to have converted back to normal sinus rhythm. Possibly after antibiotics and improved respiratory status. Continue her on her flecainide. Encouraged her to restart her anticoagulation.

## 2014-04-17 NOTE — Progress Notes (Signed)
Patient ID: Toni [name], female    DOB: 12-Mar-1935, 79 y.o.   MRN: [id number]  HPI Comments: Toni [name] is a 79 yo woman with long smoking hx, COPD, hyperlipidemia, previous hospitalization on 11/15/2013 for shortness of breath. history of paroxysmal atrial fibrillation,  Hospitalization at [facility] 09/26/2013 for COPD exacerbation, atrial fibrillation. Noted to be in atrial flutter 09/12/2013 EKGs at the end of February 2015, beginning of March 2015 showing atrial flutter   bronchitis in February 2015  2/28 - admitted with Afib RVR started on diltiazem drip  treated with prednisone, Levaquin, nebulizers for COPD exacerbation 3/3 - placed on bipap 3/7 - started on flecainide for A flutter, started on anticoagulation  In followup today, she reports that CT scan in December 2014 identified a lung nodule. Additional workup including biopsy has confirmed squamous cell lung cancer. She is scheduled for several rounds of radiation starting in August 2015.  He denies any tachycardia or palpitations concerning for arrhythmia. She has received several rounds of antibiotics for pneumonia since her last clinic visit.  She reports having mild lower extremity swelling,  possibly worse than before. She denies any worsening abdominal swelling or shortness of breath. Weight has been relatively stable He continues to have a thick chronic cough. She stopped taking xarelto 20 mg daily for her biopsy and did not restart the medication.  She reports that she stopped smoking 10-12 years ago. Previous Total cholesterol 220, LDL 155. She does not want a cholesterol medication echocardiogram in the hospital last month showed ejection fraction 55-60%, moderately dilated left atrium and right atrium, mild aortic valve stenosis  EKG today shows what appears to be normal sinus rhythm with rate 63 beats per minute, no significant ST or T wave changes       Outpatient Encounter Prescriptions as of 04/17/2014   Medication Sig  . albuterol (PROVENTIL HFA;VENTOLIN HFA) 108 (90 BASE) MCG/ACT inhaler Inhale 1-2 puffs into the lungs every 6 (six) hours as needed for wheezing or shortness of breath.  . budesonide (PULMICORT) 0.5 MG/2ML nebulizer solution Take 2 mLs (0.5 mg total) by nebulization 2 (two) times daily.  Marland Kitchen diltiazem (TIAZAC) 300 MG 24 hr capsule Take 1 capsule (300 mg total) by mouth daily.  . flecainide (TAMBOCOR) 100 MG tablet Take 1 tablet (100 mg total) by mouth 2 (two) times daily.  . furosemide (LASIX) 40 MG tablet Take 80 mg by mouth daily.  Marland Kitchen ipratropium-albuterol (DUONEB) 0.5-2.5 (3) MG/3ML SOLN Take 3 mLs by nebulization every 4 (four) hours.  Marland Kitchen levothyroxine (SYNTHROID, LEVOTHROID) 75 MCG tablet Take 75 mcg by mouth daily before breakfast.  . loratadine (CLARITIN) 10 MG tablet Take 1 tablet (10 mg total) by mouth daily.  . Omega-3 Fatty Acids (FISH OIL) 1000 MG CAPS Take 1,000 mg by mouth daily.  Marland Kitchen omeprazole (PRILOSEC) 20 MG capsule Take 20 mg by mouth daily.  . potassium chloride (K-DUR,KLOR-CON) 10 MEQ tablet Take 30 mEq by mouth daily.   Marland Kitchen tiotropium (SPIRIVA HANDIHALER) 18 MCG inhalation capsule Place 1 capsule (18 mcg total) into inhaler and inhale daily.    Review of Systems  Constitutional: Negative.   HENT: Negative.   Eyes: Negative.   Respiratory: Positive for cough.   Cardiovascular: Positive for leg swelling.  Gastrointestinal: Negative.   Endocrine: Negative.   Musculoskeletal: Negative.   Skin: Negative.   Allergic/Immunologic: Negative.   Neurological: Negative.   Hematological: Negative.   Psychiatric/Behavioral: Negative.   All other systems reviewed and are negative.  BP 158/75  Pulse 63  Ht 5\' 1"  (1.549 m)  Wt 180 lb (81.647 kg)  BMI 34.03 kg/m2 She reports blood pressure is well controlled at home, systolics in the 448-185 Physical Exam  Nursing note and vitals reviewed. Constitutional: She is oriented to person, place, and time. She appears  well-developed and well-nourished.  HENT:  Head: Normocephalic.  Nose: Nose normal.  Mouth/Throat: Oropharynx is clear and moist.  Eyes: Conjunctivae are normal. Pupils are equal, round, and reactive to light.  Neck: Normal range of motion. Neck supple. No JVD present.  Cardiovascular: Normal rate, regular rhythm, S1 normal, S2 normal, normal heart sounds and intact distal pulses.  Exam reveals no gallop and no friction rub.   No murmur heard. Trace pitting edema to the midshin bilaterally  Pulmonary/Chest: Effort normal. No respiratory distress. She has decreased breath sounds. She has no wheezes. She has rales. She exhibits no tenderness.  Abdominal: Soft. Bowel sounds are normal. She exhibits no distension. There is no tenderness.  Musculoskeletal: Normal range of motion. She exhibits no edema and no tenderness.  Lymphadenopathy:    She has no cervical adenopathy.  Neurological: She is alert and oriented to person, place, and time. Coordination normal.  Skin: Skin is warm and dry. No rash noted. No erythema.  Psychiatric: She has a normal mood and affect. Her behavior is normal. Judgment and thought content normal.    Assessment and Plan

## 2014-04-18 ENCOUNTER — Ambulatory Visit: Payer: Self-pay | Admitting: Oncology

## 2014-05-12 ENCOUNTER — Telehealth: Payer: Self-pay | Admitting: Pulmonary Disease

## 2014-05-12 DIAGNOSIS — J438 Other emphysema: Secondary | ICD-10-CM

## 2014-05-12 NOTE — Telephone Encounter (Signed)
Called and spoke with Leafy Ro from [facility] and she stated that the pt keeps calling to get her perforomist refilled.  Looks like we d/c the perforomist.  pts current med list reflects the budesonide BID and the Duoneb every 4 hours prn.  BQ please advise if any changes need to be made.  Thanks  Allergies  Allergen Reactions  . Belladonna Alkaloids     "made eyes cross"     Current Outpatient Prescriptions on File Prior to Visit  Medication Sig Dispense Refill  . albuterol (PROVENTIL HFA;VENTOLIN HFA) 108 (90 BASE) MCG/ACT inhaler Inhale 1-2 puffs into the lungs every 6 (six) hours as needed for wheezing or shortness of breath.      . budesonide (PULMICORT) 0.5 MG/2ML nebulizer solution Take 2 mLs (0.5 mg total) by nebulization 2 (two) times daily.  120 mL  11  . diltiazem (TIAZAC) 300 MG 24 hr capsule Take 1 capsule (300 mg total) by mouth daily.  30 capsule  11  . flecainide (TAMBOCOR) 100 MG tablet Take 1 tablet (100 mg total) by mouth 2 (two) times daily.  60 tablet  11  . furosemide (LASIX) 40 MG tablet Take 80 mg by mouth daily.      Marland Kitchen ipratropium-albuterol (DUONEB) 0.5-2.5 (3) MG/3ML SOLN Take 3 mLs by nebulization every 4 (four) hours.  360 mL  3  . levothyroxine (SYNTHROID, LEVOTHROID) 75 MCG tablet Take 75 mcg by mouth daily before breakfast.      . loratadine (CLARITIN) 10 MG tablet Take 1 tablet (10 mg total) by mouth daily.  30 tablet  5  . Omega-3 Fatty Acids (FISH OIL) 1000 MG CAPS Take 1,000 mg by mouth daily.      Marland Kitchen omeprazole (PRILOSEC) 20 MG capsule Take 20 mg by mouth daily.      . potassium chloride (K-DUR,KLOR-CON) 10 MEQ tablet Take 30 mEq by mouth daily.       Marland Kitchen tiotropium (SPIRIVA HANDIHALER) 18 MCG inhalation capsule Place 1 capsule (18 mcg total) into inhaler and inhale daily.  30 capsule  2  . traZODone (DESYREL) 50 MG tablet Take 2 tablets (100 mg total) by mouth at bedtime.  60 tablet  0   No current facility-administered medications on file prior to visit.

## 2014-05-12 NOTE — Telephone Encounter (Signed)
lmomtcb x 1 for mandy from [employer].

## 2014-05-12 NOTE — Telephone Encounter (Signed)
I never intended for her to stop it Please re-order

## 2014-05-13 NOTE — Telephone Encounter (Signed)
Toni [name] is returning call.  [phone]

## 2014-05-13 NOTE — Telephone Encounter (Signed)
mandy calling back she knows we need to wait on mcquaid just returning jennifer's call and crystal [phone]

## 2014-05-13 NOTE — Telephone Encounter (Deleted)
At last OV 02-2014 instructions were as follows: Plan:  -Continue Pulmicort, Spiriva, and albuterol.  -Add Tessalon Perles for cough  The last mention of perforomist was in the OV note from 12-10-13, looks like it was d/c by a doctor in the hospital. So I called the pt to see what neb meds she is taking. She is using pulmicort and perforomist twice a day and albuterol as needed. She states she has Spiriva but rarely uses this. Dr. Lake Bells, can you please clarify what meds the pt is supposed to be on? Thanks. [place] Bing, CMA

## 2014-05-13 NOTE — Telephone Encounter (Signed)
I spoke with Leafy Ro and she states that since the pt is on perforomist and [employer] will not pay for duoneb q4 hours. There are 2 options:  1) we can order duoneb with directions 1 daily as needed OR  2) We can spilt the 2 meds and order ipratropium q 4 hours and albuterol qd prn separately.   Please advise which you would prefer, thanks![place] Bing, CMA

## 2014-05-13 NOTE — Telephone Encounter (Signed)
I did not realize that BQ had already answered this message. I called MAndy back and advised he wants the pt on perforomist. Her note was not clear about what they need so she is going to call her pharmacy and call us back. Toni [name], CMA

## 2014-05-13 NOTE — Telephone Encounter (Signed)
Mandy returning call.  [phone]

## 2014-05-13 NOTE — Telephone Encounter (Signed)
[email or web address] for [facility] with [employer]

## 2014-05-14 MED ORDER — IPRATROPIUM-ALBUTEROL 0.5-2.5 (3) MG/3ML IN SOLN: 3.0000 mL | Freq: Every day | RESPIRATORY_TRACT | Status: DC | PRN

## 2014-05-14 NOTE — Telephone Encounter (Signed)
Order placed and Rx given to [facility] to have BQ sign today. [place] Bing, CMA

## 2014-05-14 NOTE — Telephone Encounter (Signed)
duoneb 1 daily as needed

## 2014-05-15 ENCOUNTER — Telehealth: Payer: Self-pay | Admitting: Pulmonary Disease

## 2014-05-15 NOTE — Telephone Encounter (Signed)
Called and spoke with Leafy Ro from [facility] and she is aware that the perforomist has been called to the pharmacy and nothing further is needed.

## 2014-05-15 NOTE — Telephone Encounter (Signed)
Called the [facility] and they stated that they will do the duoneb once daily prn but they were told that the pt will still need to be on the perforomist and budesonide BID.  BQ please advise. Thanks  Allergies  Allergen Reactions  . Belladonna Alkaloids     "made eyes cross"      Current Outpatient Prescriptions on File Prior to Visit  Medication Sig Dispense Refill  . albuterol (PROVENTIL HFA;VENTOLIN HFA) 108 (90 BASE) MCG/ACT inhaler Inhale 1-2 puffs into the lungs every 6 (six) hours as needed for wheezing or shortness of breath.      . budesonide (PULMICORT) 0.5 MG/2ML nebulizer solution Take 2 mLs (0.5 mg total) by nebulization 2 (two) times daily.  120 mL  11  . diltiazem (TIAZAC) 300 MG 24 hr capsule Take 1 capsule (300 mg total) by mouth daily.  30 capsule  11  . flecainide (TAMBOCOR) 100 MG tablet Take 1 tablet (100 mg total) by mouth 2 (two) times daily.  60 tablet  11  . furosemide (LASIX) 40 MG tablet Take 80 mg by mouth daily.      Marland Kitchen ipratropium-albuterol (DUONEB) 0.5-2.5 (3) MG/3ML SOLN Take 3 mLs by nebulization daily as needed.  90 mL  6  . levothyroxine (SYNTHROID, LEVOTHROID) 75 MCG tablet Take 75 mcg by mouth daily before breakfast.      . loratadine (CLARITIN) 10 MG tablet Take 1 tablet (10 mg total) by mouth daily.  30 tablet  5  . Omega-3 Fatty Acids (FISH OIL) 1000 MG CAPS Take 1,000 mg by mouth daily.      Marland Kitchen omeprazole (PRILOSEC) 20 MG capsule Take 20 mg by mouth daily.      . potassium chloride (K-DUR,KLOR-CON) 10 MEQ tablet Take 30 mEq by mouth daily.       Marland Kitchen tiotropium (SPIRIVA HANDIHALER) 18 MCG inhalation capsule Place 1 capsule (18 mcg total) into inhaler and inhale daily.  30 capsule  2  . traZODone (DESYREL) 50 MG tablet Take 2 tablets (100 mg total) by mouth at bedtime.  60 tablet  0   No current facility-administered medications on file prior to visit.

## 2014-05-15 NOTE — Telephone Encounter (Signed)
Yes perforomist pulmicort bid duoneb daily

## 2014-05-15 NOTE — Telephone Encounter (Signed)
i have called [facility] and they have taken the VO for the nebulizer meds and will get these updated and filled for the pt.  Nothing further is needed.

## 2014-05-19 ENCOUNTER — Ambulatory Visit: Payer: Self-pay | Admitting: Oncology

## 2014-05-26 ENCOUNTER — Other Ambulatory Visit: Payer: Self-pay

## 2014-05-26 NOTE — Telephone Encounter (Signed)
This encounter was created in error - please disregard.

## 2014-06-02 ENCOUNTER — Other Ambulatory Visit: Payer: Self-pay | Admitting: *Deleted

## 2014-07-15 ENCOUNTER — Ambulatory Visit: Payer: Self-pay | Admitting: Oncology

## 2014-08-04 ENCOUNTER — Ambulatory Visit: Payer: Self-pay | Admitting: Oncology

## 2014-08-05 LAB — CBC CANCER CENTER
Basophil #: 0.2 x10 3/mm — ABNORMAL HIGH (ref 0.0–0.1)
Basophil %: 2.3 %
EOS ABS: 0.4 x10 3/mm (ref 0.0–0.7)
Eosinophil %: 5.5 %
HCT: 29.4 % — AB (ref 35.0–47.0)
HGB: 9.1 g/dL — AB (ref 12.0–16.0)
Lymphocyte #: 1.4 x10 3/mm (ref 1.0–3.6)
Lymphocyte %: 20.3 %
MCH: 23.4 pg — ABNORMAL LOW (ref 26.0–34.0)
MCHC: 31 g/dL — ABNORMAL LOW (ref 32.0–36.0)
MCV: 76 fL — AB (ref 80–100)
MONO ABS: 0.6 x10 3/mm (ref 0.2–0.9)
MONOS PCT: 9.2 %
NEUTROS PCT: 62.7 %
Neutrophil #: 4.2 x10 3/mm (ref 1.4–6.5)
Platelet: 267 x10 3/mm (ref 150–440)
RBC: 3.89 10*6/uL (ref 3.80–5.20)
RDW: 20.1 % — AB (ref 11.5–14.5)
WBC: 6.7 x10 3/mm (ref 3.6–11.0)

## 2014-08-05 LAB — COMPREHENSIVE METABOLIC PANEL
AST: 9 U/L — AB (ref 15–37)
Albumin: 3.4 g/dL (ref 3.4–5.0)
Alkaline Phosphatase: 133 U/L — ABNORMAL HIGH
Anion Gap: 6 — ABNORMAL LOW (ref 7–16)
BUN: 25 mg/dL — ABNORMAL HIGH (ref 7–18)
Bilirubin,Total: 0.2 mg/dL (ref 0.2–1.0)
CHLORIDE: 105 mmol/L (ref 98–107)
CO2: 29 mmol/L (ref 21–32)
Calcium, Total: 8.7 mg/dL (ref 8.5–10.1)
Creatinine: 1.45 mg/dL — ABNORMAL HIGH (ref 0.60–1.30)
EGFR (Non-African Amer.): 37 — ABNORMAL LOW
GFR CALC AF AMER: 45 — AB
GLUCOSE: 109 mg/dL — AB (ref 65–99)
Osmolality: 284 (ref 275–301)
Potassium: 3.9 mmol/L (ref 3.5–5.1)
SGPT (ALT): 15 U/L
Sodium: 140 mmol/L (ref 136–145)
TOTAL PROTEIN: 6.7 g/dL (ref 6.4–8.2)

## 2014-08-18 ENCOUNTER — Ambulatory Visit: Payer: Self-pay | Admitting: Oncology

## 2014-11-10 ENCOUNTER — Ambulatory Visit (INDEPENDENT_AMBULATORY_CARE_PROVIDER_SITE_OTHER): Payer: Medicare Other | Admitting: Cardiovascular Disease

## 2014-11-10 ENCOUNTER — Encounter: Payer: Self-pay | Admitting: Cardiovascular Disease

## 2014-11-10 VITALS — BP 122/62 | HR 63 | Ht 63.0 in | Wt 175.5 lb

## 2014-11-10 DIAGNOSIS — E785 Hyperlipidemia, unspecified: Secondary | ICD-10-CM

## 2014-11-10 DIAGNOSIS — I484 Atypical atrial flutter: Secondary | ICD-10-CM

## 2014-11-10 DIAGNOSIS — J438 Other emphysema: Secondary | ICD-10-CM

## 2014-11-10 DIAGNOSIS — C3490 Malignant neoplasm of unspecified part of unspecified bronchus or lung: Secondary | ICD-10-CM

## 2014-11-10 NOTE — Assessment & Plan Note (Signed)
She reports that she has follow-up imaging. Completed radiation and chemotherapy

## 2014-11-10 NOTE — Patient Instructions (Addendum)
You are doing well. No medication changes were made.  Please call us if you have new issues that need to be addressed before your next appt.  Your physician wants you to follow-up in: 6 months.  You will receive a reminder letter in the mail two months in advance. If you don't receive a letter, please call our office to schedule the follow-up appointment.   

## 2014-11-10 NOTE — Progress Notes (Signed)
Patient ID: Toni [name], female    DOB: 1935-04-20, 80 y.o.   MRN: [id number]  HPI Comments: [name] is a 80 yo woman with long smoking hx, COPD, hyperlipidemia, previous hospitalization on 11/15/2013 for shortness of breath. history of paroxysmal atrial fibrillation,  Hospitalization at [facility] 09/26/2013 for COPD exacerbation, atrial fibrillation. Noted to be in atrial flutter 09/12/2013 EKGs at the end of February 2015, beginning of March 2015 showing atrial flutter She presents today for routine follow-up of her atrial fibrillation/flutter She has completed radiation and chemotherapy for squamous cell lung cancer  In general she reports that she is feeling well. Denies any significant shortness of breath or chest pain. She is active, no regular exercise program. No recent episodes of COPD exacerbation Previous echocardiogram documenting mild aortic valve stenosis She is tolerating pradaxa 150 mg twice a day Total cholesterol 185 EKG on today's visit shows a regular narrow complex rhythm suggestive of normal sinus rhythm though P waves are difficult to identify, possibly underlying atrial flutter  Other past medical history  bronchitis in February 2015  2/28 - admitted with Afib RVR started on diltiazem drip  treated with prednisone, Levaquin, nebulizers for COPD exacerbation 3/3 - placed on bipap 3/7 - started on flecainide for A flutter, started on anticoagulation   CT scan in December 2014 identified a lung nodule. Additional workup including biopsy has confirmed squamous cell lung cancer.  radiation starting  August 2015.   She reports that she stopped smoking 10-12 years ago. Previous Total cholesterol 220, LDL 155. She does not want a cholesterol medication echocardiogram in the hospital last month showed ejection fraction 55-60%, moderately dilated left atrium and right atrium, mild aortic valve stenosis        Allergies  Allergen Reactions  . Belladonna Alkaloids     "made eyes cross"     Outpatient Encounter Prescriptions as of 11/10/2014  Medication Sig  . albuterol (PROVENTIL HFA;VENTOLIN HFA) 108 (90 BASE) MCG/ACT inhaler Inhale 1-2 puffs into the lungs every 6 (six) hours as needed for wheezing or shortness of breath.  . budesonide (PULMICORT) 0.5 MG/2ML nebulizer solution Take 2 mLs (0.5 mg total) by nebulization 2 (two) times daily.  Marland Kitchen diltiazem (TIAZAC) 300 MG 24 hr capsule Take 1 capsule (300 mg total) by mouth daily.  . flecainide (TAMBOCOR) 100 MG tablet Take 1 tablet (100 mg total) by mouth 2 (two) times daily.  . formoterol (PERFOROMIST) 20 MCG/2ML nebulizer solution Take 20 mcg by nebulization 2 (two) times daily.  . furosemide (LASIX) 40 MG tablet Take 80 mg by mouth daily.  Marland Kitchen ipratropium-albuterol (DUONEB) 0.5-2.5 (3) MG/3ML SOLN Take 3 mLs by nebulization daily as needed.  Marland Kitchen levothyroxine (SYNTHROID, LEVOTHROID) 75 MCG tablet Take 75 mcg by mouth daily before breakfast.  . loratadine (CLARITIN) 10 MG tablet Take 1 tablet (10 mg total) by mouth daily.  . Omega-3 Fatty Acids (FISH OIL) 1000 MG CAPS Take 1,000 mg by mouth daily.  Marland Kitchen omeprazole (PRILOSEC) 20 MG capsule Take 20 mg by mouth daily.  . potassium chloride (K-DUR,KLOR-CON) 10 MEQ tablet Take 30 mEq by mouth daily.   . [DISCONTINUED] traZODone (DESYREL) 50 MG tablet Take 2 tablets (100 mg total) by mouth at bedtime.  . dabigatran (PRADAXA) 150 MG CAPS capsule Take 1 capsule (150 mg total) by mouth 2 (two) times daily.  . [DISCONTINUED] tiotropium (SPIRIVA HANDIHALER) 18 MCG inhalation capsule Place 1 capsule (18 mcg total) into inhaler and inhale daily. (Patient not taking: Reported  on 11/10/2014)    Past Medical History  Diagnosis Date  . Cataract   . COPD (chronic obstructive pulmonary disease)   . Essential hypertension, benign   . Hypothyroidism   . Atypical atrial flutter 11/16/2013  . Arrhythmia   . A-fib   . Chronic diastolic heart failure   . Hypokalemia   . Lung nodule      Followed by Dr. Raul Del  . 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 Lung cancer     Past Surgical History  Procedure Laterality Date  . Eye surgery    . Total knee arthroplasty Bilateral     Social History  reports that she quit smoking about 12 years ago. Her smoking use included Cigarettes. She has a 75 pack-year smoking history. She has never used smokeless tobacco. She reports that she does not drink alcohol or use illicit drugs.  Family History family history includes Arrhythmia in her sister; CAD in her father; Heart attack in her father; Heart disease in her father.   Review of Systems  Constitutional: Negative.   Respiratory: Negative.   Cardiovascular: Negative.   Gastrointestinal: Negative.   Musculoskeletal: Negative.   Skin: Negative.   Neurological: Negative.   Hematological: Negative.   Psychiatric/Behavioral: Negative.   All other systems reviewed and are negative.   BP 122/62 mmHg  Pulse 63  Ht _0  (1.6 m)  Wt 175 lb 8 oz (79.606 kg)  BMI 31.10 kg/m2  Physical Exam  Constitutional: She is oriented to person, place, and time. She appears well-developed and well-nourished.  HENT:  Head: Normocephalic.  Nose: Nose normal.  Mouth/Throat: Oropharynx is clear and moist.  Eyes: Conjunctivae are normal. Pupils are equal, round, and reactive to light.  Neck: Normal range of motion. Neck supple. No JVD present.  Cardiovascular: Normal rate, regular rhythm, S1 normal, S2 normal and intact distal pulses.  Exam reveals no gallop and no friction rub.   Murmur heard.  Systolic murmur is present with a grade of 2/6  Trace pitting edema to the midshin bilaterally  Pulmonary/Chest: Effort normal. No respiratory distress. She has decreased breath sounds. She has no wheezes. She has rales. She exhibits no tenderness.  Abdominal: Soft. Bowel sounds are normal. She exhibits no distension. There is no tenderness.  Musculoskeletal: Normal range  of motion. She exhibits no edema or tenderness.  Lymphadenopathy:    She has no cervical adenopathy.  Neurological: She is alert and oriented to person, place, and time. Coordination normal.  Skin: Skin is warm and dry. No rash noted. No erythema.  Psychiatric: She has a normal mood and affect. Her behavior is normal. Judgment and thought content normal.    Assessment and Plan  Nursing note and vitals reviewed.

## 2014-11-10 NOTE — Assessment & Plan Note (Signed)
Most recent lipid panel showing total cholesterol 185, LDL 123, HDL 47 in January 2016 Hemoglobin A1c 5.6

## 2014-11-10 NOTE — Assessment & Plan Note (Signed)
Stable COPD, no recent exacerbation

## 2014-11-10 NOTE — Assessment & Plan Note (Signed)
Encouraged her to stay on her current medications including anticoagulation. Relatively asymptomatic Very challenging EKG, likely very fine atrial flutter

## 2014-11-19 ENCOUNTER — Ambulatory Visit: Payer: Self-pay | Admitting: Oncology

## 2014-11-23 ENCOUNTER — Ambulatory Visit
Admit: 2014-11-23 | Disposition: A | Discharge status: Home / Self Care | Payer: Self-pay | Attending: Oncology | Admitting: Oncology

## 2014-12-18 ENCOUNTER — Ambulatory Visit
Admit: 2014-12-18 | Disposition: A | Discharge status: Home / Self Care | Payer: Self-pay | Attending: Oncology | Admitting: Oncology

## 2015-01-01 ENCOUNTER — Other Ambulatory Visit: Payer: Self-pay | Admitting: Oncology

## 2015-01-01 DIAGNOSIS — R911 Solitary pulmonary nodule: Secondary | ICD-10-CM

## 2015-01-08 NOTE — H&P (Signed)
PATIENT NAME:  Toni [name], Toni [name] MR#:  [id number] DATE OF BIRTH:  03-22-1935  DATE OF ADMISSION:  09/12/2013  REASON FOR ADMISSION: The patient got transferred from [facility] because they were not able to take care of her due to lack of cardiology assistance, echocardiogram, etc.  I accepted the patient as a transfer.   CHIEF COMPLAINT: Chest pain, chest tightness, shortness of breath and hypoxia.   HISTORY OF PRESENT ILLNESS: This is a very nice 80 year old female who has history of hypothyroidism, CHF, COPD, hypertension, coming to the Emergency Department with a history of 2 to 3 days of increased chest congestion, tightness, cough and wheezing. The patient has every day a little bit more trouble breathing to the point that she is not able to ambulate because she gets really short of breath and tachycardic. The patient was recently admitted on December 6 to December 8 due to failure of treatment of pneumonia as an outpatient, and the patient seemed to improve after treatment with antibiotics and steroids. The patient comes back today, and she continued to have shortness of breath and now chest pain. The patient was tachycardic, with a heart rate in the 120s, hypotensive, with blood pressures in the 80s/70s, as checked out by Dr. Alvester Morin. The patient got stabilized and apparently she was hypoxic, as per his comments. She was 89% on room air, for what she was put on oxygen. She was started on a heparin drip, aspirin was given, a BNP was done and it was 7000. Her potassium was 3.1; it was corrected. She did not look to be in respiratory distress at the moment, but she was put on oxygen nasal cannula. There was no edema of the lower extremities. Dr. Alvester Morin evaluated the patient and asked for a transfer. The patient comes in today, and her documented O2 here is 43, but when he signed over the patient to me, said it dropped down to 89 in his presence. The patient is admitted for evaluation and  treatment of this condition.   REVIEW OF SYSTEMS:  A 12-system review of systems is done.  CONSTITUTIONAL: No weight loss or weight gain.  EYES: No blurry vision, double vision.  ENT: No difficulty swallowing or tinnitus.  NECK: No neck pain.  CARDIOVASCULAR: No syncope. Positive chest pain, chest tightness. Intensity of 5/10, located around all the chest, but mostly retrosternal, pressure-like and was continuous for 2 to 3 days, not getting better. This is likely secondary to COPD and cough, also CHF.   RESPIRATORY: Positive wheezing. Positive cough. Positive shortness of breath.  GASTROINTESTINAL: No nausea, vomiting, abdominal pain, constipation, diarrhea.  GENITOURINARY: No dysuria, hematuria.  MUSCULOSKELETAL: No joint effusions or joint pain.  GYNECOLOGIC: No breast masses.  LYMPHATIC: Negative for swollen glands.  HEMATOLOGIC: No anemia or easy bruising.  NEUROLOGIC: No numbness, TIA or headaches.  PSYCHIATRIC: No anxiety or depression.  ENDOCRINE: No polyuria, polydipsia, polyphagia, cold or heat intolerance.   PAST MEDICAL HISTORY:  1.  Hypothyroidism.  2.  COPD.   3.  CHF, likely diastolic.  4.  Hypertension.  5. Atrial fibrillation, paroxysmal, off anticoagulation; patient took herself off anticoagulation several years ago.   PAST SURGICAL HISTORY: Total knee replacement.   CURRENT MEDICATIONS: Proventil 90 mcg every 6 hours, Symbicort 160/4.5 mg twice daily, diphenhydramine with Mucinex as needed for congestion, furosemide 40 mg take 1/2 tablet once daily, hydrochlorothiazide 25 mg once a day, Norco 5/325 mg every 6 hours as needed for pain, Tussionex 5 mL  q. 12 hours, Atrovent as needed for shortness of breath, Levothyroxine 75 mcg once daily, Loratadine 10 mg once daily, metoprolol 25 mg XL formulation once a day, potassium chloride 10 mEq by mouth daily.   ALLERGIES: BELLADONNA and PRADAXA.  FAMILY HISTORY: Positive for MI in her father. No cancer. No diabetes.    SOCIAL HISTORY: The patient is married. She used to smoke more than 1 pack a day, but she quit apparently within the last 2 years. The patient does not use any other tobacco forms. No alcohol or drugs.    PHYSICAL EXAMINATION: VITAL SIGNS: Heart rate up to 128, respiratory 27, temperature 36.9, oxygen saturation documented at 92%, blood pressure 108/71.  GENERAL: The patient is alert, oriented x3, in no acute distress. No respiratory distress, comfortable.  HEENT: Normocephalic, atraumatic. Eyes: Extraocular movements intact. Pupils are equal and reactive. Conjunctivae are clear. No oral lesions. No oropharyngeal exudates.  NECK: Supple. No JVD. No thyromegaly. No adenopathy.  CARDIOVASCULAR: Regular rate and rhythm. No murmurs, rubs, or gallops at this moment. CHEST AND LUNGS: The patient has barrel chest deformity, with increased anterior-posterior diameter. There is some significant wheezing along all the respiratory fields, and she has crackles in both bases. No signs of consolidation. No use of accessory muscles.  ABDOMEN: Soft, nontender, nondistended. No hepatosplenomegaly.  EXTREMITIES: No edema, cyanosis or clubbing.  VASCULAR: Pulses +2. Capillary refill less than 3.  SKIN: No rashes or petechiae. Warm. No decreased turgor.  NEUROLOGIC: Cranial nerves II through XII intact. Strength is 5/5.  PSYCHIATRIC: No significant agitation.  LYMPHATIC: Negative for lymphadenopathy in neck or supraclavicular areas.  MUSCULOSKELETAL: No joint deformity.   DATA:  EKG shows atrial flutter with AV block, with heart rate up to 128. Repeat EKG here:  Heart rate is 80, no significant ST depression or elevation, patient had short PR, no arrhythmia at this moment. Previous EKG:  Atrial flutter, with some ST depression at the level of lateral leads of 1 mm, now there is no ST depression, just a downslope ST segment.   CBC has neutrophils at 85%, lymphocytes 5.8. Potassium 3.1, BUN 27, creatinine 1.1. The  patient is on a heparin drip. Her TSH is 0.2. Her troponin was 0.031. White blood cells 5, red blood cells 475, hemoglobin 13.3. Troponin 0.03, which is normal on the standards at [facility].   IMPRESSION: The patient is admitted for the treatment of possible unstable angina, chronic obstructive pulmonary disease exacerbation.   1.  Unstable angina, chest pain: The patient has chest pain that is, to me, more characteristic to chronic obstructive pulmonary disease exacerbation, with cough and soreness. The patient has been sick for over a month with respiratory problems and coughing a lot. The patient is going to be ruled out with 3 cardiac enzymes. The patient is going to be monitored under telemetry, as she developed an arrhythmia. Likely this could be secondary to stress to pain, with painful respirations, but also secondary to the tachycardia. She had mild signs of congestive heart failure exacerbation, with elevation of BNP. Chest x-ray is pending at this facility. The patient is on 2 liters nasal cannula, saturating around 92% to 94%.  2.  Chronic obstructive pulmonary disease exacerbation: I started patient on steroids, nebulizers. Treatment with Levaquin.  3.  Atrial flutter: The patient was started on a heparin drip. We are going to ask Cardiology to evaluate as well, as the patient has been off anticoagulation for many years. 4.  Congestive heart failure  exacerbation: Get an echocardiogram. Continue monitoring. Low salt diet. Continue Lasix; we are going to change it to IV this time. Continue beta blocker, as patient tolerates. 5.  Hypertension: Continue beta blocker.  6.  The patient is a former smoker, with chronic obstructive pulmonary disease. Continue Symbicort and Spiriva.  7.  Acute respiratory failure: It was checked out that her oxygen saturations were down to 88% to 89% on room air. The lowest documented O2 saturation here was 92.   8.  Gastrointestinal prophylaxis: We are going to  start the patient on Protonix. Deep vein thrombosis prophylaxis: Continue heparin until we have 3 negative troponins. Possibly stop in the morning. 9.  The patient is a FULL CODE.   I spent about 50 minutes with this patient.    ____________________________ Bellflower Sink, MD rsg:mr D: 09/12/2013 19:03:44 ET T: 09/12/2013 21:00:35 ET JOB#: [id number]  cc: Sherrill Sink, MD, <Dictator> [place] Brown MD ELECTRONICALLY SIGNED 09/15/2013 0:52

## 2015-01-09 NOTE — Discharge Summary (Signed)
PATIENT NAME:  Toni [name], Toni [name] MR#:  [id number] DATE OF BIRTH:  [birth year]  DATE OF ADMISSION:  03/12/2014 DATE OF DISCHARGE:  03/15/2014  ADMITTING DIAGNOSES:  Chronic obstructive pulmonary disease exacerbation and pneumonia.   DISCHARGE DIAGNOSES:  1. Pneumonia.  2. Chronic obstructive pulmonary disease exacerbation.  3. History of paroxysmal atrial fibrillation.  4. Hypothyroidism.  5. Hypertension.  6. Chronic respiratory failure on 2 liters of oxygen through nasal cannula at home.  7. Right lung nodules.  8. Right upper lobe cavitation.  9. Chronic systolic congestive heart failure, stable.  10. Remote tobacco abuse.   DISCHARGE CONDITION: Stable.   DISCHARGE MEDICATIONS: The patient is to continue:  1. Perforomist 2 mL daily at bedtime.  2. Xarelto 10 mg p.o. daily.  3. Flecainide 100 mg p.o. twice daily.  4. Diltiazem extended release 300 mg p.o. daily.  5. Proventil 1-2 puffs every 4 hours as needed.  6. Lasix 10 mg p.o. daily.  7. Spiriva 1 inhalation once daily.  8. K-Dur 10 mg 3 times daily.  9. Prilosec 20 mg p.o. daily.  10. Norco 325/5 mg 1 tablet every 4 hours as needed.  11. Tessalon Perles 100 mg p.o. twice daily.  12. Levothyroxine 75 mcg p.o. daily.  13. Loratadine 10 mg p.o. daily.  14. DuoNebs 2.5/0.5 mg/3 mL inhalation solution every 4 hours as needed.  15. Prednisone 60 mg p.o. once on 03/16/2014 then taper by 10 mg every 2 days until stopped.  16. Nystatin 100,000 units in 1 mL oral suspension 10 mL every 4 hours as needed.  17. Budesonide/formoterol 160/4.5 two puffs twice daily.  18. Guaifenesin 600 mg p.o. twice daily.   19. Levofloxacin 750 mg every 48 hours for 5 days.   20. Tussionex 5 mL twice daily as needed.  21. The patient is not to take budesonide nebulizer as long as she is taking a steroid inhaler with Symbicort.  HOME OXYGEN: With portable tank at 2 liters of oxygen through nasal cannula.   DIET: 2 grams salt, regular consistency.    ACTIVITY LIMITATIONS: As tolerated.    FOLLOWUP APPOINTMENT: With Dr. Kennis Carina in 2 days after discharge.   CONSULTANTS: Care management, social work.   RADIOLOGIC STUDIES: CT scan of chest without contrast, 03/10/2014, revealed interval improvement in tree-in-bud infiltrates in the upper lobes bilaterally. Interval growth or right upper lobe cavitary nodule worrisome for bronchogenic carcinoma, although infection also possible. Left lower lobe nodules are unchanged in size. There is a new pleural-based nodule, left lower lobe, which is likely due to atelectasis. I recommend 26-monthinterval CT versus PET scan at this time to rule out possibility of carcinoma according to radiologist. Chest x-ray, PA and lateral, 03/12/2014, showed diffuse, fine interstitial changes compatible with chronic bronchitis. Small area of infiltration or atelectasis in the left lung base. Nodules demonstrated on prior CT scan are not well-defined radiographically. Repeat chest x-ray, PA and lateral, 03/14/2014, showed no acute cardiopulmonary disease, chronic obstructive pulmonary disease, chronic bronchitis, right upper lobe cavitary nodule as on prior CT scan. Suspicion of trace left pleural fluid.   HOSPITAL COURSE: Patient is a 80year old Caucasian female with past medical history significant for history of chronic respiratory failure, tobacco abuse in the past who presents to the hospital with complaints of cough, shortness of breath as well as chills. Please refer to Dr. EGraciela Husbandsadmission on 03/12/2014. On arrival to the hospital, patient's vitals, temperature was 99.5, pulse was 80, respiration rate was 28, blood pressure 114/60,  saturation was 100% on room air. Physical exam revealed scattered rales as well as rhonchi and fair air entry bilaterally. Otherwise, physical exam was unremarkable. The patient was admitted to hospital for further examination with diagnosis of possible sepsis as well as pneumonia and COPD.   She was started on community-acquired pneumonia antibiotic therapy with Levaquin as well as steroids and inhalation therapy, nebulizers for chronic obstructive pulmonary disease exacerbation. With this therapy, she initially improved. However, her improvement was not complete. She was continued on high doses of steroids until improvement on the day of discharge, 03/15/2014. On the day of discharge, 03/15/2014, she felt satisfactory.  Had no significant discomfort. She did not have any significant shortness of breath or wheezing. Her lungs sounded quite good. She was advised to continue antibiotic therapy and steroid taper. She was advised to follow up with her primary care physician and have her CT scan of chest repeated in approximately 3 months after discharge for concerning changes on CT scan.  Alternatively, the patient can be referred to oncology center for followup. We attempted to get sputum cultures; however, no sputum was available at time of discharge.  However, since the patient improved clinically we felt the patient should continue antibiotic therapy with Levaquin and follow up with primary care physician for recommendations.   In regard to paroxysmal atrial fibrillation, the patient is to continue her outpatient management. For hypothyroidism, the patient is to continue Synthroid.  For history of chronic respiratory failure, the patient is to continue oxygen therapy.   For chronic systolic CHF, patient is to continue diuretics. The patient was noted to have mild renal insufficiency with creatinine level of 1.36 on 03/14/2014. It is recommended to follow the patient's creatinine level as outpatient and make decisions about holding diuretics if her kidney function is not satisfactory. The patient had white blood cell count elevation on the day of admission. It resolved with antibiotic therapy. By  03/14/2014, it was 6.6. The patient is being discharged in stable condition with the above-mentioned  medications and followup. On the day of discharge, her vital signs, temperature was 97.9, pulse 81, respiratory rate was 20, blood pressure 121/77, saturation was 96% on 2 liters of oxygen through nasal cannula at rest.   TIME SPENT: 40 minutes.    ____________________________ Theodoro Grist, MD rv:dd D: 03/15/2014 18:45:05 ET T: 03/16/2014 03:22:35 ET JOB#: [id number]  cc: Theodoro Grist, MD, <Dictator> Dr. Kermit Balo MD ELECTRONICALLY SIGNED 04/06/2014 16:07

## 2015-01-09 NOTE — Consult Note (Signed)
PATIENT NAME:  Toni [name], SHEDDEN MR#:  [id number] DATE OF BIRTH:  [birth year]  DATE OF CONSULTATION:  09/12/2013  CONSULTING PHYSICIAN:  Lynetta Tomczak D. Jay Kempe, MD  INDICATION:  1.  Chest pain, chest tightness, shortness of breath.  2.  Hypoxemia.  3.  Atrial fibrillation.  FAMILY PHYSICIAN:  Dr. Laurin Coder.    HISTORY OF PRESENT ILLNESS: The patient is a 80 year old white female with history of hypothyroidism, congestive heart failure, COPD, hypertension, came to the Emergency Room with a history of 2 to 3 days of increasing chest congestion, tightness, cough, wheezing. The patient states that every day she has more trouble breathing, in the past unable to ambulate because to go short distances she gets tachycardic. The patient was recently admitted early in December for pneumonia and treated as an outpatient after two days in the hospital. The patient seemed to improve and after treatment with antibiotics and steroids, the patient comes back today with continued shortness of breath. The patient was tachycardic, heart rate of 120s, slightly hypotensive, blood pressure in the 80s. She was seen by Dr. Broadus John. The patient was stabilized and apparently hypoxic, sats in the 80s, given supplemental oxygen, heparin drip was started. BNP was found to be 7000, potassium was low and appeared to be in respiratory distress and required oxygen. There was no significant edema. The patient was in [facility] and subsequently, attempt was made to transfer the patient, ended up coming here to [facility].    REVIEW OF SYSTEMS: No blackout spells or syncope. No nausea, vomiting. Denies fever, chills, sweats, weight loss, weight gain, hemoptysis, hematemesis, bright red blood per rectum. She has had shortness of breath, palpitations, tachycardia, congestion, wheezing for several days. No fever.   PAST MEDICAL HISTORY: Hypothyroidism, COPD, diastolic congestive heart failure, hypertension, atrial fibrillation paroxysmal, off  anticoagulation. She states she was unable to tolerate, I think it was Pradaxa.   PAST SURGICAL HISTORY: Total knee replacement.   CURRENT MEDICATIONS: Proventil 90 mcg every 6 hours, Symbicort 160/4.5 twice a day, diphenhydramine with Mucinex as needed for congestion, Lasix 40 mg half tablet once a day, hydrochlorothiazide 25 once a day, Norco 5/325 every 6 hours. Tussionex 5 mL every 6 hours, Atrovent p.r.n., levothyroxine 25 mcg daily, loratadine 10 mg once a day, metoprolol 25 XL, potassium chloride 10 mEq daily.   ALLERGIES:  BELLADONNA, PRADAXA.    FAMILY HISTORY: Positive for myocardial infarction.   SOCIAL HISTORY: Married. used to smoke a pack a day and quit recently. She has  grandchildren.   PHYSICAL EXAMINATION: VITAL SIGNS: Blood pressure was 110/70, initial heart rate was 115 irregular, respiratory rate of 20.  HEENT: Normocephalic, atraumatic. Pupils equal and reactive to light.  NECK: Supple. Mild jugular venous distention.  LUNGS: Bilateral rhonchi, wheezing. Decreased air movement bilaterally, slight respiratory distress with use of accessory muscles.  HEART: Irregularly irregular. Systolic ejection murmur at the apex. PMI nondisplaced.  ABDOMEN: Benign.  EXTREMITIES: Within normal limits. NEUROLOGIC: Intact.  SKIN: Normal.   OTHER LABORATORIES: EKG: Atrial flutter, with AV block, rate of about 120. Follow up EKGs were much improved. CBC had neutrophils of 85%, lymphocytes of 5.4, potassium 3.1, BUN 27, creatinine 1.1. TSH 0.2. Troponin 0.031. White count 5, hemoglobin 15.   IMPRESSION:  1.  Acute on chronic respiratory failure, atrial fibrillation, chest pain, possible angina.  2.  Elevated troponin, possible mild heart failure. 3.  Hypertension. 4.  Gastroesophageal reflux disease.  5.  Hypothyroidism.   PLAN:  1.  Agree with  admit. Rule out for myocardial, infarction follow-up cardiac enzymes. Follow up troponins. Follow up EKG. Short-term anticoagulation. Chest  pain control. Consider echocardiogram, once patient's stabilizes respiratory wise can schedule her for study.   2.  Chronic obstructive pulmonary disease, respiratory failure. Continue inhalers, consider steroid therapy. Consider pulmonary consult. Would also consider supplemental oxygen therapy. Consider CT of the chest. Consider mild diuresis as well and see if the patient's respiratory status improves.  3.  Hypertension, continue hypertensive control as we have been doing.  4.  Thyroid disease. Continue supplemental thyroid therapy.  5.  For hypokalemia, will replace potassium.  6.  Continue gastrointestinal prophylaxis with Protonix.  7.  Continue deep vein thrombosis prophylaxis which we are doing with anticoagulation.  8. Would consider long-term rhythm control for atrial fibrillation as well as whether long-term anticoagulation would be helpful. She was not able to tolerate Pradaxa for unclear reasons. Will consider whether Coumadin or some other agent would be helpful. She appears to have a significant enough [place] score that long-term anticoagulation will be helpful, but rhythm control may also be of some benefit either with sotalol or amiodarone. I think her pulmonary status is her biggest problem and further workup in that regard is necessary, before definitive cardiac evaluation. I do not recommend cardiac catheterization at this point. We will continue to follow the patient, hopefully will be able to improve her to sinus and maybe we can consider antiarrhythmic drug trial.  We will continue to follow.      ____________________________ Loran Senters Clayborn Bigness, MD ddc:NTS D: 09/17/2013 03:10:35 ET T: 09/17/2013 03:57:38 ET JOB#: [id number]  cc: Tiandre Teall D. Clayborn Bigness, MD, <Dictator> Yolonda Kida MD ELECTRONICALLY SIGNED 10/20/2013 9:35

## 2015-01-09 NOTE — Consult Note (Signed)
PATIENT NAME:  Toni [name], Toni [name] MR#:  [id number] DATE OF BIRTH:  May 25, 1935  CARDIOLOGY CONSULTATION   DATE OF CONSULTATION:  09/27/2013  REFERRING PHYSICIAN:  Srikar R. Sudini, MD CONSULTING PHYSICIAN:  Isaias Cowman, MD  CHIEF COMPLAINT: Shortness of breath.   REASON FOR CONSULTATION: Consultation requested for evaluation of elevated troponin and atrial fibrillation.   HISTORY OF PRESENT ILLNESS: The patient is a 80 year old female with history of COPD, on home oxygen, and paroxysmal atrial fibrillation. The patient was recently hospitalized and discharged following COPD exacerbation, was discharged home on 2 liters of oxygen. The patient reports that she was in her usual state of health until yesterday, when she experienced a 30-minute episode of disorientation. She was seen by Dr. Raul Del, was noted to have wheezing on examination and hypoxia. The patient was sent to [facility] Emergency Room, where she was in atrial fibrillation, had a rate of 130. Treated with 10 mg of intravenous Cardizem. The patient was admitted to telemetry, where her heart rate has been controlled in the 60s to 80s. The patient denies chest pain. The patient has borderline elevated troponin of 0.16, already trending down to 0.07. She has an elevated white count of 15,600.   PAST MEDICAL HISTORY:  1. COPD.  2. Paroxysmal atrial fibrillation.  3. Right lung nodule.  4. History of chronic diastolic congestive heart failure.  5. Hypertension.  6. Hypothyroidism.   MEDICATIONS ON ADMISSION:  1. Cardizem 30 mg q.6.  2. Lasix 20 mg b.i.d. 3. Potassium chloride 10 mEq b.i.d. 4. Metoprolol succinate 25 mg daily.  5. Albuterol inhaler b.i.d. 6. Atrovent inhaler 2 puffs q.i.d. 7. Levothyroxine 75 mcg daily.  8. Loratadine 10 mg daily.  9. Symbicort 2 puffs b.i.d.   SOCIAL HISTORY: The patient is married, lives with her husband. Previously smoked 2 packs of cigarettes a day, quit 2 years ago.   FAMILY HISTORY: Notable  for coronary artery disease in her father.   REVIEW OF SYSTEMS:  CONSTITUTIONAL: No fever or chills.  EYES: No blurry vision.  EARS: No hearing loss.  RESPIRATORY: Shortness of breath, as described above.  CARDIOVASCULAR: The patient denies chest pain.  GASTROINTESTINAL: No nausea, vomiting or diarrhea.  GENITOURINARY: No dysuria or hematuria.  ENDOCRINE: No polyuria or polydipsia.  MUSCULOSKELETAL: No arthralgias or myalgias.  NEUROLOGICAL: No focal muscle weakness or numbness.  PSYCHOLOGICAL: No depression or anxiety.   PHYSICAL EXAMINATION:  VITAL SIGNS: Blood pressure 96/71, pulse 66, respirations 19, temperature 97.7, pulse oximetry 89%.  HEENT: Pupils equally reactive to light and accommodation.  NECK: Supple without thyromegaly.  LUNGS: Revealed diffuse end-expiratory wheezes.  CARDIOVASCULAR: Normal JVP. Normal PMI. Regular rate and rhythm. Normal S1, S2. No appreciable gallop, murmur or rub.  ABDOMEN: Soft and nontender. Pulses were intact bilaterally.  MUSCULOSKELETAL: Normal muscle tone.  NEUROLOGICAL: The patient is alert and oriented x3. Motor and sensory both grossly intact.   IMPRESSION: A 80 year old female with known end-stage chronic obstructive pulmonary disease, on chronic oxygen therapy, with recent exacerbation, returns with persistent hypoxia, wheezing as well as episodic rapid atrial fibrillation. The patient has borderline elevated troponin, which is likely due to demand supply ischemia, not due to acute coronary syndrome.   RECOMMENDATIONS:  1. Agree with overall current therapy.  2. Defer full-dose anticoagulation.  3. May consider up-titrate Cardizem pending the patient's clinical course.  4. Would defer further noninvasive or invasive cardiac evaluation at this time.   ____________________________ Isaias Cowman, MD ap:lb D: 09/27/2013 09:24:52 ET T: 09/27/2013  09:37:32 ET JOB#: [id number]  cc: Isaias Cowman, MD, <Dictator> Isaias Cowman MD ELECTRONICALLY SIGNED 11/04/2013 12:26

## 2015-01-09 NOTE — Discharge Summary (Signed)
PATIENT NAME:  Toni [name], Toni [name] MR#:  [id number] DATE OF BIRTH:  [birth year]  DATE OF ADMISSION:  09/26/2013 DATE OF DISCHARGE:  09/28/2013  DISCHARGE DIAGNOSES:  1.  Acute respiratory failure on chronic respiratory failure.  2.  Chronic obstructive pulmonary disease exacerbation.  3.  Atrial fibrillation with rapid ventricular rate.  4.  Elevated troponin secondary to demand ischemia.  5.  Chronic diastolic congestive heart failure.  6.  Hypokalemia.  7.  Lung nodule. Follow up with Dr. Raul Del.   IMAGING STUDIES DONE: Include a chest x-ray, which showed no acute abnormalities. It did show chronic changes.   CONSULTATIONS: Dr. Saralyn Pilar of cardiology.   ADMITTING HISTORY AND PHYSICAL AND HOSPITAL COURSE: Please see detailed H and P dictated previously. In brief, a 80 year old Caucasian female patient with history of COPD on 2 L home oxygen, who presented to the hospital complaining of shortness of breath and cough. The patient also had some palpitations and was found to have A. fib. with RVR. The patient was treated with nebulizers, IV steroids, antibiotics with which she improved well and by the day of discharge, the patient is saturating 94% on room air at rest. The patient does use 2 L home oxygen as needed, which will be continued. The patient will be on a prednisone taper and antibiotics for 5 more days and has been started on nystatin for oral thrush. She was given a dose of Cardizem in the Emergency Room for her A. fib. with RVR secondary to the acute respiratory failure. By the day discharge, the patient's heart rate in the 60s. Her metoprolol is being increased from 25 once a day to 25 twice a day. Anticoagulation with aspirin. She had a mild elevation in troponin for which Dr. Saralyn Pilar has seen the patient, thought to be secondary to demand ischemia from the A. fib and acute respiratory failure. No further cardiac work-up has been advised. Prior to discharge, the patient has mild wheezing  saturating 96% on room air.   DISCHARGE MEDICATIONS: Include:  1.  Levothyroxine 75 mcg once a day. 2.  singulair10 mg oral once a day.  3.  Symbicort 160/4.5, 2 puffs inhaled 2 times a day.  4.  Potassium chloride 10 mEq 2 tablets oral once a day.  5.  Lasix 40 mg 1/2 tablet daily.  6.  Proventil HFA 2 puffs inhaled every 4 hours as needed.  7.  Atrovent 2 puffs inhaled 4 times a day.  8.  Albuterol nebulizer 3 mL nebulized 2 times a day.  9.  Spiriva 18 mcg inhaled once a day.  10. Perforomist 2 mL inhaled once a day.  11. Nystatin 100,000 units per mL, 5 mg oral every 6 hours.  12. Levaquin 750 mg oral once a day for 5 days.  13. Metoprolol succinate 25 mg oral twice a day.  14. Aspirin 81 mg daily.  15. Simvastatin 40 mg daily.  16. Prednisone 60 mg tapered x 10 mg daily for 6 days.   DISCHARGE INSTRUCTIONS: The patient will continue her home oxygen at 1 to 2 L as needed on ambulation. Low-sodium diet. Activity as tolerated. Follow up with primary care physician in 1 to 2 weeks and Dr. Saralyn Pilar with cardiology for her atrial fibrillation. She does follow up with Dr. Raul Del of pulmonology for her lung nodules.   TIME SPENT ON DAY OF DISCHARGE IN DISCHARGE ACTIVITY: 38 minutes.  __________________________ Leia Alf Makeyla Govan, MD srs:aw D: 09/28/2013 13:05:49 ET T: 09/29/2013 06:24:42 ET JOB#:  [id number]  cc: Iliya Spivack R. Massiel Stipp, MD, <Dictator> DR. FLEMING Isaias Cowman, MD Neita Carp MD ELECTRONICALLY SIGNED 10/07/2013 18:12

## 2015-01-09 NOTE — H&P (Signed)
PATIENT NAME:  Toni [name], Toni [name] MR#:  [id number] DATE OF BIRTH:  1934/11/04  DATE OF ADMISSION:  03/12/2014  REFERRING PHYSICIAN:  Dr. Harvest Dark   PULMONARY: Dr. Lake Bells   PRIMARY CARE PHYSICIAN: Dr. Crista Luria   CHIEF COMPLAINT: Cough, shortness of breath, chills.   HISTORY OF PRESENT ILLNESS: This is a 80 year old female with known history of chronic obstructive pulmonary disease on 2 liters nasal cannula at baseline, with pulmonary nodules who has been followed by pulmonary as an outpatient. Complains of paroxysmal atrial fibrillation on Xarelto.  Patient presents today with complaints of cough with productive green sputum for the last 3 days, as well as complaints of shortness of breath and having some chills.  She had significant leukocytosis at 16.9.  She had chest x-ray which did show left lung base infiltration. The patient was started on levofloxacin in the ED for community-acquired pneumonia. Blood cultures were sent. The patient is known to have history of pulmonary nodules.   She has been followed regularly with pulmonary service, Dr. Lake Bells as an outpatient. Hospitalist requested to admit the patient. Actually, the patient did have low-grade temperature at 99.5.  PAST MEDICAL HISTORY:  1. Paroxysmal atrial fibrillation.  2. Chronic obstructive pulmonary disease.  3. Chronic respiratory failure on 2 liters oxygen.  4. Right lung nodules.  5. Chronic systolic congestive heart failure.  6. Hypertension.  7. Hypothyroidism.  8. Remote tobacco use.   PAST SURGICAL HISTORY: Total knee replacement.   ALLERGIES: PRADAXA AND BELLADONNA.   FAMILY HISTORY:  Coronary artery disease in her father.   SOCIAL HISTORY: The patient is married, lives at home. Used to smoke. He quit 2 years ago. No alcohol. No illicit drugs.   HOME MEDICATIONS:  1. Xarelto 20 mg oral in the evening.  2. Budesonide inhalational daily.  3. Norco 5/325 once every 4 hours as needed.  4. Diltiazem  extended release 300 mg daily.  5. Flecainide 100 mg oral 2 times a day.  6. Loratadine 10 mg oral daily.  7. DuoNebs as needed.  8. Perforomist 20 mg inhalational at bedtime.  9. Spiriva 18 mcg inhalational daily.  10. Proventil as needed.  11. Lasix 20 mg oral daily.  13. K-Dur 10 mEq 3 tablets oral daily.  14. Levothyroxine 75 mcg oral daily.   REVIEW OF SYSTEMS:  CONSTITUTIONAL: Reports chills, fatigue, weakness.  EYES: Denies blurry vision, double vision, inflammation.  EARS, NOSE, AND THROAT:  Denies tinnitus, ear pain, hearing loss, epistaxis.  RESPIRATORY: Reports cough, productive sputum, shortness of breath, and COPD.  CARDIOVASCULAR: Denies chest pain, edema, palpitations, syncope.  GASTROINTESTINAL: Denies nausea, vomiting, diarrhea, abdominal pain, hematemesis.  GENITOURINARY: Denies dysuria, hematuria, renal colic.  ENDOCRINE: Denies polyuria, polydipsia, heat or cold intolerance.  HEMATOLOGY: Denies anemia, easy bruising, bleeding diathesis.  INTEGUMENT: Denies acne, rash or skin lesion.  MUSCULOSKELETAL: Denies any gout, arthritis, cramps.  NEUROLOGIC: Denies CVA, TIA, tremors, vertigo, ataxia.  PSYCHIATRIC: Reports history of anxiety. Denies insomnia or depression.   PHYSICAL EXAMINATION:  VITAL SIGNS: Temperature 99.5, pulse 80, respiratory rate 28, blood pressure 114/60, saturating 100% on oxygen.  GENERAL: Frail, elderly female who looks comfortable in bed, in no apparent distress.  HEENT: Head atraumatic, normocephalic. Pupils equal, reactive to light. Pink conjunctivae. Anicteric sclerae. Dry oral mucosa.  NECK: Supple. No thyromegaly. No JVD.  No carotid bruits. CHEST: Had fair air entry bilaterally with scattered rales and rhonchi.  CARDIOVASCULAR: S1, S2 heard. No rubs, murmurs or gallops.  ABDOMEN: Soft,  nontender, nondistended. Bowel sounds present.  EXTREMITIES: No edema. No clubbing. No cyanosis. Pedal and radial pulses felt bilaterally.  PSYCHIATRIC:  Appropriate affect. Awake, alert x 3. Intact judgment and insight.  NEUROLOGIC: Cranial nerves grossly intact. Motor 5/5. No focal deficits.  SKIN: Normal skin turgor. Warm and dry.  MUSCULOSKELETAL: No joint effusion or erythema.   PERTINENT LABORATORY DATA: Glucose 110, BUN 27, creatinine 1.14, sodium 138, potassium 3.5, chloride 102, CO2 of 30. Troponin less than 0.02. White blood cell 16.9, hemoglobin 9.6, hematocrit 31.4, platelets 276,000.  IMAGING DATA:  Chest x-ray showing diffuse, fine interstitial changes compatible with chronic bronchitis, a small area of infiltration of atelectasis in the left lung base.   ASSESSMENT AND PLAN:  1. Sepsis. Patient presents with tachypnea, fever, and leukocytosis. This is most likely related to community-acquired pneumonia.  2. Pneumonia. The patient will be treated on levofloxacin for community-acquired pneumonia. We will follow on the blood culture and adjust antibiotics if needed.  3. History of chronic obstructive pulmonary disease. The patient has no active wheezing. We will continue her on her home medication including DuoNebs, p.r.n. albuterol, Perforomist, budesonide, and Spiriva.  4. History of atrial fibrillation, rate controlled. Continue with flecainide and Cardizem and take Xarelto for anticoagulation.  5. Chronic respiratory failure at baseline. Continue with 2 liters nasal cannula.  6. Lung nodules . This has been followed as an outpatient by pulmonary, Dr. Lake Bells.  7. Chronic diastolic congestive heart failure. Appears to be compensated. We will hold Lasix until she is more stable.  8. Hypertension. Blood pressure acceptable. Continue with home medications.  9. Hypothyroidism. Continue with Synthroid.  10. Deep vein thrombosis prophylaxis. The patient is on full anticoagulation with Xarelto. We will continue with sequential compression device.  CODE STATUS: Full code.   TOTAL TIME SPENT ON ADMISSION AND PATIENT CARE: 55  minutes.   ____________________________ Albertine Patricia, MD dse:dd D: 03/13/2014 00:25:13 ET T: 03/13/2014 01:55:14 ET JOB#: [id number]  cc: Albertine Patricia, MD, <Dictator> DAWOOD Graciela Husbands MD ELECTRONICALLY SIGNED 03/13/2014 20:33

## 2015-01-09 NOTE — Consult Note (Signed)
Reason for Visit: This 80 year old Female patient presents to the clinic for and possible treatment a right upper lobe squamous call carcinoma.   Referred by Dr. Oliva Bustard.  Diagnosis:  Chief Complaint/Diagnosis   Cancer of the lung   HPI   Toni [name] is a 80 year old white female whom I was asked to see in consultation by Dr. Oliva Bustard The patient is accompanied by her husband and 3 other family members.December 2014 the patient developed pneumonia and had a CT scan. Abnormalities were seen in her lungs and she was treated for pneumonia and had a repeat CT scan performed. Repeat CT scan showed that the right upper lobe nodule had increased in size in the especially in the soft tissue component. She underwent a needle biopsy of the right upper lobe cavitary mass on July 2015 and this revealed a squamous cell carcinoma. The patient does smoke cigarettes 1-1/2 packs per day for 35 years and has extensive chronic obstructive pulmonary disease. Her lung function is compromised and has an FEV1 of 0.97 L which is 52% of the predicted value. She is unable to walk any long distances and is utilizing a wheelchair for ambulation. The patient has a dry hacking cough and uses oxygen during the night and often during the day. She has not had any hemoptysis. The patient saw Dr. Tyler Deis and he is referred her for evaluation regarding her radiation. He does not plan any chemotherapy. Because of the small size of the lesion the patient's comorbidities and the peripheral location the patient is being considered for stereotactic radiosurgery.  Past Hx:    COPD:    Hypertension:    Hypothyroidism:    Atrial Fibrillation:   Past, Family and Social History:  Additional Past Medical and Surgical History Past medical history reveals that the patient has chronic obstructive pulmonary disease. Frequent bouts with pneumonia. Bilateral knee replacements. And atrial fibrillation.  Family history is noncontributory  Social  history the patient has several children and is married and lives with her husband. She does not drink alcoholic beverages and is smoked one and a half packs of cigarettes per day for more than 30 years.   Allergies:   belladonna: Other  Home Meds:  Home Medications: Medication Instructions Status  DuoNeb 2.5 mg-0.5 mg/3 mL inhalation solution 3 milliliter(s) inhaled every 4 hours, As Needed - for Shortness of Breath Active  nystatin 100,000 units/mL oral suspension 10 milliliter(s) orally every 4 hours, As Needed, mild pain , As needed, mild pain Active  budesonide-formoterol 160 mcg-4.5 mcg/inh inhalation aerosol 2  inhaled 2 times a day Active  guaiFENesin 600 mg oral tablet, extended release 1 tab(s) orally 2 times a day Active  chlorpheniramine-HYDROcodone 8 mg-10 mg/5 mL oral suspension, extended release 5 milliliter(s) orally every 12 hours, As Needed, cough , As needed, cough Active  Xarelto 20 mg oral tablet 1 tab(s) orally once a day (in the evening) Active  flecainide 100 mg oral tablet 1 tab(s) orally 2 times a day Active  Diltiazem Hydrochloride ER 300 mg/24 hours oral capsule, extended release 1 cap(s) orally once a day Active  Proventil HFA CFC free 90 mcg/inh inhalation aerosol 1 to 2 puff(s) inhaled every 4 hours, As Needed - for Shortness of Breath Active  Lasix 20 mg oral tablet 1 tab(s) orally once a day Active  Spiriva 18 mcg inhalation capsule 1 each inhaled once a day Active  K-Dur 10 3 tab(s) orally once a day Active  PriLOSEC OTC 20 mg  oral delayed release tablet 1 tab(s) orally once a day Active  Tessalon Perles 100 mg oral capsule 1 cap(s) orally 2 times a day Active  levothyroxine 75 mcg (0.075 mg) oral tablet 1 tab(s) orally once a day Active  loratadine 10 mg oral tablet 1 tab(s) orally once a day Active  Perforomist 20 mcg/2 mL inhalation solution 2 milliliter(s) inhaled once a day (at bedtime)  Active   Review of Systems:  General negative   Review of  Systems   Review of systems Gen. weakness fatigue and shortness of breath lungs cough shortness of breath musculoskeletal Gen. aches and pains all other systems were reviewed with the patient and found to be negative.  Nursing Notes:  Nursing Vital Signs and Chemo Nursing Nursing Notes: *CC Vital Signs Flowsheet:   21-Jul-15 15:05  Temp Temperature 97.7  Pulse Pulse 82  Respirations Respirations 18  SBP SBP 108  DBP DBP 69  Pain Scale (0-10)  0  Height (cm) centimeters 161   Physical Exam:  MS/Neuro/Psych/Lymph:  Physical Exam Physical examination reveals a well-developed well-nourished female sitting in a wheelchair. She is oriented to time place and for personal and answers questions appropriately. HEENT extraocular movements intact Neck no cervical or supraclavicular adenopathy Chest reveals decreased breath sounds bilaterally coarse rhonchi in both lungs Heart reveals irregular heart sounds no murmur Breasts not examined Abdomen soft nontender no organomegaly Musculoskeletal no joint swelling or edema Skin no rashes Neurological cranial nerves II through XII are intact good strength in upper and lower extremity   Relevent Results:   Relevant Scans and Labs CT of the chest done on 03/10/2014 and compared with CT scans of 09/15/2013 and 4030 2015 shows a cavitary nodule in the right upper lobe that has slightly increased in size from previous scans.   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 I explained to her that she could be treated using conventional radiation which would consist of approximately 7000 cGy  over a 7 week period of time but another alternative would be stereotactic radiation therapy.think the patient would be afor stereotactic radiation therapy depending on the findings at the time of her planning CT. The lesion is peripheral and quite small and with the patient's comorbidities I think that she could tolerate stereotactic radiation therapy very well. The final decision regarding this treatment will be given by Dr. Donella Stade upon his return. The patient is scheduled to undergo a planning CT within the next 10 days with Dr. Donella Stade  present. The patient and family were agreeable to this approach and she will return as indicated.   Electronic Signatures: Rosealee Albee (MD)  (Signed 23-Jul-15 14:31)  Authored: HPI, Diagnosis, Past Hx, PFSH, Allergies, Home Meds, ROS, Nursing Notes, Physical Exam, Relevent Results, Encounter Assessment and Plan   Last Updated: 23-Jul-15 14:31 by Rosealee Albee (MD)

## 2015-01-09 NOTE — Consult Note (Signed)
Brief Consult Note: Diagnosis: Borderline elevated troponin, likely demand supply, PAF, currently rate controlled.   Patient was seen by consultant.   Consult note dictated.   Comments: REC  Agree with current therapy, defer full dsoe anticoagulation, cont cardizem and metoprolol, uptitrate if neccesary, defer further cardiac diagnostics at this time.  Electronic Signatures: Isaias Cowman (MD)  (Signed 10-Jan-15 09:26)  Authored: Brief Consult Note   Last Updated: 10-Jan-15 09:26 by Isaias Cowman (MD)

## 2015-01-09 NOTE — Discharge Summary (Signed)
PATIENT NAME:  Toni [name], Toni [name] MR#:  [id number] DATE OF BIRTH:  [birth year]  DATE OF ADMISSION:  09/12/2013 DATE OF DISCHARGE:  09/17/2013  DISCHARGE DIAGNOSIS:      ____________________________ Epifanio Lesches, MD sk:np D: 09/18/2013 13:49:09 ET T: 09/18/2013 15:07:06 ET JOB#: [id number]  cc: Epifanio Lesches, MD, <Dictator> Epifanio Lesches MD ELECTRONICALLY SIGNED 10/03/2013 15:00

## 2015-01-09 NOTE — H&P (Signed)
PATIENT NAME:  Toni [name], Toni [name] MR#:  [id number] DATE OF BIRTH:  19-Jul-1935  DATE OF ADMISSION:  09/26/2013  CHIEF COMPLAINT: Shortness of breath, cough.   HISTORY OF PRESENTING ILLNESS: This is a 80 year old Caucasian female patient with a history of COPD on 2 liters home oxygen, paroxysmal atrial fibrillation, and recently diagnosed lung nodules, who presents to the Emergency Room complaining of 1 day of worsening shortness of breath. The patient felt fine until yesterday.   Today she went to see Dr. Raul Del, her pulmonologist, and was sent to the Emergency Room secondary to worsening trouble breathing and COPD exacerbation. The patient is wheezing diffusely, is on 5 liters oxygen to keep her sats over 92%. Also had atrial fibrillation with rapid ventricular rate into the 130s in the Emergency Room. Had to be given 10 mg of IV Cardizem 1 time.   The patient was in the hospital recently for some chest pain, COPD exacerbation, in December 2014. Was discharged home on 2 liters home oxygen. She mentions that she has been compliant with her inhalers, used a nebulizer machine. She smoked many years back but has quit since. No sick contacts. Did receive an influenza vaccine earlier this season.   PAST MEDICAL HISTORY:  1.  History of paroxysmal atrial fibrillation.  2.  COPD.  3.  Chronic respiratory failure on 2 liters oxygen.  4.  Right lung nodule.  5.  Chronic diastolic CHF.  6.  Hypertension.  7.  Hypothyroidism.  8.  Remote tobacco use.   PAST SURGICAL HISTORY: Total knee replacement.   ALLERGIES: PRADAXA.   FAMILY HISTORY: Coronary artery disease in her father.   SOCIAL HISTORY: The patient is married, lives with her husband. Used to smoke a pack a day but quit 2 years back. No alcohol. No illicit drugs.   CODE STATUS: FULL CODE.   REVIEW OF SYSTEMS: CONSTITUTIONAL: Complains of fatigue, weakness, and weight loss of about 10 pounds over the past few months.  EYES: No blurred vision,  pain, redness.  ENT: No tinnitus, ear pain, hearing loss.  RESPIRATORY: Has cough, wheeze but no sputum. Has COPD.  CARDIOVASCULAR: No chest pain, orthopnea, edema.  GASTROINTESTINAL: No nausea, vomiting, diarrhea, abdominal pain. Has had constipation.  GENITOURINARY: Denies dysuria, hematuria, or frequency.  ENDOCRINE: No polyuria, nocturia. Does have hypothyroidism.  HEMOLYMPHATIC: No anemia, easy bruising, bleeding.  INTEGUMENTARY: No acne, rash, lesions.  MUSCULOSKELETAL: Has some back pain. No arthritis. No joint swelling.  PSYCHIATRIC: No anxiety or depression.  NEUROLOGIC: No focal numbness, weakness, seizures.   MEDICATIONS: Includes:  1.  Albuterol inhaler 2 times a day.  2.  Atrovent HFA 2 puffs inhaled 4 times a day.  3.  Cardizem 30 mg oral every 6 hours.  4.  Lasix 20 mg two times a day.  5.  Potassium chloride10 mEq two tablets once a day.  6.  Levothyroxine 75 mcg oral once a day.  7.  Loratadine 10 mg oral once a day.  8.  Metoprolol succinate 25 mg extended release once a day.   9.  Symbicort 160/4.5, two puffs inhaled 2 times a day.   She finished her prednisone 2 days back.   PHYSICAL EXAMINATION:  VITAL SIGNS: Temperature 97.8, pulse of 136, respirations 20, blood pressure 105/63.  GENERAL: Elderly, frail Caucasian female patient lying in bed in significant respiratory distress using accessory muscles.  PSYCHIATRIC: Alert and oriented x3, anxious.  HEENT: Atraumatic, normocephalic. Oral mucosa moist and pink. External ears and nose normal.  No pallor. No icterus. Pupils bilaterally equal and reactive to light.  NECK: Supple. No thyromegaly or palpable lymph nodes. Trachea midline. No carotid bruit, JVD.  CARDIOVASCULAR: S1, S2, irregular, tachycardic without any murmurs.  RESPIRATORY: Bilateral wheezing, decreased air entry, using accessory muscles, 1-to-2 word conversational dyspnea.  GASTROINTESTINAL: Soft abdomen, nontender. Bowel sounds present. No  hepatosplenomegaly palpable.   GENITOURINARY: No CVA tenderness or bladder distention.  SKIN: Warm and dry. No petechiae, rash, ulcers.  MUSCULOSKELETAL: No joint swelling, redness, effusion of the large joints. Normal muscle tone.  NEUROLOGICAL: Motor strength five out of five in upper and lower extremities. Sensation is intact all over.  LYMPHATIC: No cervical lymphadenopathy.   LABORATORY STUDIES:  BNP of 3323. Glucose of 106, BUN 46, creatinine 1.14, Ast/Alt, alkaline phosphatase, bilirubin normal. Troponin 0.16. WBCs 15.6, hemoglobin 15.2, platelets of 285.   EKG shows atrial fibrillation with rapid ventricular rate. No ST elevation. Does have some ST depressions in the inferior leads.   Chest x-ray shows no acute abnormalities.   ASSESSMENT AND PLAN:  1.  Acute-on-chronic respiratory failure secondary to chronic obstructive pulmonary disease exacerbation. Will check an influenza test, started on IV steroids along with scheduled nebulizers. Continue her home inhalers. We will add Spiriva inhaled daily. The patient is on 5 liters of oxygen at this time. Is at high risk for deterioration and intubation. Discussed with the patient about her critical illness and she wishes to be full code and  intubation if needed.  2.  Atrial fibrillation with rapid ventricular rate secondary to the respiratory failure and chronic obstructive pulmonary disease exacerbation. The patient has received a dose of Cardizem. Presently heart rate is improved. We will continue her Cardizem and metoprolol from home and use intravenous metoprolol p.r.n. if heart rate is more than 125.  3.  Elevated troponin of 0.16, likely secondary from demand ischemia with chronic obstructive pulmonary exacerbation and the atrial fibrillation. We will consult cardiology for further input with this. Continue the aspirin and start patient on a statin. The patient is on a beta blocker.  4.  Chronic kidney disease, III, is stable.  5.   Leukocytosis. The patient has been on prednisone since her discharge and finished her the prednisone just 2 days back.  6.  Lung nodules. Is following with Dr. Raul Del as outpatient and this is being monitored with imaging studies.  7.  Deep vein thrombosis prophylaxis with Lovenox.   CODE STATUS: FULL CODE.   TOTAL CRITICAL CARE TIME SPENT: 50 minutes.   ____________________________ Leia Alf Chevette Fee, MD srs:np D: 09/26/2013 19:18:13 ET T: 09/26/2013 19:51:54 ET JOB#: [id number]  cc: Alveta Heimlich R. Naureen Benton, MD, <Dictator> Herbon E. Raul Del, MD Dwayne D. Clayborn Bigness, MD Neita Carp MD ELECTRONICALLY SIGNED 10/07/2013 18:12

## 2015-01-09 NOTE — Discharge Summary (Signed)
PATIENT NAME:  Toni [name], Toni [name] MR#:  [id number] DATE OF BIRTH:  Jun 01, 1935  DATE OF ADMISSION:  09/12/2013 DATE OF DISCHARGE:  09/17/2013  DISCHARGE DIAGNOSES:  1. Chronic obstructive pulmonary disease exacerbation.  2. Right lung nodule with PET scan. The patient is to follow up with Dr. Raul Del for biopsy. 3. Tachycardia.  4. Acute on chronic diastolic heart failure.  DISCHARGE MEDICATIONS: 1. Oxygen 2 liters via nasal cannula.  2. Levothyroxine 75 mcg p.o. daily.  3. KCl 10 mEq p.o. b.i.d.  4. Loratadine 10 mg p.o. daily.  5. Metoprolol succinate 25 mg p.o. daily. 6. Symbicort 160/4.5 two puffs b.i.d.  7. Atrovent 2 puffs 4 times daily.  8. Furosemide 40 mg p.o. b.i.d.  9. Cardizem 30 mg every 6 hours.  10. Levaquin 1 tablet 750 mg every 48 hours for 2 tablets.  11. Prednisone 20 mg strength 3 tablets daily for 2 days, 2 tablets daily for 2 days and 1 tablet daily for 2 days and then stop.   CONSULTATION: Herbon E. Raul Del, MD  DIET: Low-sodium, low-fat diet.   HOSPITAL COURSE: This is a 80 year old female patient admitted because of shortness of breath, chest pain and chest tightness. She is transferred from [facility] because of nonavailability of cardiologist, and the patient had chest pain. Dr. Laurin Coder admitted the patient, so look at the history and physical for full details. The patient initially was started on heparin drip, continued on aspirin and nitroglycerin for chest pain, and she was monitored on telemetry. The patient's troponins have been negative, and the patient's heparin drip was stopped. Her echo showed EF more than 55%.   Her chest pain and trouble breathing thought to be secondary to COPD exacerbation. She had a lot of wheezing, so we started her on Solu-Medrol, DuoNebs along with antibiotics. The patient clinically improved, but she continued to require oxygen. The patient's O2 saturations were like in 90s and also dropped to 87 sometimes, so we did a  CT of the chest to evaluate for pulmonary emboli. The patient's CT chest did not show any PE, but it did show lung nodules in the right lung, in the right lower lobe and right upper lobe, concerning for malignancy. So, we asked Dr. Raul Del to see the patient. The patient had a PET scan. PET scan showed no hypermetabolic lymph nodes in the neck, no mediastinal lymphadenopathy. Lower lobe lesion adjacent to aorta does not demonstrate any uptake. The patient has no abdominal uptake in the liver, pancreas or adrenals. No skeletal metastasis. She has a right upper lobe which is weakly FDG positive, concerning for neoplasm, which was 15 mm cavitary lesion, and she also has 2 left lower lobe lesions. The patient's condition and the PET scan results with Dr. Raul Del. He recommended to follow up with him next week regarding arranging the biopsy of the lung, and with the patient, the same is discussed. We checked the O2 saturations on exertion. The patient dropped to 88% on ambulation, so we discharged her with home oxygen.  The patient has tachycardia up to 130s, so we have added Cardizem, and that helped her with decreasing the heart rate and controlling the heart rate.   The patient did have some anxiety and asked for Xanax. I wrote for Xanax 0.5 mg b.i.d. for 30 tablets. The patient has involved home care to help with oxygen requirements, and she will see Dr. Raul Del next week regarding biopsy. This is discussed with the patient and patient's daughter-in-law in  detail. The patient did not have any further cardiac workup. She also was seen by Dr. Clayborn Bigness, who recommended pulmonary workup. The patient's shortness of breath thought to be secondary to her COPD rather than angina.   TIME SPENT ON DISCHARGE PREPARATION: More than 30 minutes.   ____________________________ Epifanio Lesches, MD sk:lb D: 09/18/2013 13:54:10 ET T: 09/18/2013 14:59:38 ET JOB#: [id number]  cc: Epifanio Lesches, MD,  <Dictator> Epifanio Lesches MD ELECTRONICALLY SIGNED 10/03/2013 15:00

## 2015-03-09 ENCOUNTER — Telehealth: Payer: Self-pay | Admitting: Pulmonary Disease

## 2015-03-09 NOTE — Telephone Encounter (Signed)
Entered in error

## 2015-03-09 NOTE — Telephone Encounter (Signed)
-----   Message from Juanito Doom, MD sent at 03/17/2014 11:56 PM EDT ----- CT chest, call rob, [facility]?

## 2015-05-17 ENCOUNTER — Encounter: Payer: Self-pay | Admitting: Cardiovascular Disease

## 2015-05-17 ENCOUNTER — Ambulatory Visit (INDEPENDENT_AMBULATORY_CARE_PROVIDER_SITE_OTHER): Payer: Medicare Other | Admitting: Cardiovascular Disease

## 2015-05-17 VITALS — BP 132/90 | HR 73 | Ht 63.0 in | Wt 176.0 lb

## 2015-05-17 DIAGNOSIS — R600 Localized edema: Secondary | ICD-10-CM | POA: Diagnosis not present

## 2015-05-17 DIAGNOSIS — E785 Hyperlipidemia, unspecified: Secondary | ICD-10-CM | POA: Diagnosis not present

## 2015-05-17 DIAGNOSIS — I484 Atypical atrial flutter: Secondary | ICD-10-CM

## 2015-05-17 DIAGNOSIS — I5032 Chronic diastolic (congestive) heart failure: Secondary | ICD-10-CM | POA: Diagnosis not present

## 2015-05-17 DIAGNOSIS — J432 Centrilobular emphysema: Secondary | ICD-10-CM

## 2015-05-17 MED ORDER — POTASSIUM CHLORIDE CRYS ER 10 MEQ PO TBCR: 10.0000 meq | EXTENDED_RELEASE_TABLET | Freq: Two times a day (BID) | ORAL | Status: DC | PRN

## 2015-05-17 MED ORDER — RIVAROXABAN 20 MG PO TABS: 20.0000 mg | ORAL_TABLET | Freq: Every day | ORAL | Status: DC

## 2015-05-17 MED ORDER — FUROSEMIDE 40 MG PO TABS: 40.0000 mg | ORAL_TABLET | Freq: Two times a day (BID) | ORAL | Status: DC | PRN

## 2015-05-17 NOTE — Assessment & Plan Note (Signed)
Recently in the emergency room near where she lives for IV Lasix. Weight at home 174 pounds We have recommended if weight goes up to 178 pounds that she take extra Lasix after lunch with potassium

## 2015-05-17 NOTE — Assessment & Plan Note (Signed)
Lower extremity edema likely from venous insufficiency Unable to exclude very small component of chronic diastolic CHF. Recommended she stay on her Lasix daily. Could take extra Lasix after lunch as needed for weight gain

## 2015-05-17 NOTE — Assessment & Plan Note (Signed)
Heart rate well controlled. She has only been taking pradaxa once a day and prefers to take a medication once a day not twice a day. We will change her to Xarelto 20 mg daily at her request

## 2015-05-17 NOTE — Progress Notes (Signed)
Patient ID: Toni [name], female    DOB: Sep 02, 1935, 80 y.o.   MRN: [id number]  HPI Comments: Toni [name] is a 80 yo woman with long smoking hx, COPD, hyperlipidemia, previous hospitalization on 11/15/2013 for shortness of breath. history of paroxysmal atrial fibrillation,  Hospitalization at [facility] 09/26/2013 for COPD exacerbation, atrial fibrillation. Noted to be in atrial flutter 09/12/2013 EKGs at the end of February 2015, beginning of March 2015 showing atrial flutter She presents today for routine follow-up of her atrial fibrillation/flutter She has completed radiation and chemotherapy for squamous cell lung cancer  In follow-up today, she reports that she is doing well. She continues to work part-time. She reports that she was recently in the emergency room near where she lives for shortness of breath, given Lasix IV with improvement of her symptoms. She denies eating out for a much, denies excessive fluid intake. She has been taking her Lasix 40 mg daily. She takes this with potassium. Denies any leg edema but does have a vein problem in her legs Her weight at home is 174 pounds. Denies any significant shortness of breath or chest pain. She is active, no regular exercise program. No recent episodes of COPD exacerbation Tolerating anticoagulation  EKG on today's visit shows narrow complex rhythm most consistent with atrial flutter most likely with rate 73 bpm, nonspecific ST abnormality  Other past medical history Previous echocardiogram documenting mild aortic valve stenosis Previous lab work, Total cholesterol 185   bronchitis in February 2015  2/28 - admitted with Afib RVR started on diltiazem drip  treated with prednisone, Levaquin, nebulizers for COPD exacerbation 3/3 - placed on bipap 3/7 - started on flecainide for A flutter, started on anticoagulation   CT scan in December 2014 identified a lung nodule. Additional workup including biopsy has confirmed squamous cell lung  cancer.  radiation starting  August 2015.   stopped smoking 10-12 years ago. Previous Total cholesterol 220, LDL 155. She does not want a cholesterol medication echocardiogram in the hospital last month showed ejection fraction 55-60%, moderately dilated left atrium and right atrium, mild aortic valve stenosis        Allergies  Allergen Reactions  . Belladonna Alkaloids     "made eyes cross"     Outpatient Encounter Prescriptions as of 05/17/2015  Medication Sig  . albuterol (PROVENTIL HFA;VENTOLIN HFA) 108 (90 BASE) MCG/ACT inhaler Inhale 1-2 puffs into the lungs every 6 (six) hours as needed for wheezing or shortness of breath.  . budesonide (PULMICORT) 0.5 MG/2ML nebulizer solution Take 2 mLs (0.5 mg total) by nebulization 2 (two) times daily.  Marland Kitchen diltiazem (TIAZAC) 300 MG 24 hr capsule Take 1 capsule (300 mg total) by mouth daily.  . flecainide (TAMBOCOR) 100 MG tablet Take 1 tablet (100 mg total) by mouth 2 (two) times daily.  . formoterol (PERFOROMIST) 20 MCG/2ML nebulizer solution Take 20 mcg by nebulization 2 (two) times daily.  . furosemide (LASIX) 40 MG tablet Take 1 tablet (40 mg total) by mouth 2 (two) times daily as needed.  Marland Kitchen ipratropium-albuterol (DUONEB) 0.5-2.5 (3) MG/3ML SOLN Take 3 mLs by nebulization daily as needed.  Marland Kitchen levothyroxine (SYNTHROID, LEVOTHROID) 75 MCG tablet Take 75 mcg by mouth daily before breakfast.  . loratadine (CLARITIN) 10 MG tablet Take 1 tablet (10 mg total) by mouth daily.  . Omega-3 Fatty Acids (FISH OIL) 1000 MG CAPS Take 1,000 mg by mouth daily.  Marland Kitchen omeprazole (PRILOSEC) 20 MG capsule Take 20 mg by mouth daily.  Marland Kitchen  potassium chloride (K-DUR,KLOR-CON) 10 MEQ tablet Take 1 tablet (10 mEq total) by mouth 2 (two) times daily as needed.  . [DISCONTINUED] dabigatran (PRADAXA) 150 MG CAPS capsule Take 150 mg by mouth daily.   . [DISCONTINUED] furosemide (LASIX) 40 MG tablet Take 40 mg by mouth daily.   . [DISCONTINUED] potassium chloride  (K-DUR,KLOR-CON) 10 MEQ tablet Take 10 mEq by mouth daily.   . rivaroxaban (XARELTO) 20 MG TABS tablet Take 1 tablet (20 mg total) by mouth daily with supper.   No facility-administered encounter medications on file as of 05/17/2015.    Past Medical History  Diagnosis Date  . Cataract   . COPD (chronic obstructive pulmonary disease)   . Essential hypertension, benign   . Hypothyroidism   . Atypical atrial flutter 11/16/2013  . Arrhythmia   . A-fib   . Chronic diastolic heart failure   . Hypokalemia   . Lung nodule     Followed by Dr. Raul Del  . 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 Lung cancer     Past Surgical History  Procedure Laterality Date  . Eye surgery    . Total knee arthroplasty Bilateral     Social History  reports that she quit smoking about 12 years ago. Her smoking use included Cigarettes. She has a 75 pack-year smoking history. She has never used smokeless tobacco. She reports that she does not drink alcohol or use illicit drugs.  Family History family history includes Arrhythmia in her sister; CAD in her father; Heart attack in her father; Heart disease in her father.   Review of Systems  Constitutional: Negative.   Respiratory: Negative.   Cardiovascular: Negative.   Gastrointestinal: Negative.   Musculoskeletal: Negative.   Skin: Negative.   Neurological: Negative.   Hematological: Negative.   Psychiatric/Behavioral: Negative.   All other systems reviewed and are negative.   BP 132/90 mmHg  Pulse 73  Ht 5' 3"  (1.6 m)  Wt 176 lb (79.833 kg)  BMI 31.18 kg/m2  Physical Exam  Constitutional: She is oriented to person, place, and time. She appears well-developed and well-nourished.  HENT:  Head: Normocephalic.  Nose: Nose normal.  Mouth/Throat: Oropharynx is clear and moist.  Eyes: Conjunctivae are normal. Pupils are equal, round, and reactive to light.  Neck: Normal range of motion. Neck supple. No JVD present.   Cardiovascular: Normal rate, regular rhythm, S1 normal, S2 normal and intact distal pulses.  Exam reveals no gallop and no friction rub.   Murmur heard.  Systolic murmur is present with a grade of 2/6  Trace pitting edema to the midshin bilaterally  Pulmonary/Chest: Effort normal. No respiratory distress. She has decreased breath sounds. She has no wheezes. She has rales. She exhibits no tenderness.  Abdominal: Soft. Bowel sounds are normal. She exhibits no distension. There is no tenderness.  Musculoskeletal: Normal range of motion. She exhibits no edema or tenderness.  Lymphadenopathy:    She has no cervical adenopathy.  Neurological: She is alert and oriented to person, place, and time. Coordination normal.  Skin: Skin is warm and dry. No rash noted. No erythema.  Psychiatric: She has a normal mood and affect. Her behavior is normal. Judgment and thought content normal.    Assessment and Plan  Nursing note and vitals reviewed.

## 2015-05-17 NOTE — Patient Instructions (Addendum)
You are doing well.  Please hold the pradaxa when you run out (take this twice a day)  Then start the xarelto one a day (blood thinner)  Please monitor your weight, For weight gain of 3 to 4 pounds, Take an extra lasix and potassium after lunch (2 pm)  Please call us if you have new issues that need to be addressed before your next appt.  Your physician wants you to follow-up in: 6 months.  You will receive a reminder letter in the mail two months in advance. If you don't receive a letter, please call our office to schedule the follow-up appointment.

## 2015-05-17 NOTE — Assessment & Plan Note (Signed)
Stable symptoms, stopped smoking more than 10 years ago

## 2015-05-17 NOTE — Assessment & Plan Note (Signed)
Most recent lipid panel not available. We'll discuss this with her and her next clinic visit whether she needs to be on a low-dose statin

## 2015-05-28 ENCOUNTER — Ambulatory Visit
Admission: RE | Admit: 2015-05-28 | Discharge: 2015-05-28 | Disposition: A | Discharge status: Home / Self Care | Payer: Medicare Other | Source: Ambulatory Visit | Attending: Oncology | Admitting: Oncology

## 2015-05-28 ENCOUNTER — Other Ambulatory Visit: Payer: Self-pay | Admitting: Family Medicine

## 2015-05-28 ENCOUNTER — Other Ambulatory Visit: Payer: Self-pay | Admitting: *Deleted

## 2015-05-28 DIAGNOSIS — I289 Disease of pulmonary vessels, unspecified: Secondary | ICD-10-CM | POA: Diagnosis not present

## 2015-05-28 DIAGNOSIS — C3490 Malignant neoplasm of unspecified part of unspecified bronchus or lung: Secondary | ICD-10-CM

## 2015-05-28 DIAGNOSIS — M8580 Other specified disorders of bone density and structure, unspecified site: Secondary | ICD-10-CM | POA: Diagnosis not present

## 2015-05-28 DIAGNOSIS — I7090 Unspecified atherosclerosis: Secondary | ICD-10-CM | POA: Insufficient documentation

## 2015-05-28 DIAGNOSIS — M4854XA Collapsed vertebra, not elsewhere classified, thoracic region, initial encounter for fracture: Secondary | ICD-10-CM | POA: Diagnosis not present

## 2015-05-28 DIAGNOSIS — I2510 Atherosclerotic heart disease of native coronary artery without angina pectoris: Secondary | ICD-10-CM | POA: Diagnosis not present

## 2015-05-28 DIAGNOSIS — K449 Diaphragmatic hernia without obstruction or gangrene: Secondary | ICD-10-CM | POA: Insufficient documentation

## 2015-05-28 DIAGNOSIS — Z923 Personal history of irradiation: Secondary | ICD-10-CM | POA: Insufficient documentation

## 2015-05-28 DIAGNOSIS — R911 Solitary pulmonary nodule: Secondary | ICD-10-CM | POA: Insufficient documentation

## 2015-05-28 HISTORY — DX: Systemic involvement of connective tissue, unspecified: M35.9

## 2015-05-28 MED ORDER — IOHEXOL 300 MG/ML  SOLN
60.0000 mL | Freq: Once | INTRAMUSCULAR | Status: AC | PRN
  Administered 2015-05-28: 75 mL via INTRAVENOUS

## 2015-05-28 MED ORDER — IOPAMIDOL (ISOVUE-300) INJECTION 61%
60.0000 mL | Freq: Once | INTRAVENOUS | Status: DC | PRN
Start: 2015-05-28 — End: 2015-05-29
  Filled 2015-05-28: qty 60

## 2015-05-31 ENCOUNTER — Inpatient Hospital Stay (HOSPITAL_BASED_OUTPATIENT_CLINIC_OR_DEPARTMENT_OTHER): Payer: Medicare Other | Admitting: Oncology

## 2015-05-31 ENCOUNTER — Encounter: Payer: Self-pay | Admitting: Oncology

## 2015-05-31 ENCOUNTER — Inpatient Hospital Stay: Payer: Medicare Other | Attending: Oncology

## 2015-05-31 VITALS — BP 130/71 | HR 54 | Temp 98.2°F | Resp 18 | Ht 63.0 in | Wt 181.0 lb

## 2015-05-31 DIAGNOSIS — Z7901 Long term (current) use of anticoagulants: Secondary | ICD-10-CM

## 2015-05-31 DIAGNOSIS — J90 Pleural effusion, not elsewhere classified: Secondary | ICD-10-CM

## 2015-05-31 DIAGNOSIS — I4891 Unspecified atrial fibrillation: Secondary | ICD-10-CM | POA: Insufficient documentation

## 2015-05-31 DIAGNOSIS — J9801 Acute bronchospasm: Secondary | ICD-10-CM | POA: Diagnosis not present

## 2015-05-31 DIAGNOSIS — F17210 Nicotine dependence, cigarettes, uncomplicated: Secondary | ICD-10-CM | POA: Insufficient documentation

## 2015-05-31 DIAGNOSIS — I10 Essential (primary) hypertension: Secondary | ICD-10-CM | POA: Diagnosis not present

## 2015-05-31 DIAGNOSIS — Z923 Personal history of irradiation: Secondary | ICD-10-CM | POA: Insufficient documentation

## 2015-05-31 DIAGNOSIS — I5032 Chronic diastolic (congestive) heart failure: Secondary | ICD-10-CM | POA: Diagnosis not present

## 2015-05-31 DIAGNOSIS — C3402 Malignant neoplasm of left main bronchus: Secondary | ICD-10-CM

## 2015-05-31 DIAGNOSIS — J449 Chronic obstructive pulmonary disease, unspecified: Secondary | ICD-10-CM | POA: Diagnosis not present

## 2015-05-31 DIAGNOSIS — I2510 Atherosclerotic heart disease of native coronary artery without angina pectoris: Secondary | ICD-10-CM | POA: Diagnosis not present

## 2015-05-31 DIAGNOSIS — K449 Diaphragmatic hernia without obstruction or gangrene: Secondary | ICD-10-CM | POA: Diagnosis not present

## 2015-05-31 DIAGNOSIS — Z79899 Other long term (current) drug therapy: Secondary | ICD-10-CM

## 2015-05-31 DIAGNOSIS — M8580 Other specified disorders of bone density and structure, unspecified site: Secondary | ICD-10-CM | POA: Insufficient documentation

## 2015-05-31 DIAGNOSIS — C3490 Malignant neoplasm of unspecified part of unspecified bronchus or lung: Secondary | ICD-10-CM

## 2015-05-31 LAB — CBC WITH DIFFERENTIAL/PLATELET
BASOS ABS: 0.1 10*3/uL (ref 0–0.1)
Basophils Relative: 1 %
EOS PCT: 5 %
Eosinophils Absolute: 0.2 10*3/uL (ref 0–0.7)
HCT: 33.8 % — ABNORMAL LOW (ref 35.0–47.0)
Hemoglobin: 11 g/dL — ABNORMAL LOW (ref 12.0–16.0)
LYMPHS PCT: 21 %
Lymphs Abs: 1.1 10*3/uL (ref 1.0–3.6)
MCH: 28.4 pg (ref 26.0–34.0)
MCHC: 32.4 g/dL (ref 32.0–36.0)
MCV: 87.6 fL (ref 80.0–100.0)
MONO ABS: 0.5 10*3/uL (ref 0.2–0.9)
Monocytes Relative: 9 %
Neutro Abs: 3.6 10*3/uL (ref 1.4–6.5)
Neutrophils Relative %: 64 %
PLATELETS: 245 10*3/uL (ref 150–440)
RBC: 3.86 MIL/uL (ref 3.80–5.20)
RDW: 15.6 % — AB (ref 11.5–14.5)
WBC: 5.5 10*3/uL (ref 3.6–11.0)

## 2015-05-31 LAB — COMPREHENSIVE METABOLIC PANEL
ALT: 11 U/L — ABNORMAL LOW (ref 14–54)
AST: 16 U/L (ref 15–41)
Albumin: 3.7 g/dL (ref 3.5–5.0)
Alkaline Phosphatase: 91 U/L (ref 38–126)
Anion gap: 7 (ref 5–15)
BUN: 26 mg/dL — ABNORMAL HIGH (ref 6–20)
CHLORIDE: 101 mmol/L (ref 101–111)
CO2: 30 mmol/L (ref 22–32)
Calcium: 8.5 mg/dL — ABNORMAL LOW (ref 8.9–10.3)
Creatinine, Ser: 1.22 mg/dL — ABNORMAL HIGH (ref 0.44–1.00)
GFR, EST AFRICAN AMERICAN: 48 mL/min — AB (ref >60)
GFR, EST NON AFRICAN AMERICAN: 41 mL/min — AB (ref >60)
Glucose, Bld: 99 mg/dL (ref 65–99)
POTASSIUM: 4 mmol/L (ref 3.5–5.1)
Sodium: 138 mmol/L (ref 135–145)
Total Bilirubin: 0.6 mg/dL (ref 0.3–1.2)
Total Protein: 6.8 g/dL (ref 6.5–8.1)

## 2015-05-31 NOTE — Progress Notes (Signed)
Patient had "almost 3 liters of fluid removed from right lung at [facility] in [place] a month ago.

## 2015-05-31 NOTE — Progress Notes (Signed)
[place] @ [facility] Telephone:([phone]) [phone]  Fax:([phone]) [place]: June 23, 1935  MR#: [id number]  OYD#:[id number]  Patient Care Team: Tomma Rakers, PA-C as PCP - General (Physician Assistant)  CHIEF COMPLAINT:  Chief Complaint  Patient presents with  . Lung Cancer  . Results    ct scan results-ct of chest on 05/28/15   1.squamous cell carcinoma for lung T1, N0, M0 tumor.  (July, 2015) There are other 2 smaller nodules etiology of which is not clear STATUS POST STEREOTACTIC RADIATION THERAPY(sEPTEMBER, 2015)   Oncology Flowsheet 11/19/2013 11/19/2013 11/20/2013 11/21/2013 11/22/2013 11/23/2013 11/24/2013  ALPRAZolam (XANAX) PO - - - - - - -  methylPREDNISolone sodium succinate 125 mg/2 mL (SOLU-MEDROL) IV 60 mg 60 mg 60 mg - - - -  predniSONE (DELTASONE) PO - - 40 mg 40 mg 30 mg 30 mg 30 mg    INTERVAL HISTORY: 80 year old lady came today further follow-up.  Patient had been to emergency room in [place] with the acute onset of shortness of breath.  Review of the record shows blunting of the diaphragm right pleural effusion patient received intravenous Lasix resulting in to diuresis of 3 L of fluid. Shortness of breath is improved. Had a CT scan for follow-up  REVIEW OF SYSTEMS:    general status: Patient is feeling weak and tired.  No change in a performance status.  No chills.  No fever. HEENT: No evidence of stomatitis Lungs: No cough or shortness of breath See present date to emergency room with history of acute shortness of breath Had a chest x-ray which has been reviewed.  Had right pleural effusion. Cardiac: No chest pain or paroxysmal nocturnal dyspnea GI: No nausea no vomiting no diarrhea no abdominal pain Skin: No rash Lower extremity no swelling Neurological system: No tingling.  No numbness.  No other focal signs Musculoskeletal system no bony pains  As per HPI. Otherwise, a complete review of systems is negatve.  PAST MEDICAL HISTORY: Past Medical  History  Diagnosis Date  . Cataract   . COPD (chronic obstructive pulmonary disease)   . Essential hypertension, benign   . Hypothyroidism   . Atypical atrial flutter 11/16/2013  . Arrhythmia   . A-fib   . Chronic diastolic heart failure   . Hypokalemia   . Lung nodule     Followed by Dr. Raul Del  . 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 Lung cancer   . Collagen vascular disease   Significant History/PMH:   COPD:    Hypertension:    Hypothyroidism:    Atrial Fibrillation:   Smoking History: Smoking History 1.5 Packs per day and x 32 years. Smoking Cessation Information Given to Patient .  PFSH: Family History: noncontributory  Social History: negative alcohol  Additional Past Medical and Surgical History: COPD using oxygen at nighttime  Atrial fibrillation.  hypertension  Hypothyroidism  Chronic diastolic heart failure  Hypokalemia  cataract    PAST SURGICAL HISTORY: Past Surgical History  Procedure Laterality Date  . Eye surgery    . Total knee arthroplasty Bilateral     FAMILY HISTORY Family History  Problem Relation Age of Onset  . CAD Father   . Heart disease Father   . Heart attack Father   . Arrhythmia Sister     ADVANCED DIRECTIVES:  No flowsheet data found.  HEALTH MAINTENANCE: Social History  Substance Use Topics  . Smoking status: Former Smoker -- 1.50 packs/day for  50 years    Types: Cigarettes    Quit date: 09/18/2002  . Smokeless tobacco: Never Used  . Alcohol Use: No      Allergies  Allergen Reactions  . Belladonna Alkaloids     "made eyes cross"     Current Outpatient Prescriptions  Medication Sig Dispense Refill  . albuterol (PROVENTIL HFA;VENTOLIN HFA) 108 (90 BASE) MCG/ACT inhaler Inhale 1-2 puffs into the lungs every 6 (six) hours as needed for wheezing or shortness of breath.    . budesonide (PULMICORT) 0.5 MG/2ML nebulizer solution Take 2 mLs (0.5 mg total) by nebulization 2 (two) times daily.  120 mL 11  . diltiazem (TIAZAC) 300 MG 24 hr capsule Take 1 capsule (300 mg total) by mouth daily. 30 capsule 11  . flecainide (TAMBOCOR) 100 MG tablet Take 1 tablet (100 mg total) by mouth 2 (two) times daily. 60 tablet 11  . formoterol (PERFOROMIST) 20 MCG/2ML nebulizer solution Take 20 mcg by nebulization 2 (two) times daily.    . furosemide (LASIX) 40 MG tablet Take 1 tablet (40 mg total) by mouth 2 (two) times daily as needed. (Patient taking differently: Take 40 mg by mouth daily. ) 60 tablet 6  . ipratropium-albuterol (DUONEB) 0.5-2.5 (3) MG/3ML SOLN Take 3 mLs by nebulization daily as needed. 90 mL 6  . levothyroxine (SYNTHROID, LEVOTHROID) 75 MCG tablet Take 75 mcg by mouth daily before breakfast.    . Omega-3 Fatty Acids (FISH OIL) 1000 MG CAPS Take 1,000 mg by mouth daily.    . potassium chloride (K-DUR,KLOR-CON) 10 MEQ tablet Take 1 tablet (10 mEq total) by mouth 2 (two) times daily as needed. 60 tablet 6  . rivaroxaban (XARELTO) 20 MG TABS tablet Take 1 tablet (20 mg total) by mouth daily with supper. 90 tablet 3  . omeprazole (PRILOSEC) 20 MG capsule Take 20 mg by mouth as needed.      No current facility-administered medications for this visit.    OBJECTIVE:  Filed Vitals:   05/31/15 1434  BP: 130/71  Pulse: 54  Temp: 98.2 F (36.8 C)  Resp: 18     Body mass index is 32.07 kg/(m^2).    ECOG FS:1 - Symptomatic but completely ambulatory  PHYSICAL EXAM: Gen. status: Patient is alert oriented not any acute distress Lungs: Diminished air entry on both sides bilateral rhonchi.  Cardiac: Irregular heart sounds. Abdomen: Soft.  Liver and spleen not palpable Lower extremity no edema Neurological system no localizing sign Lymphatic system: Supraclavicular, cervical, axillary, inguinal lymph nodes are not palpable Head exam was generally normal. There was no scleral icterus or corneal arcus. Mucous membranes were moist.    LAB RESULTS:  CBC Latest Ref Rng 05/31/2015  08/05/2014  WBC 3.6 - 11.0 K/uL 5.5 6.7  Hemoglobin 12.0 - 16.0 g/dL 11.0(L) 9.1(L)  Hematocrit 35.0 - 47.0 % 33.8(L) 29.4(L)  Platelets 150 - 440 K/uL 245 267    Appointment on 05/31/2015  Component Date Value Ref Range Status  . WBC 05/31/2015 5.5  3.6 - 11.0 K/uL Final  . RBC 05/31/2015 3.86  3.80 - 5.20 MIL/uL Final  . Hemoglobin 05/31/2015 11.0* 12.0 - 16.0 g/dL Final  . HCT 05/31/2015 33.8* 35.0 - 47.0 % Final  . MCV 05/31/2015 87.6  80.0 - 100.0 fL Final  . MCH 05/31/2015 28.4  26.0 - 34.0 pg Final  . MCHC 05/31/2015 32.4  32.0 - 36.0 g/dL Final  . RDW 05/31/2015 15.6* 11.5 - 14.5 % Final  . Platelets 05/31/2015  245  150 - 440 K/uL Final  . Neutrophils Relative % 05/31/2015 64   Final  . Neutro Abs 05/31/2015 3.6  1.4 - 6.5 K/uL Final  . Lymphocytes Relative 05/31/2015 21   Final  . Lymphs Abs 05/31/2015 1.1  1.0 - 3.6 K/uL Final  . Monocytes Relative 05/31/2015 9   Final  . Monocytes Absolute 05/31/2015 0.5  0.2 - 0.9 K/uL Final  . Eosinophils Relative 05/31/2015 5   Final  . Eosinophils Absolute 05/31/2015 0.2  0 - 0.7 K/uL Final  . Basophils Relative 05/31/2015 1   Final  . Basophils Absolute 05/31/2015 0.1  0 - 0.1 K/uL Final  . Sodium 05/31/2015 138  135 - 145 mmol/L Final  . Potassium 05/31/2015 4.0  3.5 - 5.1 mmol/L Final  . Chloride 05/31/2015 101  101 - 111 mmol/L Final  . CO2 05/31/2015 30  22 - 32 mmol/L Final  . Glucose, Bld 05/31/2015 99  65 - 99 mg/dL Final  . BUN 05/31/2015 26* 6 - 20 mg/dL Final  . Creatinine, Ser 05/31/2015 1.22* 0.44 - 1.00 mg/dL Final  . Calcium 05/31/2015 8.5* 8.9 - 10.3 mg/dL Final  . Total Protein 05/31/2015 6.8  6.5 - 8.1 g/dL Final  . Albumin 05/31/2015 3.7  3.5 - 5.0 g/dL Final  . AST 05/31/2015 16  15 - 41 U/L Final  . ALT 05/31/2015 11* 14 - 54 U/L Final  . Alkaline Phosphatase 05/31/2015 91  38 - 126 U/L Final  . Total Bilirubin 05/31/2015 0.6  0.3 - 1.2 mg/dL Final  . GFR calc non Af Amer 05/31/2015 41* >60 mL/min Final    . GFR calc Af Amer 05/31/2015 48* >60 mL/min Final   Comment: (NOTE) The eGFR has been calculated using the CKD EPI equation. This calculation has not been validated in all clinical situations. eGFR's persistently <60 mL/min signify possible Chronic Kidney Disease.   . Anion gap 05/31/2015 7  5 - 15 Final       STUDIES: Ct Chest W Contrast  05/28/2015   CLINICAL DATA:  Followup of lung nodule. Lung cancer, treated with radiation therapy.  EXAM: CT CHEST WITH CONTRAST  TECHNIQUE: Multidetector CT imaging of the chest was performed during intravenous contrast administration.  CONTRAST:  40m OMNIPAQUE IOHEXOL 300 MG/ML  SOLN  COMPARISON:  Plain film of 05/06/2015. PET of 11/19/2014. Most recent diagnostic chest CT of 03/10/2014.  FINDINGS: Mediastinum/Nodes: No supraclavicular adenopathy. Advanced aortic and branch vessel atherosclerosis. Mild cardiomegaly with coronary artery atherosclerosis. Pulmonary artery enlargement, outflow tract 3.5 cm. No central pulmonary embolism, on this non-dedicated study.  The previously described precarinal node maintains its fatty hilum and measures 1.2 cm today on image 19. 1.5 cm on the prior. No hilar adenopathy.  A small hiatal hernia.  Lungs/Pleura: No pleural fluid. Bibasilar subsegmental atelectasis. Minimal volume loss and ground-glass opacity involving the inferior right upper lobe, including on image 21 of series 3. No residual nodularity identified.  Left lower lobe pulmonary nodule measures 8 x 7 mm on image 20. 8 mm craniocaudal on sagittal image 101. When compared back to 03/10/2014, this may have enlarged, measuring 7 x 5 mm transverse and 6 mm craniocaudal on that study (when remeasured). This was however, present and measured 6 mm maximally on 09/15/2013.  Upper abdomen: Old granulomatous disease in the liver. Normal imaged portions of the spleen, stomach, adrenal glands. Pancreatic and renal atrophy, incompletely imaged.  Musculoskeletal: Osteopenia.  Accentuation of expected thoracic kyphosis. Mild mid thoracic  vertebral body height loss at multiple levels, similar.  IMPRESSION: 1. Further response to therapy. Right upper lobe patchy opacity is likely due to radiation change. No evidence of residual or recurrent disease. 2. Decreased size of a precarinal node which was likely reactive. 3. 8 left lower lobe lung nodule may measure minimally larger compared to prior exams. As this was present back to 09/15/2013, considerations include a benign nodule or an indolent neoplasm. Recommend attention on follow-up. 4. Atherosclerosis, including within the coronary arteries. Cardiomegaly. 5. Pulmonary artery enlargement suggests pulmonary arterial hypertension. 6. Hiatal hernia. 7. Osteopenia with mild chronic thoracic compression deformities.   Electronically Signed   By: Abigail Miyamoto M.D.   On: 05/28/2015 16:59    ASSESSMENT: 1.  Squamous cell carcinoma of lung T1 N1 M0 tumor status post radiation therapy with stereotactic surgery.  Lymph node has decreased in size left upper lobe nodule is decreased.  Left lower lobe lung nodule needs to be observed.  MEDICAL DECISION MAKING:  All lab data has been reviewed.  CT scan has been reviewed. Will follow with another CT scan. Patient had mild congestive heart failure or may be right-sided lung failure due to increasing pulmonary hypertension. Patient does have bronchospasm.  Aggressive bronchodilator therapy may be needed. Patient has been referred to pulmonologist for evaluation. Patient is on diuretic therapy   Patient expressed understanding and was in agreement with this plan. She also understands that She can call clinic at any time with any questions, concerns, or complaints.    No matching staging information was found for the patient.  Forest Gleason, MD   05/31/2015 3:00 PM

## 2015-06-08 ENCOUNTER — Ambulatory Visit (INDEPENDENT_AMBULATORY_CARE_PROVIDER_SITE_OTHER): Payer: Medicare Other | Admitting: Internal Medicine

## 2015-06-08 ENCOUNTER — Encounter: Payer: Self-pay | Admitting: Internal Medicine

## 2015-06-08 VITALS — BP 148/86 | HR 70 | Ht 63.0 in | Wt 179.0 lb

## 2015-06-08 DIAGNOSIS — J432 Centrilobular emphysema: Secondary | ICD-10-CM

## 2015-06-08 DIAGNOSIS — R918 Other nonspecific abnormal finding of lung field: Secondary | ICD-10-CM

## 2015-06-08 MED ORDER — FORMOTEROL FUMARATE 20 MCG/2ML IN NEBU: 20.0000 ug | INHALATION_SOLUTION | Freq: Two times a day (BID) | RESPIRATORY_TRACT | Status: DC

## 2015-06-08 MED ORDER — IPRATROPIUM-ALBUTEROL 0.5-2.5 (3) MG/3ML IN SOLN: 3.0000 mL | Freq: Every day | RESPIRATORY_TRACT | Status: DC | PRN

## 2015-06-08 MED ORDER — ALBUTEROL SULFATE HFA 108 (90 BASE) MCG/ACT IN AERS: 1.0000 | INHALATION_SPRAY | Freq: Four times a day (QID) | RESPIRATORY_TRACT | Status: DC | PRN

## 2015-06-08 MED ORDER — BUDESONIDE 0.5 MG/2ML IN SUSP: 0.5000 mg | Freq: Two times a day (BID) | RESPIRATORY_TRACT | Status: DC

## 2015-06-08 NOTE — Progress Notes (Signed)
[facility] Consultation      Date: 06/08/2015,   MRN# [id number] HEIDY MCCUBBIN 1935/08/04 Code Status:  Hosp day:@LENGTHOFSTAYDAYS @ Referring MD: @ATDPROV @     PCP:      AdmissionWeight: 179 lb (81.194 kg)                 CurrentWeight: 179 lb (81.194 kg)      CHIEF COMPLAINT:   abnromal CT chest   HISTORY OF PRESENT ILLNESS   80 yo pleasant white female with h/o COPD and Stage 1 SQ cell cancer dx in 2015 s/p Ct guided biopsy Patient recently had acute SOB several weeks ago and likely dx was acute CHF exacerbation as he improved with aggressive lasix therapy.   Patient has previously seen Dr Raul Del and Dr. [place] Bells in the past for COPD she is on inhlaed neb therapy and states that these are working really well. Patient still woks in catering busines and has no acute issues with her breathing  Patient has no fevers, chills, NVD.   Ct chest reveals R lung Ground glass opacity, however, patient has no acute resp or infectious symptoms at this time.    PAST MEDICAL HISTORY   Past Medical History  Diagnosis Date  . Cataract   . COPD (chronic obstructive pulmonary disease)   . Essential hypertension, benign   . Hypothyroidism   . Atypical atrial flutter 11/16/2013  . Arrhythmia   . A-fib   . Chronic diastolic heart failure   . Hypokalemia   . Lung nodule     Followed by Dr. Raul Del  . 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 Lung cancer   . Collagen vascular disease      SURGICAL HISTORY   Past Surgical History  Procedure Laterality Date  . Eye surgery    . Total knee arthroplasty Bilateral      FAMILY HISTORY   Family History  Problem Relation Age of Onset  . CAD Father   . Heart disease Father   . Heart attack Father   . Arrhythmia Sister      SOCIAL HISTORY   Social History  Substance Use Topics  . Smoking status: Former Smoker -- 1.50 packs/day for 50 years    Types: Cigarettes    Quit date:  09/18/2002  . Smokeless tobacco: Never Used  . Alcohol Use: No     MEDICATIONS    Home Medication:  Current Outpatient Rx  Name  Route  Sig  Dispense  Refill  . albuterol (PROVENTIL HFA;VENTOLIN HFA) 108 (90 BASE) MCG/ACT inhaler   Inhalation   Inhale 1-2 puffs into the lungs every 6 (six) hours as needed for wheezing or shortness of breath.   1 Inhaler   12   . budesonide (PULMICORT) 0.5 MG/2ML nebulizer solution   Nebulization   Take 2 mLs (0.5 mg total) by nebulization 2 (two) times daily.   120 mL   11   . diltiazem (TIAZAC) 300 MG 24 hr capsule   Oral   Take 1 capsule (300 mg total) by mouth daily.   30 capsule   11   . flecainide (TAMBOCOR) 100 MG tablet   Oral   Take 1 tablet (100 mg total) by mouth 2 (two) times daily.   60 tablet   11   . formoterol (PERFOROMIST) 20 MCG/2ML nebulizer solution   Nebulization   Take 2 mLs (20 mcg total) by nebulization 2 (two) times daily.  2 mL   12   . furosemide (LASIX) 40 MG tablet   Oral   Take 1 tablet (40 mg total) by mouth 2 (two) times daily as needed. Patient taking differently: Take 40 mg by mouth daily.    60 tablet   6   . ipratropium-albuterol (DUONEB) 0.5-2.5 (3) MG/3ML SOLN   Nebulization   Take 3 mLs by nebulization daily as needed.   90 mL   6   . levothyroxine (SYNTHROID, LEVOTHROID) 75 MCG tablet   Oral   Take 75 mcg by mouth daily before breakfast.         . Omega-3 Fatty Acids (FISH OIL) 1000 MG CAPS   Oral   Take 1,000 mg by mouth daily.         Marland Kitchen omeprazole (PRILOSEC) 20 MG capsule   Oral   Take 20 mg by mouth as needed.          . potassium chloride (K-DUR,KLOR-CON) 10 MEQ tablet   Oral   Take 1 tablet (10 mEq total) by mouth 2 (two) times daily as needed.   60 tablet   6   . rivaroxaban (XARELTO) 20 MG TABS tablet   Oral   Take 1 tablet (20 mg total) by mouth daily with supper.   90 tablet   3     Current Medication:  Current outpatient prescriptions:  .   albuterol (PROVENTIL HFA;VENTOLIN HFA) 108 (90 BASE) MCG/ACT inhaler, Inhale 1-2 puffs into the lungs every 6 (six) hours as needed for wheezing or shortness of breath., Disp: 1 Inhaler, Rfl: 12 .  budesonide (PULMICORT) 0.5 MG/2ML nebulizer solution, Take 2 mLs (0.5 mg total) by nebulization 2 (two) times daily., Disp: 120 mL, Rfl: 11 .  diltiazem (TIAZAC) 300 MG 24 hr capsule, Take 1 capsule (300 mg total) by mouth daily., Disp: 30 capsule, Rfl: 11 .  flecainide (TAMBOCOR) 100 MG tablet, Take 1 tablet (100 mg total) by mouth 2 (two) times daily., Disp: 60 tablet, Rfl: 11 .  formoterol (PERFOROMIST) 20 MCG/2ML nebulizer solution, Take 2 mLs (20 mcg total) by nebulization 2 (two) times daily., Disp: 2 mL, Rfl: 12 .  furosemide (LASIX) 40 MG tablet, Take 1 tablet (40 mg total) by mouth 2 (two) times daily as needed. (Patient taking differently: Take 40 mg by mouth daily. ), Disp: 60 tablet, Rfl: 6 .  ipratropium-albuterol (DUONEB) 0.5-2.5 (3) MG/3ML SOLN, Take 3 mLs by nebulization daily as needed., Disp: 90 mL, Rfl: 6 .  levothyroxine (SYNTHROID, LEVOTHROID) 75 MCG tablet, Take 75 mcg by mouth daily before breakfast., Disp: , Rfl:  .  Omega-3 Fatty Acids (FISH OIL) 1000 MG CAPS, Take 1,000 mg by mouth daily., Disp: , Rfl:  .  omeprazole (PRILOSEC) 20 MG capsule, Take 20 mg by mouth as needed. , Disp: , Rfl:  .  potassium chloride (K-DUR,KLOR-CON) 10 MEQ tablet, Take 1 tablet (10 mEq total) by mouth 2 (two) times daily as needed., Disp: 60 tablet, Rfl: 6 .  rivaroxaban (XARELTO) 20 MG TABS tablet, Take 1 tablet (20 mg total) by mouth daily with supper., Disp: 90 tablet, Rfl: 3    ALLERGIES   Belladonna alkaloids     REVIEW OF SYSTEMS   Review of Systems  Constitutional: Negative for fever, chills, weight loss, malaise/fatigue and diaphoresis.  HENT: Negative for congestion.   Eyes: Negative for blurred vision.  Respiratory: Positive for shortness of breath. Negative for cough, hemoptysis,  sputum production and wheezing.   Cardiovascular:  Negative for chest pain, palpitations and orthopnea.  Gastrointestinal: Negative for heartburn, nausea, vomiting, abdominal pain and constipation.  Genitourinary: Negative for dysuria and urgency.  Musculoskeletal: Negative for back pain.  Skin: Negative for rash.  Neurological: Negative for dizziness and weakness.  Endo/Heme/Allergies: Does not bruise/bleed easily.  Psychiatric/Behavioral: Positive for suicidal ideas. The patient is not nervous/anxious.   All other systems reviewed and are negative.    VS: BP 148/86 mmHg  Pulse 70  Ht 5' 3"  (1.6 m)  Wt 179 lb (81.194 kg)  BMI 31.72 kg/m2  SpO2 98%     PHYSICAL EXAM  Physical Exam      LABS    No results for input(s): HGB, HCT, MCV, WBC, POTASSIUM, CHLORIDE, BUN, CREATININE, GLUCOSE, CALCIUM, INR, PTT in the last 72 hours.  Invalid input(s): PLATELET, BANDS, NEUTROPHIL, LYMPHOCYTE, MONOCYTE, EOSINOPHILS, BASOPHIL, SODIUM, BICARBONATE, MAGNESIUM, PHOSPHORUS, PT, SGPT, SGOT,    No results for input(s): PH in the last 72 hours.  Invalid input(s): PCO2, PO2, BASEEXCESS, BASEDEFICITE, TFT    CULTURE RESULTS   No results found for this or any previous visit (from the past 240 hour(s)).        IMAGING    Ct Chest W Contrast  05/28/2015   CLINICAL DATA:  Followup of lung nodule. Lung cancer, treated with radiation therapy.  EXAM: CT CHEST WITH CONTRAST  TECHNIQUE: Multidetector CT imaging of the chest was performed during intravenous contrast administration.  CONTRAST:  78m OMNIPAQUE IOHEXOL 300 MG/ML  SOLN  COMPARISON:  Plain film of 05/06/2015. PET of 11/19/2014. Most recent diagnostic chest CT of 03/10/2014.  FINDINGS: Mediastinum/Nodes: No supraclavicular adenopathy. Advanced aortic and branch vessel atherosclerosis. Mild cardiomegaly with coronary artery atherosclerosis. Pulmonary artery enlargement, outflow tract 3.5 cm. No central pulmonary embolism, on this  non-dedicated study.  The previously described precarinal node maintains its fatty hilum and measures 1.2 cm today on image 19. 1.5 cm on the prior. No hilar adenopathy.  A small hiatal hernia.  Lungs/Pleura: No pleural fluid. Bibasilar subsegmental atelectasis. Minimal volume loss and ground-glass opacity involving the inferior right upper lobe, including on image 21 of series 3. No residual nodularity identified.  Left lower lobe pulmonary nodule measures 8 x 7 mm on image 20. 8 mm craniocaudal on sagittal image 101. When compared back to 03/10/2014, this may have enlarged, measuring 7 x 5 mm transverse and 6 mm craniocaudal on that study (when remeasured). This was however, present and measured 6 mm maximally on 09/15/2013.  Upper abdomen: Old granulomatous disease in the liver. Normal imaged portions of the spleen, stomach, adrenal glands. Pancreatic and renal atrophy, incompletely imaged.  Musculoskeletal: Osteopenia. Accentuation of expected thoracic kyphosis. Mild mid thoracic vertebral body height loss at multiple levels, similar.  IMPRESSION: 1. Further response to therapy. Right upper lobe patchy opacity is likely due to radiation change. No evidence of residual or recurrent disease. 2. Decreased size of a precarinal node which was likely reactive. 3. 8 left lower lobe lung nodule may measure minimally larger compared to prior exams. As this was present back to 09/15/2013, considerations include a benign nodule or an indolent neoplasm. Recommend attention on follow-up. 4. Atherosclerosis, including within the coronary arteries. Cardiomegaly. 5. Pulmonary artery enlargement suggests pulmonary arterial hypertension. 6. Hiatal hernia. 7. Osteopenia with mild chronic thoracic compression deformities.   Electronically Signed   By: KAbigail MiyamotoM.D.   On: 05/28/2015 16:59           ASSESSMENT/PLAN    80yo  white female with moderate COPD which is stable at this time with RUL patchy GGO likley from  acute CHF exacerbation  COPD (chronic obstructive pulmonary disease) -stable at this time -will continue inhaled medications as prescribed-this seems to help her breathing -continue oxygen at night -patient reports pneumovax(2014)  -recommend flu shot     RUL patchy GGO-repeat CT chest in 3 months and follow up in 3 months    I have personally obtained a history, examined the patient, evaluated laboratory and independently reviewed imaging results, formulated the assessment and plan and placed orders.  The Patient requires high complexity decision making for assessment and support, frequent evaluation and titration of therapies, application of advanced monitoring technologies and extensive interpretation of multiple databases. Time spent with patient 30 minutes.  Patient is satisfied with Plan of action and management.    Corrin Parker, M.D.  [facility]  Medical Director [place] Director [facility] Cardio-Pulmonary Department

## 2015-06-08 NOTE — Assessment & Plan Note (Signed)
-  stable at this time -will continue inhaled medications as prescribed-this seems to help her breathing -continue oxygen at night -patient reports pneumovax(2014)  -recommend flu shot

## 2015-06-08 NOTE — Assessment & Plan Note (Signed)
Repeat CT chest in 3 months

## 2015-06-08 NOTE — Patient Instructions (Signed)
Chronic Obstructive Pulmonary Disease  Chronic obstructive pulmonary disease (COPD) is a common lung condition in which airflow from the lungs is limited. COPD is a general term that can be used to describe many different lung problems that limit airflow, including both chronic bronchitis and emphysema. If you have COPD, your lung function will probably never return to normal, but there are measures you can take to improve lung function and make yourself feel better.   CAUSES    Smoking (common).    Exposure to secondhand smoke.    Genetic problems.   Chronic inflammatory lung diseases or recurrent infections.  SYMPTOMS    Shortness of breath, especially with physical activity.    Deep, persistent (chronic) cough with a large amount of thick mucus.    Wheezing.    Rapid breaths (tachypnea).    Gray or bluish discoloration (cyanosis) of the skin, especially in fingers, toes, or lips.    Fatigue.    Weight loss.    Frequent infections or episodes when breathing symptoms become much worse (exacerbations).    Chest tightness.  DIAGNOSIS   Your health care Toni [name] will take a medical history and perform a physical examination to make the initial diagnosis. Additional tests for COPD may include:    Lung (pulmonary) function tests.   Chest X-ray.   CT scan.   Blood tests.  TREATMENT   Treatment available to help you feel better when you have COPD includes:    Inhaler and nebulizer medicines. These help manage the symptoms of COPD and make your breathing more comfortable.   Supplemental oxygen. Supplemental oxygen is only helpful if you have a low oxygen level in your blood.    Exercise and physical activity. These are beneficial for nearly all people with COPD. Some people may also benefit from a pulmonary rehabilitation program.  HOME CARE INSTRUCTIONS    Take all medicines (inhaled or pills) as directed by your health care Toni [name].   Avoid over-the-counter medicines or cough syrups  that dry up your airway (such as antihistamines) and slow down the elimination of secretions unless instructed otherwise by your health care Toni [name].    If you are a smoker, the most important thing that you can do is stop smoking. Continuing to smoke will cause further lung damage and breathing trouble. Ask your health care Toni [name] for help with quitting smoking. He or she can direct you to community resources or hospitals that provide support.   Avoid exposure to irritants such as smoke, chemicals, and fumes that aggravate your breathing.   Use oxygen therapy and pulmonary rehabilitation if directed by your health care Toni [name]. If you require home oxygen therapy, ask your health care Toni [name] whether you should purchase a pulse oximeter to measure your oxygen level at home.    Avoid contact with individuals who have a contagious illness.   Avoid extreme temperature and humidity changes.   Eat healthy foods. Eating smaller, more frequent meals and resting before meals may help you maintain your strength.   Stay active, but balance activity with periods of rest. Exercise and physical activity will help you maintain your ability to do things you want to do.   Preventing infection and hospitalization is very important when you have COPD. Make sure to receive all the vaccines your health care Toni [name] recommends, especially the pneumococcal and influenza vaccines. Ask your health care Toni [name] whether you need a pneumonia vaccine.   Learn and use relaxation techniques to manage stress.   Learn   and use controlled breathing techniques as directed by your health care Toni [name]. Controlled breathing techniques include:    Pursed lip breathing. Start by breathing in (inhaling) through your nose for 1 second. Then, purse your lips as if you were going to whistle and breathe out (exhale) through the pursed lips for 2 seconds.    Diaphragmatic breathing. Start by putting one hand on your abdomen just above  your waist. Inhale slowly through your nose. The hand on your abdomen should move out. Then purse your lips and exhale slowly. You should be able to feel the hand on your abdomen moving in as you exhale.    Learn and use controlled coughing to clear mucus from your lungs. Controlled coughing is a series of short, progressive coughs. The steps of controlled coughing are:   1. Lean your head slightly forward.   2. Breathe in deeply using diaphragmatic breathing.   3. Try to hold your breath for 3 seconds.   4. Keep your mouth slightly open while coughing twice.   5. Spit any mucus out into a tissue.   6. Rest and repeat the steps once or twice as needed.  SEEK MEDICAL CARE IF:    You are coughing up more mucus than usual.    There is a change in the color or thickness of your mucus.    Your breathing is more labored than usual.    Your breathing is faster than usual.   SEEK IMMEDIATE MEDICAL CARE IF:    You have shortness of breath while you are resting.    You have shortness of breath that prevents you from:   Being able to talk.    Performing your usual physical activities.    You have chest pain lasting longer than 5 minutes.    Your skin color is more cyanotic than usual.   You measure low oxygen saturations for longer than 5 minutes with a pulse oximeter.  MAKE SURE YOU:    Understand these instructions.   Will watch your condition.   Will get help right away if you are not doing well or get worse.  Document Released: 06/14/2005 Document Revised: 01/19/2014 Document Reviewed: 05/01/2013  [employer] Patient Information 2015 [employer]. This information is not intended to replace advice given to you by your health care Toni [name]. Make sure you discuss any questions you have with your health care Toni [name].

## 2015-07-12 ENCOUNTER — Telehealth: Payer: Self-pay

## 2015-07-12 NOTE — Telephone Encounter (Signed)
Spoke w/ pt.  Pt was previously taking Pradaxa 150 mg, but only took once daily, so she was switched to Xarelto 20 mg on 05/17/15. She reports that if she barely touches her arm, she will develop blue spots that will spread and her whole arm will turn black. She reports that she had the same side effects w/ Pradaxa and that is why she was only taking once a day.  She would like to know if there is a blood thinner that won't cause this, as it is embarrassing.  Please advise.  Thank you.

## 2015-07-12 NOTE — Telephone Encounter (Signed)
Most blood thinners will likely cause significant bruising If she is not therapeutic, on the appropriate dose, she can have a stroke Potentially could try eliquis 5 mg twice a day Less convenient as it is twice a day pill but potentially less bleeding Would not take this once a day as then subtherapeutic If she is interested, could try samples, prescription

## 2015-07-12 NOTE — Telephone Encounter (Signed)
Pt states since she has been on Xarelto, her arms are blue, states everytime she "hits" something they turn black. Please call.

## 2015-07-13 NOTE — Telephone Encounter (Signed)
Spoke w/ pt.  Advised her of Dr. Donivan Scull recommendation. She verbalizes understanding, though she is frustrated that she has to keep her arms covered due to bruising. She would prefer to stay on the Xarelto for now, as it is once daily dosing. She states that she will call back if the discoloration affects her face; her family has been advising her to call. Asked her to call back w/ any questions or concerns.

## 2015-08-23 ENCOUNTER — Encounter: Payer: Self-pay | Admitting: Emergency Medicine

## 2015-08-23 ENCOUNTER — Emergency Department
Admission: EM | Admit: 2015-08-23 | Discharge: 2015-08-23 | Disposition: A | Discharge status: Home / Self Care | Payer: Medicare Other | Attending: Student | Admitting: Student

## 2015-08-23 DIAGNOSIS — Z792 Long term (current) use of antibiotics: Secondary | ICD-10-CM | POA: Insufficient documentation

## 2015-08-23 DIAGNOSIS — X58XXXA Exposure to other specified factors, initial encounter: Secondary | ICD-10-CM | POA: Insufficient documentation

## 2015-08-23 DIAGNOSIS — I129 Hypertensive chronic kidney disease with stage 1 through stage 4 chronic kidney disease, or unspecified chronic kidney disease: Secondary | ICD-10-CM | POA: Insufficient documentation

## 2015-08-23 DIAGNOSIS — Z87891 Personal history of nicotine dependence: Secondary | ICD-10-CM | POA: Diagnosis not present

## 2015-08-23 DIAGNOSIS — Y9389 Activity, other specified: Secondary | ICD-10-CM | POA: Diagnosis not present

## 2015-08-23 DIAGNOSIS — S81811A Laceration without foreign body, right lower leg, initial encounter: Secondary | ICD-10-CM | POA: Diagnosis not present

## 2015-08-23 DIAGNOSIS — Z7901 Long term (current) use of anticoagulants: Secondary | ICD-10-CM | POA: Diagnosis not present

## 2015-08-23 DIAGNOSIS — N182 Chronic kidney disease, stage 2 (mild): Secondary | ICD-10-CM | POA: Insufficient documentation

## 2015-08-23 DIAGNOSIS — I788 Other diseases of capillaries: Secondary | ICD-10-CM | POA: Diagnosis not present

## 2015-08-23 DIAGNOSIS — Y9289 Other specified places as the place of occurrence of the external cause: Secondary | ICD-10-CM | POA: Diagnosis not present

## 2015-08-23 DIAGNOSIS — Y998 Other external cause status: Secondary | ICD-10-CM | POA: Diagnosis not present

## 2015-08-23 DIAGNOSIS — Z79899 Other long term (current) drug therapy: Secondary | ICD-10-CM | POA: Diagnosis not present

## 2015-08-23 DIAGNOSIS — S8991XA Unspecified injury of right lower leg, initial encounter: Secondary | ICD-10-CM | POA: Diagnosis present

## 2015-08-23 NOTE — Discharge Instructions (Signed)
Leave the bandage in place for 24 hours before removing it. Follow up with Dr. Rockey Situ and advise him of the bleeding gums and the bleeding from your knee. Return to the ER for symptoms that change or worsen if unable to schedule an appointment.

## 2015-08-23 NOTE — ED Notes (Addendum)
Pt in w/ skin tear to right knee that continues to bleed for several days now; bandage saturated in blood, removed to assess, skin tear approximately 1cm in length, bruising around site.  Pt reports bruising is from tape and that she bruises easily.  Pt reports being on xarelto, denies any fall or injury to knee.  PA at bedside.

## 2015-08-23 NOTE — ED Provider Notes (Signed)
[facility] Emergency Department Provider Note ____________________________________________  Time seen: Approximately 4:46 PM  I have reviewed the triage vital signs and the nursing notes.   HISTORY  Chief Complaint Knee Injury   HPI Toni [name] is a 80 y.o. female who presents to the emergency department for evaluation of bleeding to her right knee that started Saturday. She states that she has been using the large Band-Aid, but it has been bleeding through often. She is on Xarelto. She denies injury. She denies changes in medication or health status. She reports compliance with medications and doctor appointments. She has no pain related to the bleeding.   Past Medical History  Diagnosis Date  . Cataract   . COPD (chronic obstructive pulmonary disease) ([place])   . Essential hypertension, benign   . Hypothyroidism   . Atypical atrial flutter ([place]) 11/16/2013  . Arrhythmia   . A-fib ([place])   . Chronic diastolic heart failure ([place])   . Hypokalemia   . Lung nodule     Followed by Dr. Raul Del  . 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 Lung cancer ([place])   . Collagen vascular disease [facility])     Patient Active Problem List   Diagnosis Date Noted  . Lung infiltrate on CT 06/08/2015  . Chronic diastolic CHF (congestive heart failure) ([place]) 05/17/2015  . Insomnia 04/17/2014  . Squamous cell lung cancer ([place]) 03/27/2014  . Postnasal drip 03/05/2014  . Hyperlipidemia 12/04/2013  . Leg edema 12/04/2013  . Atypical atrial flutter ([place]) 11/16/2013  . COPD (chronic obstructive pulmonary disease) ([place]) 11/15/2013  . Chronic kidney disease 08/24/2013  . Essential (primary) hypertension 08/24/2013  . Adult hypothyroidism 08/24/2013  . Acute exacerbation of chronic obstructive airways disease ([place]) 08/24/2013    Past Surgical History  Procedure Laterality Date  . Eye surgery    . Total knee arthroplasty Bilateral     Current Outpatient  Rx  Name  Route  Sig  Dispense  Refill  . albuterol (PROVENTIL HFA;VENTOLIN HFA) 108 (90 BASE) MCG/ACT inhaler   Inhalation   Inhale 1-2 puffs into the lungs every 6 (six) hours as needed for wheezing or shortness of breath.   1 Inhaler   12   . budesonide (PULMICORT) 0.5 MG/2ML nebulizer solution   Nebulization   Take 2 mLs (0.5 mg total) by nebulization 2 (two) times daily.   120 mL   11   . diltiazem (TIAZAC) 300 MG 24 hr capsule   Oral   Take 1 capsule (300 mg total) by mouth daily.   30 capsule   11   . flecainide (TAMBOCOR) 100 MG tablet   Oral   Take 1 tablet (100 mg total) by mouth 2 (two) times daily.   60 tablet   11   . formoterol (PERFOROMIST) 20 MCG/2ML nebulizer solution   Nebulization   Take 2 mLs (20 mcg total) by nebulization 2 (two) times daily.   2 mL   12   . furosemide (LASIX) 40 MG tablet   Oral   Take 1 tablet (40 mg total) by mouth 2 (two) times daily as needed. Patient taking differently: Take 40 mg by mouth daily.    60 tablet   6   . ipratropium-albuterol (DUONEB) 0.5-2.5 (3) MG/3ML SOLN   Nebulization   Take 3 mLs by nebulization daily as needed.   90 mL   6   . levothyroxine (SYNTHROID, LEVOTHROID) 75 MCG tablet  Oral   Take 75 mcg by mouth daily before breakfast.         . Omega-3 Fatty Acids (FISH OIL) 1000 MG CAPS   Oral   Take 1,000 mg by mouth daily.         Marland Kitchen omeprazole (PRILOSEC) 20 MG capsule   Oral   Take 20 mg by mouth as needed.          . potassium chloride (K-DUR,KLOR-CON) 10 MEQ tablet   Oral   Take 1 tablet (10 mEq total) by mouth 2 (two) times daily as needed.   60 tablet   6   . rivaroxaban (XARELTO) 20 MG TABS tablet   Oral   Take 1 tablet (20 mg total) by mouth daily with supper.   90 tablet   3     Allergies Belladonna alkaloids  Family History  Problem Relation Age of Onset  . CAD Father   . Heart disease Father   . Heart attack Father   . Arrhythmia Sister     Social  History Social History  Substance Use Topics  . Smoking status: Former Smoker -- 1.50 packs/day for 50 years    Types: Cigarettes    Quit date: 09/18/2002  . Smokeless tobacco: Never Used  . Alcohol Use: No    Review of Systems   Constitutional: No fever/chills Eyes: No visual changes. ENT: No congestion or rhinorrhea Cardiovascular: Denies chest pain. Respiratory: Denies shortness of breath. Gastrointestinal: No abdominal pain.  No nausea, no vomiting.  No diarrhea.  No constipation. Genitourinary: Negative for dysuria. Musculoskeletal: Negative for back pain. Skin: Bleeding from right knee Neurological: Negative for headaches, focal weakness or numbness.  10-point ROS otherwise negative.  ____________________________________________   PHYSICAL EXAM:  VITAL SIGNS: ED Triage Vitals  Enc Vitals Group     BP 08/23/15 1554 138/58 mmHg     Pulse Rate 08/23/15 1554 64     Resp 08/23/15 1554 18     Temp 08/23/15 1554 97.4 F (36.3 C)     Temp Source 08/23/15 1554 Oral     SpO2 08/23/15 1554 97 %     Weight 08/23/15 1554 170 lb (77.111 kg)     Height 08/23/15 1554 $RemoveBefor'5\' 3"'[id number]$  (1.6 m)     Head Cir --      Peak Flow --      Pain Score 08/23/15 1556 0     Pain Loc --      Pain Edu? --      Excl. in [place]? --    Constitutional: Alert and oriented. Well appearing and in no acute distress. Eyes: Conjunctivae are normal. PERRL. EOMI. Head: Atraumatic. Nose: No congestion/rhinnorhea. Mouth/Throat: Mucous membranes are moist.  Oropharynx non-erythematous. No oral lesions. Neck: No stridor. Cardiovascular: Normal rate, regular rhythm.  Good peripheral circulation. Respiratory: Normal respiratory effort.  No retractions. Lungs CTAB. Gastrointestinal: Soft and nontender. No distention. No abdominal bruits.  Musculoskeletal: No lower extremity tenderness nor edema.  No joint effusions. Neurologic:  Normal speech and language. No gross focal neurologic deficits are appreciated. Speech  is normal. No gait instability. Skin:  1cm skin tear to the right knee with continuous oozing of bright red blood; Negative for petechiae.  Psychiatric: Mood and affect are normal. Speech and behavior are normal.  ____________________________________________   LABS (all labs ordered are listed, but only abnormal results are displayed)  Labs Reviewed - No data to display ____________________________________________  EKG   ____________________________________________  RADIOLOGY   ____________________________________________  PROCEDURES  Procedure(s) performed:   Silver Nitrate stick used to cauterize capillary oozing.  Monitored for about 30 minutes without return of bleeding. 4x4 pressure dressing applied ____________________________________________   INITIAL IMPRESSION / ASSESSMENT AND PLAN / ED COURSE  Pertinent labs & imaging results that were available during my care of the patient were reviewed by me and considered in my medical decision making (see chart for details).  Patient also reports that occasionally she will have some bleeding from her gums. She was advised to schedule a follow up with whomever prescribes her Xarelto, which is likely her cardiologist.  She was advised to keep the dressing in place for 24 hours unless it becomes saturated. She was advised to limit her activity today. She was advised to return to the ER if bleeding returns and she is unable to control it with pressure.  ____________________________________________   FINAL CLINICAL IMPRESSION(S) / ED DIAGNOSES  Final diagnoses:  Capillary hemorrhage  Skin tear of lower leg without complication, right, initial encounter       Victorino Dike, FNP 08/23/15 [place], MD 08/24/15 [phone]

## 2015-08-23 NOTE — ED Notes (Signed)
States has "pin hole" size wound  R knee x 2 days, continues to ooze blood. Pt on Xaralto.

## 2015-08-24 ENCOUNTER — Emergency Department
Admission: EM | Admit: 2015-08-24 | Discharge: 2015-08-24 | Disposition: A | Discharge status: Home / Self Care | Payer: Medicare Other | Attending: Emergency Medicine | Admitting: Emergency Medicine

## 2015-08-24 DIAGNOSIS — S81811D Laceration without foreign body, right lower leg, subsequent encounter: Secondary | ICD-10-CM

## 2015-08-24 DIAGNOSIS — Z87891 Personal history of nicotine dependence: Secondary | ICD-10-CM | POA: Insufficient documentation

## 2015-08-24 DIAGNOSIS — Z79899 Other long term (current) drug therapy: Secondary | ICD-10-CM | POA: Insufficient documentation

## 2015-08-24 DIAGNOSIS — N182 Chronic kidney disease, stage 2 (mild): Secondary | ICD-10-CM | POA: Diagnosis not present

## 2015-08-24 DIAGNOSIS — I788 Other diseases of capillaries: Secondary | ICD-10-CM | POA: Insufficient documentation

## 2015-08-24 DIAGNOSIS — Z7951 Long term (current) use of inhaled steroids: Secondary | ICD-10-CM | POA: Insufficient documentation

## 2015-08-24 DIAGNOSIS — S81801D Unspecified open wound, right lower leg, subsequent encounter: Secondary | ICD-10-CM | POA: Insufficient documentation

## 2015-08-24 DIAGNOSIS — M25561 Pain in right knee: Secondary | ICD-10-CM | POA: Diagnosis present

## 2015-08-24 DIAGNOSIS — I129 Hypertensive chronic kidney disease with stage 1 through stage 4 chronic kidney disease, or unspecified chronic kidney disease: Secondary | ICD-10-CM | POA: Diagnosis not present

## 2015-08-24 DIAGNOSIS — X58XXXD Exposure to other specified factors, subsequent encounter: Secondary | ICD-10-CM | POA: Diagnosis not present

## 2015-08-24 MED ORDER — HYDROCODONE-ACETAMINOPHEN 5-325 MG PO TABS: 1.0000 | ORAL_TABLET | ORAL | Status: DC | PRN

## 2015-08-24 NOTE — ED Provider Notes (Signed)
[facility] Emergency Department Provider Note  ____________________________________________  Time seen: Approximately 3:57 PM  I have reviewed the triage vital signs and the nursing notes.   HISTORY  Chief Complaint Knee Pain    HPI Toni [name] is a 80 y.o. female tense for reevaluation of a skin tear to her right knee has been bleeding for 4 days. Patient reports that she was seen yesterday and had her knee cauterized with the bleeding resume last night and has bleeding through the gauze. Here for recheck.Patient currently on Xarelto which increases her bleeding times.   Past Medical History  Diagnosis Date  . Cataract   . COPD (chronic obstructive pulmonary disease) ([place])   . Essential hypertension, benign   . Hypothyroidism   . Atypical atrial flutter ([place]) 11/16/2013  . Arrhythmia   . A-fib ([place])   . Chronic diastolic heart failure ([place])   . Hypokalemia   . Lung nodule     Followed by Dr. Raul Del  . 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 Lung cancer ([place])   . Collagen vascular disease [facility])     Patient Active Problem List   Diagnosis Date Noted  . Lung infiltrate on CT 06/08/2015  . Chronic diastolic CHF (congestive heart failure) ([place]) 05/17/2015  . Insomnia 04/17/2014  . Squamous cell lung cancer ([place]) 03/27/2014  . Postnasal drip 03/05/2014  . Hyperlipidemia 12/04/2013  . Leg edema 12/04/2013  . Atypical atrial flutter ([place]) 11/16/2013  . COPD (chronic obstructive pulmonary disease) ([place]) 11/15/2013  . Chronic kidney disease 08/24/2013  . Essential (primary) hypertension 08/24/2013  . Adult hypothyroidism 08/24/2013  . Acute exacerbation of chronic obstructive airways disease ([place]) 08/24/2013    Past Surgical History  Procedure Laterality Date  . Eye surgery    . Total knee arthroplasty Bilateral     Current Outpatient Rx  Name  Route  Sig  Dispense  Refill  . albuterol (PROVENTIL HFA;VENTOLIN HFA)  108 (90 BASE) MCG/ACT inhaler   Inhalation   Inhale 1-2 puffs into the lungs every 6 (six) hours as needed for wheezing or shortness of breath.   1 Inhaler   12   . budesonide (PULMICORT) 0.5 MG/2ML nebulizer solution   Nebulization   Take 2 mLs (0.5 mg total) by nebulization 2 (two) times daily.   120 mL   11   . diltiazem (TIAZAC) 300 MG 24 hr capsule   Oral   Take 1 capsule (300 mg total) by mouth daily.   30 capsule   11   . flecainide (TAMBOCOR) 100 MG tablet   Oral   Take 1 tablet (100 mg total) by mouth 2 (two) times daily.   60 tablet   11   . formoterol (PERFOROMIST) 20 MCG/2ML nebulizer solution   Nebulization   Take 2 mLs (20 mcg total) by nebulization 2 (two) times daily.   2 mL   12   . furosemide (LASIX) 40 MG tablet   Oral   Take 1 tablet (40 mg total) by mouth 2 (two) times daily as needed. Patient taking differently: Take 40 mg by mouth daily.    60 tablet   6   . HYDROcodone-acetaminophen (NORCO) 5-325 MG tablet   Oral   Take 1-2 tablets by mouth every 4 (four) hours as needed for moderate pain.   15 tablet   0   . ipratropium-albuterol (DUONEB) 0.5-2.5 (3) MG/3ML SOLN   Nebulization   Take 3 mLs  by nebulization daily as needed.   90 mL   6   . levothyroxine (SYNTHROID, LEVOTHROID) 75 MCG tablet   Oral   Take 75 mcg by mouth daily before breakfast.         . Omega-3 Fatty Acids (FISH OIL) 1000 MG CAPS   Oral   Take 1,000 mg by mouth daily.         Marland Kitchen omeprazole (PRILOSEC) 20 MG capsule   Oral   Take 20 mg by mouth as needed.          . potassium chloride (K-DUR,KLOR-CON) 10 MEQ tablet   Oral   Take 1 tablet (10 mEq total) by mouth 2 (two) times daily as needed.   60 tablet   6   . rivaroxaban (XARELTO) 20 MG TABS tablet   Oral   Take 1 tablet (20 mg total) by mouth daily with supper.   90 tablet   3     Allergies Belladonna alkaloids  Family History  Problem Relation Age of Onset  . CAD Father   . Heart disease  Father   . Heart attack Father   . Arrhythmia Sister     Social History Social History  Substance Use Topics  . Smoking status: Former Smoker -- 1.50 packs/day for 50 years    Types: Cigarettes    Quit date: 09/18/2002  . Smokeless tobacco: Never Used  . Alcohol Use: No    Review of Systems Constitutional: No fever/chills Eyes: No visual changes. ENT: No sore throat. Cardiovascular: Denies chest pain. Respiratory: Denies shortness of breath. Gastrointestinal: No abdominal pain.  No nausea, no vomiting.  No diarrhea.  No constipation. Genitourinary: Negative for dysuria. Musculoskeletal: Negative for back pain. Skin: Positive for skin tear to the right anterior knee. Neurological: Negative for headaches, focal weakness or numbness.  10-point ROS otherwise negative.  ____________________________________________   PHYSICAL EXAM:  VITAL SIGNS: ED Triage Vitals  Enc Vitals Group     BP 08/24/15 1419 133/58 mmHg     Pulse Rate 08/24/15 1419 69     Resp 08/24/15 1419 18     Temp 08/24/15 1419 98.1 F (36.7 C)     Temp src --      SpO2 08/24/15 1419 96 %     Weight 08/24/15 1419 170 lb (77.111 kg)     Height 08/24/15 1419 5' 3"  (1.6 m)     Head Cir --      Peak Flow --      Pain Score 08/24/15 1419 6     Pain Loc --      Pain Edu? --      Excl. in [place]? --     Constitutional: Alert and oriented. Well appearing and in no acute distress.  Cardiovascular: Normal rate, regular rhythm. Grossly normal heart sounds.  Good peripheral circulation. Respiratory: Normal respiratory effort.  No retractions. Lungs CTAB. Musculoskeletal: No lower extremity tenderness nor edema.  No joint effusions. Neurologic:  Normal speech and language. No gross focal neurologic deficits are appreciated. No gait instability. Skin:  Skin is warm, dry with superficial skin tear noted to the dorsal aspect of the right knee. Psychiatric: Mood and affect are normal. Speech and behavior are  normal.  ____________________________________________   LABS (all labs ordered are listed, but only abnormal results are displayed)  Labs Reviewed - No data to display ____________________________________________   PROCEDURES  Procedure(s) performed: None  Critical Care performed: No  ____________________________________________   INITIAL IMPRESSION /  ASSESSMENT AND PLAN / ED COURSE  Pertinent labs & imaging results that were available during my care of the patient were reviewed by me and considered in my medical decision making (see chart for details).  Capillary hemorrhage in the skin tear to the right lower leg. Placed Surgicel and direct pressure over to the anterior knee wrapped with Telfa gauze and Ace wrap. Recheck in 24 hours or return sooner if needed. Patient tolerated procedure well voices no other complaints at this time. Rx given for hydrocodone for pain. ____________________________________________   FINAL CLINICAL IMPRESSION(S) / ED DIAGNOSES  Final diagnoses:  Capillary hemorrhage  Skin tear of lower leg without complication, right, subsequent encounter      Arlyss Repress, PA-C 08/24/15 1600  Eula Listen, MD 08/24/15 2002

## 2015-08-24 NOTE — ED Notes (Signed)
Pt seen yesterday for a skin tear to right knee that had been bleeding for several days; pt reports waking up this morning to shooting pains down right leg from ankle to hip, noticed knee had bled through the ace wrap which was applied yesterday.  Dry blood noted on ace wrap.

## 2015-08-24 NOTE — ED Notes (Signed)
Pt seen here yesterday for small area bleeding just above right knee; skin tear to area that was continually bleeding for several days; bleeding stopped yesterday by provider and wrapped with ace wrap; pt was told not to remove ace wrap until 5pm today; also told to return if bleeding resumed; pt now with area of dried blood noted to outside of ace wrap; pt say she had pain for the last 2 days, now with just "little shooting pain" to area;

## 2015-09-01 ENCOUNTER — Telehealth: Payer: Self-pay

## 2015-09-01 NOTE — Telephone Encounter (Signed)
Dr. Rockey Situ received call from Lorenso Quarry, NP w/ [facility].  Per Dr. Rockey Situ, he is agreeable w/ pt holding Xarelto 2/2 anemia, significant bruising and Hgb of 8.

## 2015-09-27 ENCOUNTER — Ambulatory Visit: Payer: Medicare Other

## 2015-09-30 ENCOUNTER — Inpatient Hospital Stay: Payer: Medicare Other | Admitting: Oncology

## 2015-09-30 ENCOUNTER — Inpatient Hospital Stay: Payer: Medicare Other

## 2015-10-01 ENCOUNTER — Ambulatory Visit (INDEPENDENT_AMBULATORY_CARE_PROVIDER_SITE_OTHER): Payer: Medicare Other | Admitting: Nurse Practitioner

## 2015-10-01 ENCOUNTER — Encounter: Payer: Self-pay | Admitting: Nurse Practitioner

## 2015-10-01 VITALS — BP 120/74 | HR 64 | Ht 63.0 in | Wt 168.8 lb

## 2015-10-01 DIAGNOSIS — I484 Atypical atrial flutter: Secondary | ICD-10-CM | POA: Diagnosis not present

## 2015-10-01 DIAGNOSIS — I5032 Chronic diastolic (congestive) heart failure: Secondary | ICD-10-CM | POA: Diagnosis not present

## 2015-10-01 DIAGNOSIS — I480 Paroxysmal atrial fibrillation: Secondary | ICD-10-CM | POA: Diagnosis not present

## 2015-10-01 DIAGNOSIS — I10 Essential (primary) hypertension: Secondary | ICD-10-CM | POA: Insufficient documentation

## 2015-10-01 MED ORDER — APIXABAN 5 MG PO TABS: 5.0000 mg | ORAL_TABLET | Freq: Two times a day (BID) | ORAL | Status: DC

## 2015-10-01 NOTE — Patient Instructions (Signed)
Medication Instructions:  Please start Eliquis '5mg'$  twice daily  Labwork: Please come in next week for BMET & CBC  Date & time: _________________  Testing/Procedures: None  Follow-Up: Your physician wants you to follow-up in: 6 months w/ Dr. Cathie Hoops will receive a reminder letter in the mail two months in advance.  If you don't receive a letter, please call our office to schedule the follow-up appointment.   If you need a refill on your cardiac medications before your next appointment, please call your pharmacy.

## 2015-10-01 NOTE — Progress Notes (Signed)
Office Visit    Patient Name: Toni [name] Date of Encounter: 10/01/2015  Primary Care Provider:  Tomma Rakers, PA-C Primary Cardiologist:  Johnny Bridge, MD   Chief Complaint    81 year old female with a history of paroxysmal atrial flutter and fibrillation who presents for follow-up.  Past Medical History    Past Medical History  Diagnosis Date  . Cataract   . COPD (chronic obstructive pulmonary disease) ([place])   . Essential hypertension, benign   . Hypothyroidism   . Atypical atrial flutter ([place]) 11/16/2013    a. CHA2DS2VASc = 5-->prev on xarelto, d/c'd due to bleeding skin tear;  c. 11/2013->Placed on flecainide.  . Chronic diastolic heart failure ([place])   . PAF (paroxysmal atrial fibrillation) ([place])     a. 11/2013 Echo: EF 55-60%, mild AS/MR, mod dil LA/RA, PASP 53mHg;  b. CHA2DS2VASc = 5-->prev on xarelto, d/c'd due to bleeding skin tear;  c. 11/2013->Placed on flecainide.  . Lung nodule     Followed by Dr. FRaul Del . 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 Lung cancer ([place]   . Collagen vascular disease ([place]   . Hypokalemia   . Chronic diastolic CHF (congestive heart failure) ([place]     a. 11/2013 Echo: EF 55-60%, mild AS/MR, mod dil LA/RA, PASP 342mg.   Past Surgical History  Procedure Laterality Date  . Eye surgery    . Total knee arthroplasty Bilateral     Allergies  Allergies  Allergen Reactions  . Belladonna Alkaloids     "made eyes cross"     History of Present Illness    [age]ear old female with the above complex past medical history. She has a history of paroxysmal atrial flutter and fibrillation dating back to December 2014. In March 2015, she was placed on flecainide therapy and also started on xarelto. She subsequently converted to sinus rhythm. She is asymptomatic when she experiences A. fib and flutter. Previous exacerbations appear to occur in the setting of respiratory illness. Though she has been on xarelto, in December  2016, she developed skin tears on her knees with a fair amount of bleeding and subsequent anemia. Her primary care provider discontinued xarelto. These wounds have since healed and overall, she has done well. She denies chest pain, dyspnea, PND, orthopnea, dizziness, syncope, edema, or early satiety. She has no history of falls and further denies any dark stools or blood in her stools. She would be interested in getting back on oral anticoagulation.  Home Medications    Prior to Admission medications   Medication Sig Start Date End Date Taking? Authorizing Provider  albuterol (PROVENTIL HFA;VENTOLIN HFA) 108 (90 BASE) MCG/ACT inhaler Inhale 1-2 puffs into the lungs every 6 (six) hours as needed for wheezing or shortness of breath. 06/08/15  Yes KuFlora LippsMD  budesonide (PULMICORT) 0.5 MG/2ML nebulizer solution Take 2 mLs (0.5 mg total) by nebulization 2 (two) times daily. 06/08/15  Yes KuFlora LippsMD  diltiazem (TIAZAC) 300 MG 24 hr capsule Take 1 capsule (300 mg total) by mouth daily. 11/24/13  Yes ElFrazier RichardsMD  flecainide (TAMBOCOR) 100 MG tablet Take 1 tablet (100 mg total) by mouth 2 (two) times daily. 11/24/13  Yes ElFrazier RichardsMD  formoterol (PERFOROMIST) 20 MCG/2ML nebulizer solution Take 2 mLs (20 mcg total) by nebulization 2 (two) times daily. 06/08/15  Yes KuFlora LippsMD  ipratropium-albuterol (DUONEB) 0.5-2.5 (3) MG/3ML SOLN Take 3 mLs by nebulization daily as  needed. 06/08/15  Yes Flora Lipps, MD  levothyroxine (SYNTHROID, LEVOTHROID) 75 MCG tablet Take 75 mcg by mouth daily before breakfast.   Yes Historical Provider, MD  Omega-3 Fatty Acids (FISH OIL) 1000 MG CAPS Take 1,000 mg by mouth daily.   Yes Historical Provider, MD  omeprazole (PRILOSEC) 20 MG capsule Take 20 mg by mouth as needed (as needed for heartburn).    Yes Historical Provider, MD  potassium chloride (K-DUR,KLOR-CON) 10 MEQ tablet Take 1 tablet (10 mEq total) by mouth 2 (two) times daily as needed. Patient taking  differently: Take 10 mEq by mouth 2 (two) times daily as needed.  05/17/15  Yes Minna Merritts, MD  apixaban (ELIQUIS) 5 MG TABS tablet Take 1 tablet (5 mg total) by mouth 2 (two) times daily. 10/01/15   Rogelia Mire, NP  furosemide (LASIX) 40 MG tablet Take 1 tablet (40 mg total) by mouth 2 (two) times daily as needed. Patient not taking: Reported on 10/01/2015 05/17/15   Minna Merritts, MD  HYDROcodone-acetaminophen (NORCO) 5-325 MG tablet Take 1-2 tablets by mouth every 4 (four) hours as needed for moderate pain. Patient not taking: Reported on 10/01/2015 08/24/15   Arlyss Repress, PA-C    Review of Systems    Some degree of chronic DOE.  She had trouble with skin tears on her knees with related bleeding in early Dec.  This has resolved.  She denies chest pain, palpitations, pnd, orthopnea, n, v, dizziness, syncope, edema, weight gain, or early satiety .  All other systems reviewed and are otherwise negative except as noted above.  Physical Exam    VS:  BP 120/74 mmHg  Pulse 64  Ht 5' 3"  (1.6 m)  Wt 168 lb 12.8 oz (76.567 kg)  BMI 29.91 kg/m2 , BMI Body mass index is 29.91 kg/(m^2). GEN: Well nourished, well developed, in no acute distress. HEENT: normal. Neck: Supple, no JVD, carotid bruits, or masses. Cardiac: RRR, rubs, or gallops. 3/6 SEM RUSB, 2/6 syst murmur @ LLSB [place]. No clubbing, cyanosis, edema.  Radials/DP/PT 2+ and equal bilaterally.  Respiratory:  Respirations regular and unlabored, diminished breath sounds bilat bilaterally. GI: Soft, nontender, nondistended, BS + x 4. MS: no deformity or atrophy. Skin: warm and dry, no rash. Neuro:  Strength and sensation are intact. Psych: Normal affect.  Accessory Clinical Findings    ECG - Sinus Rhythm, 63, wide first degree AVB, no acute st/t changes, no significant qrs widening no change since last ECG.  Assessment & Plan    1.  Paroxysmal atrial fibrillation and flutter: Patient is maintaining sinus rhythm with  first-degree AV block today. She has not had any known recurrence of A. fib or flutter, though it is noted that she has been a symptomatically the past. She was recently taken off of xarelto secondary to skin tears with bleeding and drop in hemoglobin. Skin tears have since healed. She denies any history of falls or GI bleeding. She would like to resume oral anticoagulation, and this is advisable in the setting of a CHA2DS2VASc of 5. As she had bleeding issues of xarelto, we will try eliquis 5 mg twice a day. I will arrange for follow-up CBC and basic metabolic panel in 1 week. Continue flecainide and diltiazem.  2. Essential hypertension: Blood pressure is stable on diltiazem therapy. No changes today.  3. Chronic diastolic congestive heart failure: Volume is stable. She has not been having issues with dyspnea, PND, orthopnea, or edema. Heart rate and blood  pressure well controlled.  4. COPD: She is not actively wheezing. She is on inhalers at home.  5. Disposition: Follow-up labs in one week. Follow-up with Dr. Rockey Situ in 6 months or sooner if necessary.   Murray Hodgkins, NP 10/01/2015, 10:39 AM

## 2015-10-04 ENCOUNTER — Ambulatory Visit: Payer: Medicare Other | Admitting: Internal Medicine

## 2015-10-04 ENCOUNTER — Telehealth: Payer: Self-pay

## 2015-10-04 NOTE — Telephone Encounter (Signed)
Eliquis approved through [facility] script through 10/03/2018.

## 2015-10-05 ENCOUNTER — Telehealth: Payer: Self-pay

## 2015-10-05 NOTE — Telephone Encounter (Signed)
Pt would like an anlternative for Eliquis, states it is too expensive.please call. Pt had an appt for 1/20, she did cancel this, due to she has not gotten the medication.

## 2015-10-05 NOTE — Telephone Encounter (Signed)
Pt appt on 1/20 was lab appt

## 2015-10-05 NOTE — Telephone Encounter (Signed)
As she had what sounds like pretty significant bleeding related to skin tears while on xarelto, we specifically chose eliquis b/c of the reduced risk of bleeding.  If cost is an issue, please look into the pt assistance program, though she probably doesn't qualify since she has medicare.  Samples are another option though she needs to understand that samples are unreliable as many pts request them.  If we're out of options, she can continue off of oral anticoagulation, so long as she understands her stroke risk remains.  I wouldn't consider coumadin given bleeding risk.

## 2015-10-05 NOTE — Telephone Encounter (Signed)
Spoke w/ pt.  Advised her of Chris's recommendation.  She states that her pharmacist only gave her the cash price, did not run Eliquis thru her ins. Advised her that her cost will be significantly less w/ her ins, GoodRx shows typical copay range is $15-$184. She will wait until she hears back from her pharmacy before making any changes.

## 2015-10-06 ENCOUNTER — Ambulatory Visit
Admission: RE | Admit: 2015-10-06 | Discharge: 2015-10-06 | Disposition: A | Discharge status: Home / Self Care | Payer: Medicare Other | Source: Ambulatory Visit | Attending: Oncology | Admitting: Oncology

## 2015-10-06 DIAGNOSIS — C3492 Malignant neoplasm of unspecified part of left bronchus or lung: Secondary | ICD-10-CM | POA: Diagnosis present

## 2015-10-06 DIAGNOSIS — I700 Atherosclerosis of aorta: Secondary | ICD-10-CM | POA: Insufficient documentation

## 2015-10-06 DIAGNOSIS — C3490 Malignant neoplasm of unspecified part of unspecified bronchus or lung: Secondary | ICD-10-CM

## 2015-10-06 DIAGNOSIS — R918 Other nonspecific abnormal finding of lung field: Secondary | ICD-10-CM | POA: Insufficient documentation

## 2015-10-06 DIAGNOSIS — Z923 Personal history of irradiation: Secondary | ICD-10-CM | POA: Insufficient documentation

## 2015-10-06 DIAGNOSIS — R911 Solitary pulmonary nodule: Secondary | ICD-10-CM | POA: Diagnosis present

## 2015-10-07 ENCOUNTER — Inpatient Hospital Stay (HOSPITAL_BASED_OUTPATIENT_CLINIC_OR_DEPARTMENT_OTHER): Payer: Medicare Other | Admitting: Oncology

## 2015-10-07 ENCOUNTER — Encounter: Payer: Self-pay | Admitting: Oncology

## 2015-10-07 ENCOUNTER — Inpatient Hospital Stay: Payer: Medicare Other | Attending: Oncology

## 2015-10-07 VITALS — BP 144/70 | HR 68 | Temp 97.3°F | Resp 18 | Wt 171.0 lb

## 2015-10-07 DIAGNOSIS — Z79899 Other long term (current) drug therapy: Secondary | ICD-10-CM

## 2015-10-07 DIAGNOSIS — Z87891 Personal history of nicotine dependence: Secondary | ICD-10-CM | POA: Insufficient documentation

## 2015-10-07 DIAGNOSIS — E876 Hypokalemia: Secondary | ICD-10-CM | POA: Diagnosis not present

## 2015-10-07 DIAGNOSIS — I2510 Atherosclerotic heart disease of native coronary artery without angina pectoris: Secondary | ICD-10-CM | POA: Insufficient documentation

## 2015-10-07 DIAGNOSIS — Z7901 Long term (current) use of anticoagulants: Secondary | ICD-10-CM | POA: Insufficient documentation

## 2015-10-07 DIAGNOSIS — I10 Essential (primary) hypertension: Secondary | ICD-10-CM | POA: Insufficient documentation

## 2015-10-07 DIAGNOSIS — I480 Paroxysmal atrial fibrillation: Secondary | ICD-10-CM | POA: Insufficient documentation

## 2015-10-07 DIAGNOSIS — C3431 Malignant neoplasm of lower lobe, right bronchus or lung: Secondary | ICD-10-CM | POA: Insufficient documentation

## 2015-10-07 DIAGNOSIS — E039 Hypothyroidism, unspecified: Secondary | ICD-10-CM | POA: Diagnosis not present

## 2015-10-07 DIAGNOSIS — N182 Chronic kidney disease, stage 2 (mild): Secondary | ICD-10-CM | POA: Insufficient documentation

## 2015-10-07 DIAGNOSIS — I484 Atypical atrial flutter: Secondary | ICD-10-CM | POA: Diagnosis not present

## 2015-10-07 DIAGNOSIS — Z923 Personal history of irradiation: Secondary | ICD-10-CM | POA: Insufficient documentation

## 2015-10-07 DIAGNOSIS — I5032 Chronic diastolic (congestive) heart failure: Secondary | ICD-10-CM | POA: Diagnosis not present

## 2015-10-07 DIAGNOSIS — C3490 Malignant neoplasm of unspecified part of unspecified bronchus or lung: Secondary | ICD-10-CM

## 2015-10-07 DIAGNOSIS — C3402 Malignant neoplasm of left main bronchus: Secondary | ICD-10-CM | POA: Diagnosis not present

## 2015-10-07 DIAGNOSIS — J449 Chronic obstructive pulmonary disease, unspecified: Secondary | ICD-10-CM | POA: Insufficient documentation

## 2015-10-07 LAB — CBC WITH DIFFERENTIAL/PLATELET
BASOS PCT: 1 %
Basophils Absolute: 0.1 10*3/uL (ref 0–0.1)
EOS ABS: 0.2 10*3/uL (ref 0–0.7)
EOS PCT: 2 %
HCT: 35.6 % (ref 35.0–47.0)
HEMOGLOBIN: 11.7 g/dL — AB (ref 12.0–16.0)
Lymphocytes Relative: 13 %
Lymphs Abs: 1.1 10*3/uL (ref 1.0–3.6)
MCH: 28 pg (ref 26.0–34.0)
MCHC: 32.7 g/dL (ref 32.0–36.0)
MCV: 85.8 fL (ref 80.0–100.0)
MONOS PCT: 8 %
Monocytes Absolute: 0.6 10*3/uL (ref 0.2–0.9)
NEUTROS PCT: 76 %
Neutro Abs: 6.2 10*3/uL (ref 1.4–6.5)
PLATELETS: 311 10*3/uL (ref 150–440)
RBC: 4.15 MIL/uL (ref 3.80–5.20)
RDW: 15.3 % — ABNORMAL HIGH (ref 11.5–14.5)
WBC: 8.2 10*3/uL (ref 3.6–11.0)

## 2015-10-07 LAB — COMPREHENSIVE METABOLIC PANEL
ALBUMIN: 3.6 g/dL (ref 3.5–5.0)
ALK PHOS: 94 U/L (ref 38–126)
ALT: 10 U/L — ABNORMAL LOW (ref 14–54)
ANION GAP: 8 (ref 5–15)
AST: 12 U/L — ABNORMAL LOW (ref 15–41)
BUN: 24 mg/dL — ABNORMAL HIGH (ref 6–20)
CALCIUM: 9 mg/dL (ref 8.9–10.3)
CHLORIDE: 99 mmol/L — AB (ref 101–111)
CO2: 28 mmol/L (ref 22–32)
Creatinine, Ser: 1 mg/dL (ref 0.44–1.00)
GFR calc Af Amer: >60 mL/min (ref >60)
GFR calc non Af Amer: 53 mL/min — ABNORMAL LOW (ref >60)
GLUCOSE: 145 mg/dL — AB (ref 65–99)
Potassium: 3.3 mmol/L — ABNORMAL LOW (ref 3.5–5.1)
SODIUM: 135 mmol/L (ref 135–145)
Total Bilirubin: 0.5 mg/dL (ref 0.3–1.2)
Total Protein: 7.4 g/dL (ref 6.5–8.1)

## 2015-10-07 NOTE — Progress Notes (Signed)
[place] @ [facility] [phone]  Fax:([phone]) [place]: 02-16-1935  MR#: [id number]  YYT#:[id number]  Patient Care Team: Tomma Rakers, PA-C as PCP - General (Physician Assistant) Minna Merritts, MD as Consulting Physician (Cardiology)  CHIEF COMPLAINT:  Chief Complaint  Patient presents with  . Lung Cancer   1.squamous cell carcinoma for lung T1, N0, M0 tumor.  (July, 2015) There are other 2 smaller nodules etiology of which is not clear STATUS POST STEREOTACTIC RADIATION THERAPY(sEPTEMBER, 2015)   Oncology Flowsheet 11/19/2013 11/19/2013 11/20/2013 11/21/2013 11/22/2013 11/23/2013 11/24/2013  ALPRAZolam (XANAX) PO - - - - - - -  methylPREDNISolone sodium succinate 125 mg/2 mL (SOLU-MEDROL) IV 60 mg 60 mg 60 mg - - - -  predniSONE (DELTASONE) PO - - 40 mg 40 mg 30 mg 30 mg 30 mg    INTERVAL HISTORY: 81 year old lady came today further follow-up.  Patient had been to emergency room in [place] with the acute onset of shortness of breath.  Review of the record shows blunting of the diaphragm right pleural effusion patient received intravenous Lasix resulting in to diuresis of 3 L of fluid. Shortness of breath is improved. She had a repeat CT scan and here for further follow-up today.  She has an appointment to see pulmonologist as well as being followed by cardiologist..  No chest pain.  No shortness of breath.  Appetite has been stable.. .   REVIEW OF SYSTEMS:    general status: Patient is feeling weak and tired.  No change in a performance status.  No chills.  No fever. HEENT: No evidence of stomatitis Lungs: No cough or shortness of breath See present date to emergency room with history of acute shortness of breath Had a chest x-ray which has been reviewed.  Had right pleural effusion. Cardiac: No chest pain or paroxysmal nocturnal dyspnea GI: No nausea no vomiting no diarrhea no abdominal pain Skin: No rash Lower extremity no  swelling Neurological system: No tingling.  No numbness.  No other focal signs Musculoskeletal system no bony pains  As per HPI. Otherwise, a complete review of systems is negatve.  PAST MEDICAL HISTORY: Past Medical History  Diagnosis Date  . Cataract   . COPD (chronic obstructive pulmonary disease) ([place])   . Essential hypertension, benign   . Hypothyroidism   . Atypical atrial flutter ([place]) 11/16/2013    a. CHA2DS2VASc = 5-->prev on xarelto, d/c'd due to bleeding skin tear;  c. 11/2013->Placed on flecainide.  . Chronic diastolic heart failure ([employer])   . PAF (paroxysmal atrial fibrillation) ([place])     a. 11/2013 Echo: EF 55-60%, mild AS/MR, mod dil LA/RA, PASP 28mHg;  b. CHA2DS2VASc = 5-->prev on xarelto, d/c'd due to bleeding skin tear;  c. 11/2013->Placed on flecainide.  . Lung nodule     Followed by Dr. FRaul Del . 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 Lung cancer ([place]   . Collagen vascular disease ([place]   . Hypokalemia   . Chronic diastolic CHF (congestive heart failure) ([place]     a. 11/2013 Echo: EF 55-60%, mild AS/MR, mod dil LA/RA, PASP 356mg.  Significant History/PMH:   COPD:    Hypertension:    Hypothyroidism:    Atrial Fibrillation:   Smoking History: Smoking History 1.5 Packs per day and x 32 years. Smoking Cessation Information Given to Patient .  PFSH: Family History: noncontributory  Social History: negative alcohol  Additional Past  Medical and Surgical History: COPD using oxygen at nighttime  Atrial fibrillation.  hypertension  Hypothyroidism  Chronic diastolic heart failure  Hypokalemia  cataract    PAST SURGICAL HISTORY: Past Surgical History  Procedure Laterality Date  . Eye surgery    . Total knee arthroplasty Bilateral     FAMILY HISTORY Family History  Problem Relation Age of Onset  . CAD Father   . Heart disease Father   . Heart attack Father   . Arrhythmia Sister     ADVANCED DIRECTIVES:  No flowsheet data  found.  HEALTH MAINTENANCE: Social History  Substance Use Topics  . Smoking status: Former Smoker -- 1.50 packs/day for 50 years    Types: Cigarettes    Quit date: 09/18/2002  . Smokeless tobacco: Never Used  . Alcohol Use: No      Allergies  Allergen Reactions  . Belladonna Alkaloids     "made eyes cross"     Current Outpatient Prescriptions  Medication Sig Dispense Refill  . albuterol (PROVENTIL HFA;VENTOLIN HFA) 108 (90 BASE) MCG/ACT inhaler Inhale 1-2 puffs into the lungs every 6 (six) hours as needed for wheezing or shortness of breath. 1 Inhaler 12  . apixaban (ELIQUIS) 5 MG TABS tablet Take 1 tablet (5 mg total) by mouth 2 (two) times daily. 60 tablet 6  . budesonide (PULMICORT) 0.5 MG/2ML nebulizer solution Take 2 mLs (0.5 mg total) by nebulization 2 (two) times daily. 120 mL 11  . diltiazem (TIAZAC) 300 MG 24 hr capsule Take 1 capsule (300 mg total) by mouth daily. 30 capsule 11  . flecainide (TAMBOCOR) 100 MG tablet Take 1 tablet (100 mg total) by mouth 2 (two) times daily. 60 tablet 11  . formoterol (PERFOROMIST) 20 MCG/2ML nebulizer solution Take 2 mLs (20 mcg total) by nebulization 2 (two) times daily. 2 mL 12  . furosemide (LASIX) 40 MG tablet Take 1 tablet (40 mg total) by mouth 2 (two) times daily as needed. 60 tablet 6  . HYDROcodone-acetaminophen (NORCO) 5-325 MG tablet Take 1-2 tablets by mouth every 4 (four) hours as needed for moderate pain. 15 tablet 0  . ipratropium-albuterol (DUONEB) 0.5-2.5 (3) MG/3ML SOLN Take 3 mLs by nebulization daily as needed. 90 mL 6  . levothyroxine (SYNTHROID, LEVOTHROID) 75 MCG tablet Take 75 mcg by mouth daily before breakfast.    . Omega-3 Fatty Acids (FISH OIL) 1000 MG CAPS Take 1,000 mg by mouth daily.    Marland Kitchen omeprazole (PRILOSEC) 20 MG capsule Take 20 mg by mouth as needed (as needed for heartburn).     . potassium chloride (K-DUR,KLOR-CON) 10 MEQ tablet Take 1 tablet (10 mEq total) by mouth 2 (two) times daily as needed.  (Patient taking differently: Take 10 mEq by mouth 2 (two) times daily as needed. ) 60 tablet 6   No current facility-administered medications for this visit.    OBJECTIVE:  Filed Vitals:   10/07/15 1433  BP: 144/70  Pulse: 68  Temp: 97.3 F (36.3 C)  Resp: 18     Body mass index is 30.3 kg/(m^2).    ECOG FS:1 - Symptomatic but completely ambulatory  PHYSICAL EXAM: Gen. status: Patient is alert oriented not any acute distress Lungs: Diminished air entry on both sides bilateral rhonchi.  Cardiac: Irregular heart sounds. Abdomen: Soft.  Liver and spleen not palpable Lower extremity no edema Neurological system no localizing sign Lymphatic system: Supraclavicular, cervical, axillary, inguinal lymph nodes are not palpable Head exam was generally normal. There was no scleral  icterus or corneal arcus. Mucous membranes were moist.    LAB RESULTS:  CBC Latest Ref Rng 10/07/2015 05/31/2015  WBC 3.6 - 11.0 K/uL 8.2 5.5  Hemoglobin 12.0 - 16.0 g/dL 11.7(L) 11.0(L)  Hematocrit 35.0 - 47.0 % 35.6 33.8(L)  Platelets 150 - 440 K/uL 311 245    Appointment on 10/07/2015  Component Date Value Ref Range Status  . WBC 10/07/2015 8.2  3.6 - 11.0 K/uL Final  . RBC 10/07/2015 4.15  3.80 - 5.20 MIL/uL Final  . Hemoglobin 10/07/2015 11.7* 12.0 - 16.0 g/dL Final  . HCT 10/07/2015 35.6  35.0 - 47.0 % Final  . MCV 10/07/2015 85.8  80.0 - 100.0 fL Final  . MCH 10/07/2015 28.0  26.0 - 34.0 pg Final  . MCHC 10/07/2015 32.7  32.0 - 36.0 g/dL Final  . RDW 10/07/2015 15.3* 11.5 - 14.5 % Final  . Platelets 10/07/2015 311  150 - 440 K/uL Final  . Neutrophils Relative % 10/07/2015 76   Final  . Neutro Abs 10/07/2015 6.2  1.4 - 6.5 K/uL Final  . Lymphocytes Relative 10/07/2015 13   Final  . Lymphs Abs 10/07/2015 1.1  1.0 - 3.6 K/uL Final  . Monocytes Relative 10/07/2015 8   Final  . Monocytes Absolute 10/07/2015 0.6  0.2 - 0.9 K/uL Final  . Eosinophils Relative 10/07/2015 2   Final  . Eosinophils  Absolute 10/07/2015 0.2  0 - 0.7 K/uL Final  . Basophils Relative 10/07/2015 1   Final  . Basophils Absolute 10/07/2015 0.1  0 - 0.1 K/uL Final  . Sodium 10/07/2015 135  135 - 145 mmol/L Final  . Potassium 10/07/2015 3.3* 3.5 - 5.1 mmol/L Final  . Chloride 10/07/2015 99* 101 - 111 mmol/L Final  . CO2 10/07/2015 28  22 - 32 mmol/L Final  . Glucose, Bld 10/07/2015 145* 65 - 99 mg/dL Final  . BUN 10/07/2015 24* 6 - 20 mg/dL Final  . Creatinine, Ser 10/07/2015 1.00  0.44 - 1.00 mg/dL Final  . Calcium 10/07/2015 9.0  8.9 - 10.3 mg/dL Final  . Total Protein 10/07/2015 7.4  6.5 - 8.1 g/dL Final  . Albumin 10/07/2015 3.6  3.5 - 5.0 g/dL Final  . AST 10/07/2015 12* 15 - 41 U/L Final  . ALT 10/07/2015 10* 14 - 54 U/L Final  . Alkaline Phosphatase 10/07/2015 94  38 - 126 U/L Final  . Total Bilirubin 10/07/2015 0.5  0.3 - 1.2 mg/dL Final  . GFR calc non Af Amer 10/07/2015 53* >60 mL/min Final  . GFR calc Af Amer 10/07/2015 >60  >60 mL/min Final   Comment: (NOTE) The eGFR has been calculated using the CKD EPI equation. This calculation has not been validated in all clinical situations. eGFR's persistently <60 mL/min signify possible Chronic Kidney Disease.   . Anion gap 10/07/2015 8  5 - 15 Final       STUDIES: Ct Chest Wo Contrast  10/06/2015  CLINICAL DATA:  History of lung cancer. Radiation treatment only. Followup right lung nodule. EXAM: CT CHEST WITHOUT CONTRAST TECHNIQUE: Multidetector CT imaging of the chest was performed following the standard protocol without IV contrast. COMPARISON:  Chest CT 03/10/2014 and 05/28/2015.  PET-CT 11/19/2014 FINDINGS: Mediastinum/Nodes: No breast masses, supraclavicular or axillary lymphadenopathy. Small scattered lymph nodes are noted. The thyroid gland is grossly normal. The heart is normal in size. No pericardial effusion. Stable aortic and branch vessel atherosclerotic calcifications. Stable coronary artery calcifications. Small scattered mediastinal  and hilar lymph nodes are  unchanged. 11.5 mm precarinal lymph node is stable. The esophagus is grossly normal. There is a stable moderate-sized hiatal hernia. Lungs/Pleura: Small residual nodular density in the superior segment right lower lobe with surrounding radiation changes. This nodule measures approximately 7 mm and originally measured approximately 15.5 mm. No interval change in the 7.5 x 6.0 mm left lower lobe pulmonary nodule on image number 20. Stable underlying mild emphysematous changes. No acute pulmonary findings. No new pulmonary lesions. No pleural effusion. Bilateral lower lobe tree-in-bud appearance is new or progressive when compared to the prior CT and is most likely due to inflammation or atypical infection such as MAC. Upper abdomen: Cholelithiasis is noted. There is tortuosity, ectasia and calcification of the abdominal aorta. No focal aneurysm. The adrenal glands appear normal. Musculoskeletal: No acute bony findings. Advanced osteoporosis and stable mild compression deformities. Severe degenerative changes noted in the upper lumbar spine. IMPRESSION: 1. Small residual nodular density in the right lower lobe surrounded by radiation changes. The nodule measures a maximum of 7 mm and previously measured 15.5 mm. 2. Stable left lower lobe pulmonary nodule (7.5 x 6.0 mm). 3. Bilateral lower lobe tree-in-bud appearance likely representing inflammation or atypical infection such as MAC. 4. Small scattered stable mediastinal and hilar lymph nodes but no overt adenopathy. 5. Stable atherosclerotic changes involving the thoracic and abdominal aorta. Electronically Signed   By: Marijo Sanes M.D.   On: 10/06/2015 14:40    ASSESSMENT: 1.  Squamous cell carcinoma of lung T1 N1 M0 tumor status post radiation therapy with stereotactic surgery.  Lymph node has decreased in size left upper lobe nodule is decreased.  Left lower lobe lung nodule needs to be observed.  2.CT scan of the chest has been  reviewed.  Small residual nodular density in the right lower lobe.  Left lower lobe nodule is unchanged.  Which will be followed.  Right lower lobe nodule is what had radiation therapy.  Patient's main problem appears to be his congestive heart failure which is improved proving and under control being followed by primary care physician as well as by cardiologist. Evaluation in 6 months or before if needed with a repeat chest x-ray all lab data has been reviewed    Patient expressed understanding and was in agreement with this plan. She also understands that She can call clinic at any time with any questions, concerns, or complaints.    No matching staging information was found for the patient.  Forest Gleason, MD   10/07/2015 2:53 PM

## 2015-10-07 NOTE — Progress Notes (Signed)
Patient here for CT results.  

## 2015-10-08 ENCOUNTER — Other Ambulatory Visit: Payer: Medicare Other

## 2015-10-19 ENCOUNTER — Encounter: Payer: Self-pay | Admitting: Internal Medicine

## 2015-10-19 ENCOUNTER — Ambulatory Visit (INDEPENDENT_AMBULATORY_CARE_PROVIDER_SITE_OTHER): Payer: Medicare Other | Admitting: Internal Medicine

## 2015-10-19 VITALS — BP 132/74 | HR 71 | Ht 63.0 in | Wt 169.0 lb

## 2015-10-19 DIAGNOSIS — J449 Chronic obstructive pulmonary disease, unspecified: Secondary | ICD-10-CM | POA: Diagnosis not present

## 2015-10-19 NOTE — Progress Notes (Signed)
[facility] Consultation      Date: 10/19/2015,   MRN# [id number] SIGNE TACKITT 1935/01/06 Code Status:  Hosp day:'@LENGTHOFSTAYDAYS'$ @ Referring MD: '@ATDPROV'$ @     PCP:      AdmissionWeight: 169 lb (76.658 kg)                 CurrentWeight: 169 lb (76.658 kg)      CHIEF COMPLAINT:   Follow up abnormal CT chest, follow up for COPD   HISTORY OF PRESENT ILLNESS   81 yo pleasant white female with h/o COPD and Stage 1 SQ cell cancer dx in 2015 s/p Ct guided biopsy s/p RXT Patient here for follow up CT chest-images reviewed with patient and husband-no new findings noted Patient has had cold like symptoms 2 weeks ago has gotten better Patient has no signs of infection at this time Intermittent wheezing, no cough, no SOB, uses alb as needed    Current Medication:  Current outpatient prescriptions:  .  albuterol (PROVENTIL HFA;VENTOLIN HFA) 108 (90 BASE) MCG/ACT inhaler, Inhale 1-2 puffs into the lungs every 6 (six) hours as needed for wheezing or shortness of breath., Disp: 1 Inhaler, Rfl: 12 .  apixaban (ELIQUIS) 5 MG TABS tablet, Take 1 tablet (5 mg total) by mouth 2 (two) times daily., Disp: 60 tablet, Rfl: 6 .  budesonide (PULMICORT) 0.5 MG/2ML nebulizer solution, Take 2 mLs (0.5 mg total) by nebulization 2 (two) times daily., Disp: 120 mL, Rfl: 11 .  diltiazem (CARDIZEM CD) 300 MG 24 hr capsule, , Disp: , Rfl:  .  diltiazem (TIAZAC) 300 MG 24 hr capsule, Take 1 capsule (300 mg total) by mouth daily., Disp: 30 capsule, Rfl: 11 .  flecainide (TAMBOCOR) 100 MG tablet, Take 1 tablet (100 mg total) by mouth 2 (two) times daily., Disp: 60 tablet, Rfl: 11 .  formoterol (PERFOROMIST) 20 MCG/2ML nebulizer solution, Take 2 mLs (20 mcg total) by nebulization 2 (two) times daily., Disp: 2 mL, Rfl: 12 .  furosemide (LASIX) 40 MG tablet, Take 1 tablet (40 mg total) by mouth 2 (two) times daily as needed., Disp: 60 tablet, Rfl: 6 .  ipratropium-albuterol (DUONEB) 0.5-2.5  (3) MG/3ML SOLN, Take 3 mLs by nebulization daily as needed., Disp: 90 mL, Rfl: 6 .  levothyroxine (SYNTHROID, LEVOTHROID) 75 MCG tablet, Take 75 mcg by mouth daily before breakfast., Disp: , Rfl:  .  Omega-3 Fatty Acids (FISH OIL) 1000 MG CAPS, Take 1,000 mg by mouth daily., Disp: , Rfl:  .  omeprazole (PRILOSEC) 20 MG capsule, Take 20 mg by mouth as needed (as needed for heartburn). , Disp: , Rfl:  .  potassium chloride (K-DUR,KLOR-CON) 10 MEQ tablet, Take 1 tablet (10 mEq total) by mouth 2 (two) times daily as needed. (Patient taking differently: Take 10 mEq by mouth 2 (two) times daily as needed. ), Disp: 60 tablet, Rfl: 6    ALLERGIES   Belladonna alkaloids     REVIEW OF SYSTEMS   Review of Systems  Constitutional: Negative for fever, chills and weight loss.  HENT: Negative for congestion.   Eyes: Negative for blurred vision.  Respiratory: Negative for cough, hemoptysis, sputum production, shortness of breath and wheezing.   Cardiovascular: Negative for chest pain.  All other systems reviewed and are negative.    VS: BP 132/74 mmHg  Pulse 71  Ht '5\' 3"'$  (1.6 m)  Wt 169 lb (76.658 kg)  BMI 29.94 kg/m2  SpO2 96%     PHYSICAL EXAM  Physical Exam  Constitutional: She is oriented to person, place, and time. She appears well-developed and well-nourished. No distress.  HENT:  Head: Normocephalic and atraumatic.  Mouth/Throat: No oropharyngeal exudate.  Eyes: Pupils are equal, round, and reactive to light.  Cardiovascular: Normal rate, regular rhythm and normal heart sounds.   No murmur heard. Pulmonary/Chest: No respiratory distress. She has no wheezes.  Musculoskeletal: Normal range of motion. She exhibits no edema.  Neurological: She is alert and oriented to person, place, and time.  Skin: Skin is warm. She is not diaphoretic.  Psychiatric: She has a normal mood and affect.         IMAGING    Ct Chest Wo Contrast  10/06/2015  CLINICAL DATA:  History of lung  cancer. Radiation treatment only. Followup right lung nodule. EXAM: CT CHEST WITHOUT CONTRAST TECHNIQUE: Multidetector CT imaging of the chest was performed following the standard protocol without IV contrast. COMPARISON:  Chest CT 03/10/2014 and 05/28/2015.  PET-CT 11/19/2014 FINDINGS: Mediastinum/Nodes: No breast masses, supraclavicular or axillary lymphadenopathy. Small scattered lymph nodes are noted. The thyroid gland is grossly normal. The heart is normal in size. No pericardial effusion. Stable aortic and branch vessel atherosclerotic calcifications. Stable coronary artery calcifications. Small scattered mediastinal and hilar lymph nodes are unchanged. 11.5 mm precarinal lymph node is stable. The esophagus is grossly normal. There is a stable moderate-sized hiatal hernia. Lungs/Pleura: Small residual nodular density in the superior segment right lower lobe with surrounding radiation changes. This nodule measures approximately 7 mm and originally measured approximately 15.5 mm. No interval change in the 7.5 x 6.0 mm left lower lobe pulmonary nodule on image number 20. Stable underlying mild emphysematous changes. No acute pulmonary findings. No new pulmonary lesions. No pleural effusion. Bilateral lower lobe tree-in-bud appearance is new or progressive when compared to the prior CT and is most likely due to inflammation or atypical infection such as MAC. Upper abdomen: Cholelithiasis is noted. There is tortuosity, ectasia and calcification of the abdominal aorta. No focal aneurysm. The adrenal glands appear normal. Musculoskeletal: No acute bony findings. Advanced osteoporosis and stable mild compression deformities. Severe degenerative changes noted in the upper lumbar spine. IMPRESSION: 1. Small residual nodular density in the right lower lobe surrounded by radiation changes. The nodule measures a maximum of 7 mm and previously measured 15.5 mm. 2. Stable left lower lobe pulmonary nodule (7.5 x 6.0 mm). 3.  Bilateral lower lobe tree-in-bud appearance likely representing inflammation or atypical infection such as MAC. 4. Small scattered stable mediastinal and hilar lymph nodes but no overt adenopathy. 5. Stable atherosclerotic changes involving the thoracic and abdominal aorta. Electronically Signed   By: Marijo Sanes M.D.   On: 10/06/2015 14:40    ASSESSMENT/PLAN    81 yo white female with moderate COPD GOLD stage B which is stable at this time. H/o Squamous cell carcinoma of lung T1 N1 M0 tumor status post radiation therapy with stereotactic surgery  COPD (chronic obstructive pulmonary disease) -stable at this time -will continue inhaled medications as prescribed-this seems to help her breathing -continue oxygen at night -patient reports pneumovax(2014)   RUL patchy GGO/h/o RUL lung cancer -repeat CT chest in 6 months and follow up in 6 months [place] cell Lung Cancer-follow up with oncology   I have personally obtained a history, examined the patient, evaluated laboratory and independently reviewed imaging results, formulated the assessment and plan and placed orders.  The Patient requires high complexity decision making for assessment and support, frequent evaluation and titration of therapies,  application of advanced monitoring technologies and extensive interpretation of multiple databases.  Patient/Family is satisfied with Plan of action and management.    Corrin Parker, M.D.  [facility]  Medical Director [place] Director [facility] Cardio-Pulmonary Department

## 2015-10-19 NOTE — Patient Instructions (Signed)
Chronic Obstructive Pulmonary Disease Chronic obstructive pulmonary disease (COPD) is a common lung condition in which airflow from the lungs is limited. COPD is a general term that can be used to describe many different lung problems that limit airflow, including both chronic bronchitis and emphysema. If you have COPD, your lung function will probably never return to normal, but there are measures you can take to improve lung function and make yourself feel better. CAUSES   Smoking (common).  Exposure to secondhand smoke.  Genetic problems.  Chronic inflammatory lung diseases or recurrent infections. SYMPTOMS  Shortness of breath, especially with physical activity.  Deep, persistent (chronic) cough with a large amount of thick mucus.  Wheezing.  Rapid breaths (tachypnea).  Gray or bluish discoloration (cyanosis) of the skin, especially in your fingers, toes, or lips.  Fatigue.  Weight loss.  Frequent infections or episodes when breathing symptoms become much worse (exacerbations).  Chest tightness. DIAGNOSIS Your health care provider will take a medical history and perform a physical examination to diagnose COPD. Additional tests for COPD may include:  Lung (pulmonary) function tests.  Chest X-ray.  CT scan.  Blood tests. TREATMENT  Treatment for COPD may include:  Inhaler and nebulizer medicines. These help manage the symptoms of COPD and make your breathing more comfortable.  Supplemental oxygen. Supplemental oxygen is only helpful if you have a low oxygen level in your blood.  Exercise and physical activity. These are beneficial for nearly all people with COPD.  Lung surgery or transplant.  Nutrition therapy to gain weight, if you are underweight.  Pulmonary rehabilitation. This may involve working with a team of health care providers and specialists, such as respiratory, occupational, and physical therapists. HOME CARE INSTRUCTIONS  Take all medicines  (inhaled or pills) as directed by your health care provider.  Avoid over-the-counter medicines or cough syrups that dry up your airway (such as antihistamines) and slow down the elimination of secretions unless instructed otherwise by your health care provider.  If you are a smoker, the most important thing that you can do is stop smoking. Continuing to smoke will cause further lung damage and breathing trouble. Ask your health care provider for help with quitting smoking. He or she can direct you to community resources or hospitals that provide support.  Avoid exposure to irritants such as smoke, chemicals, and fumes that aggravate your breathing.  Use oxygen therapy and pulmonary rehabilitation if directed by your health care provider. If you require home oxygen therapy, ask your health care provider whether you should purchase a pulse oximeter to measure your oxygen level at home.  Avoid contact with individuals who have a contagious illness.  Avoid extreme temperature and humidity changes.  Eat healthy foods. Eating smaller, more frequent meals and resting before meals may help you maintain your strength.  Stay active, but balance activity with periods of rest. Exercise and physical activity will help you maintain your ability to do things you want to do.  Preventing infection and hospitalization is very important when you have COPD. Make sure to receive all the vaccines your health care provider recommends, especially the pneumococcal and influenza vaccines. Ask your health care provider whether you need a pneumonia vaccine.  Learn and use relaxation techniques to manage stress.  Learn and use controlled breathing techniques as directed by your health care provider. Controlled breathing techniques include:  Pursed lip breathing. Start by breathing in (inhaling) through your nose for 1 second. Then, purse your lips as if you were   going to whistle and breathe out (exhale) through the  pursed lips for 2 seconds.  Diaphragmatic breathing. Start by putting one hand on your abdomen just above your waist. Inhale slowly through your nose. The hand on your abdomen should move out. Then purse your lips and exhale slowly. You should be able to feel the hand on your abdomen moving in as you exhale.  Learn and use controlled coughing to clear mucus from your lungs. Controlled coughing is a series of short, progressive coughs. The steps of controlled coughing are: 1. Lean your head slightly forward. 2. Breathe in deeply using diaphragmatic breathing. 3. Try to hold your breath for 3 seconds. 4. Keep your mouth slightly open while coughing twice. 5. Spit any mucus out into a tissue. 6. Rest and repeat the steps once or twice as needed. SEEK MEDICAL CARE IF:  You are coughing up more mucus than usual.  There is a change in the color or thickness of your mucus.  Your breathing is more labored than usual.  Your breathing is faster than usual. SEEK IMMEDIATE MEDICAL CARE IF:  You have shortness of breath while you are resting.  You have shortness of breath that prevents you from:  Being able to talk.  Performing your usual physical activities.  You have chest pain lasting longer than 5 minutes.  Your skin color is more cyanotic than usual.  You measure low oxygen saturations for longer than 5 minutes with a pulse oximeter. MAKE SURE YOU:  Understand these instructions.  Will watch your condition.  Will get help right away if you are not doing well or get worse.   This information is not intended to replace advice given to you by your health care provider. Make sure you discuss any questions you have with your health care provider.   Document Released: 06/14/2005 Document Revised: 09/25/2014 Document Reviewed: 05/01/2013 Elsevier Interactive Patient Education 2016 Elsevier Inc.  

## 2015-11-15 ENCOUNTER — Emergency Department: Payer: Medicare Other

## 2015-11-15 ENCOUNTER — Emergency Department
Admission: EM | Admit: 2015-11-15 | Discharge: 2015-11-15 | Disposition: A | Discharge status: Home / Self Care | Payer: Medicare Other | Attending: Emergency Medicine | Admitting: Emergency Medicine

## 2015-11-15 ENCOUNTER — Encounter: Payer: Self-pay | Admitting: Emergency Medicine

## 2015-11-15 DIAGNOSIS — Y998 Other external cause status: Secondary | ICD-10-CM | POA: Insufficient documentation

## 2015-11-15 DIAGNOSIS — Y92512 Supermarket, store or market as the place of occurrence of the external cause: Secondary | ICD-10-CM | POA: Diagnosis not present

## 2015-11-15 DIAGNOSIS — N182 Chronic kidney disease, stage 2 (mild): Secondary | ICD-10-CM | POA: Diagnosis not present

## 2015-11-15 DIAGNOSIS — Z7951 Long term (current) use of inhaled steroids: Secondary | ICD-10-CM | POA: Insufficient documentation

## 2015-11-15 DIAGNOSIS — S29001A Unspecified injury of muscle and tendon of front wall of thorax, initial encounter: Secondary | ICD-10-CM | POA: Diagnosis present

## 2015-11-15 DIAGNOSIS — W010XXA Fall on same level from slipping, tripping and stumbling without subsequent striking against object, initial encounter: Secondary | ICD-10-CM | POA: Diagnosis not present

## 2015-11-15 DIAGNOSIS — Z79899 Other long term (current) drug therapy: Secondary | ICD-10-CM | POA: Diagnosis not present

## 2015-11-15 DIAGNOSIS — I129 Hypertensive chronic kidney disease with stage 1 through stage 4 chronic kidney disease, or unspecified chronic kidney disease: Secondary | ICD-10-CM | POA: Diagnosis not present

## 2015-11-15 DIAGNOSIS — S0011XA Contusion of right eyelid and periocular area, initial encounter: Secondary | ICD-10-CM | POA: Diagnosis not present

## 2015-11-15 DIAGNOSIS — Z87891 Personal history of nicotine dependence: Secondary | ICD-10-CM | POA: Diagnosis not present

## 2015-11-15 DIAGNOSIS — S8001XA Contusion of right knee, initial encounter: Secondary | ICD-10-CM | POA: Diagnosis not present

## 2015-11-15 DIAGNOSIS — Y9389 Activity, other specified: Secondary | ICD-10-CM | POA: Insufficient documentation

## 2015-11-15 DIAGNOSIS — Z7901 Long term (current) use of anticoagulants: Secondary | ICD-10-CM | POA: Insufficient documentation

## 2015-11-15 DIAGNOSIS — S20211A Contusion of right front wall of thorax, initial encounter: Secondary | ICD-10-CM | POA: Diagnosis not present

## 2015-11-15 MED ORDER — HYDROCODONE-ACETAMINOPHEN 5-325 MG PO TABS: 1.0000 | ORAL_TABLET | ORAL | Status: DC | PRN

## 2015-11-15 NOTE — ED Provider Notes (Signed)
[facility] Emergency Department Provider Note  ____________________________________________  Time seen: On arrival  I have reviewed the triage vital signs and the nursing notes.   HISTORY  Chief Complaint Rib Injury    HPI Toni [name] is a 81 y.o. female presents with complaints of right rib pain. Patient reports that she fell nearly a week ago because she lost her balance while picking up a can of green beans at the grocery store and fell onto her right side. She has bruises on her face and knee but reports she is getting better except continues to have discomfort in her right ribs. She denies difficult breathing. She reports the pain does get worse if she takes a very deep breath. No fevers or chills or cough.    Past Medical History  Diagnosis Date  . Cataract   . COPD (chronic obstructive pulmonary disease) ([place])   . Essential hypertension, benign   . Hypothyroidism   . Atypical atrial flutter ([place]) 11/16/2013    a. CHA2DS2VASc = 5-->prev on xarelto, d/c'd due to bleeding skin tear;  c. 11/2013->Placed on flecainide.  . Chronic diastolic heart failure ([place])   . PAF (paroxysmal atrial fibrillation) ([place])     a. 11/2013 Echo: EF 55-60%, mild AS/MR, mod dil LA/RA, PASP 74mHg;  b. CHA2DS2VASc = 5-->prev on xarelto, d/c'd due to bleeding skin tear;  c. 11/2013->Placed on flecainide.  . Lung nodule     Followed by Dr. FRaul Del . 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 Lung cancer ([place]   . Collagen vascular disease ([place]   . Hypokalemia   . Chronic diastolic CHF (congestive heart failure) ([place]     a. 11/2013 Echo: EF 55-60%, mild AS/MR, mod dil LA/RA, PASP 338mg.    Patient Active Problem List   Diagnosis Date Noted  . Essential hypertension, benign   . Lung infiltrate on CT 06/08/2015  . Chronic diastolic CHF (congestive heart failure) (HCBrusly08/29/2016  . Insomnia 04/17/2014  . Squamous cell lung cancer (HCR[place]itos [place] Lomas07/06/2014  .  Postnasal drip 03/05/2014  . Hyperlipidemia 12/04/2013  . Leg edema 12/04/2013  . Atypical atrial flutter (HCJay[place]03/09/2013  . COPD (chronic obstructive pulmonary disease) (HCBridgeview02/28/2015  . Chronic kidney disease 08/24/2013  . Essential (primary) hypertension 08/24/2013  . Adult hypothyroidism 08/24/2013  . Acute exacerbation of chronic obstructive airways disease (HCNaco12/03/2013  . Chronic obstructive pulmonary disease with acute exacerbation (HCAshland12/03/2013    Past Surgical History  Procedure Laterality Date  . Eye surgery    . Total knee arthroplasty Bilateral     Current Outpatient Rx  Name  Route  Sig  Dispense  Refill  . albuterol (PROVENTIL HFA;VENTOLIN HFA) 108 (90 BASE) MCG/ACT inhaler   Inhalation   Inhale 1-2 puffs into the lungs every 6 (six) hours as needed for wheezing or shortness of breath.   1 Inhaler   12   . apixaban (ELIQUIS) 5 MG TABS tablet   Oral   Take 1 tablet (5 mg total) by mouth 2 (two) times daily.   60 tablet   6   . budesonide (PULMICORT) 0.5 MG/2ML nebulizer solution   Nebulization   Take 2 mLs (0.5 mg total) by nebulization 2 (two) times daily.   120 mL   11   . diltiazem (CARDIZEM CD) 300 MG 24 hr capsule               . diltiazem (TIAZAC) 300 MG  24 hr capsule   Oral   Take 1 capsule (300 mg total) by mouth daily.   30 capsule   11   . flecainide (TAMBOCOR) 100 MG tablet   Oral   Take 1 tablet (100 mg total) by mouth 2 (two) times daily.   60 tablet   11   . formoterol (PERFOROMIST) 20 MCG/2ML nebulizer solution   Nebulization   Take 2 mLs (20 mcg total) by nebulization 2 (two) times daily.   2 mL   12   . furosemide (LASIX) 40 MG tablet   Oral   Take 1 tablet (40 mg total) by mouth 2 (two) times daily as needed.   60 tablet   6   . HYDROcodone-acetaminophen (NORCO/VICODIN) 5-325 MG tablet   Oral   Take 1 tablet by mouth every 4 (four) hours as needed for moderate pain.   20 tablet   0   .  ipratropium-albuterol (DUONEB) 0.5-2.5 (3) MG/3ML SOLN   Nebulization   Take 3 mLs by nebulization daily as needed.   90 mL   6   . levothyroxine (SYNTHROID, LEVOTHROID) 75 MCG tablet   Oral   Take 75 mcg by mouth daily before breakfast.         . Omega-3 Fatty Acids (FISH OIL) 1000 MG CAPS   Oral   Take 1,000 mg by mouth daily.         Mar[place] Kitchen omeprazole (PRILOSEC) 20 MG capsule   Oral   Take 20 mg by mouth as needed (as needed for heartburn).          . potassium chloride (K-DUR,KLOR-CON) 10 MEQ tablet   Oral   Take 1 tablet (10 mEq total) by mouth 2 (two) times daily as needed. Patient taking differently: Take 10 mEq by mouth 2 (two) times daily as needed.    60 tablet   6     Allergies Belladonna alkaloids  Family History  Problem Relation Age of Onset  . CAD Father   . Heart disease Father   . Heart attack Father   . Arrhythmia Sister     Social History Social History  Substance Use Topics  . Smoking status: Former Smoker -- 1.50 packs/day for 50 years    Types: Cigarettes    Quit date: 09/18/2002  . Smokeless tobacco: Never Used  . Alcohol Use: No    Review of Systems  Constitutional: Negative for fever. No dizziness Eyes: Negative for visual changes. ENT: Negative for neck pain Respiratory: No shortness of breath   Musculoskeletal: Negative for back pain. Skin: Negative for rash. Positive for bruises Neurological: Negative for headaches or focal weakness   ____________________________________________   PHYSICAL EXAM:  VITAL SIGNS: ED Triage Vitals  Enc Vitals Group     BP 11/15/15 1113 135/73 mmHg     Pulse Rate 11/15/15 1113 70     Resp 11/15/15 1113 18     Temp 11/15/15 1113 97.7 F (36.5 C)     Temp Source 11/15/15 1113 Oral     SpO2 11/15/15 1113 95 %     Weight 11/15/15 1113 170 lb (77.111 kg)     Height 11/15/15 1113 5' 3"  (1.6 m)     Head Cir --      Peak Flow --      Pain Score 11/15/15 1108 7     Pain Loc --      Pain  Edu? --      Excl. in [place]? --  Constitutional: Alert and oriented. Well appearing and in no distress. Eyes: Conjunctivae are normal.  ENT   Head: Normocephalic. Bruising to right eye   Mouth/Throat: Mucous membranes are moist. Cardiovascular: Normal rate, regular rhythm. Patient tender to palpation along the right anterior chest between ribs 8 and 10, no bony abnormalities felt Respiratory: Normal respiratory effort without tachypnea nor retractions.  Gastrointestinal: Soft and non-tender in all quadrants. No distention. There is no CVA tenderness. Musculoskeletal: Nontender with normal range of motion in all extremities. Bruising and swelling to right knee Neurologic:  Normal speech and language. No gross focal neurologic deficits are appreciated. Skin:  Skin is warm, dry and intact.  Psychiatric: Mood and affect are normal. Patient exhibits appropriate insight and judgment.  ____________________________________________    LABS (pertinent positives/negatives)  Labs Reviewed - No data to display  ____________________________________________     ____________________________________________    RADIOLOGY I have personally reviewed any xrays that were ordered on this patient: Chest x-ray shows no bony abnormalities  ____________________________________________   PROCEDURES  Procedure(s) performed: none   ____________________________________________   INITIAL IMPRESSION / ASSESSMENT AND PLAN / ED COURSE  Pertinent labs & imaging results that were available during my care of the patient were reviewed by me and considered in my medical decision making (see chart for details).  Suspect rib contusion based on exam and unremarkable x-rays. Recommend supportive care, ice and outpatient follow-up as needed. Return precautions discussed.  ____________________________________________   FINAL CLINICAL IMPRESSION(S) / ED DIAGNOSES  Final diagnoses:  Rib  contusion, right, initial encounter     Lavonia Drafts, MD 11/15/15 [phone]

## 2015-11-15 NOTE — Discharge Instructions (Signed)
Chest Contusion A contusion is a deep bruise. Bruises happen when an injury causes bleeding under the skin. Signs of bruising include pain, puffiness (swelling), and discolored skin. The bruise may turn blue, purple, or yellow.  HOME CARE  Put ice on the injured area.  Put ice in a plastic bag.  Place a towel between the skin and the bag.  Leave the ice on for 15-20 minutes at a time, 03-04 times a day for the first 48 hours.  Only take medicine as told by your doctor.  Rest.  Take deep breaths (deep-breathing exercises) as told by your doctor.  Stop smoking if you smoke.  Do not lift objects over 5 pounds (2.3 kilograms) for 3 days or longer if told by your doctor. GET HELP RIGHT AWAY IF:   You have more bruising or puffiness.  You have pain that gets worse.  You have trouble breathing.  You are dizzy, weak, or pass out (faint).  You have blood in your pee (urine) or poop (stool).  You cough up or throw up (vomit) blood.  Your puffiness or pain is not helped with medicines. MAKE SURE YOU:   Understand these instructions.  Will watch your condition.  Will get help right away if you are not doing well or get worse.   This information is not intended to replace advice given to you by your health care provider. Make sure you discuss any questions you have with your health care provider.   Document Released: 02/21/2008 Document Revised: 05/29/2012 Document Reviewed: 02/26/2012 Elsevier Interactive Patient Education [employer].

## 2015-11-15 NOTE — ED Notes (Signed)
Patient to Room 33, undressed.  Multiple bruises noted right side of face, right knee, and left upper arm, and right shoulder.  Patient complains of pain right rib cage, rates the pain as a 7 or 8 on 0-10 scale.  Hurts worse when taking a deep breath or coughing.

## 2015-11-15 NOTE — ED Notes (Signed)
2 days ago tripped and fell, reports still having pain in right ribs, worse with deep breathing.  No resp distress.

## 2015-11-17 ENCOUNTER — Ambulatory Visit (INDEPENDENT_AMBULATORY_CARE_PROVIDER_SITE_OTHER): Payer: Medicare Other | Admitting: Cardiovascular Disease

## 2015-11-17 ENCOUNTER — Encounter: Payer: Self-pay | Admitting: Cardiovascular Disease

## 2015-11-17 VITALS — BP 122/60 | HR 72 | Ht 63.0 in | Wt 173.0 lb

## 2015-11-17 DIAGNOSIS — E785 Hyperlipidemia, unspecified: Secondary | ICD-10-CM

## 2015-11-17 DIAGNOSIS — I10 Essential (primary) hypertension: Secondary | ICD-10-CM | POA: Diagnosis not present

## 2015-11-17 DIAGNOSIS — I5032 Chronic diastolic (congestive) heart failure: Secondary | ICD-10-CM | POA: Diagnosis not present

## 2015-11-17 DIAGNOSIS — I484 Atypical atrial flutter: Secondary | ICD-10-CM | POA: Diagnosis not present

## 2015-11-17 DIAGNOSIS — J432 Centrilobular emphysema: Secondary | ICD-10-CM

## 2015-11-17 DIAGNOSIS — W19XXXA Unspecified fall, initial encounter: Secondary | ICD-10-CM | POA: Insufficient documentation

## 2015-11-17 DIAGNOSIS — Z7189 Other specified counseling: Secondary | ICD-10-CM

## 2015-11-17 NOTE — Assessment & Plan Note (Signed)
Blood pressure is well controlled on today's visit. No changes made to the medications. 

## 2015-11-17 NOTE — Assessment & Plan Note (Signed)
We have recommended that she stay on her Lasix daily, no significant swelling of her left lower extremity Breathing is stable

## 2015-11-17 NOTE — Assessment & Plan Note (Signed)
She reports no recent COPD exacerbation She uses inhalers, managed by pulmonary

## 2015-11-17 NOTE — Assessment & Plan Note (Signed)
No recent lipid panel available Currently not on a statin

## 2015-11-17 NOTE — Progress Notes (Signed)
Patient ID: Toni [name], female    DOB: Aug 01, 1935, 81 y.o.   MRN: [id number]  HPI Comments: Toni [name] is a 81 yo woman with long smoking hx, COPD, hyperlipidemia, previous hospitalization on 11/15/2013 for shortness of breath. history of paroxysmal atrial fibrillation,  Hospitalization at [facility] 09/26/2013 for COPD exacerbation, atrial fibrillation. Noted to be in atrial flutter 09/12/2013 EKGs at the end of February 2015, beginning of March 2015 showing atrial flutter She presents today for routine follow-up of her atrial fibrillation/flutter She has completed radiation and chemotherapy for squamous cell lung cancer   she was recently seen by Ignacia Bayley in our office She was having skin tears on the  Xarelto, it was recommended that she try  eliquis 5 mg twice a day  On today's visit, she reports that she did not start the new medication, it was too expensive  Approximately 7 days up to 10 days ago, she had a fall at [facility], hit her right side of the face, right knee, right arm . She has tremendous ecchymoses over her right face, right knee down to her ankle . She is very uncertain right now about being on blood thinners in general   Denies any tachycardia or shortness of breath symptoms concerning for arrhythmia  Apart from her recent fall, she reports that she feels well  No regular exercise  Weight is stable around 173 pounds     EKG shows normal sinus rhythm with first-degree AV block, prolonged QT   Other past medical history reviewed Bronchitis February 2015  11/15/13 - admitted with Afib RVR started on diltiazem drip  treated with prednisone, Levaquin, nebulizers for COPD exacerbation 3/3 - placed on bipap 3/7 - started on flecainide for A flutter, started on anticoagulation   CT scan in December 2014 identified a lung nodule. Additional workup including biopsy has confirmed squamous cell lung cancer.  radiation starting  August 2015.   stopped smoking 10-12 years  ago. Previous Total cholesterol 220, LDL 155. She does not want a cholesterol medication echocardiogram in the hospital last month showed ejection fraction 55-60%, moderately dilated left atrium and right atrium, mild aortic valve stenosis        Allergies  Allergen Reactions  . Belladonna Alkaloids     "made eyes cross"     Outpatient Encounter Prescriptions as of 11/17/2015  Medication Sig  . albuterol (PROVENTIL HFA;VENTOLIN HFA) 108 (90 BASE) MCG/ACT inhaler Inhale 1-2 puffs into the lungs every 6 (six) hours as needed for wheezing or shortness of breath.  [place] Kitchen apixaban (ELIQUIS) 5 MG TABS tablet Take 1 tablet (5 mg total) by mouth 2 (two) times daily.  . budesonide (PULMICORT) 0.5 MG/2ML nebulizer solution Take 2 mLs (0.5 mg total) by nebulization 2 (two) times daily.  [place] Kitchen diltiazem (CARDIZEM CD) 300 MG 24 hr capsule Take 300 mg by mouth daily.   [place] Kitchen diltiazem (TIAZAC) 300 MG 24 hr capsule Take 1 capsule (300 mg total) by mouth daily.  . flecainide (TAMBOCOR) 100 MG tablet Take 1 tablet (100 mg total) by mouth 2 (two) times daily.  . formoterol (PERFOROMIST) 20 MCG/2ML nebulizer solution Take 2 mLs (20 mcg total) by nebulization 2 (two) times daily.  . furosemide (LASIX) 40 MG tablet Take 40 mg by mouth daily.  [place] Kitchen HYDROcodone-acetaminophen (NORCO/VICODIN) 5-325 MG tablet Take 1 tablet by mouth every 4 (four) hours as needed for moderate pain.  [place] ipratropium-albuterol (DUONEB) 0.5-2.5 (3) MG/3ML SOLN Take 3 mLs by nebulization daily as needed.  [place] Kitchen  levothyroxine (SYNTHROID, LEVOTHROID) 75 MCG tablet Take 75 mcg by mouth daily before breakfast.  . Omega-3 Fatty Acids (FISH OIL) 1000 MG CAPS Take 1,000 mg by mouth daily.  [place] Kitchen omeprazole (PRILOSEC) 20 MG capsule Take 20 mg by mouth as needed (as needed for heartburn).   . potassium chloride (MICRO-K) 10 MEQ CR capsule Take 10 mEq by mouth daily.  . [DISCONTINUED] furosemide (LASIX) 40 MG tablet Take 1 tablet (40 mg total) by mouth 2 (two) times  daily as needed. (Patient not taking: Reported on 11/17/2015)  . [DISCONTINUED] potassium chloride (K-DUR,KLOR-CON) 10 MEQ tablet Take 1 tablet (10 mEq total) by mouth 2 (two) times daily as needed. (Patient not taking: Reported on 11/17/2015)   No facility-administered encounter medications on file as of 11/17/2015.    Past Medical History  Diagnosis Date  . Cataract   . COPD (chronic obstructive pulmonary disease) ([place])   . Essential hypertension, benign   . Hypothyroidism   . Atypical atrial flutter ([place]) 11/16/2013    a. CHA2DS2VASc = 5-->prev on xarelto, d/c'd due to bleeding skin tear;  c. 11/2013->Placed on flecainide.  . Chronic diastolic heart failure ([place])   . PAF (paroxysmal atrial fibrillation) ([place])     a. 11/2013 Echo: EF 55-60%, mild AS/MR, mod dil LA/RA, PASP 9mHg;  b. CHA2DS2VASc = 5-->prev on xarelto, d/c'd due to bleeding skin tear;  c. 11/2013->Placed on flecainide.  . Lung nodule     Followed by Dr. FRaul Del . 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 Lung cancer ([facility]   . Collagen vascular disease ([facility]   . Hypokalemia   . Chronic diastolic CHF (congestive heart failure) ([facility]     a. 11/2013 Echo: EF 55-60%, mild AS/MR, mod dil LA/RA, PASP 375mg.    Past Surgical History  Procedure Laterality Date  . Eye surgery    . Total knee arthroplasty Bilateral     Social History  reports that she quit smoking about 13 years ago. Her smoking use included Cigarettes. She has a 75 pack-year smoking history. She has never used smokeless tobacco. She reports that she does not drink alcohol or use illicit drugs.  Family History family history includes Arrhythmia in her sister; CAD in her father; Heart attack in her father; Heart disease in her father.   Review of Systems  Constitutional: Negative.   Respiratory: Negative.   Cardiovascular: Negative.   Gastrointestinal: Negative.   Musculoskeletal: Negative.   Skin: Negative.        Large regions of  bruising right face, right leg  Neurological: Negative.   Hematological: Negative.   Psychiatric/Behavioral: Negative.   All other systems reviewed and are negative.   BP 122/60 mmHg  Pulse 72  Ht _0  (1.6 m)  Wt 173 lb (78.472 kg)  BMI 30.65 kg/m2  Physical Exam  Constitutional: She is oriented to person, place, and time. She appears well-developed and well-nourished.  HENT:  Head: Normocephalic.  Nose: Nose normal.  Mouth/Throat: Oropharynx is clear and moist.  Eyes: Conjunctivae are normal. Pupils are equal, round, and reactive to light.  Neck: Normal range of motion. Neck supple. No JVD present.  Cardiovascular: Normal rate, regular rhythm, S1 normal, S2 normal and intact distal pulses.  Exam reveals no gallop and no friction rub.   Murmur heard.  Systolic murmur is present with a grade of 2/6  Right leg with ecchymosis and swelling  Pulmonary/Chest: Effort normal. No respiratory distress. She has decreased breath  sounds. She has no wheezes. She has rales. She exhibits no tenderness.  Abdominal: Soft. Bowel sounds are normal. She exhibits no distension. There is no tenderness.  Musculoskeletal: Normal range of motion. She exhibits no edema or tenderness.  Lymphadenopathy:    She has no cervical adenopathy.  Neurological: She is alert and oriented to person, place, and time. Coordination normal.  Skin: Skin is warm and dry. No rash noted. No erythema.  Large regions of ecchymosis over her right eye, right cheek, arm, tremendous swelling of her right knee down to her ankle with ecchymoses  Psychiatric: She has a normal mood and affect. Her behavior is normal. Judgment and thought content normal.    Assessment and Plan  Nursing note and vitals reviewed.

## 2015-11-17 NOTE — Patient Instructions (Signed)
You are doing well. No medication changes were made.  Please call us if you have new issues that need to be addressed before your next appt.  Your physician wants you to follow-up in: 6 months.  You will receive a reminder letter in the mail two months in advance. If you don't receive a letter, please call our office to schedule the follow-up appointment.   

## 2015-11-17 NOTE — Assessment & Plan Note (Signed)
She lost her balance while at [place] regions of ecchymosis as detailed above, right face, right knee, arm Possibly high risk of falling in the future This complicates her anticoagulation therapy Currently not a candidate given the significant hematoma around her right knee We did discuss warfarin   Total encounter time more than 25 minutes  Greater than 50% was spent in counseling and coordination of care with the patient

## 2015-11-17 NOTE — Assessment & Plan Note (Signed)
Long discussion today concerning the various types of anticoagulation, cost of anticoagulation, risk of bleeding and bruising I'm very glad that she was not on anticoagulation when she fell given the extent of her ecchymosis and bruising.significant right knee swelling from hematoma.  We did discuss possibly starting warfarin which might be more cost effective She is currently not on a position to start any anticoagulation given her large regions of ecchymosis and hematoma. She will think about warfarin and call our office back if she would like to learn more or start the medication

## 2015-12-02 IMAGING — US US EXTREM LOW VENOUS BILAT
1 series · 14 of 24 positions shown · non-contrast
Comparison: None.

CLINICAL DATA: Lower extremity edema

EXAM:
BILATERAL LOWER EXTREMITY VENOUS DUPLEX ULTRASOUND
TECHNIQUE: Gray-scale sonography with graded compression, as well as color
Doppler and duplex ultrasound, were performed to evaluate the deep
and superficial veins of both lower extremities. Spectral Doppler
was utilized to evaluate flow at rest and with distal augmentation
maneuvers. A complete superficial venous insufficiency exam was
performed in the upright standing position. I personally performed
the technical portion of the exam.

[Series 1: us extrem low venous bilat · 0.11mm/px · 14 of 80 slices shown]
[im 1/80]
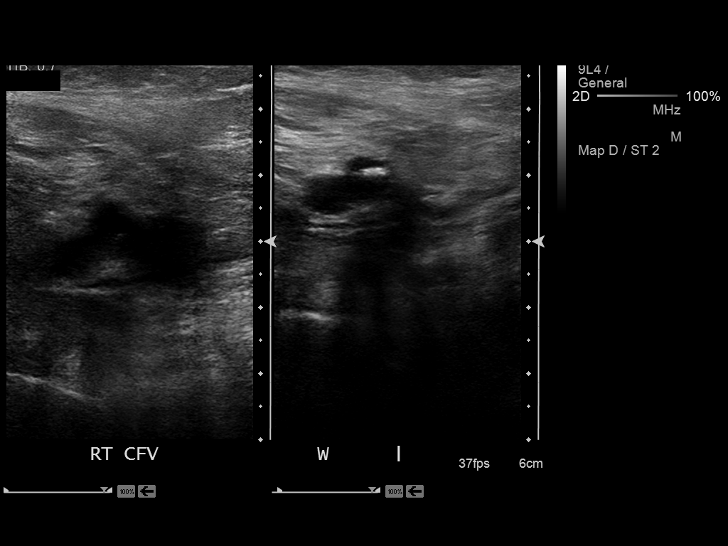
[im 7/80]
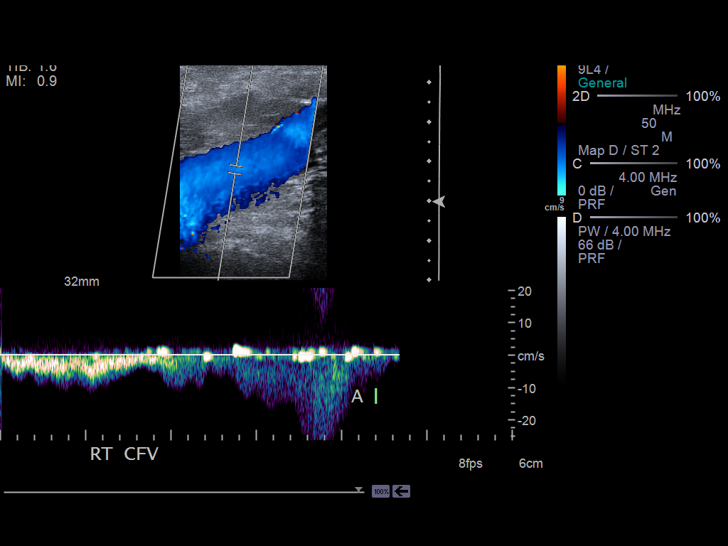
[im 14/80]
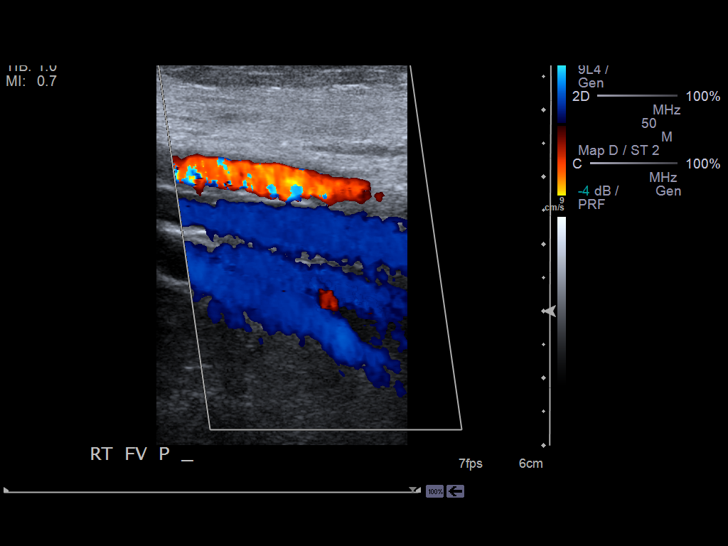
[im 21/80]
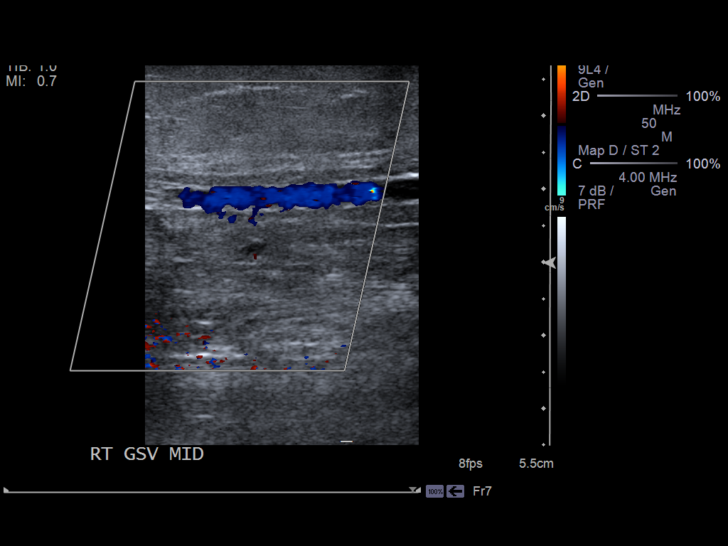
[im 25/80]
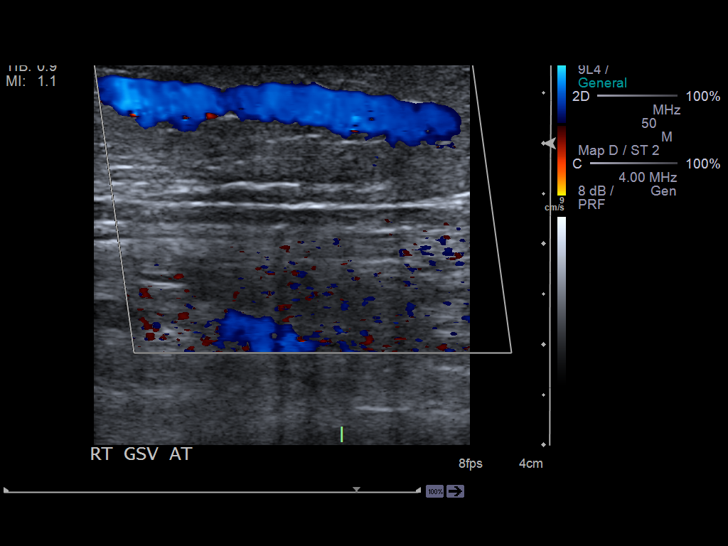
[im 31/80]
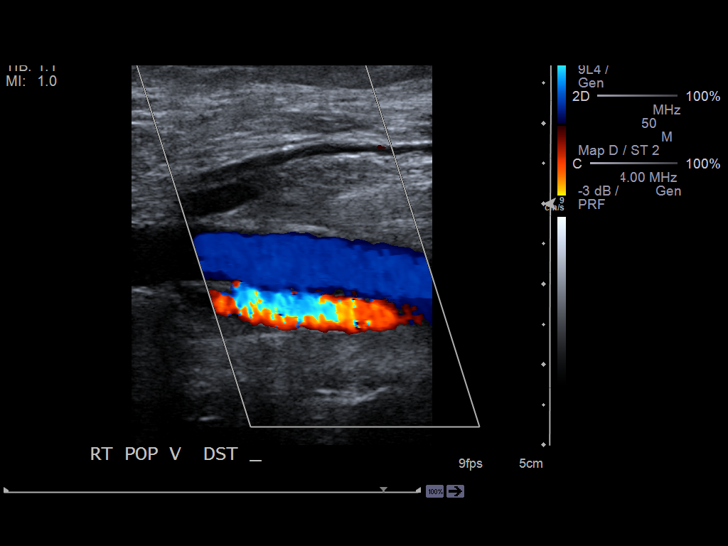
[im 38/80]
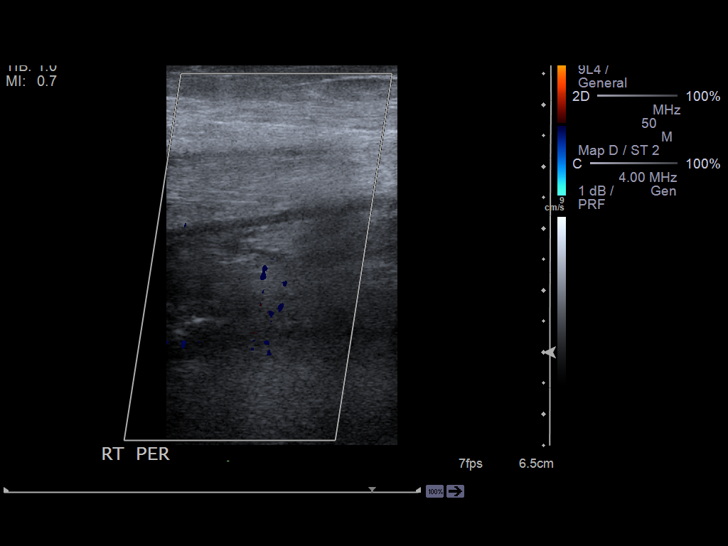
[im 42/80]
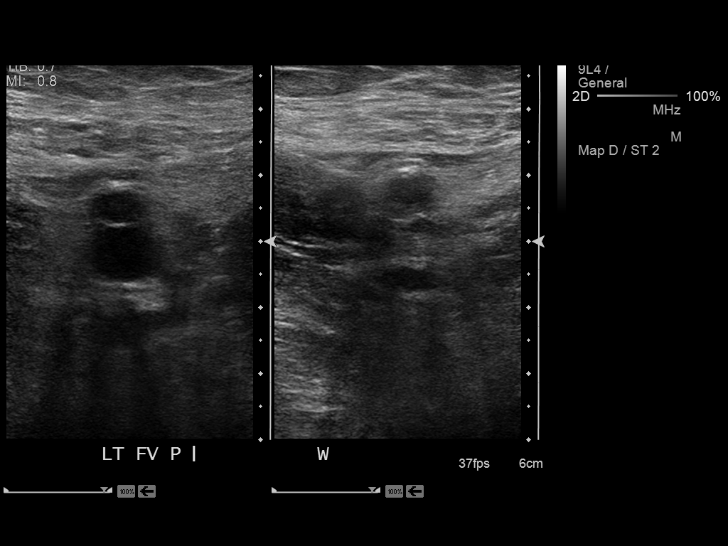
[im 49/80]
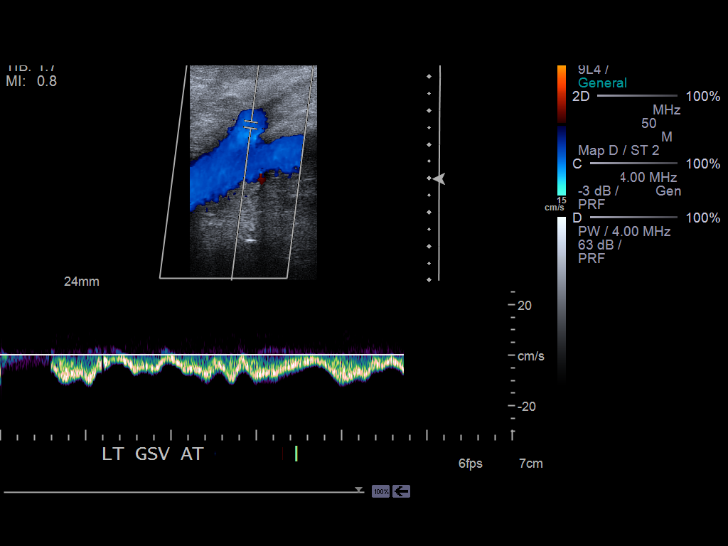
[im 55/80]
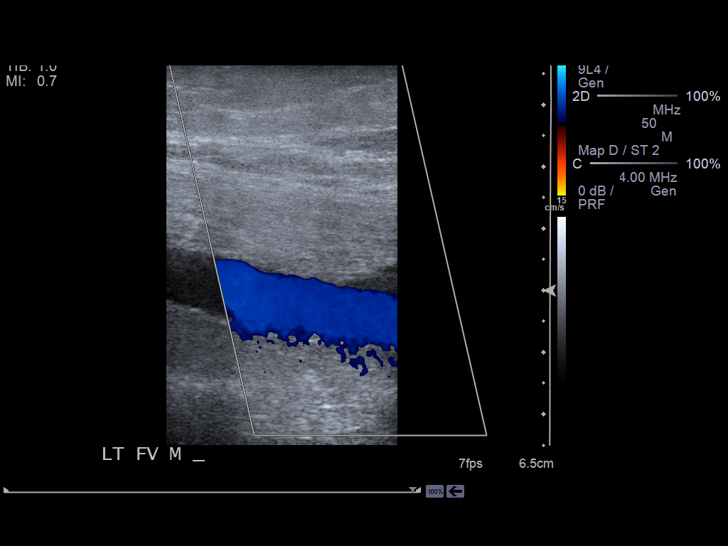
[im 62/80]
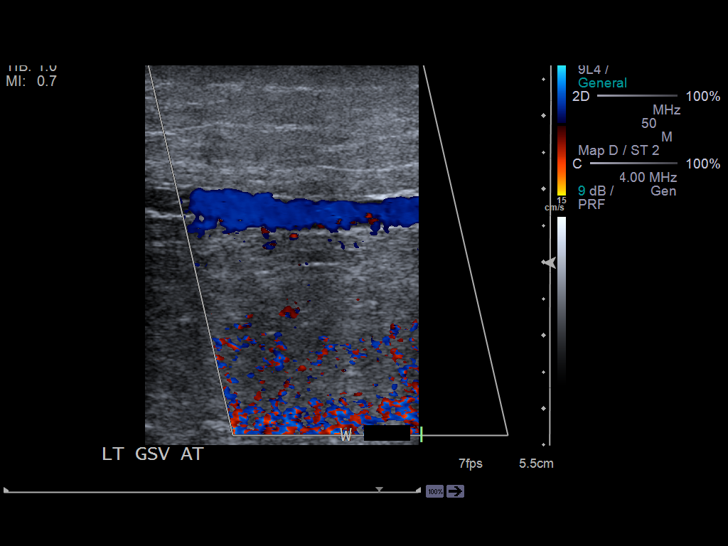
[im 66/80]
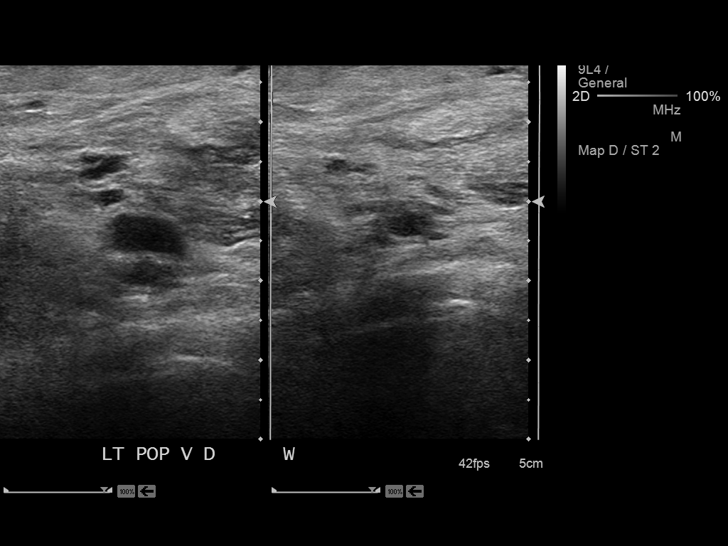
[im 73/80]
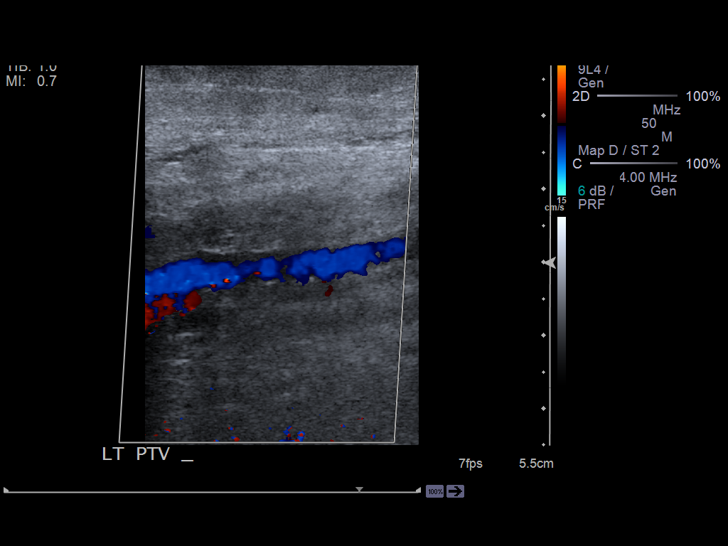
[im 80/80]
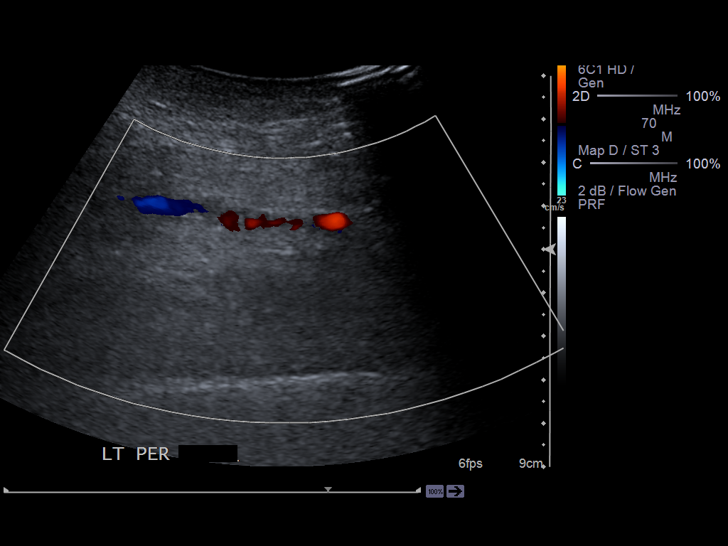

[14 of 24 positions shown; findings below may reference images not displayed]

FINDINGS: There is complete compressibility of the bilateral common femoral,
femoral, and popliteal veins. Doppler analysis demonstrates
respiratory phasicity and augmentation of flow upon calf compression
bilaterally. No obvious superficial vein or calf vein thrombosis.
IMPRESSION: No evidence of lower extremity DVT.

## 2015-12-16 ENCOUNTER — Emergency Department: Payer: Medicare Other

## 2015-12-16 ENCOUNTER — Emergency Department
Admission: EM | Admit: 2015-12-16 | Discharge: 2015-12-17 | Disposition: A | Discharge status: Home / Self Care | Payer: Medicare Other | Attending: Emergency Medicine | Admitting: Emergency Medicine

## 2015-12-16 ENCOUNTER — Encounter: Payer: Self-pay | Admitting: Emergency Medicine

## 2015-12-16 DIAGNOSIS — Y9389 Activity, other specified: Secondary | ICD-10-CM | POA: Diagnosis not present

## 2015-12-16 DIAGNOSIS — N182 Chronic kidney disease, stage 2 (mild): Secondary | ICD-10-CM | POA: Diagnosis not present

## 2015-12-16 DIAGNOSIS — S0011XA Contusion of right eyelid and periocular area, initial encounter: Secondary | ICD-10-CM | POA: Insufficient documentation

## 2015-12-16 DIAGNOSIS — S20219A Contusion of unspecified front wall of thorax, initial encounter: Secondary | ICD-10-CM | POA: Diagnosis not present

## 2015-12-16 DIAGNOSIS — S60221A Contusion of right hand, initial encounter: Secondary | ICD-10-CM | POA: Insufficient documentation

## 2015-12-16 DIAGNOSIS — Y998 Other external cause status: Secondary | ICD-10-CM | POA: Diagnosis not present

## 2015-12-16 DIAGNOSIS — Z7951 Long term (current) use of inhaled steroids: Secondary | ICD-10-CM | POA: Insufficient documentation

## 2015-12-16 DIAGNOSIS — J441 Chronic obstructive pulmonary disease with (acute) exacerbation: Secondary | ICD-10-CM | POA: Diagnosis not present

## 2015-12-16 DIAGNOSIS — Y9289 Other specified places as the place of occurrence of the external cause: Secondary | ICD-10-CM | POA: Diagnosis not present

## 2015-12-16 DIAGNOSIS — Z9981 Dependence on supplemental oxygen: Secondary | ICD-10-CM | POA: Diagnosis not present

## 2015-12-16 DIAGNOSIS — R0602 Shortness of breath: Secondary | ICD-10-CM

## 2015-12-16 DIAGNOSIS — I129 Hypertensive chronic kidney disease with stage 1 through stage 4 chronic kidney disease, or unspecified chronic kidney disease: Secondary | ICD-10-CM | POA: Diagnosis not present

## 2015-12-16 DIAGNOSIS — Z87891 Personal history of nicotine dependence: Secondary | ICD-10-CM | POA: Insufficient documentation

## 2015-12-16 DIAGNOSIS — Z7901 Long term (current) use of anticoagulants: Secondary | ICD-10-CM | POA: Diagnosis not present

## 2015-12-16 DIAGNOSIS — W1839XA Other fall on same level, initial encounter: Secondary | ICD-10-CM | POA: Diagnosis not present

## 2015-12-16 DIAGNOSIS — Z79899 Other long term (current) drug therapy: Secondary | ICD-10-CM | POA: Diagnosis not present

## 2015-12-16 DIAGNOSIS — S60222A Contusion of left hand, initial encounter: Secondary | ICD-10-CM | POA: Insufficient documentation

## 2015-12-16 LAB — BASIC METABOLIC PANEL
Anion gap: 11 (ref 5–15)
BUN: 28 mg/dL — ABNORMAL HIGH (ref 6–20)
CHLORIDE: 103 mmol/L (ref 101–111)
CO2: 23 mmol/L (ref 22–32)
CREATININE: 1.16 mg/dL — AB (ref 0.44–1.00)
Calcium: 9 mg/dL (ref 8.9–10.3)
GFR calc non Af Amer: 44 mL/min — ABNORMAL LOW (ref >60)
GFR, EST AFRICAN AMERICAN: 51 mL/min — AB (ref >60)
Glucose, Bld: 107 mg/dL — ABNORMAL HIGH (ref 65–99)
Potassium: 3.8 mmol/L (ref 3.5–5.1)
Sodium: 137 mmol/L (ref 135–145)

## 2015-12-16 LAB — CBC
HCT: 31.7 % — ABNORMAL LOW (ref 35.0–47.0)
HEMOGLOBIN: 10.3 g/dL — AB (ref 12.0–16.0)
MCH: 28.9 pg (ref 26.0–34.0)
MCHC: 32.6 g/dL (ref 32.0–36.0)
MCV: 88.6 fL (ref 80.0–100.0)
PLATELETS: 224 10*3/uL (ref 150–440)
RBC: 3.57 MIL/uL — AB (ref 3.80–5.20)
RDW: 16.5 % — ABNORMAL HIGH (ref 11.5–14.5)
WBC: 6.8 10*3/uL (ref 3.6–11.0)

## 2015-12-16 MED ORDER — SODIUM CHLORIDE 0.9 % IV BOLUS (SEPSIS)
500.0000 mL | Freq: Once | INTRAVENOUS | Status: AC
  Administered 2015-12-16: 500 mL via INTRAVENOUS

## 2015-12-16 MED ORDER — IPRATROPIUM-ALBUTEROL 0.5-2.5 (3) MG/3ML IN SOLN
3.0000 mL | Freq: Once | RESPIRATORY_TRACT | Status: AC
  Administered 2015-12-16: 3 mL via RESPIRATORY_TRACT
  Filled 2015-12-16: qty 3

## 2015-12-16 NOTE — ED Provider Notes (Signed)
[facility] Emergency Department Provider Note  ____________________________________________  Time seen: Approximately 11:18 PM  I have reviewed the triage vital signs and the nursing notes.   HISTORY  Chief Complaint Respiratory Distress and Dysphagia    HPI Toni [name] is a 81 y.o. female presents to the ED from home via EMS with a chief complaint of shortness of breath. Patient has a history of COPD on continuous 2 L oxygen via nasal cannula at baseline. Reports increased cough over the past 2 days. Reports increased shortness of breath around 5:30 PM after eating an egg sandwich. Of note, patient is not allergic to eggs. She was given 2 albuterol nebs, 125 mg Solu-Medrol and 4 mg IV Zofran via EMS. Patient did note she was belching and complained of indigestion at the time. The symptoms have now resolved and her breathing is improved. Denies recent fever, chills, abdominal pain, nausea, vomiting, diarrhea. Denies recent travel or trauma. Of note, states she is off Eliquis 2 months per her request. 3 weeks ago she had a mechanical fall and currently has bruising across her chest and upper extremities.   Past Medical History  Diagnosis Date  . Cataract   . COPD (chronic obstructive pulmonary disease) ([place])   . Essential hypertension, benign   . Hypothyroidism   . Atypical atrial flutter ([place]) 11/16/2013    a. CHA2DS2VASc = 5-->prev on xarelto, d/c'd due to bleeding skin tear;  c. 11/2013->Placed on flecainide.  . Chronic diastolic heart failure ([place])   . PAF (paroxysmal atrial fibrillation) ([place])     a. 11/2013 Echo: EF 55-60%, mild AS/MR, mod dil LA/RA, PASP 79mHg;  b. CHA2DS2VASc = 5-->prev on xarelto, d/c'd due to bleeding skin tear;  c. 11/2013->Placed on flecainide.  . Lung nodule     Followed by Dr. FRaul Del . 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 Lung cancer ([facility]   . Collagen vascular disease ([place]   . Hypokalemia   .  Chronic diastolic CHF (congestive heart failure) ([place]     a. 11/2013 Echo: EF 55-60%, mild AS/MR, mod dil LA/RA, PASP 364mg.    Patient Active Problem List   Diagnosis Date Noted  . Encounter for anticoagulation discussion and counseling 11/17/2015  . Fall 11/17/2015  . Essential hypertension, benign   . Lung infiltrate on CT 06/08/2015  . Chronic diastolic CHF (congestive heart failure) (HCEarlsboro08/29/2016  . Insomnia 04/17/2014  . Squamous cell lung cancer (HCMadisonville07/06/2014  . Postnasal drip 03/05/2014  . Hyperlipidemia 12/04/2013  . Leg edema 12/04/2013  . Atypical atrial flutter (HC[place]uda Springs03/09/2013  . COPD (chronic obstructive pulmonary disease) (HCTope[place]02/28/2015  . Chronic kidney disease 08/24/2013  . Essential (primary) hypertension 08/24/2013  . Adult hypothyroidism 08/24/2013  . Acute exacerbation of chronic obstructive airways disease (HCTryon12/03/2013  . Chronic obstructive pulmonary disease with acute exacerbation (HCBirmingham12/03/2013    Past Surgical History  Procedure Laterality Date  . Eye surgery    . Total knee arthroplasty Bilateral     Current Outpatient Rx  Name  Route  Sig  Dispense  Refill  . albuterol (PROVENTIL HFA;VENTOLIN HFA) 108 (90 BASE) MCG/ACT inhaler   Inhalation   Inhale 1-2 puffs into the lungs every 6 (six) hours as needed for wheezing or shortness of breath.   1 Inhaler   12   . apixaban (ELIQUIS) 5 MG TABS tablet   Oral   Take 1 tablet (5 mg total) by mouth 2 (  two) times daily.   60 tablet   6   . budesonide (PULMICORT) 0.5 MG/2ML nebulizer solution   Nebulization   Take 2 mLs (0.5 mg total) by nebulization 2 (two) times daily.   120 mL   11   . diltiazem (TIAZAC) 300 MG 24 hr capsule   Oral   Take 1 capsule (300 mg total) by mouth daily.   30 capsule   11   . flecainide (TAMBOCOR) 100 MG tablet   Oral   Take 1 tablet (100 mg total) by mouth 2 (two) times daily.   60 tablet   11   . formoterol (PERFOROMIST) 20 MCG/2ML nebulizer  solution   Nebulization   Take 2 mLs (20 mcg total) by nebulization 2 (two) times daily.   2 mL   12   . furosemide (LASIX) 40 MG tablet   Oral   Take 40 mg by mouth daily.         [place] Kitchen ipratropium-albuterol (DUONEB) 0.5-2.5 (3) MG/3ML SOLN   Nebulization   Take 3 mLs by nebulization daily as needed.   90 mL   6   . levothyroxine (SYNTHROID, LEVOTHROID) 75 MCG tablet   Oral   Take 75 mcg by mouth daily before breakfast.         . Omega-3 Fatty Acids (FISH OIL) 1000 MG CAPS   Oral   Take 1,000 mg by mouth daily.         [place] Kitchen omeprazole (PRILOSEC) 20 MG capsule   Oral   Take 20 mg by mouth as needed (as needed for heartburn).          . potassium chloride (MICRO-K) 10 MEQ CR capsule   Oral   Take 10 mEq by mouth daily.           Allergies Belladonna alkaloids  Family History  Problem Relation Age of Onset  . CAD Father   . Heart disease Father   . Heart attack Father   . Arrhythmia Sister     Social History Social History  Substance Use Topics  . Smoking status: Former Smoker -- 1.50 packs/day for 50 years    Types: Cigarettes    Quit date: 09/18/2002  . Smokeless tobacco: Never Used  . Alcohol Use: No    Review of Systems  Constitutional: No fever/chills. Eyes: No visual changes. ENT: No sore throat. Cardiovascular: Denies chest pain. Respiratory: Positive for cough and shortness of breath. Gastrointestinal: No abdominal pain.  No nausea, no vomiting.  No diarrhea.  No constipation. Genitourinary: Negative for dysuria. Musculoskeletal: Negative for back pain. Skin: Negative for rash. Neurological: Negative for headaches, focal weakness or numbness.  10-point ROS otherwise negative.  ____________________________________________   PHYSICAL EXAM:  VITAL SIGNS: ED Triage Vitals  Enc Vitals Group     BP 12/16/15 2251 138/60 mmHg     Pulse Rate 12/16/15 2251 73     Resp 12/16/15 2251 20     Temp 12/16/15 2251 97.5 F (36.4 C)     Temp  Source 12/16/15 2251 Oral     SpO2 12/16/15 2251 100 %     Weight 12/16/15 2251 171 lb (77.565 kg)     Height 12/16/15 2251 _0  (1.6 m)     Head Cir --      Peak Flow --      Pain Score 12/16/15 2252 0     Pain Loc --      Pain Edu? --  Excl. in [place]? --     Constitutional: Alert and oriented. Chronically ill appearing and in mild acute distress. Eyes: Conjunctivae are normal. PERRL. EOMI. fading right periorbital ecchymosis. Head: Atraumatic. Nose: No congestion/rhinnorhea. Mouth/Throat: Mucous membranes are moist.  Oropharynx non-erythematous. Neck: No stridor.   Cardiovascular: Normal rate, regular rhythm. I/VI SEM murmur.  Good peripheral circulation. Respiratory: Normal respiratory effort.  No retractions. Lungs with scant wheezing. Fading ecchymosis anterior chest wall. Gastrointestinal: Soft and nontender. No distention. No abdominal bruits. No CVA tenderness. Musculoskeletal: Fading ecchymosis bilateral upper extremities. No lower extremity tenderness nor edema.  No joint effusions. Neurologic:  Normal speech and language. No gross focal neurologic deficits are appreciated. No gait instability. Skin:  Skin is warm, dry and intact. No rash noted. Psychiatric: Mood and affect are normal. Speech and behavior are normal.  ____________________________________________   LABS (all labs ordered are listed, but only abnormal results are displayed)  Labs Reviewed  BASIC METABOLIC PANEL - Abnormal; Notable for the following:    Glucose, Bld 107 (*)    BUN 28 (*)    Creatinine, Ser 1.16 (*)    GFR calc non Af Amer 44 (*)    GFR calc Af Amer 51 (*)    All other components within normal limits  CBC - Abnormal; Notable for the following:    RBC 3.57 (*)    Hemoglobin 10.3 (*)    HCT 31.7 (*)    RDW 16.5 (*)    All other components within normal limits  TROPONIN I   ____________________________________________  EKG  ED ECG REPORT I, SUNG,JADE J, the attending  physician, personally viewed and interpreted this ECG.   Date: 12/16/2015  EKG Time: 2256  Rate: 69  Rhythm: normal EKG, normal sinus rhythm  Axis: Normal  Intervals:none  ST&T Change: Nonspecific  ____________________________________________  RADIOLOGY  Portable chest x-ray (viewed by me, interpreted per Dr. Quintella Reichert): No active disease. ____________________________________________   PROCEDURES  Procedure(s) performed: None  Critical Care performed: No  ____________________________________________   INITIAL IMPRESSION / ASSESSMENT AND PLAN / ED COURSE  Pertinent labs & imaging results that were available during my care of the patient were reviewed by me and considered in my medical decision making (see chart for details).  81 year old female with a history of COPD on continuous oxygen therapy who presents with mild exacerbation. Scant wheezing remains on lung exam. Will administer DuoNeb, infuse IV fluids as patient has a slight bump in her BUN and creatinine which is thought secondary to mild dehydration, and reassess. Patient already received IV Solu-Medrol per EMS prior to arrival.  ----------------------------------------- 1:36 AM on 12/17/2015 -----------------------------------------  Wheezing cleared, good aeration, saturations on patient's baseline oxygen 100%. Patient is feeling better overall and is eager for discharge. Updated patient and spouse of laboratory and imaging results. Will place on steroid taper and she will have close follow-up with her PCP as well as her pulmonologist. Strict return precautions given. Both verbalize understanding and agree with plan of care. ____________________________________________   FINAL CLINICAL IMPRESSION(S) / ED DIAGNOSES  Final diagnoses:  Shortness of breath  Chronic obstructive pulmonary disease with acute exacerbation (HCC)      Paulette Blanch, MD 12/17/15 [phone]

## 2015-12-16 NOTE — ED Notes (Signed)
Pt arrived via EMS from home c/o difficulty breathing and difficulty swallowing. Pt reports that this started around 5:30 this evening after eating an egg sandwich. EMS gave pt 2 albuterol breathing treatments, 125 of solumedrol and 4 mg of zofran because pt was belching. Pt has hx of COPD and wears 2L of oxygen at home. Pt has expiratory wheezing in bilateral upper lobes and is diminished in the lower lobes.

## 2015-12-17 DIAGNOSIS — J441 Chronic obstructive pulmonary disease with (acute) exacerbation: Secondary | ICD-10-CM | POA: Diagnosis not present

## 2015-12-17 LAB — TROPONIN I: Troponin I: 0.03 ng/mL (ref <0.031)

## 2015-12-17 MED ORDER — PREDNISONE 20 MG PO TABS: ORAL_TABLET | ORAL | Status: DC

## 2015-12-17 NOTE — ED Notes (Signed)
Discharge instructions reviewed with patient. Patient verbalized understanding. Patient ambulated to lobby without difficulty.   

## 2015-12-17 NOTE — Discharge Instructions (Signed)
1. Take steroid taper as prescribed (prednisone). 2. Continue all medications as directed by your doctor. 3. Return to the ER for worsening symptoms, fever, persistent vomiting, difficulty breathing or other concerns.  Chronic Obstructive Pulmonary Disease Exacerbation Chronic obstructive pulmonary disease (COPD) is a common lung condition in which airflow from the lungs is limited. COPD is a general term that can be used to describe many different lung problems that limit airflow, including chronic bronchitis and emphysema. COPD exacerbations are episodes when breathing symptoms become much worse and require extra treatment. Without treatment, COPD exacerbations can be life threatening, and frequent COPD exacerbations can cause further damage to your lungs. CAUSES  Respiratory infections.  Exposure to smoke.  Exposure to air pollution, chemical fumes, or dust. Sometimes there is no apparent cause or trigger. RISK FACTORS  Smoking cigarettes.  Older age.  Frequent prior COPD exacerbations. SIGNS AND SYMPTOMS  Increased coughing.  Increased thick spit (sputum) production.  Increased wheezing.  Increased shortness of breath.  Rapid breathing.  Chest tightness. DIAGNOSIS Your medical history, a physical exam, and tests will help your health care provider make a diagnosis. Tests may include:  A chest X-ray.  Basic lab tests.  Sputum testing.  An arterial blood gas test. TREATMENT Depending on the severity of your COPD exacerbation, you may need to be admitted to a hospital for treatment. Some of the treatments commonly used to treat COPD exacerbations are:   Antibiotic medicines.  Bronchodilators. These are drugs that expand the air passages. They may be given with an inhaler or nebulizer. Spacer devices may be needed to help improve drug delivery.  Corticosteroid medicines.  Supplemental oxygen therapy.  Airway clearing techniques, such as noninvasive ventilation  (NIV) and positive expiratory pressure (PEP). These provide respiratory support through a mask or other noninvasive device. HOME CARE INSTRUCTIONS  Do not smoke. Quitting smoking is very important to prevent COPD from getting worse and exacerbations from happening as often.  Avoid exposure to all substances that irritate the airway, especially to tobacco smoke.  If you were prescribed an antibiotic medicine, finish it all even if you start to feel better.  Take all medicines as directed by your health care provider.It is important to use correct technique with inhaled medicines.  Drink enough fluids to keep your urine clear or pale yellow (unless you have a medical condition that requires fluid restriction).  Use a cool mist vaporizer. This makes it easier to clear your chest when you cough.  If you have a home nebulizer and oxygen, continue to use them as directed.  Maintain all necessary vaccinations to prevent infections.  Exercise regularly.  Eat a healthy diet.  Keep all follow-up appointments as directed by your health care provider. SEEK IMMEDIATE MEDICAL CARE IF:  You have worsening shortness of breath.  You have trouble talking.  You have severe chest pain.  You have blood in your sputum.  You have a fever.  You have weakness, vomit repeatedly, or faint.  You feel confused.  You continue to get worse. MAKE SURE YOU:  Understand these instructions.  Will watch your condition.  Will get help right away if you are not doing well or get worse.   This information is not intended to replace advice given to you by your health care provider. Make sure you discuss any questions you have with your health care provider.   Document Released: 07/02/2007 Document Revised: 09/25/2014 Document Reviewed: 05/09/2013 Elsevier Interactive Patient Education [employer].

## 2015-12-21 ENCOUNTER — Emergency Department
Admission: EM | Admit: 2015-12-21 | Discharge: 2015-12-21 | Disposition: A | Discharge status: Home / Self Care | Payer: Medicare Other | Attending: Emergency Medicine | Admitting: Emergency Medicine

## 2015-12-21 ENCOUNTER — Emergency Department: Payer: Medicare Other

## 2015-12-21 DIAGNOSIS — G4700 Insomnia, unspecified: Secondary | ICD-10-CM

## 2015-12-21 DIAGNOSIS — E039 Hypothyroidism, unspecified: Secondary | ICD-10-CM | POA: Insufficient documentation

## 2015-12-21 DIAGNOSIS — I5032 Chronic diastolic (congestive) heart failure: Secondary | ICD-10-CM | POA: Diagnosis not present

## 2015-12-21 DIAGNOSIS — K219 Gastro-esophageal reflux disease without esophagitis: Secondary | ICD-10-CM | POA: Insufficient documentation

## 2015-12-21 DIAGNOSIS — I130 Hypertensive heart and chronic kidney disease with heart failure and stage 1 through stage 4 chronic kidney disease, or unspecified chronic kidney disease: Secondary | ICD-10-CM | POA: Diagnosis not present

## 2015-12-21 DIAGNOSIS — N182 Chronic kidney disease, stage 2 (mild): Secondary | ICD-10-CM | POA: Diagnosis not present

## 2015-12-21 DIAGNOSIS — Z79899 Other long term (current) drug therapy: Secondary | ICD-10-CM | POA: Insufficient documentation

## 2015-12-21 DIAGNOSIS — Z87891 Personal history of nicotine dependence: Secondary | ICD-10-CM | POA: Insufficient documentation

## 2015-12-21 DIAGNOSIS — R0600 Dyspnea, unspecified: Secondary | ICD-10-CM | POA: Diagnosis present

## 2015-12-21 DIAGNOSIS — J441 Chronic obstructive pulmonary disease with (acute) exacerbation: Secondary | ICD-10-CM | POA: Insufficient documentation

## 2015-12-21 LAB — CBC WITH DIFFERENTIAL/PLATELET
BASOS ABS: 0 10*3/uL (ref 0–0.1)
BASOS PCT: 0 %
EOS ABS: 0 10*3/uL (ref 0–0.7)
Eosinophils Relative: 0 %
HEMATOCRIT: 33.6 % — AB (ref 35.0–47.0)
HEMOGLOBIN: 10.9 g/dL — AB (ref 12.0–16.0)
LYMPHS PCT: 18 %
Lymphs Abs: 2.4 10*3/uL (ref 1.0–3.6)
MCH: 28.7 pg (ref 26.0–34.0)
MCHC: 32.5 g/dL (ref 32.0–36.0)
MCV: 88.3 fL (ref 80.0–100.0)
MONOS PCT: 10 %
Monocytes Absolute: 1.3 10*3/uL — ABNORMAL HIGH (ref 0.2–0.9)
NEUTROS ABS: 9.4 10*3/uL — AB (ref 1.4–6.5)
NEUTROS PCT: 72 %
Platelets: 319 10*3/uL (ref 150–440)
RBC: 3.81 MIL/uL (ref 3.80–5.20)
RDW: 16.8 % — AB (ref 11.5–14.5)
WBC: 13.1 10*3/uL — ABNORMAL HIGH (ref 3.6–11.0)

## 2015-12-21 LAB — BASIC METABOLIC PANEL
ANION GAP: 8 (ref 5–15)
BUN: 44 mg/dL — ABNORMAL HIGH (ref 6–20)
CALCIUM: 8.7 mg/dL — AB (ref 8.9–10.3)
CHLORIDE: 102 mmol/L (ref 101–111)
CO2: 26 mmol/L (ref 22–32)
Creatinine, Ser: 1.12 mg/dL — ABNORMAL HIGH (ref 0.44–1.00)
GFR calc non Af Amer: 46 mL/min — ABNORMAL LOW (ref >60)
GFR, EST AFRICAN AMERICAN: 53 mL/min — AB (ref >60)
GLUCOSE: 145 mg/dL — AB (ref 65–99)
Potassium: 3.1 mmol/L — ABNORMAL LOW (ref 3.5–5.1)
Sodium: 136 mmol/L (ref 135–145)

## 2015-12-21 LAB — TROPONIN I: Troponin I: <0.03 ng/mL (ref <0.031)

## 2015-12-21 MED ORDER — IPRATROPIUM-ALBUTEROL 0.5-2.5 (3) MG/3ML IN SOLN
3.0000 mL | Freq: Once | RESPIRATORY_TRACT | Status: AC
  Administered 2015-12-21: 3 mL via RESPIRATORY_TRACT
  Filled 2015-12-21: qty 3

## 2015-12-21 MED ORDER — PANTOPRAZOLE SODIUM 40 MG PO TBEC: 40.0000 mg | DELAYED_RELEASE_TABLET | Freq: Every day | ORAL | Status: DC

## 2015-12-21 MED ORDER — PREDNISONE 10 MG (21) PO TBPK
10.0000 mg | ORAL_TABLET | Freq: Every day | ORAL | Status: DC
Start: 2015-12-21 — End: 2016-01-25

## 2015-12-21 MED ORDER — PREDNISONE 20 MG PO TABS: 60.0000 mg | ORAL_TABLET | Freq: Once | ORAL | Status: DC

## 2015-12-21 MED ORDER — MELATONIN 3 MG PO TABS: 1.0000 | ORAL_TABLET | Freq: Every evening | ORAL | Status: DC | PRN

## 2015-12-21 MED ORDER — HYDROCOD POLST-CPM POLST ER 10-8 MG/5ML PO SUER: 5.0000 mL | Freq: Two times a day (BID) | ORAL | Status: DC

## 2015-12-21 NOTE — Discharge Instructions (Signed)
Chronic Obstructive Pulmonary Disease Chronic obstructive pulmonary disease (COPD) is a common lung condition in which airflow from the lungs is limited. COPD is a general term that can be used to describe many different lung problems that limit airflow, including both chronic bronchitis and emphysema. If you have COPD, your lung function will probably never return to normal, but there are measures you can take to improve lung function and make yourself feel better. CAUSES   Smoking (common).  Exposure to secondhand smoke.  Genetic problems.  Chronic inflammatory lung diseases or recurrent infections. SYMPTOMS  Shortness of breath, especially with physical activity.  Deep, persistent (chronic) cough with a large amount of thick mucus.  Wheezing.  Rapid breaths (tachypnea).  Gray or bluish discoloration (cyanosis) of the skin, especially in your fingers, toes, or lips.  Fatigue.  Weight loss.  Frequent infections or episodes when breathing symptoms become much worse (exacerbations).  Chest tightness. DIAGNOSIS Your health care provider will take a medical history and perform a physical examination to diagnose COPD. Additional tests for COPD may include:  Lung (pulmonary) function tests.  Chest X-ray.  CT scan.  Blood tests. TREATMENT  Treatment for COPD may include:  Inhaler and nebulizer medicines. These help manage the symptoms of COPD and make your breathing more comfortable.  Supplemental oxygen. Supplemental oxygen is only helpful if you have a low oxygen level in your blood.  Exercise and physical activity. These are beneficial for nearly all people with COPD.  Lung surgery or transplant.  Nutrition therapy to gain weight, if you are underweight.  Pulmonary rehabilitation. This may involve working with a team of health care providers and specialists, such as respiratory, occupational, and physical therapists. HOME CARE INSTRUCTIONS  Take all medicines  (inhaled or pills) as directed by your health care provider.  Avoid over-the-counter medicines or cough syrups that dry up your airway (such as antihistamines) and slow down the elimination of secretions unless instructed otherwise by your health care provider.  If you are a smoker, the most important thing that you can do is stop smoking. Continuing to smoke will cause further lung damage and breathing trouble. Ask your health care provider for help with quitting smoking. He or she can direct you to community resources or hospitals that provide support.  Avoid exposure to irritants such as smoke, chemicals, and fumes that aggravate your breathing.  Use oxygen therapy and pulmonary rehabilitation if directed by your health care provider. If you require home oxygen therapy, ask your health care provider whether you should purchase a pulse oximeter to measure your oxygen level at home.  Avoid contact with individuals who have a contagious illness.  Avoid extreme temperature and humidity changes.  Eat healthy foods. Eating smaller, more frequent meals and resting before meals may help you maintain your strength.  Stay active, but balance activity with periods of rest. Exercise and physical activity will help you maintain your ability to do things you want to do.  Preventing infection and hospitalization is very important when you have COPD. Make sure to receive all the vaccines your health care provider recommends, especially the pneumococcal and influenza vaccines. Ask your health care provider whether you need a pneumonia vaccine.  Learn and use relaxation techniques to manage stress.  Learn and use controlled breathing techniques as directed by your health care provider. Controlled breathing techniques include:  Pursed lip breathing. Start by breathing in (inhaling) through your nose for 1 second. Then, purse your lips as if you were  going to whistle and breathe out (exhale) through the  pursed lips for 2 seconds.  Diaphragmatic breathing. Start by putting one hand on your abdomen just above your waist. Inhale slowly through your nose. The hand on your abdomen should move out. Then purse your lips and exhale slowly. You should be able to feel the hand on your abdomen moving in as you exhale.  Learn and use controlled coughing to clear mucus from your lungs. Controlled coughing is a series of short, progressive coughs. The steps of controlled coughing are:  Lean your head slightly forward.  Breathe in deeply using diaphragmatic breathing.  Try to hold your breath for 3 seconds.  Keep your mouth slightly open while coughing twice.  Spit any mucus out into a tissue.  Rest and repeat the steps once or twice as needed. SEEK MEDICAL CARE IF:  You are coughing up more mucus than usual.  There is a change in the color or thickness of your mucus.  Your breathing is more labored than usual.  Your breathing is faster than usual. SEEK IMMEDIATE MEDICAL CARE IF:  You have shortness of breath while you are resting.  You have shortness of breath that prevents you from:  Being able to talk.  Performing your usual physical activities.  You have chest pain lasting longer than 5 minutes.  Your skin color is more cyanotic than usual.  You measure low oxygen saturations for longer than 5 minutes with a pulse oximeter. MAKE SURE YOU:  Understand these instructions.  Will watch your condition.  Will get help right away if you are not doing well or get worse.   This information is not intended to replace advice given to you by your health care provider. Make sure you discuss any questions you have with your health care provider.   Document Released: 06/14/2005 Document Revised: 09/25/2014 Document Reviewed: 05/01/2013 Elsevier Interactive Patient Education 2016 [place].  Gastroesophageal Reflux Disease, Adult Normally, food travels down the esophagus and stays in  the stomach to be digested. However, when a person has gastroesophageal reflux disease (GERD), food and stomach acid move back up into the esophagus. When this happens, the esophagus becomes sore and inflamed. Over time, GERD can create small holes (ulcers) in the lining of the esophagus.  CAUSES This condition is caused by a problem with the muscle between the esophagus and the stomach (lower esophageal sphincter, or LES). Normally, the LES muscle closes after food passes through the esophagus to the stomach. When the LES is weakened or abnormal, it does not close properly, and that allows food and stomach acid to go back up into the esophagus. The LES can be weakened by certain dietary substances, medicines, and medical conditions, including:  Tobacco use.  Pregnancy.  Having a hiatal hernia.  Heavy alcohol use.  Certain foods and beverages, such as coffee, chocolate, onions, and peppermint. RISK FACTORS This condition is more likely to develop in:  People who have an increased body weight.  People who have connective tissue disorders.  People who use NSAID medicines. SYMPTOMS Symptoms of this condition include:  Heartburn.  Difficult or painful swallowing.  The feeling of having a lump in the throat.  Abitter taste in the mouth.  Bad breath.  Having a large amount of saliva.  Having an upset or bloated stomach.  Belching.  Chest pain.  Shortness of breath or wheezing.  Ongoing (chronic) cough or a night-time cough.  Wearing away of tooth enamel.  Weight loss.  Different conditions can cause chest pain. Make sure to see your health care provider if you experience chest pain. DIAGNOSIS Your health care provider will take a medical history and perform a physical exam. To determine if you have mild or severe GERD, your health care provider may also monitor how you respond to treatment. You may also have other tests, including:  An endoscopy toexamine your stomach  and esophagus with a small camera.  A test thatmeasures the acidity level in your esophagus.  A test thatmeasures how much pressure is on your esophagus.  A barium swallow or modified barium swallow to show the shape, size, and functioning of your esophagus. TREATMENT The goal of treatment is to help relieve your symptoms and to prevent complications. Treatment for this condition may vary depending on how severe your symptoms are. Your health care provider may recommend:  Changes to your diet.  Medicine.  Surgery. HOME CARE INSTRUCTIONS Diet  Follow a diet as recommended by your health care provider. This may involve avoiding foods and drinks such as:  Coffee and tea (with or without caffeine).  Drinks that containalcohol.  Energy drinks and sports drinks.  Carbonated drinks or sodas.  Chocolate and cocoa.  Peppermint and mint flavorings.  Garlic and onions.  Horseradish.  Spicy and acidic foods, including peppers, chili powder, curry powder, vinegar, hot sauces, and barbecue sauce.  Citrus fruit juices and citrus fruits, such as oranges, lemons, and limes.  Tomato-based foods, such as red sauce, chili, salsa, and pizza with red sauce.  Fried and fatty foods, such as donuts, french fries, potato chips, and high-fat dressings.  High-fat meats, such as hot dogs and fatty cuts of red and white meats, such as rib eye steak, sausage, ham, and bacon.  High-fat dairy items, such as whole milk, butter, and cream cheese.  Eat small, frequent meals instead of large meals.  Avoid drinking large amounts of liquid with your meals.  Avoid eating meals during the 2-3 hours before bedtime.  Avoid lying down right after you eat.  Do not exercise right after you eat. General Instructions  Pay attention to any changes in your symptoms.  Take over-the-counter and prescription medicines only as told by your health care provider. Do not take aspirin, ibuprofen, or other  NSAIDs unless your health care provider told you to do so.  Do not use any tobacco products, including cigarettes, chewing tobacco, and e-cigarettes. If you need help quitting, ask your health care provider.  Wear loose-fitting clothing. Do not wear anything tight around your waist that causes pressure on your abdomen.  Raise (elevate) the head of your bed 6 inches (15cm).  Try to reduce your stress, such as with yoga or meditation. If you need help reducing stress, ask your health care provider.  If you are overweight, reduce your weight to an amount that is healthy for you. Ask your health care provider for guidance about a safe weight loss goal.  Keep all follow-up visits as told by your health care provider. This is important. SEEK MEDICAL CARE IF:  You have new symptoms.  You have unexplained weight loss.  You have difficulty swallowing, or it hurts to swallow.  You have wheezing or a persistent cough.  Your symptoms do not improve with treatment.  You have a hoarse voice. SEEK IMMEDIATE MEDICAL CARE IF:  You have pain in your arms, neck, jaw, teeth, or back.  You feel sweaty, dizzy, or light-headed.  You have chest pain or shortness  of breath.  You vomit and your vomit looks like blood or coffee grounds.  You faint.  Your stool is bloody or black.  You cannot swallow, drink, or eat.   This information is not intended to replace advice given to you by your health care provider. Make sure you discuss any questions you have with your health care provider.   Document Released: 06/14/2005 Document Revised: 05/26/2015 Document Reviewed: 12/30/2014 Elsevier Interactive Patient Education 2016 [place].  Insomnia Insomnia is a sleep disorder that makes it difficult to fall asleep or to stay asleep. Insomnia can cause tiredness (fatigue), low energy, difficulty concentrating, mood swings, and poor performance at work or school.  There are three different ways to  classify insomnia:  Difficulty falling asleep.  Difficulty staying asleep.  Waking up too early in the morning. Any type of insomnia can be long-term (chronic) or short-term (acute). Both are common. Short-term insomnia usually lasts for three months or less. Chronic insomnia occurs at least three times a week for longer than three months. CAUSES  Insomnia may be caused by another condition, situation, or substance, such as:  Anxiety.  Certain medicines.  Gastroesophageal reflux disease (GERD) or other gastrointestinal conditions.  Asthma or other breathing conditions.  Restless legs syndrome, sleep apnea, or other sleep disorders.  Chronic pain.  Menopause. This may include hot flashes.  Stroke.  Abuse of alcohol, tobacco, or illegal drugs.  Depression.  Caffeine.   Neurological disorders, such as Alzheimer disease.  An overactive thyroid (hyperthyroidism). The cause of insomnia may not be known. RISK FACTORS Risk factors for insomnia include:  Gender. Women are more commonly affected than men.  Age. Insomnia is more common as you get older.  Stress. This may involve your professional or personal life.  Income. Insomnia is more common in people with lower income.  Lack of exercise.   Irregular work schedule or night shifts.  Traveling between different time zones. SIGNS AND SYMPTOMS If you have insomnia, trouble falling asleep or trouble staying asleep is the main symptom. This may lead to other symptoms, such as:  Feeling fatigued.  Feeling nervous about going to sleep.  Not feeling rested in the morning.  Having trouble concentrating.  Feeling irritable, anxious, or depressed. TREATMENT  Treatment for insomnia depends on the cause. If your insomnia is caused by an underlying condition, treatment will focus on addressing the condition. Treatment may also include:   Medicines to help you sleep.  Counseling or therapy.  Lifestyle  adjustments. HOME CARE INSTRUCTIONS   Take medicines only as directed by your health care provider.  Keep regular sleeping and waking hours. Avoid naps.  Keep a sleep diary to help you and your health care provider figure out what could be causing your insomnia. Include:   When you sleep.  When you wake up during the night.  How well you sleep.   How rested you feel the next day.  Any side effects of medicines you are taking.  What you eat and drink.   Make your bedroom a comfortable place where it is easy to fall asleep:  Put up shades or special blackout curtains to block light from outside.  Use a white noise machine to block noise.  Keep the temperature cool.   Exercise regularly as directed by your health care provider. Avoid exercising right before bedtime.  Use relaxation techniques to manage stress. Ask your health care provider to suggest some techniques that may work well for you. These may  include:  Breathing exercises.  Routines to release muscle tension.  Visualizing peaceful scenes.  Cut back on alcohol, caffeinated beverages, and cigarettes, especially close to bedtime. These can disrupt your sleep.  Do not overeat or eat spicy foods right before bedtime. This can lead to digestive discomfort that can make it hard for you to sleep.  Limit screen use before bedtime. This includes:  Watching TV.  Using your smartphone, tablet, and computer.  Stick to a routine. This can help you fall asleep faster. Try to do a quiet activity, brush your teeth, and go to bed at the same time each night.  Get out of bed if you are still awake after 15 minutes of trying to sleep. Keep the lights down, but try reading or doing a quiet activity. When you feel sleepy, go back to bed.  Make sure that you drive carefully. Avoid driving if you feel very sleepy.  Keep all follow-up appointments as directed by your health care provider. This is important. SEEK MEDICAL  CARE IF:   You are tired throughout the day or have trouble in your daily routine due to sleepiness.  You continue to have sleep problems or your sleep problems get worse. SEEK IMMEDIATE MEDICAL CARE IF:   You have serious thoughts about hurting yourself or someone else.   This information is not intended to replace advice given to you by your health care provider. Make sure you discuss any questions you have with your health care provider.   Document Released: 09/01/2000 Document Revised: 05/26/2015 Document Reviewed: 06/05/2014 Elsevier Interactive Patient Education [employer].

## 2015-12-21 NOTE — ED Notes (Addendum)
Pt arrives to ED via [facility] d/t c/o difficulty breathing. EMS reports pt has been seen several time recently for same. EMS states pt given 2 Albuterol t/x's in route (folllowing 1 Albuterol treatment pt self-administered prior to EMS arrival at residence), '20mg'$  Pepcid, '4mg'$  Zofran, '125mg'$  Solu-Medrol, and 0.'3mg'$  Epi SQ. Pt arrives with aerosol mask in place, A&O, somewhat anxious.

## 2015-12-21 NOTE — ED Provider Notes (Signed)
[facility] Emergency Department Provider Note     Time seen: ----------------------------------------- 7:19 AM on 12/21/2015 -----------------------------------------    I have reviewed the triage vital signs and the nursing notes.   HISTORY  Chief Complaint Respiratory Distress    HPI Toni [name] is a 81 y.o. female who presents ER being brought by EMS for difficulty breathing. EMS reports patient has been seen several times recently for same. They gave her 2 albuterol treatments in route as well as Solu-Medrol.She currently states she is breathing better, denies any recent illness, fevers, chest pain or other complaints. She was noted to be anxious on arrival.   Past Medical History  Diagnosis Date  . Cataract   . COPD (chronic obstructive pulmonary disease) ([place])   . Essential hypertension, benign   . Hypothyroidism   . Atypical atrial flutter ([place]) 11/16/2013    a. CHA2DS2VASc = 5-->prev on xarelto, d/c'd due to bleeding skin tear;  c. 11/2013->Placed on flecainide.  . Chronic diastolic heart failure ([place])   . PAF (paroxysmal atrial fibrillation) ([place])     a. 11/2013 Echo: EF 55-60%, mild AS/MR, mod dil LA/RA, PASP 27mHg;  b. CHA2DS2VASc = 5-->prev on xarelto, d/c'd due to bleeding skin tear;  c. 11/2013->Placed on flecainide.  . Lung nodule     Followed by Dr. FRaul Del . 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 Lung cancer ([place]   . Collagen vascular disease ([facility]   . Hypokalemia   . Chronic diastolic CHF (congestive heart failure) ([place]     a. 11/2013 Echo: EF 55-60%, mild AS/MR, mod dil LA/RA, PASP 327mg.    Patient Active Problem List   Diagnosis Date Noted  . Encounter for anticoagulation discussion and counseling 11/17/2015  . Fall 11/17/2015  . Essential hypertension, benign   . Lung infiltrate on CT 06/08/2015  . Chronic diastolic CHF (congestive heart failure) (HCBrooksville08/29/2016  . Insomnia 04/17/2014  .  Squamous cell lung cancer (HCCleveland07/06/2014  . Postnasal drip 03/05/2014  . Hyperlipidemia 12/04/2013  . Leg edema 12/04/2013  . Atypical atrial flutter (HCDot[place]03/09/2013  . COPD (chronic obstructive pulmonary disease) (HCRobinson02/28/2015  . Chronic kidney disease 08/24/2013  . Essential (primary) hypertension 08/24/2013  . Adult hypothyroidism 08/24/2013  . Acute exacerbation of chronic obstructive airways disease (HCMidway City12/03/2013  . Chronic obstructive pulmonary disease with acute exacerbation (HCCripple Creek12/03/2013    Past Surgical History  Procedure Laterality Date  . Eye surgery    . Total knee arthroplasty Bilateral     Allergies Belladonna alkaloids  Social History Social History  Substance Use Topics  . Smoking status: Former Smoker -- 1.50 packs/day for 50 years    Types: Cigarettes    Quit date: 09/18/2002  . Smokeless tobacco: Never Used  . Alcohol Use: No    Review of Systems Constitutional: Negative for fever. Eyes: Negative for visual changes. ENT: Negative for sore throat. Cardiovascular: Negative for chest pain. Respiratory: Positive for shortness of breath Gastrointestinal: Negative for abdominal pain, vomiting and diarrhea. Genitourinary: Negative for dysuria. Musculoskeletal: Negative for back pain. Skin: Negative for rash. Neurological: Negative for headaches, focal weakness or numbness.  10-point ROS otherwise negative.  ____________________________________________   PHYSICAL EXAM:  VITAL SIGNS: ED Triage Vitals  Enc Vitals Group     BP 12/21/15 0657 160/69 mmHg     Pulse Rate 12/21/15 0657 74     Resp 12/21/15 0657 18     Temp --  Temp src --      SpO2 12/21/15 0657 100 %     Weight 12/21/15 0657 171 lb (77.565 kg)     Height 12/21/15 0657 _0  (1.575 m)     Head Cir --      Peak Flow --      Pain Score 12/21/15 0658 8     Pain Loc --      Pain Edu? --      Excl. in [place]? --     Constitutional: Alert and oriented. Well appearing and  in no distress. Eyes: Conjunctivae are normal. PERRL. Normal extraocular movements. ENT   Head: Normocephalic and atraumatic.   Nose: No congestion/rhinnorhea.   Mouth/Throat: Mucous membranes are moist.   Neck: No stridor. Cardiovascular: Normal rate, regular rhythm. Normal and symmetric distal pulses are present in all extremities. No murmurs, rubs, or gallops. Respiratory: Prolonged expirations with wheezing bilaterally. Gastrointestinal: Soft and nontender. No distention. No abdominal bruits.  Musculoskeletal: Nontender with normal range of motion in all extremities. No joint effusions.  No lower extremity tenderness nor edema. Neurologic:  Normal speech and language. No gross focal neurologic deficits are appreciated.  Skin:  Skin is warm, dry and intact. Ecchymosis is noted across her chest Psychiatric: Mood and affect are normal. Speech and behavior are normal. Patient exhibits appropriate insight and judgment. ____________________________________________  EKG: Interpreted by me. Normal sinus rhythm with rate of 77 bpm, first degree AV block, normal QRS, normal QT interval. Normal axis.  ____________________________________________  ED COURSE:  Pertinent labs & imaging results that were available during my care of the patient were reviewed by me and considered in my medical decision making (see chart for details). Patient does not appear to be in any acute distress on arrival. She has a long history of COPD, recently seen for same. ____________________________________________    LABS (pertinent positives/negatives)  Labs Reviewed  CBC WITH DIFFERENTIAL/PLATELET - Abnormal; Notable for the following:    WBC 13.1 (*)    Hemoglobin 10.9 (*)    HCT 33.6 (*)    RDW 16.8 (*)    Neutro Abs 9.4 (*)    Monocytes Absolute 1.3 (*)    All other components within normal limits  BASIC METABOLIC PANEL - Abnormal; Notable for the following:    Potassium 3.1 (*)    Glucose,  Bld 145 (*)    BUN 44 (*)    Creatinine, Ser 1.12 (*)    Calcium 8.7 (*)    GFR calc non Af Amer 46 (*)    GFR calc Af Amer 53 (*)    All other components within normal limits  TROPONIN I    RADIOLOGY Images were viewed by me  Chest x-ray IMPRESSION: 1. No acute cardiopulmonary disease or significant interval change. 2. Borderline cardiomegaly without failure. 3. Chronic interstitial coarsening. ____________________________________________  FINAL ASSESSMENT AND PLAN  COPD exacerbation  Plan: Patient with labs and imaging as dictated above. Patient's labs and findings are consistent with prior COPD exacerbations. She'll be discharged with steroid taper and encouraged to have close follow-up with her pulmonologist.   Earleen Newport, MD   Earleen Newport, MD 12/21/15 [phone]

## 2016-01-10 ENCOUNTER — Telehealth: Payer: Self-pay | Admitting: Internal Medicine

## 2016-01-10 NOTE — Telephone Encounter (Signed)
Received call from ER Physician (Dr. Lennox Grumbles) at [facility] ER, stated that patient is being treated for AECOPD, doing better after one dose of steroids and neb treatments.  Dr. Lennox Grumbles inquired about starting low dose chronic prednisone tx, '5mg'$  daily, I advised her that upon discharge, give patient rx for prednisone taper, '30mg'$ , taper over 10 days.  Pulmonary will make decision and discuss with patient about chronic steroid tx upon follow up visit with Dr. Mortimer Fries in the next 1-2 weeks.   -VM

## 2016-01-10 NOTE — Telephone Encounter (Signed)
Spoke with pt and f/u appt scheduled.

## 2016-01-25 ENCOUNTER — Ambulatory Visit (INDEPENDENT_AMBULATORY_CARE_PROVIDER_SITE_OTHER): Payer: Medicare Other | Admitting: Internal Medicine

## 2016-01-25 ENCOUNTER — Encounter: Payer: Self-pay | Admitting: Internal Medicine

## 2016-01-25 VITALS — BP 128/76 | HR 76 | Ht 61.0 in | Wt 177.0 lb

## 2016-01-25 DIAGNOSIS — J449 Chronic obstructive pulmonary disease, unspecified: Secondary | ICD-10-CM

## 2016-01-25 NOTE — Progress Notes (Signed)
[facility] Consultation      Date: 01/25/2016,   MRN# [id number] RON JUNCO 08-15-1935 Code Status:  Hosp day:'@LENGTHOFSTAYDAYS'$ @ Referring MD: '@ATDPROV'$ @     PCP:      AdmissionWeight: 177 lb (80.287 kg)                 CurrentWeight: 177 lb (80.287 kg)      CHIEF COMPLAINT:   follow up for COPD   HISTORY OF PRESENT ILLNESS   81 yo pleasant white female with h/o COPD and Stage 1 SQ cell cancer dx in 2015 s/p Ct guided biopsy s/p RXT Follow up COPD-patient doing well with breathing-had 1 round of prednisone 2 weeks-feels better Patient has no signs of infection at this time Intermittent wheezing, no cough, no SOB, uses alb as needed    Current Medication:  Current outpatient prescriptions:  .  albuterol (PROVENTIL HFA;VENTOLIN HFA) 108 (90 BASE) MCG/ACT inhaler, Inhale 1-2 puffs into the lungs every 6 (six) hours as needed for wheezing or shortness of breath., Disp: 1 Inhaler, Rfl: 12 .  budesonide (PULMICORT) 0.5 MG/2ML nebulizer solution, Take 2 mLs (0.5 mg total) by nebulization 2 (two) times daily., Disp: 120 mL, Rfl: 11 .  chlorpheniramine-HYDROcodone (TUSSIONEX PENNKINETIC ER) 10-8 MG/5ML SUER, Take 5 mLs by mouth 2 (two) times daily., Disp: 140 mL, Rfl: 0 .  diltiazem (TIAZAC) 300 MG 24 hr capsule, Take 1 capsule (300 mg total) by mouth daily., Disp: 30 capsule, Rfl: 11 .  flecainide (TAMBOCOR) 100 MG tablet, Take 1 tablet (100 mg total) by mouth 2 (two) times daily., Disp: 60 tablet, Rfl: 11 .  formoterol (PERFOROMIST) 20 MCG/2ML nebulizer solution, Take 2 mLs (20 mcg total) by nebulization 2 (two) times daily., Disp: 2 mL, Rfl: 12 .  furosemide (LASIX) 40 MG tablet, Take 40 mg by mouth daily., Disp: , Rfl:  .  ipratropium-albuterol (DUONEB) 0.5-2.5 (3) MG/3ML SOLN, Take 3 mLs by nebulization daily as needed., Disp: 90 mL, Rfl: 6 .  levothyroxine (SYNTHROID, LEVOTHROID) 75 MCG tablet, Take 75 mcg by mouth daily before breakfast., Disp: , Rfl:    .  Melatonin 3 MG TABS, Take 1 tablet (3 mg total) by mouth at bedtime as needed., Disp: 30 tablet, Rfl: 1 .  Omega-3 Fatty Acids (FISH OIL) 1000 MG CAPS, Take 1,000 mg by mouth daily., Disp: , Rfl:  .  pantoprazole (PROTONIX) 40 MG tablet, Take 1 tablet (40 mg total) by mouth daily., Disp: 30 tablet, Rfl: 1 .  potassium chloride (MICRO-K) 10 MEQ CR capsule, Take 10 mEq by mouth daily., Disp: , Rfl:     ALLERGIES   Belladonna alkaloids     REVIEW OF SYSTEMS   Review of Systems  Constitutional: Negative for fever, chills and weight loss.  HENT: Negative for congestion.   Eyes: Negative for blurred vision.  Respiratory: Negative for cough, hemoptysis, sputum production, shortness of breath and wheezing.   Cardiovascular: Negative for chest pain.  All other systems reviewed and are negative.    VS: BP 128/76 mmHg  Pulse 76  Ht '5\' 1"'$  (1.549 m)  Wt 177 lb (80.287 kg)  BMI 33.46 kg/m2  SpO2 98%     PHYSICAL EXAM  Physical Exam  Constitutional: She is oriented to person, place, and time. She appears well-developed.  HENT:  Head: Normocephalic and atraumatic.  Eyes: Pupils are equal, round, and reactive to light.  Cardiovascular: Normal rate, regular rhythm and normal heart sounds.   No murmur heard.  Pulmonary/Chest: Effort normal and breath sounds normal. No stridor. No respiratory distress. She has no wheezes.  Musculoskeletal: She exhibits no edema.  Neurological: She is alert and oriented to person, place, and time.  Skin: Skin is warm.  Psychiatric: She has a normal mood and affect.         ASSESSMENT/PLAN    81 yo white female with moderate COPD GOLD stage B which is stable at this time. H/o Squamous cell carcinoma of lung T1 N1 M0 tumor status post radiation therapy with stereotactic surgery  COPD (chronic obstructive pulmonary disease) -will continue inhaled medications as prescribed-this seems to help her breathing -continue oxygen at night  RUL patchy  GGO/h/o RUL lung cancer -repeat CT chest pending.follow up oncology [place] cell Lung Cancer-follow up with oncology  The Patient requires high complexity decision making for assessment and support, frequent evaluation and titration of therapies, application of advanced monitoring technologies and extensive interpretation of multiple databases.  Patient/Family is satisfied with Plan of action and management.    Corrin Parker, M.D.  [facility]  Medical Director [place] Director [facility] Cardio-Pulmonary Department

## 2016-01-25 NOTE — Patient Instructions (Signed)
Chronic Obstructive Pulmonary Disease Chronic obstructive pulmonary disease (COPD) is a common lung condition in which airflow from the lungs is limited. COPD is a general term that can be used to describe many different lung problems that limit airflow, including both chronic bronchitis and emphysema. If you have COPD, your lung function will probably never return to normal, but there are measures you can take to improve lung function and make yourself feel better. CAUSES   Smoking (common).  Exposure to secondhand smoke.  Genetic problems.  Chronic inflammatory lung diseases or recurrent infections. SYMPTOMS  Shortness of breath, especially with physical activity.  Deep, persistent (chronic) cough with a large amount of thick mucus.  Wheezing.  Rapid breaths (tachypnea).  Gray or bluish discoloration (cyanosis) of the skin, especially in your fingers, toes, or lips.  Fatigue.  Weight loss.  Frequent infections or episodes when breathing symptoms become much worse (exacerbations).  Chest tightness. DIAGNOSIS Your health care provider will take a medical history and perform a physical examination to diagnose COPD. Additional tests for COPD may include:  Lung (pulmonary) function tests.  Chest X-ray.  CT scan.  Blood tests. TREATMENT  Treatment for COPD may include:  Inhaler and nebulizer medicines. These help manage the symptoms of COPD and make your breathing more comfortable.  Supplemental oxygen. Supplemental oxygen is only helpful if you have a low oxygen level in your blood.  Exercise and physical activity. These are beneficial for nearly all people with COPD.  Lung surgery or transplant.  Nutrition therapy to gain weight, if you are underweight.  Pulmonary rehabilitation. This may involve working with a team of health care providers and specialists, such as respiratory, occupational, and physical therapists. HOME CARE INSTRUCTIONS  Take all medicines  (inhaled or pills) as directed by your health care provider.  Avoid over-the-counter medicines or cough syrups that dry up your airway (such as antihistamines) and slow down the elimination of secretions unless instructed otherwise by your health care provider.  If you are a smoker, the most important thing that you can do is stop smoking. Continuing to smoke will cause further lung damage and breathing trouble. Ask your health care provider for help with quitting smoking. He or she can direct you to community resources or hospitals that provide support.  Avoid exposure to irritants such as smoke, chemicals, and fumes that aggravate your breathing.  Use oxygen therapy and pulmonary rehabilitation if directed by your health care provider. If you require home oxygen therapy, ask your health care provider whether you should purchase a pulse oximeter to measure your oxygen level at home.  Avoid contact with individuals who have a contagious illness.  Avoid extreme temperature and humidity changes.  Eat healthy foods. Eating smaller, more frequent meals and resting before meals may help you maintain your strength.  Stay active, but balance activity with periods of rest. Exercise and physical activity will help you maintain your ability to do things you want to do.  Preventing infection and hospitalization is very important when you have COPD. Make sure to receive all the vaccines your health care provider recommends, especially the pneumococcal and influenza vaccines. Ask your health care provider whether you need a pneumonia vaccine.  Learn and use relaxation techniques to manage stress.  Learn and use controlled breathing techniques as directed by your health care provider. Controlled breathing techniques include:  Pursed lip breathing. Start by breathing in (inhaling) through your nose for 1 second. Then, purse your lips as if you were   going to whistle and breathe out (exhale) through the  pursed lips for 2 seconds.  Diaphragmatic breathing. Start by putting one hand on your abdomen just above your waist. Inhale slowly through your nose. The hand on your abdomen should move out. Then purse your lips and exhale slowly. You should be able to feel the hand on your abdomen moving in as you exhale.  Learn and use controlled coughing to clear mucus from your lungs. Controlled coughing is a series of short, progressive coughs. The steps of controlled coughing are: 1. Lean your head slightly forward. 2. Breathe in deeply using diaphragmatic breathing. 3. Try to hold your breath for 3 seconds. 4. Keep your mouth slightly open while coughing twice. 5. Spit any mucus out into a tissue. 6. Rest and repeat the steps once or twice as needed. SEEK MEDICAL CARE IF:  You are coughing up more mucus than usual.  There is a change in the color or thickness of your mucus.  Your breathing is more labored than usual.  Your breathing is faster than usual. SEEK IMMEDIATE MEDICAL CARE IF:  You have shortness of breath while you are resting.  You have shortness of breath that prevents you from:  Being able to talk.  Performing your usual physical activities.  You have chest pain lasting longer than 5 minutes.  Your skin color is more cyanotic than usual.  You measure low oxygen saturations for longer than 5 minutes with a pulse oximeter. MAKE SURE YOU:  Understand these instructions.  Will watch your condition.  Will get help right away if you are not doing well or get worse.   This information is not intended to replace advice given to you by your health care provider. Make sure you discuss any questions you have with your health care provider.   Document Released: 06/14/2005 Document Revised: 09/25/2014 Document Reviewed: 05/01/2013 Elsevier Interactive Patient Education 2016 Elsevier Inc.  

## 2016-02-16 ENCOUNTER — Emergency Department: Payer: Medicare Other

## 2016-02-16 ENCOUNTER — Telehealth: Payer: Self-pay | Admitting: Internal Medicine

## 2016-02-16 ENCOUNTER — Inpatient Hospital Stay
Admission: EM | Admit: 2016-02-16 | Discharge: 2016-02-26 | DRG: 190 | Disposition: A | Discharge status: Home / Self Care | Payer: Medicare Other | Attending: Internal Medicine | Admitting: Internal Medicine

## 2016-02-16 ENCOUNTER — Ambulatory Visit (INDEPENDENT_AMBULATORY_CARE_PROVIDER_SITE_OTHER): Payer: Medicare Other | Admitting: Pulmonary Disease

## 2016-02-16 ENCOUNTER — Encounter: Payer: Self-pay | Admitting: Pulmonary Disease

## 2016-02-16 VITALS — BP 130/78 | HR 71 | Ht 63.0 in | Wt 183.0 lb

## 2016-02-16 DIAGNOSIS — I082 Rheumatic disorders of both aortic and tricuspid valves: Secondary | ICD-10-CM | POA: Diagnosis present

## 2016-02-16 DIAGNOSIS — Z85118 Personal history of other malignant neoplasm of bronchus and lung: Secondary | ICD-10-CM

## 2016-02-16 DIAGNOSIS — F4322 Adjustment disorder with anxiety: Secondary | ICD-10-CM

## 2016-02-16 DIAGNOSIS — Z9981 Dependence on supplemental oxygen: Secondary | ICD-10-CM | POA: Diagnosis not present

## 2016-02-16 DIAGNOSIS — I130 Hypertensive heart and chronic kidney disease with heart failure and stage 1 through stage 4 chronic kidney disease, or unspecified chronic kidney disease: Secondary | ICD-10-CM | POA: Diagnosis present

## 2016-02-16 DIAGNOSIS — I272 Other secondary pulmonary hypertension: Secondary | ICD-10-CM | POA: Diagnosis present

## 2016-02-16 DIAGNOSIS — N183 Chronic kidney disease, stage 3 (moderate): Secondary | ICD-10-CM | POA: Diagnosis present

## 2016-02-16 DIAGNOSIS — I480 Paroxysmal atrial fibrillation: Secondary | ICD-10-CM | POA: Diagnosis present

## 2016-02-16 DIAGNOSIS — Z7951 Long term (current) use of inhaled steroids: Secondary | ICD-10-CM

## 2016-02-16 DIAGNOSIS — J432 Centrilobular emphysema: Secondary | ICD-10-CM

## 2016-02-16 DIAGNOSIS — R0902 Hypoxemia: Secondary | ICD-10-CM

## 2016-02-16 DIAGNOSIS — N179 Acute kidney failure, unspecified: Secondary | ICD-10-CM | POA: Diagnosis not present

## 2016-02-16 DIAGNOSIS — E039 Hypothyroidism, unspecified: Secondary | ICD-10-CM | POA: Diagnosis present

## 2016-02-16 DIAGNOSIS — F329 Major depressive disorder, single episode, unspecified: Secondary | ICD-10-CM | POA: Diagnosis present

## 2016-02-16 DIAGNOSIS — J9621 Acute and chronic respiratory failure with hypoxia: Secondary | ICD-10-CM | POA: Diagnosis present

## 2016-02-16 DIAGNOSIS — Z87891 Personal history of nicotine dependence: Secondary | ICD-10-CM

## 2016-02-16 DIAGNOSIS — K219 Gastro-esophageal reflux disease without esophagitis: Secondary | ICD-10-CM | POA: Diagnosis present

## 2016-02-16 DIAGNOSIS — J441 Chronic obstructive pulmonary disease with (acute) exacerbation: Secondary | ICD-10-CM | POA: Diagnosis present

## 2016-02-16 DIAGNOSIS — K5900 Constipation, unspecified: Secondary | ICD-10-CM | POA: Diagnosis present

## 2016-02-16 DIAGNOSIS — I5032 Chronic diastolic (congestive) heart failure: Secondary | ICD-10-CM | POA: Diagnosis present

## 2016-02-16 DIAGNOSIS — Z923 Personal history of irradiation: Secondary | ICD-10-CM

## 2016-02-16 DIAGNOSIS — R1013 Epigastric pain: Secondary | ICD-10-CM | POA: Insufficient documentation

## 2016-02-16 DIAGNOSIS — Z8249 Family history of ischemic heart disease and other diseases of the circulatory system: Secondary | ICD-10-CM

## 2016-02-16 DIAGNOSIS — Z96653 Presence of artificial knee joint, bilateral: Secondary | ICD-10-CM | POA: Diagnosis present

## 2016-02-16 DIAGNOSIS — I5031 Acute diastolic (congestive) heart failure: Secondary | ICD-10-CM | POA: Diagnosis not present

## 2016-02-16 DIAGNOSIS — I484 Atypical atrial flutter: Secondary | ICD-10-CM | POA: Diagnosis present

## 2016-02-16 DIAGNOSIS — Z79899 Other long term (current) drug therapy: Secondary | ICD-10-CM

## 2016-02-16 DIAGNOSIS — R0602 Shortness of breath: Secondary | ICD-10-CM

## 2016-02-16 DIAGNOSIS — I5033 Acute on chronic diastolic (congestive) heart failure: Secondary | ICD-10-CM | POA: Diagnosis not present

## 2016-02-16 DIAGNOSIS — I10 Essential (primary) hypertension: Secondary | ICD-10-CM | POA: Diagnosis present

## 2016-02-16 DIAGNOSIS — J9600 Acute respiratory failure, unspecified whether with hypoxia or hypercapnia: Secondary | ICD-10-CM | POA: Diagnosis not present

## 2016-02-16 LAB — BASIC METABOLIC PANEL
Anion gap: 11 (ref 5–15)
BUN: 31 mg/dL — ABNORMAL HIGH (ref 6–20)
CALCIUM: 9.4 mg/dL (ref 8.9–10.3)
CO2: 29 mmol/L (ref 22–32)
CREATININE: 1.22 mg/dL — AB (ref 0.44–1.00)
Chloride: 101 mmol/L (ref 101–111)
GFR, EST AFRICAN AMERICAN: 48 mL/min — AB (ref >60)
GFR, EST NON AFRICAN AMERICAN: 41 mL/min — AB (ref >60)
GLUCOSE: 131 mg/dL — AB (ref 65–99)
Potassium: 3.8 mmol/L (ref 3.5–5.1)
Sodium: 141 mmol/L (ref 135–145)

## 2016-02-16 LAB — CBC WITH DIFFERENTIAL/PLATELET
Basophils Absolute: 0.1 10*3/uL (ref 0–0.1)
Basophils Relative: 1 %
Eosinophils Absolute: 0.1 10*3/uL (ref 0–0.7)
HCT: 36.8 % (ref 35.0–47.0)
Hemoglobin: 12 g/dL (ref 12.0–16.0)
LYMPHS ABS: 0.8 10*3/uL — AB (ref 1.0–3.6)
MCH: 29.4 pg (ref 26.0–34.0)
MCHC: 32.5 g/dL (ref 32.0–36.0)
MCV: 90.3 fL (ref 80.0–100.0)
MONO ABS: 0.3 10*3/uL (ref 0.2–0.9)
Neutro Abs: 6.2 10*3/uL (ref 1.4–6.5)
Neutrophils Relative %: 83 %
PLATELETS: 228 10*3/uL (ref 150–440)
RBC: 4.08 MIL/uL (ref 3.80–5.20)
RDW: 16.6 % — AB (ref 11.5–14.5)
WBC: 7.4 10*3/uL (ref 3.6–11.0)

## 2016-02-16 LAB — TROPONIN I: Troponin I: 0.03 ng/mL (ref <0.031)

## 2016-02-16 MED ORDER — ALUM & MAG HYDROXIDE-SIMETH 200-200-20 MG/5ML PO SUSP
ORAL | Status: AC
  Administered 2016-02-16: 30 mL via ORAL
  Filled 2016-02-16: qty 30

## 2016-02-16 MED ORDER — AZITHROMYCIN 250 MG PO TABS
500.0000 mg | ORAL_TABLET | Freq: Every day | ORAL | Status: DC
Start: 2016-02-16 — End: 2016-02-18
  Administered 2016-02-16 – 2016-02-18 (×3): 500 mg via ORAL
  Filled 2016-02-16 (×3): qty 2

## 2016-02-16 MED ORDER — LEVOFLOXACIN 250 MG PO TABS: 250.0000 mg | ORAL_TABLET | Freq: Every day | ORAL | Status: DC

## 2016-02-16 MED ORDER — PANTOPRAZOLE SODIUM 40 MG PO TBEC
40.0000 mg | DELAYED_RELEASE_TABLET | Freq: Every day | ORAL | Status: DC
  Administered 2016-02-16 – 2016-02-18 (×3): 40 mg via ORAL
  Filled 2016-02-16 (×3): qty 1

## 2016-02-16 MED ORDER — ALBUTEROL SULFATE HFA 108 (90 BASE) MCG/ACT IN AERS: 2.0000 | INHALATION_SPRAY | RESPIRATORY_TRACT | Status: DC | PRN

## 2016-02-16 MED ORDER — ACETAMINOPHEN 650 MG RE SUPP
650.0000 mg | Freq: Four times a day (QID) | RECTAL | Status: DC | PRN
Start: 2016-02-16 — End: 2016-02-26

## 2016-02-16 MED ORDER — BUDESONIDE 0.5 MG/2ML IN SUSP
0.5000 mg | Freq: Two times a day (BID) | RESPIRATORY_TRACT | Status: DC
  Administered 2016-02-16: 23:00:00 0.5 mg via RESPIRATORY_TRACT
  Filled 2016-02-16: qty 2

## 2016-02-16 MED ORDER — LEVOTHYROXINE SODIUM 75 MCG PO TABS
75.0000 ug | ORAL_TABLET | Freq: Every day | ORAL | Status: DC
  Administered 2016-02-17 – 2016-02-26 (×10): 75 ug via ORAL
  Filled 2016-02-16 (×10): qty 1

## 2016-02-16 MED ORDER — ONDANSETRON HCL 4 MG/2ML IJ SOLN
4.0000 mg | Freq: Four times a day (QID) | INTRAMUSCULAR | Status: DC | PRN
  Administered 2016-02-17 – 2016-02-23 (×3): 4 mg via INTRAVENOUS
  Filled 2016-02-16 (×3): qty 2

## 2016-02-16 MED ORDER — FLECAINIDE ACETATE 100 MG PO TABS
100.0000 mg | ORAL_TABLET | Freq: Two times a day (BID) | ORAL | Status: DC
  Administered 2016-02-16 – 2016-02-26 (×20): 100 mg via ORAL
  Filled 2016-02-16 (×22): qty 1

## 2016-02-16 MED ORDER — ALBUTEROL SULFATE (2.5 MG/3ML) 0.083% IN NEBU
5.0000 mg | INHALATION_SOLUTION | Freq: Once | RESPIRATORY_TRACT | Status: AC
  Administered 2016-02-16: 5 mg via RESPIRATORY_TRACT
  Filled 2016-02-16: qty 6

## 2016-02-16 MED ORDER — BUDESONIDE 0.5 MG/2ML IN SUSP
0.5000 mg | Freq: Two times a day (BID) | RESPIRATORY_TRACT | Status: DC
  Administered 2016-02-17 – 2016-02-26 (×19): 0.5 mg via RESPIRATORY_TRACT
  Filled 2016-02-16 (×19): qty 2

## 2016-02-16 MED ORDER — DILTIAZEM HCL ER COATED BEADS 180 MG PO CP24
300.0000 mg | ORAL_CAPSULE | Freq: Every day | ORAL | Status: DC
  Administered 2016-02-16 – 2016-02-26 (×11): 300 mg via ORAL
  Filled 2016-02-16 (×13): qty 1

## 2016-02-16 MED ORDER — RAMELTEON 8 MG PO TABS
8.0000 mg | ORAL_TABLET | Freq: Every day | ORAL | Status: DC
Start: 2016-02-16 — End: 2016-02-18
  Administered 2016-02-16 – 2016-02-17 (×2): 8 mg via ORAL
  Filled 2016-02-16 (×3): qty 1

## 2016-02-16 MED ORDER — ONDANSETRON HCL 4 MG PO TABS
4.0000 mg | ORAL_TABLET | Freq: Four times a day (QID) | ORAL | Status: DC | PRN
  Administered 2016-02-16: 4 mg via ORAL
  Filled 2016-02-16: qty 1

## 2016-02-16 MED ORDER — PREDNISONE 20 MG PO TABS: ORAL_TABLET | ORAL | Status: DC

## 2016-02-16 MED ORDER — HEPARIN SODIUM (PORCINE) 5000 UNIT/ML IJ SOLN
5000.0000 [IU] | Freq: Three times a day (TID) | INTRAMUSCULAR | Status: DC
  Administered 2016-02-16 – 2016-02-17 (×2): 5000 [IU] via SUBCUTANEOUS
  Filled 2016-02-16 (×2): qty 1

## 2016-02-16 MED ORDER — IPRATROPIUM-ALBUTEROL 0.5-2.5 (3) MG/3ML IN SOLN
3.0000 mL | RESPIRATORY_TRACT | Status: DC | PRN
Start: 2016-02-16 — End: 2016-02-22
  Administered 2016-02-16 – 2016-02-22 (×19): 3 mL via RESPIRATORY_TRACT
  Filled 2016-02-16 (×19): qty 3

## 2016-02-16 MED ORDER — SODIUM CHLORIDE 0.9% FLUSH
3.0000 mL | Freq: Two times a day (BID) | INTRAVENOUS | Status: DC
  Administered 2016-02-16 – 2016-02-25 (×16): 3 mL via INTRAVENOUS

## 2016-02-16 MED ORDER — METHYLPREDNISOLONE SODIUM SUCC 125 MG IJ SOLR
60.0000 mg | Freq: Four times a day (QID) | INTRAMUSCULAR | Status: DC
  Administered 2016-02-16 – 2016-02-17 (×2): 60 mg via INTRAVENOUS
  Filled 2016-02-16 (×2): qty 2

## 2016-02-16 MED ORDER — FUROSEMIDE 40 MG PO TABS
40.0000 mg | ORAL_TABLET | Freq: Every day | ORAL | Status: DC
  Administered 2016-02-16: 40 mg via ORAL
  Filled 2016-02-16 (×2): qty 1

## 2016-02-16 MED ORDER — ALUM & MAG HYDROXIDE-SIMETH 200-200-20 MG/5ML PO SUSP
30.0000 mL | Freq: Once | ORAL | Status: AC
  Administered 2016-02-16: 30 mL via ORAL

## 2016-02-16 MED ORDER — ACETAMINOPHEN 325 MG PO TABS
650.0000 mg | ORAL_TABLET | Freq: Four times a day (QID) | ORAL | Status: DC | PRN
  Administered 2016-02-17 – 2016-02-25 (×8): 650 mg via ORAL
  Filled 2016-02-16 (×8): qty 2

## 2016-02-16 MED ORDER — ARFORMOTEROL TARTRATE 15 MCG/2ML IN NEBU
15.0000 ug | INHALATION_SOLUTION | Freq: Two times a day (BID) | RESPIRATORY_TRACT | Status: DC
  Administered 2016-02-16 – 2016-02-26 (×20): 15 ug via RESPIRATORY_TRACT
  Filled 2016-02-16 (×22): qty 2

## 2016-02-16 MED ORDER — ALBUTEROL SULFATE (2.5 MG/3ML) 0.083% IN NEBU
INHALATION_SOLUTION | RESPIRATORY_TRACT | Status: AC
  Filled 2016-02-16: qty 3

## 2016-02-16 NOTE — ED Notes (Signed)
Ems from home for resp distress. Seen at MD today who prescribed prednisone and inhaler. Pt feeling worse on return to home and called ems. Pt on 2LNC at night.

## 2016-02-16 NOTE — ED Provider Notes (Signed)
[facility] Emergency Department Provider Note  ____________________________________________  Time seen: 4:30 PM  I have reviewed the triage vital signs and the nursing notes.   HISTORY  Chief Complaint Respiratory Distress    HPI Toni [name] is a 81 y.o. female brought to the ED via EMS for difficulty breathing. Patient has been having shortness of breath for the last 2 days. She went to her primary care doctor today who prescribed prednisone and an inhaler. Despite taking a few puffs of the inhaler and her present dose for today, she was feeling worse at home. EMS came to the house and found her room air saturation to be in the low 80s. They put the patient on 4 L nasal cannula and oxygen saturation improved to 88%. They applied a nonrebreather to increased oxygen saturation to the mid 90s. EMS gave to do her nebs en route.  Patient denies chest pain. Just shortness of breath. No exertional symptoms. No orthopnea. No swelling in the lower extremities. Has a nonproductive cough. Denies fevers chills or sweats.  She uses 2 L nasal cannula at night.   Past Medical History  Diagnosis Date  . Cataract   . COPD (chronic obstructive pulmonary disease) ([place])   . Essential hypertension, benign   . Hypothyroidism   . Atypical atrial flutter ([employer]) 11/16/2013    a. CHA2DS2VASc = 5-->prev on xarelto, d/c'd due to bleeding skin tear;  c. 11/2013->Placed on flecainide.  . Chronic diastolic heart failure ([place])   . PAF (paroxysmal atrial fibrillation) ([place])     a. 11/2013 Echo: EF 55-60%, mild AS/MR, mod dil LA/RA, PASP 10mHg;  b. CHA2DS2VASc = 5-->prev on xarelto, d/c'd due to bleeding skin tear;  c. 11/2013->Placed on flecainide.  . Lung nodule     Followed by Dr. FRaul Del . 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 Lung cancer (HPot[place]   . Collagen vascular disease (HCamp[place]   . Hypokalemia   . Chronic diastolic CHF (congestive heart failure) ([place]Spring [place]      a. 11/2013 Echo: EF 55-60%, mild AS/MR, mod dil LA/RA, PASP 344mg.     Patient Active Problem List   Diagnosis Date Noted  . Encounter for anticoagulation discussion and counseling 11/17/2015  . Fall 11/17/2015  . Essential hypertension, benign   . Lung infiltrate on CT 06/08/2015  . Chronic diastolic CHF (congestive heart failure) (HCMontclair08/29/2016  . Insomnia 04/17/2014  . Squamous cell lung cancer (HCOlympia Heights07/06/2014  . Postnasal drip 03/05/2014  . Hyperlipidemia 12/04/2013  . Leg edema 12/04/2013  . Atypical atrial flutter (HCDepauville03/09/2013  . COPD (chronic obstructive pulmonary disease) (HCUnion02/28/2015  . Chronic kidney disease 08/24/2013  . Essential (primary) hypertension 08/24/2013  . Adult hypothyroidism 08/24/2013  . Acute exacerbation of chronic obstructive airways disease (HCRound [place]12/03/2013  . Chronic obstructive pulmonary disease with acute exacerbation (HCPark City12/03/2013     Past Surgical History  Procedure Laterality Date  . Eye surgery    . Total knee arthroplasty Bilateral      Current Outpatient Rx  Name  Route  Sig  Dispense  Refill  . albuterol (PROVENTIL HFA;VENTOLIN HFA) 108 (90 Base) MCG/ACT inhaler   Inhalation   Inhale 2 puffs into the lungs every 4 (four) hours as needed for wheezing or shortness of breath.   1 Inhaler   12   . budesonide (PULMICORT) 0.5 MG/2ML nebulizer solution   Nebulization   Take 2 mLs (0.5 mg total) by nebulization  2 (two) times daily.   120 mL   11   . diltiazem (CARDIZEM CD) 300 MG 24 hr capsule   Oral   Take 300 mg by mouth daily.         . flecainide (TAMBOCOR) 100 MG tablet   Oral   Take 1 tablet (100 mg total) by mouth 2 (two) times daily.   60 tablet   11   . formoterol (PERFOROMIST) 20 MCG/2ML nebulizer solution   Nebulization   Take 2 mLs (20 mcg total) by nebulization 2 (two) times daily.   2 mL   12   . furosemide (LASIX) 40 MG tablet   Oral   Take 40 mg by mouth daily.         Marland Kitchen  ipratropium-albuterol (DUONEB) 0.5-2.5 (3) MG/3ML SOLN   Nebulization   Take 3 mLs by nebulization every 4 (four) hours as needed (for wheezing/shortness of breath).         Marland Kitchen levofloxacin (LEVAQUIN) 250 MG tablet   Oral   Take 1 tablet (250 mg total) by mouth daily.   7 tablet   0   . levothyroxine (SYNTHROID, LEVOTHROID) 75 MCG tablet   Oral   Take 75 mcg by mouth daily before breakfast.         . Melatonin 3 MG TABS   Oral   Take 3 mg by mouth at bedtime as needed (for sleep).         . naproxen (NAPROSYN) 500 MG tablet   Oral   Take 500 mg by mouth 2 (two) times daily as needed for mild pain.         Marland Kitchen omega-3 acid ethyl esters (LOVAZA) 1 g capsule   Oral   Take 1 g by mouth daily.         Marland Kitchen omeprazole (PRILOSEC) 20 MG capsule   Oral   Take 20 mg by mouth 2 (two) times daily before a meal.         . pantoprazole (PROTONIX) 40 MG tablet   Oral   Take 1 tablet (40 mg total) by mouth daily.   30 tablet   1   . potassium chloride (K-DUR,KLOR-CON) 10 MEQ tablet   Oral   Take 20 mEq by mouth daily.         . predniSONE (DELTASONE) 20 MG tablet   Oral   Take 20-60 mg by mouth daily with breakfast. Pt is to take as a taper:  3 tablets for three days, 2 tablets for three days, one tablet for three days, then STOP.            Allergies Belladonna alkaloids   Family History  Problem Relation Age of Onset  . CAD Father   . Heart disease Father   . Heart attack Father   . Arrhythmia Sister     Social History Social History  Substance Use Topics  . Smoking status: Former Smoker -- 1.50 packs/day for 50 years    Types: Cigarettes    Quit date: 09/18/2002  . Smokeless tobacco: Never Used  . Alcohol Use: No    Review of Systems  Constitutional:   No fever or chills.  Eyes:   No vision changes.  ENT:   No sore throat. No rhinorrhea. Cardiovascular:   No chest pain. Respiratory:   Positive shortness of breath and nonproductive  cough. Gastrointestinal:   Negative for abdominal pain, vomiting and diarrhea.  Genitourinary:   Negative for dysuria or  difficulty urinating. Musculoskeletal:   Negative for focal pain or swelling Neurological:   Negative for headaches 10-point ROS otherwise negative.  ____________________________________________   PHYSICAL EXAM:  VITAL SIGNS: ED Triage Vitals  Enc Vitals Group     BP 02/16/16 1631 140/60 mmHg     Pulse Rate 02/16/16 1631 57     Resp --      Temp 02/16/16 1631 98.3 F (36.8 C)     Temp Source 02/16/16 1631 Oral     SpO2 02/16/16 1631 100 %     Weight 02/16/16 1631 187 lb (84.823 kg)     Height 02/16/16 1631 5' 2"  (1.575 m)     Head Cir --      Peak Flow --      Pain Score --      Pain Loc --      Pain Edu? --      Excl. in [place]? --     Vital signs reviewed, nursing assessments reviewed.   Constitutional:   Alert and oriented. Well appearing and in no distress. Eyes:   No scleral icterus. No conjunctival pallor. PERRL. EOMI.  No nystagmus. ENT   Head:   Normocephalic and atraumatic.   Nose:   No congestion/rhinnorhea. No septal hematoma   Mouth/Throat:   MMM, no pharyngeal erythema. No peritonsillar mass.    Neck:   No stridor. No SubQ emphysema. No meningismus. Hematological/Lymphatic/Immunilogical:   No cervical lymphadenopathy. Cardiovascular:   RRR. Symmetric bilateral radial and DP pulses.  No murmurs.  Respiratory:   Normal respiratory effort without tachypnea nor retractions.Symmetric, diminished breath sounds in all lung fields. Slightly prolonged expiratory phase. Diffuse expiratory wheezing. Gastrointestinal:   Soft and nontender. Non distended. There is no CVA tenderness.  No rebound, rigidity, or guarding. Genitourinary:   deferred Musculoskeletal:   Nontender with normal range of motion in all extremities. No joint effusions.  No lower extremity tenderness.  No edema. Neurologic:   Normal speech and language.  CN 2-10  normal. Motor grossly intact. No gross focal neurologic deficits are appreciated.  Skin:    Skin is warm, dry and intact. No rash noted.  No petechiae, purpura, or bullae.  ____________________________________________    LABS (pertinent positives/negatives) (all labs ordered are listed, but only abnormal results are displayed) Labs Reviewed  BASIC METABOLIC PANEL - Abnormal; Notable for the following:    Glucose, Bld 131 (*)    BUN 31 (*)    Creatinine, Ser 1.22 (*)    GFR calc non Af Amer 41 (*)    GFR calc Af Amer 48 (*)    All other components within normal limits  CBC WITH DIFFERENTIAL/PLATELET - Abnormal; Notable for the following:    RDW 16.6 (*)    Lymphs Abs 0.8 (*)    All other components within normal limits  TROPONIN I   ____________________________________________   EKG  Interpreted by me Atrial fibrillation rate of 58. Normal axis and intervals. Poor R-wave progression in anterior precordial leads. Normal ST segments. Isolated T-wave inversion in lead 3 which is nonspecific  ____________________________________________    RADIOLOGY  CT chest without contrast unremarkable Rest x-ray unremarkable  ____________________________________________   PROCEDURES   ____________________________________________   INITIAL IMPRESSION / ASSESSMENT AND PLAN / ED COURSE  Pertinent labs & imaging results that were available during my care of the patient were reviewed by me and considered in my medical decision making (see chart for details).  Patient presents with shortness of breath and room  air hypoxia, likely COPD exacerbation. Baseline oxygen requirement is 2 L at night, none during the daytime. She appears to be increased from this at this time. She does also appear to be improving. We'll continue to monitor, check labs and chest x-ray, give additional albuterol nebulized treatment, and reassess. Low suspicion for ACS PE dissection or  pneumothorax.  ----------------------------------------- 8:06 PM on 02/16/2016 -----------------------------------------  Patient still short of breath, persistent wheezing and prolonged expiratory phase. Severely dyspneic with any ambulation in the treatment room. Still with increased oxygen requirement from baseline. Case discussed with the hospitalist for admission. Low suspicion of ACS PE TAD. No evidence of sepsis.     ____________________________________________   FINAL CLINICAL IMPRESSION(S) / ED DIAGNOSES  Final diagnoses:  COPD with exacerbation ([place])  Acute on chronic respiratory failure with hypoxia     Portions of this note were generated with dragon dictation software. Dictation errors may occur despite best attempts at proofreading.   Carrie Mew, MD 02/16/16 2007

## 2016-02-16 NOTE — Telephone Encounter (Signed)
Pt husband calling stating she is a COPD patient and is having a real problem with breathing.  This is going on for couple days.  Pt got up about 2:30 am, can't lay down  Pt husband is really concerned about patient.  Please advise.

## 2016-02-16 NOTE — Patient Instructions (Signed)
Prednisone as follows: 60 mg (3 pills) daily for three days, Then 40 mg (2 pills) daily for three days Then 20 mg (1 pill) daily for three days Then stop  Levofloxacin 250 mg - one pill daily for 7 days  We will call you in 2 days to make sure you are getting better  Follow up Friday 06/09 with Dr Mortimer Fries and with CXR that day

## 2016-02-16 NOTE — Progress Notes (Signed)
ACUTE PULMONARY OFFICE VISIT  ACUTE PROBLEM: Acute increase in dyspnea, chest tightness, nonproductive cough  CHRONIC PULMONARY PROBLEMS: COPD, oxygen dependent - Pt of Dr Mortimer Fries  SUBJ: 1-2 days of increased dyspnea to point of mild dyspnea @ rest with speech. Also reports chest tightness, nonproductive cough and increased LE edema. Denies fever, purulent sputum, hemoptysis, pleurodynia and anginal chest pain.   OBJ: Filed Vitals:   02/16/16 1013  BP: 130/78  Pulse: 71  Height: '5\' 3"'$  (1.6 m)  Weight: 183 lb (83.008 kg)  SpO2: 99%  2 LPM [place]  Very HOH Appears mildly dyspneic @ rest  HEENT without acute findings NO JVD BS moderately diminished with scattered distant wheezes HS distant, regular, soft systolic M Obese, soft, NT, +BS Symmetric 1+ ankle and pedal edema No focal neuro deficits    IMPRESSION: 1) COPD, oxygen dependent 2) Acute exacerbation of COPD - she appears somewhat tenuous but expressed a desire to remain out of hospital 3) Knowledge deficit re: her COPD medications - she uses nebulized budesonide and formoterol BID but also asked for Symbicort MDI which she has been using as her rescue medication. Although her medication list indicates that she has albuterol ordered as a rescue MDI, she does not have this inhaler and seems unaware that this is to be her rescue medication. It took much effort (in part due to hearing deficit) to clarify that there is no reason to have a Symbicort inhaler if she is on the same medications in nebulized form and that albuterol is to be her rescue MDI   PLAN:   Prednisone taper - 60 mg daily X 3, 40 mg daily X 3, 20 mg daily X 3, stop Levofloxacin 250 mg daily X 7 We will call her on Fri 02/18/16 to ensure that she is improving Follow up with Dr Mortimer Fries 02/25/16 with CXR same day I spent 15 minutes clarifying her COPD regimen as above We could consider the addition of a LAMA in future if control remains poor after current exacerbation  resolves    I have reviewed this patient's medical problems, current medications and therapies and prior pulmonary office notes in evaluation and formulation of the above assessment and plan  Merton Border, MD PCCM service Mobile [phone] Pager [phone] 02/16/2016

## 2016-02-16 NOTE — H&P (Signed)
[place]  at [place] NAME: Toni [name]    MR#:  [id number]  DATE OF BIRTH:  [birth year]  DATE OF ADMISSION:  02/16/2016  PRIMARY CARE PHYSICIAN: Pcp Not In System   REQUESTING/REFERRING PHYSICIAN: Joni Fears, MD  CHIEF COMPLAINT:   Chief Complaint  Patient presents with  . Respiratory Distress    HISTORY OF PRESENT ILLNESS:  Toni [name]  is a 81 y.o. female who presents with Progressive shortness of breath. Patient states that her breathing has been getting progressively worse over the past 2-3 days. She denies any fever or chills, significant increase in sputum production, states that she has had somewhat of a cough as well as some wheezing. She states that her home treatments were progressively less effective as well. She went and saw her pulmonologist this morning and was put on by mouth prednisone as well as by mouth Levaquin, but states that throughout the day her breathing discussing if worse. She came to the ED for evaluation and was found to be initially hypoxic, which corrected well with supple oxygen, she is on 4 L of O2 nasal cannula the time of this interview with good O2 saturation. Hospitalists were called for admission.  PAST MEDICAL HISTORY:   Past Medical History  Diagnosis Date  . Cataract   . COPD (chronic obstructive pulmonary disease) ([place])   . Essential hypertension, benign   . Hypothyroidism   . Atypical atrial flutter ([place]) 11/16/2013    a. CHA2DS2VASc = 5-->prev on xarelto, d/c'd due to bleeding skin tear;  c. 11/2013->Placed on flecainide.  . Chronic diastolic heart failure ([place])   . PAF (paroxysmal atrial fibrillation) ([place])     a. 11/2013 Echo: EF 55-60%, mild AS/MR, mod dil LA/RA, PASP 60mHg;  b. CHA2DS2VASc = 5-->prev on xarelto, d/c'd due to bleeding skin tear;  c. 11/2013->Placed on flecainide.  . Lung nodule     Followed by Dr. FRaul Del . 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  Lung cancer ([facility]   . Collagen vascular disease ([facility]   . Hypokalemia   . Chronic diastolic CHF (congestive heart failure) ([facility]     a. 11/2013 Echo: EF 55-60%, mild AS/MR, mod dil LA/RA, PASP 330mg.    PAST SURGICAL HISTORY:   Past Surgical History  Procedure Laterality Date  . Eye surgery    . Total knee arthroplasty Bilateral     SOCIAL HISTORY:   Social History  Substance Use Topics  . Smoking status: Former Smoker -- 1.50 packs/day for 50 years    Types: Cigarettes    Quit date: 09/18/2002  . Smokeless tobacco: Never Used  . Alcohol Use: No    FAMILY HISTORY:   Family History  Problem Relation Age of Onset  . CAD Father   . Heart disease Father   . Heart attack Father   . Arrhythmia Sister     DRUG ALLERGIES:   Allergies  Allergen Reactions  . Belladonna Alkaloids Anaphylaxis and Other (See Comments)    Pt states that this medication makes her eyes cross.      MEDICATIONS AT HOME:   Prior to Admission medications   Medication Sig Start Date End Date Taking? Authorizing Provider  albuterol (PROVENTIL HFA;VENTOLIN HFA) 108 (90 Base) MCG/ACT inhaler Inhale 2 puffs into the lungs every 4 (four) hours as needed for wheezing or shortness of breath. 02/16/16  Yes DaWilhelmina McardleMD  budesonide (PULMICORT) 0.5 MG/2ML nebulizer  solution Take 2 mLs (0.5 mg total) by nebulization 2 (two) times daily. 06/08/15  Yes Flora Lipps, MD  diltiazem (CARDIZEM CD) 300 MG 24 hr capsule Take 300 mg by mouth daily.   Yes Historical Provider, MD  flecainide (TAMBOCOR) 100 MG tablet Take 1 tablet (100 mg total) by mouth 2 (two) times daily. 11/24/13  Yes Frazier Richards, MD  formoterol (PERFOROMIST) 20 MCG/2ML nebulizer solution Take 2 mLs (20 mcg total) by nebulization 2 (two) times daily. 06/08/15  Yes Flora Lipps, MD  furosemide (LASIX) 40 MG tablet Take 40 mg by mouth daily.   Yes Historical Provider, MD  ipratropium-albuterol (DUONEB) 0.5-2.5 (3) MG/3ML SOLN Take 3 mLs by nebulization  every 4 (four) hours as needed (for wheezing/shortness of breath).   Yes Historical Provider, MD  levofloxacin (LEVAQUIN) 250 MG tablet Take 1 tablet (250 mg total) by mouth daily. 02/16/16  Yes Wilhelmina Mcardle, MD  levothyroxine (SYNTHROID, LEVOTHROID) 75 MCG tablet Take 75 mcg by mouth daily before breakfast.   Yes Historical Provider, MD  Melatonin 3 MG TABS Take 3 mg by mouth at bedtime as needed (for sleep).   Yes Historical Provider, MD  naproxen (NAPROSYN) 500 MG tablet Take 500 mg by mouth 2 (two) times daily as needed for mild pain.   Yes Historical Provider, MD  omega-3 acid ethyl esters (LOVAZA) 1 g capsule Take 1 g by mouth daily.   Yes Historical Provider, MD  omeprazole (PRILOSEC) 20 MG capsule Take 20 mg by mouth 2 (two) times daily before a meal.   Yes Historical Provider, MD  pantoprazole (PROTONIX) 40 MG tablet Take 1 tablet (40 mg total) by mouth daily. 12/21/15 12/20/16 Yes Earleen Newport, MD  potassium chloride (K-DUR,KLOR-CON) 10 MEQ tablet Take 20 mEq by mouth daily.   Yes Historical Provider, MD  predniSONE (DELTASONE) 20 MG tablet Take 20-60 mg by mouth daily with breakfast. Pt is to take as a taper:  3 tablets for three days, 2 tablets for three days, one tablet for three days, then STOP. 02/16/16 02/25/16 Yes Historical Provider, MD    REVIEW OF SYSTEMS:  Review of Systems  Constitutional: Negative for fever, chills, weight loss and malaise/fatigue.  HENT: Negative for ear pain, hearing loss and tinnitus.   Eyes: Negative for blurred vision, double vision, pain and redness.  Respiratory: Positive for cough, shortness of breath and wheezing. Negative for hemoptysis.   Cardiovascular: Negative for chest pain, palpitations, orthopnea and leg swelling.  Gastrointestinal: Negative for nausea, vomiting, abdominal pain, diarrhea and constipation.  Genitourinary: Negative for dysuria, frequency and hematuria.  Musculoskeletal: Negative for back pain, joint pain and neck pain.   Skin:       No acne, rash, or lesions  Neurological: Negative for dizziness, tremors, focal weakness and weakness.  Endo/Heme/Allergies: Negative for polydipsia. Does not bruise/bleed easily.  Psychiatric/Behavioral: Negative for depression. The patient is not nervous/anxious and does not have insomnia.      VITAL SIGNS:   Filed Vitals:   02/16/16 1631 02/16/16 1915 02/16/16 1930 02/16/16 2003  BP: 140/60 125/53 112/68 133/67  Pulse: 57 70 71 69  Temp: 98.3 F (36.8 C)     TempSrc: Oral     Resp:  _0 Height: 5' 2" (1.575 m)     Weight: 84.823 kg (187 lb)     SpO2: 100% 97% 100% 100%   Wt Readings from Last 3 Encounters:  02/16/16 84.823 kg (187 lb)  02/16/16 83.008 kg (  183 lb)  01/25/16 80.287 kg (177 lb)    PHYSICAL EXAMINATION:  Physical Exam  Vitals reviewed. Constitutional: She is oriented to person, place, and time. She appears well-developed and well-nourished. No distress.  HENT:  Head: Normocephalic and atraumatic.  Mouth/Throat: Oropharynx is clear and moist.  Eyes: Conjunctivae and EOM are normal. Pupils are equal, round, and reactive to light. No scleral icterus.  Neck: Normal range of motion. Neck supple. No JVD present. No thyromegaly present.  Cardiovascular: Normal rate and intact distal pulses.  Exam reveals no gallop and no friction rub.   No murmur heard. Irregular rhythm  Respiratory: She is in respiratory distress (mild). She has wheezes. She has no rales.  GI: Soft. Bowel sounds are normal. She exhibits no distension. There is no tenderness.  Musculoskeletal: Normal range of motion. She exhibits no edema.  No arthritis, no gout  Lymphadenopathy:    She has no cervical adenopathy.  Neurological: She is alert and oriented to person, place, and time. No cranial nerve deficit.  No dysarthria, no aphasia  Skin: Skin is warm and dry. No rash noted. No erythema.  Psychiatric: She has a normal mood and affect. Her behavior is normal. Judgment and  thought content normal.    LABORATORY PANEL:   CBC  Recent Labs Lab 02/16/16 1647  WBC 7.4  HGB 12.0  HCT 36.8  PLT 228   ------------------------------------------------------------------------------------------------------------------  Chemistries   Recent Labs Lab 02/16/16 1647  NA 141  K 3.8  CL 101  CO2 29  GLUCOSE 131*  BUN 31*  CREATININE 1.22*  CALCIUM 9.4   ------------------------------------------------------------------------------------------------------------------  Cardiac Enzymes  Recent Labs Lab 02/16/16 1647  TROPONINI 0.03   ------------------------------------------------------------------------------------------------------------------  RADIOLOGY:  Ct Chest Wo Contrast  02/16/2016  CLINICAL DATA:  Dyspnea and cough. History of lung cancer and radiation treatment. EXAM: CT CHEST WITHOUT CONTRAST TECHNIQUE: Multidetector CT imaging of the chest was performed following the standard protocol without IV contrast. COMPARISON:  Chest CT 10/06/2015 and chest radiograph 02/16/2016 FINDINGS: Mediastinum/Lymph Nodes: Again noted is a moderate to large sized hiatal hernia. Prominent mitral annular calcifications. There are some coronary artery calcifications. No significant pericardial fluid. No suspicious chest lymphadenopathy. Lungs/Pleura: Trachea and mainstem bronchi are patent. Scarring along the posterior aspect of the right upper lobe near the right major fissure. Again noted is an irregular nodular lesion in the right upper lobe with adjacent scarring. This nodular structure measures roughly 0.9 x 0.6 cm on sequence 3, image 56 and minimally changed from the previous examination. Again noted is septal thickening and linear densities in the right lower lung probably related to the scarring. There is some volume loss at the base of the lingula which is new. Scarring along the central aspect of the left lower lobe. Stable oval-shaped nodule in the superior  segment of the left lower lobe measures 0.9 x 0.6 cm and stable. No new pulmonary nodules. Upper abdomen: Layering gallstones. No acute abnormality in the upper abdomen. Musculoskeletal: No acute bone abnormality. Again noted is multilevel disc space disease in lower thoracic spine and upper lumbar spine. IMPRESSION: No acute chest abnormality. Stable pulmonary nodules and post treatment changes in the right lung. Hiatal hernia. Gallstones. Electronically Signed   By: Markus Daft M.D.   On: 02/16/2016 18:53   Dg Chest Portable 1 View  02/16/2016  CLINICAL DATA:  Difficulty breathing EXAM: PORTABLE CHEST 1 VIEW COMPARISON:  01/10/2016 FINDINGS: Cardiac shadow is enlarged but stable. Small hiatal hernia is noted. The  lungs are well aerated bilaterally. Mild interstitial changes are seen. No focal infiltrate is noted. Somewhat ovoid density is noted projecting over the right heart border. This likely represents pulmonary vascularity although follow-up examination would be helpful. IMPRESSION: Somewhat ovoid density overlying the right heart border of uncertain significance. This may simply represent prominent pulmonary vasculature. CT would be helpful for further evaluation. Electronically Signed   By: Inez Catalina M.D.   On: 02/16/2016 17:18    EKG:   Orders placed or performed during the hospital encounter of 02/16/16  . EKG 12-Lead  . EKG 12-Lead    IMPRESSION AND PLAN:  Principal Problem:   Chronic obstructive pulmonary disease with acute exacerbation (HCC) - IV steroids, nebs, azithromycin. Active Problems:   Atypical atrial flutter (HCC) - chronic flutter/A. fib. Continue home meds for rate control.   Chronic diastolic CHF (congestive heart failure) (HCC) - not currently in exacerbation. Continue home meds.   Essential hypertension, benign - currently stable and at goal, continue home meds   Adult hypothyroidism - continue home dose thyroid replacement  All the records are reviewed and case  discussed with ED provider. Management plans discussed with the patient and/or family.  DVT PROPHYLAXIS: SubQ heparin  GI PROPHYLAXIS: PPI  ADMISSION STATUS: Inpatient  CODE STATUS: Full Code Status History    Date Active Date Inactive Code Status Order ID Comments User Context   11/16/2013 12:33 AM 11/24/2013  6:53 PM Full Code [id number]  Angelica Ran, MD Inpatient      TOTAL TIME TAKING CARE OF THIS PATIENT: 45 minutes.    Rylyn Ranganathan Gem 02/16/2016, 8:14 PM  [facility]  Office  [phone]  CC: Primary care physician; Pcp Not In System

## 2016-02-16 NOTE — Telephone Encounter (Signed)
Have spoken with pt's husband and pt is scheduled to come in this am at 10:15am. Nothing further needed.

## 2016-02-16 NOTE — ED Notes (Signed)
Pt requesting something for ingestion.

## 2016-02-17 LAB — CBC
HEMATOCRIT: 31.9 % — AB (ref 35.0–47.0)
HEMOGLOBIN: 10.4 g/dL — AB (ref 12.0–16.0)
MCH: 28.8 pg (ref 26.0–34.0)
MCHC: 32.5 g/dL (ref 32.0–36.0)
MCV: 88.6 fL (ref 80.0–100.0)
Platelets: 208 10*3/uL (ref 150–440)
RBC: 3.61 MIL/uL — ABNORMAL LOW (ref 3.80–5.20)
RDW: 16.6 % — ABNORMAL HIGH (ref 11.5–14.5)
WBC: 4.7 10*3/uL (ref 3.6–11.0)

## 2016-02-17 LAB — BASIC METABOLIC PANEL
ANION GAP: 8 (ref 5–15)
BUN: 36 mg/dL — ABNORMAL HIGH (ref 6–20)
CO2: 28 mmol/L (ref 22–32)
Calcium: 8.9 mg/dL (ref 8.9–10.3)
Chloride: 104 mmol/L (ref 101–111)
Creatinine, Ser: 1.44 mg/dL — ABNORMAL HIGH (ref 0.44–1.00)
GFR calc Af Amer: 39 mL/min — ABNORMAL LOW (ref >60)
GFR, EST NON AFRICAN AMERICAN: 34 mL/min — AB (ref >60)
Glucose, Bld: 168 mg/dL — ABNORMAL HIGH (ref 65–99)
POTASSIUM: 4.1 mmol/L (ref 3.5–5.1)
SODIUM: 140 mmol/L (ref 135–145)

## 2016-02-17 MED ORDER — FUROSEMIDE 40 MG PO TABS
40.0000 mg | ORAL_TABLET | Freq: Two times a day (BID) | ORAL | Status: DC
  Administered 2016-02-17 – 2016-02-18 (×4): 40 mg via ORAL
  Filled 2016-02-17 (×3): qty 1

## 2016-02-17 MED ORDER — LORAZEPAM 2 MG/ML IJ SOLN
INTRAMUSCULAR | Status: AC
Start: 2016-02-17 — End: 2016-02-18
  Filled 2016-02-17: qty 1

## 2016-02-17 MED ORDER — LORAZEPAM 2 MG/ML IJ SOLN
0.5000 mg | Freq: Once | INTRAMUSCULAR | Status: AC
  Administered 2016-02-17: 23:00:00 0.5 mg via INTRAVENOUS

## 2016-02-17 MED ORDER — ALUM & MAG HYDROXIDE-SIMETH 200-200-20 MG/5ML PO SUSP
15.0000 mL | Freq: Four times a day (QID) | ORAL | Status: DC | PRN
  Administered 2016-02-17 – 2016-02-21 (×7): 15 mL via ORAL
  Filled 2016-02-17 (×7): qty 30

## 2016-02-17 MED ORDER — ENOXAPARIN SODIUM 40 MG/0.4ML ~~LOC~~ SOLN
40.0000 mg | SUBCUTANEOUS | Status: DC
  Administered 2016-02-17 – 2016-02-18 (×2): 40 mg via SUBCUTANEOUS
  Filled 2016-02-17 (×2): qty 0.4

## 2016-02-17 MED ORDER — METHYLPREDNISOLONE SODIUM SUCC 125 MG IJ SOLR
60.0000 mg | Freq: Two times a day (BID) | INTRAMUSCULAR | Status: DC
  Administered 2016-02-17 – 2016-02-18 (×3): 60 mg via INTRAVENOUS
  Filled 2016-02-17 (×3): qty 2

## 2016-02-17 MED ORDER — LORAZEPAM 1 MG PO TABS
1.0000 mg | ORAL_TABLET | Freq: Once | ORAL | Status: AC
  Administered 2016-02-17: 01:00:00 1 mg via ORAL
  Filled 2016-02-17: qty 1

## 2016-02-17 NOTE — Care Management (Signed)
Admitted to [facility] with the diagnosis of COPD. Lives with husband, Darnell Level [phone]).  Home oxygen at night only 2 liters per nasal cannula. Oxygen provided by [facility] 1-2 years. Last seen Dr. Inez Catalina" at [facility] in [place] 1-2 months ago. Cane and rolling available in the home, if needed. No falls., Good appetite. No home health. No skilled facility. Gets prescriptions filled at [facility] in [facility]. Works in [employer]. Husband will transport. Shelbie Ammons RN MSN CCM Care Management [phone]

## 2016-02-17 NOTE — Care Management Important Message (Signed)
Important Message  Patient Details  Name: Toni [name] MRN: [id number] Date of Birth: 26-Jul-1935   Medicare Important Message Given:  Yes    Shelbie Ammons, RN 02/17/2016, 8:44 AM

## 2016-02-17 NOTE — Plan of Care (Signed)
Problem: Safety: Goal: Ability to remain free from injury will improve Outcome: Progressing Chair  x2 today for long periods tol well

## 2016-02-17 NOTE — Plan of Care (Deleted)
Problem: Health Behavior/Discharge Planning: Goal: Ability to manage health-related needs will improve Outcome: Progressing Per family

## 2016-02-17 NOTE — Progress Notes (Signed)
[place] at [place] NAME: Toni [name]    MR#:  [id number]  DATE OF BIRTH:  [birth year]  SUBJECTIVE:  CHIEF COMPLAINT:   Chief Complaint  Patient presents with  . Respiratory Distress   Has some improvement since admission but continues to have shortness of breath, wheezing and cough. Lower extremity edema. No orthopnea.  REVIEW OF SYSTEMS:    Review of Systems  Constitutional: Positive for malaise/fatigue.  Respiratory: Positive for cough, sputum production, shortness of breath and wheezing. Negative for hemoptysis.   Cardiovascular: Positive for leg swelling. Negative for chest pain and orthopnea.  Gastrointestinal: Negative for nausea, vomiting, abdominal pain and diarrhea.  Genitourinary: Negative for dysuria.  Skin: Negative for rash.  Neurological: Positive for weakness. Negative for dizziness and focal weakness.    DRUG ALLERGIES:   Allergies  Allergen Reactions  . Belladonna Alkaloids Anaphylaxis and Other (See Comments)    Pt states that this medication makes her eyes cross.      VITALS:  Blood pressure 115/52, pulse 62, temperature 98 F (36.7 C), temperature source Oral, resp. rate 20, height '5\' 2"'$  (1.575 m), weight 84.46 kg (186 lb 3.2 oz), SpO2 99 %.  PHYSICAL EXAMINATION:   Physical Exam  GENERAL:  81 y.o.-year-old patient lying in the bed with Conversational dyspnea EYES: Pupils equal, round, reactive to light and accommodation. No scleral icterus. Extraocular muscles intact.  HEENT: Head atraumatic, normocephalic. Oropharynx and nasopharynx clear.  NECK:  Supple, no jugular venous distention. No thyroid enlargement, no tenderness.  LUNGS:  Bilateral wheezing with decreased air entry. Increase work of breathing CARDIOVASCULAR: S1, S2 normal. No murmurs, rubs, or gallops.  ABDOMEN: Soft, nontender, nondistended. Bowel sounds present. No organomegaly or mass.  EXTREMITIES: No cyanosis, clubbing. 1+ lower  extremity edema    NEUROLOGIC: Cranial nerves II through XII are intact. No focal Motor or sensory deficits b/l.   PSYCHIATRIC: The patient is alert and oriented x 3.  SKIN: No obvious rash, lesion, or ulcer.   LABORATORY PANEL:   CBC  Recent Labs Lab 02/17/16 0613  WBC 4.7  HGB 10.4*  HCT 31.9*  PLT 208   ------------------------------------------------------------------------------------------------------------------ Chemistries   Recent Labs Lab 02/17/16 0613  NA 140  K 4.1  CL 104  CO2 28  GLUCOSE 168*  BUN 36*  CREATININE 1.44*  CALCIUM 8.9   ------------------------------------------------------------------------------------------------------------------  Cardiac Enzymes  Recent Labs Lab 02/16/16 1647  TROPONINI 0.03   ------------------------------------------------------------------------------------------------------------------  RADIOLOGY:  Ct Chest Wo Contrast  02/16/2016  CLINICAL DATA:  Dyspnea and cough. History of lung cancer and radiation treatment. EXAM: CT CHEST WITHOUT CONTRAST TECHNIQUE: Multidetector CT imaging of the chest was performed following the standard protocol without IV contrast. COMPARISON:  Chest CT 10/06/2015 and chest radiograph 02/16/2016 FINDINGS: Mediastinum/Lymph Nodes: Again noted is a moderate to large sized hiatal hernia. Prominent mitral annular calcifications. There are some coronary artery calcifications. No significant pericardial fluid. No suspicious chest lymphadenopathy. Lungs/Pleura: Trachea and mainstem bronchi are patent. Scarring along the posterior aspect of the right upper lobe near the right major fissure. Again noted is an irregular nodular lesion in the right upper lobe with adjacent scarring. This nodular structure measures roughly 0.9 x 0.6 cm on sequence 3, image 56 and minimally changed from the previous examination. Again noted is septal thickening and linear densities in the right lower lung probably related  to the scarring. There is some volume loss at the base of the lingula which is  new. Scarring along the central aspect of the left lower lobe. Stable oval-shaped nodule in the superior segment of the left lower lobe measures 0.9 x 0.6 cm and stable. No new pulmonary nodules. Upper abdomen: Layering gallstones. No acute abnormality in the upper abdomen. Musculoskeletal: No acute bone abnormality. Again noted is multilevel disc space disease in lower thoracic spine and upper lumbar spine. IMPRESSION: No acute chest abnormality. Stable pulmonary nodules and post treatment changes in the right lung. Hiatal hernia. Gallstones. Electronically Signed   By: Markus Daft M.D.   On: 02/16/2016 18:53   Dg Chest Portable 1 View  02/16/2016  CLINICAL DATA:  Difficulty breathing EXAM: PORTABLE CHEST 1 VIEW COMPARISON:  01/10/2016 FINDINGS: Cardiac shadow is enlarged but stable. Small hiatal hernia is noted. The lungs are well aerated bilaterally. Mild interstitial changes are seen. No focal infiltrate is noted. Somewhat ovoid density is noted projecting over the right heart border. This likely represents pulmonary vascularity although follow-up examination would be helpful. IMPRESSION: Somewhat ovoid density overlying the right heart border of uncertain significance. This may simply represent prominent pulmonary vasculature. CT would be helpful for further evaluation. Electronically Signed   By: Inez Catalina M.D.   On: 02/16/2016 17:18     ASSESSMENT AND PLAN:   * Acute COPD exacerbation with acute hypoxic respiratory failure -IV steroids, Antibiotics - Scheduled Nebulizers - Inhalers -Wean O2 as tolerated - Consult pulmonary if no improvement  * Paroxysmal atrial fibrillation Continue medications.  * Hypertension Continue home medications  * Chronic diastolic CHF Patient has some worsening lower extremity edema. But no pulmonary edema on chest x-ray. Will increase her oral Lasix to twice a day.  * CKD stage  III is stable  * DVT prophylaxis with Lovenox  All the records are reviewed and case discussed with Care Management/Social Workerr. Management plans discussed with the patient, family and they are in agreement.  CODE STATUS: FULL CODE  DVT Prophylaxis: SCDs  TOTAL TIME TAKING CARE OF THIS PATIENT: 30 minutes.   POSSIBLE D/C IN 1-2 DAYS, DEPENDING ON CLINICAL CONDITION.  Hillary Bow R M.D on 02/17/2016 at 10:55 AM  Between 7am to 6pm - Pager - [phone]  After 6pm go to [email or web address] - password [email or web address] [place] [facility]  Office  [phone]  CC: Primary care physician; Pcp Not In System  Note: This dictation was prepared with Dragon dictation along with smaller phrase technology. Any transcriptional errors that result from this process are unintentional.

## 2016-02-18 LAB — MAGNESIUM: Magnesium: 1.9 mg/dL (ref 1.7–2.4)

## 2016-02-18 MED ORDER — AZITHROMYCIN 500 MG PO TABS
500.0000 mg | ORAL_TABLET | Freq: Every day | ORAL | Status: AC
  Administered 2016-02-19 – 2016-02-23 (×5): 500 mg via ORAL
  Filled 2016-02-18 (×3): qty 2
  Filled 2016-02-18 (×2): qty 1

## 2016-02-18 MED ORDER — FUROSEMIDE 40 MG PO TABS
40.0000 mg | ORAL_TABLET | Freq: Once | ORAL | Status: AC
  Administered 2016-02-18: 40 mg via ORAL
  Filled 2016-02-18: qty 1

## 2016-02-18 MED ORDER — METHYLPREDNISOLONE SODIUM SUCC 125 MG IJ SOLR
60.0000 mg | Freq: Two times a day (BID) | INTRAMUSCULAR | Status: DC
  Administered 2016-02-18 – 2016-02-22 (×9): 60 mg via INTRAVENOUS
  Filled 2016-02-18 (×9): qty 2

## 2016-02-18 MED ORDER — TRAZODONE HCL 50 MG PO TABS
50.0000 mg | ORAL_TABLET | Freq: Every day | ORAL | Status: DC
  Administered 2016-02-18 – 2016-02-25 (×8): 50 mg via ORAL
  Filled 2016-02-18 (×8): qty 1

## 2016-02-18 MED ORDER — PANTOPRAZOLE SODIUM 40 MG PO TBEC
40.0000 mg | DELAYED_RELEASE_TABLET | Freq: Two times a day (BID) | ORAL | Status: DC
Start: 2016-02-18 — End: 2016-02-26
  Administered 2016-02-18 – 2016-02-26 (×16): 40 mg via ORAL
  Filled 2016-02-18 (×16): qty 1

## 2016-02-18 NOTE — Progress Notes (Signed)
1944 Pt complains of gas and indigestion.  Provided dose of Maalox.  Indigestion and belching did not improve so gave IV dose of Zofran.  Pt continued to complain of belching and reports a choking feeling.  Pt given Rozerem for sleep at 2109.  Pt also complains of leg cramping and pain when in bed. Pt given Tylenol. Pt in and out of bed trying to get comfortable.  O2 Sat's in high 90's on 2 L [employer], HR SB to Afib.  Called Dr Jannifer Franklin to discuss and given order for IV Ativan at 2316.  Pt continues to be uncomfortable.  IV is leaking and needs to be replaced.  Pt is up to chair and finally able to sleep but HR in mid 40's and centralized tele called that pt has a Heart Block rhythm in addition to bradycardic and afib.  Called Dr Jannifer Franklin and received order for stat EKG and restart IV and collect magnesium.  New IV in right arm.  Magnesium drawn and sent to lab.  EKG done.  Will call results to Dr Jannifer Franklin with magnesium shortly.  Pt resting in bed, lungs are diminished but clear, no wheeze, complains of shortness of breath but O2 Sat is 100% on 2 L.  Pt AXOX4. Dorna Bloom RN

## 2016-02-18 NOTE — Progress Notes (Signed)
[name] at [place] NAME: Toni [name]    MR#:  [id number]  DATE OF BIRTH:  [birth year]  SUBJECTIVE:  CHIEF COMPLAINT:   Chief Complaint  Patient presents with  . Respiratory Distress   Continues to have shortness of breath. Had some epigastric pain yesterday with bloating which is improved. Dry cough. Lower extremity swelling is persistent.  REVIEW OF SYSTEMS:    Review of Systems  Constitutional: Positive for malaise/fatigue.  Respiratory: Positive for cough, sputum production, shortness of breath and wheezing. Negative for hemoptysis.   Cardiovascular: Positive for leg swelling. Negative for chest pain and orthopnea.  Gastrointestinal: Negative for nausea, vomiting, abdominal pain and diarrhea.  Genitourinary: Negative for dysuria.  Skin: Negative for rash.  Neurological: Positive for weakness. Negative for dizziness and focal weakness.    DRUG ALLERGIES:   Allergies  Allergen Reactions  . Belladonna Alkaloids Anaphylaxis and Other (See Comments)    Pt states that this medication makes her eyes cross.      VITALS:  Blood pressure 118/50, pulse 58, temperature 97.5 F (36.4 C), temperature source Oral, resp. rate 20, height '5\' 2"'$  (1.575 m), weight 84.052 kg (185 lb 4.8 oz), SpO2 100 %.  PHYSICAL EXAMINATION:   Physical Exam  GENERAL:  81 y.o.-year-old patient lying in the bed with Conversational dyspnea EYES: Pupils equal, round, reactive to light and accommodation. No scleral icterus. Extraocular muscles intact.  HEENT: Head atraumatic, normocephalic. Oropharynx and nasopharynx clear.  NECK:  Supple, no jugular venous distention. No thyroid enlargement, no tenderness.  LUNGS:  Bilateral wheezing with decreased air entry. Increase work of breathing CARDIOVASCULAR: S1, S2 normal. No murmurs, rubs, or gallops.  ABDOMEN: Soft, nontender, nondistended. Bowel sounds present. No organomegaly or mass.  EXTREMITIES: No  cyanosis, clubbing. 1+ lower extremity edema    NEUROLOGIC: Cranial nerves II through XII are intact. No focal Motor or sensory deficits b/l.   PSYCHIATRIC: The patient is alert and oriented x 3.  SKIN: No obvious rash, lesion, or ulcer.   LABORATORY PANEL:   CBC  Recent Labs Lab 02/17/16 0613  WBC 4.7  HGB 10.4*  HCT 31.9*  PLT 208   ------------------------------------------------------------------------------------------------------------------ Chemistries   Recent Labs Lab 02/17/16 0613 02/18/16 0110  NA 140  --   K 4.1  --   CL 104  --   CO2 28  --   GLUCOSE 168*  --   BUN 36*  --   CREATININE 1.44*  --   CALCIUM 8.9  --   MG  --  1.9   ------------------------------------------------------------------------------------------------------------------  Cardiac Enzymes  Recent Labs Lab 02/16/16 1647  TROPONINI 0.03   ------------------------------------------------------------------------------------------------------------------  RADIOLOGY:  Ct Chest Wo Contrast  02/16/2016  CLINICAL DATA:  Dyspnea and cough. History of lung cancer and radiation treatment. EXAM: CT CHEST WITHOUT CONTRAST TECHNIQUE: Multidetector CT imaging of the chest was performed following the standard protocol without IV contrast. COMPARISON:  Chest CT 10/06/2015 and chest radiograph 02/16/2016 FINDINGS: Mediastinum/Lymph Nodes: Again noted is a moderate to large sized hiatal hernia. Prominent mitral annular calcifications. There are some coronary artery calcifications. No significant pericardial fluid. No suspicious chest lymphadenopathy. Lungs/Pleura: Trachea and mainstem bronchi are patent. Scarring along the posterior aspect of the right upper lobe near the right major fissure. Again noted is an irregular nodular lesion in the right upper lobe with adjacent scarring. This nodular structure measures roughly 0.9 x 0.6 cm on sequence 3, image 56 and minimally changed from  the previous  examination. Again noted is septal thickening and linear densities in the right lower lung probably related to the scarring. There is some volume loss at the base of the lingula which is new. Scarring along the central aspect of the left lower lobe. Stable oval-shaped nodule in the superior segment of the left lower lobe measures 0.9 x 0.6 cm and stable. No new pulmonary nodules. Upper abdomen: Layering gallstones. No acute abnormality in the upper abdomen. Musculoskeletal: No acute bone abnormality. Again noted is multilevel disc space disease in lower thoracic spine and upper lumbar spine. IMPRESSION: No acute chest abnormality. Stable pulmonary nodules and post treatment changes in the right lung. Hiatal hernia. Gallstones. Electronically Signed   By: Markus Daft M.D.   On: 02/16/2016 18:53   Dg Chest Portable 1 View  02/16/2016  CLINICAL DATA:  Difficulty breathing EXAM: PORTABLE CHEST 1 VIEW COMPARISON:  01/10/2016 FINDINGS: Cardiac shadow is enlarged but stable. Small hiatal hernia is noted. The lungs are well aerated bilaterally. Mild interstitial changes are seen. No focal infiltrate is noted. Somewhat ovoid density is noted projecting over the right heart border. This likely represents pulmonary vascularity although follow-up examination would be helpful. IMPRESSION: Somewhat ovoid density overlying the right heart border of uncertain significance. This may simply represent prominent pulmonary vasculature. CT would be helpful for further evaluation. Electronically Signed   By: Inez Catalina M.D.   On: 02/16/2016 17:18     ASSESSMENT AND PLAN:   * Acute COPD exacerbation with acute hypoxic respiratory failure -IV steroids, Antibiotics - Scheduled Nebulizers - Inhalers -Wean O2 as tolerated - Consult pulmonary if no improvement  Likely discharge tomorrow  * Paroxysmal atrial fibrillation Continue medications.  * Hypertension Continue home medications  * Chronic diastolic CHF Patient has  some worsening lower extremity edema. But no pulmonary edema on chest x-ray.  We'll give extra dose of Lasix today.  * CKD stage III is stable  * DVT prophylaxis with Lovenox  All the records are reviewed and case discussed with Care Management/Social Workerr. Management plans discussed with the patient, family and they are in agreement.  CODE STATUS: FULL CODE  DVT Prophylaxis: SCDs  TOTAL TIME TAKING CARE OF THIS PATIENT: 30 minutes.   Hillary Bow R M.D on 02/18/2016 at 12:30 PM  Between 7am to 6pm - Pager - [phone]  After 6pm go to [email or web address] - password [email or web address] [facility]  Office  [phone]  CC: Primary care physician; Pcp Not In System  Note: This dictation was prepared with Dragon dictation along with smaller phrase technology. Any transcriptional errors that result from this process are unintentional.

## 2016-02-19 LAB — BASIC METABOLIC PANEL
ANION GAP: 13 (ref 5–15)
BUN: 75 mg/dL — ABNORMAL HIGH (ref 6–20)
CALCIUM: 8.9 mg/dL (ref 8.9–10.3)
CO2: 27 mmol/L (ref 22–32)
Chloride: 98 mmol/L — ABNORMAL LOW (ref 101–111)
Creatinine, Ser: 2.1 mg/dL — ABNORMAL HIGH (ref 0.44–1.00)
GFR calc non Af Amer: 21 mL/min — ABNORMAL LOW (ref >60)
GFR, EST AFRICAN AMERICAN: 25 mL/min — AB (ref >60)
Glucose, Bld: 138 mg/dL — ABNORMAL HIGH (ref 65–99)
Potassium: 3.9 mmol/L (ref 3.5–5.1)
SODIUM: 138 mmol/L (ref 135–145)

## 2016-02-19 MED ORDER — POLYETHYLENE GLYCOL 3350 17 G PO PACK: 17.0000 g | PACK | Freq: Once | ORAL | Status: DC

## 2016-02-19 MED ORDER — DIPHENHYDRAMINE HCL 25 MG PO CAPS
50.0000 mg | ORAL_CAPSULE | Freq: Every evening | ORAL | Status: DC | PRN
Start: 2016-02-19 — End: 2016-02-25
  Administered 2016-02-19 – 2016-02-23 (×3): 50 mg via ORAL
  Filled 2016-02-19 (×3): qty 2

## 2016-02-19 MED ORDER — POLYETHYLENE GLYCOL 3350 17 G PO PACK
17.0000 g | PACK | Freq: Every day | ORAL | Status: DC | PRN
  Administered 2016-02-20 – 2016-02-21 (×2): 17 g via ORAL
  Filled 2016-02-19 (×2): qty 1

## 2016-02-19 MED ORDER — MORPHINE SULFATE (PF) 2 MG/ML IV SOLN
2.0000 mg | INTRAVENOUS | Status: DC | PRN
  Administered 2016-02-19: 2 mg via INTRAVENOUS
  Filled 2016-02-19: qty 1

## 2016-02-19 MED ORDER — ENOXAPARIN SODIUM 30 MG/0.3ML ~~LOC~~ SOLN
30.0000 mg | SUBCUTANEOUS | Status: DC
  Administered 2016-02-19 – 2016-02-24 (×6): 30 mg via SUBCUTANEOUS
  Filled 2016-02-19 (×7): qty 0.3

## 2016-02-19 MED ORDER — BISACODYL 5 MG PO TBEC
10.0000 mg | DELAYED_RELEASE_TABLET | Freq: Once | ORAL | Status: AC
  Administered 2016-02-19: 10 mg via ORAL
  Filled 2016-02-19: qty 2

## 2016-02-19 NOTE — Progress Notes (Signed)
Anticoagulation monitoring(Lovenox):  81 yo  ordered Lovenox 40 mg Q24h  Filed Weights   02/17/16 0500 02/18/16 0500 02/19/16 0500  Weight: 186 lb 3.2 oz (84.46 kg) 185 lb 4.8 oz (84.052 kg) 190 lb 14.4 oz (86.592 kg)   Body mass index is 34.91 kg/(m^2).   Lab Results  Component Value Date   CREATININE 2.10* 02/19/2016   CREATININE 1.44* 02/17/2016   CREATININE 1.22* 02/16/2016   Estimated Creatinine Clearance: 22.2 mL/min (by C-G formula based on Cr of 2.1). Hemoglobin & Hematocrit     Component Value Date/Time   HGB 10.4* 02/17/2016 0613   HGB 9.1* 08/05/2014 1407   HCT 31.9* 02/17/2016 0613   HCT 29.4* 08/05/2014 1407     Per Protocol for Patient with estCrcl< 30 ml/min and BMI < 40, will transition to Lovenox 30 mg Q24h.

## 2016-02-19 NOTE — Progress Notes (Signed)
[facility] at [place] NAME: Toni [name]    MR#:  [id number]  DATE OF BIRTH:  28-Mar-1935  SUBJECTIVE:  CHIEF COMPLAINT:   Chief Complaint  Patient presents with  . Respiratory Distress   Feels weak. Continues to have shortness of breath. Constipated. Afebrile.  REVIEW OF SYSTEMS:    Review of Systems  Constitutional: Positive for malaise/fatigue.  Respiratory: Positive for cough, sputum production, shortness of breath and wheezing. Negative for hemoptysis.   Cardiovascular: Positive for leg swelling. Negative for chest pain and orthopnea.  Gastrointestinal: Negative for nausea, vomiting, abdominal pain and diarrhea.  Genitourinary: Negative for dysuria.  Skin: Negative for rash.  Neurological: Positive for weakness. Negative for dizziness and focal weakness.    DRUG ALLERGIES:   Allergies  Allergen Reactions  . Belladonna Alkaloids Anaphylaxis and Other (See Comments)    Pt states that this medication makes her eyes cross.      VITALS:  Blood pressure 117/54, pulse 63, temperature 97.5 F (36.4 C), temperature source Oral, resp. rate 18, height '5\' 2"'$  (1.575 m), weight 86.592 kg (190 lb 14.4 oz), SpO2 99 %.  PHYSICAL EXAMINATION:   Physical Exam  GENERAL:  81 y.o.-year-old patient lying in the bed with Conversational dyspnea EYES: Pupils equal, round, reactive to light and accommodation. No scleral icterus. Extraocular muscles intact.  HEENT: Head atraumatic, normocephalic. Oropharynx and nasopharynx clear.  NECK:  Supple, no jugular venous distention. No thyroid enlargement, no tenderness.  LUNGS:  Bilateral wheezing with decreased air entry. Increase work of breathing CARDIOVASCULAR: S1, S2 normal. No murmurs, rubs, or gallops.  ABDOMEN: Soft, nontender, nondistended. Bowel sounds present. No organomegaly or mass.  EXTREMITIES: No cyanosis, clubbing. 1+ lower extremity edema    NEUROLOGIC: Cranial nerves II through  XII are intact. No focal Motor or sensory deficits b/l.   PSYCHIATRIC: The patient is alert and oriented x 3.  SKIN: No obvious rash, lesion, or ulcer.   LABORATORY PANEL:   CBC  Recent Labs Lab 02/17/16 0613  WBC 4.7  HGB 10.4*  HCT 31.9*  PLT 208   ------------------------------------------------------------------------------------------------------------------ Chemistries   Recent Labs Lab 02/18/16 0110 02/19/16 0639  NA  --  138  K  --  3.9  CL  --  98*  CO2  --  27  GLUCOSE  --  138*  BUN  --  75*  CREATININE  --  2.10*  CALCIUM  --  8.9  MG 1.9  --    ------------------------------------------------------------------------------------------------------------------  Cardiac Enzymes  Recent Labs Lab 02/16/16 1647  TROPONINI 0.03   ------------------------------------------------------------------------------------------------------------------  RADIOLOGY:  No results found.   ASSESSMENT AND PLAN:   * Acute renal failure over CKD stage III Worsening creatinine. Hold Lasix. Monitor input and output  * Acute COPD exacerbation with acute hypoxic respiratory failure -IV steroids, Antibiotics - Scheduled Nebulizers - Inhalers -Wean O2 as tolerated  * Paroxysmal atrial fibrillation Continue medications.  * Hypertension Continue home medications  * Chronic diastolic CHF Hold Lasix due to worsening creatinine  * DVT prophylaxis with Lovenox  All the records are reviewed and case discussed with Care Management/Social Workerr. Management plans discussed with the patient, family and they are in agreement.  CODE STATUS: FULL CODE  DVT Prophylaxis: SCDs  TOTAL TIME TAKING CARE OF THIS PATIENT: 30 minutes.   Hillary Bow R M.D on 02/19/2016 at 1:45 PM  Between 7am to 6pm - Pager - [phone]  After 6pm go to [email or web address] -  password [email or web address] [facility]  [facility]  Office  [phone]  CC: Primary care physician; Pcp Not In  System  Note: This dictation was prepared with Dragon dictation along with smaller phrase technology. Any transcriptional errors that result from this process are unintentional.

## 2016-02-20 ENCOUNTER — Inpatient Hospital Stay: Payer: Medicare Other

## 2016-02-20 LAB — BASIC METABOLIC PANEL
ANION GAP: 11 (ref 5–15)
BUN: 77 mg/dL — ABNORMAL HIGH (ref 6–20)
CALCIUM: 8.8 mg/dL — AB (ref 8.9–10.3)
CO2: 28 mmol/L (ref 22–32)
Chloride: 99 mmol/L — ABNORMAL LOW (ref 101–111)
Creatinine, Ser: 1.94 mg/dL — ABNORMAL HIGH (ref 0.44–1.00)
GFR calc non Af Amer: 23 mL/min — ABNORMAL LOW (ref >60)
GFR, EST AFRICAN AMERICAN: 27 mL/min — AB (ref >60)
Glucose, Bld: 142 mg/dL — ABNORMAL HIGH (ref 65–99)
POTASSIUM: 4.2 mmol/L (ref 3.5–5.1)
Sodium: 138 mmol/L (ref 135–145)

## 2016-02-20 MED ORDER — BISACODYL 10 MG RE SUPP
10.0000 mg | Freq: Once | RECTAL | Status: AC
  Administered 2016-02-20: 10 mg via RECTAL
  Filled 2016-02-20: qty 1

## 2016-02-20 NOTE — Discharge Instructions (Signed)
°  DIET:  Cardiac diet  DISCHARGE CONDITION:  Stable  ACTIVITY:  Activity as tolerated  OXYGEN:  Home Oxygen: Yes.     Oxygen Delivery: 2 liters/min via Patient connected to nasal cannula oxygen  DISCHARGE LOCATION:  Home with home health   If you experience worsening of your admission symptoms, develop shortness of breath, life threatening emergency, suicidal or homicidal thoughts you must seek medical attention immediately by calling 911 or calling your MD immediately  if symptoms less severe.  You Must read complete instructions/literature along with all the possible adverse reactions/side effects for all the Medicines you take and that have been prescribed to you. Take any new Medicines after you have completely understood and accpet all the possible adverse reactions/side effects.   Please note  You were cared for by a hospitalist during your hospital stay. If you have any questions about your discharge medications or the care you received while you were in the hospital after you are discharged, you can call the unit and asked to speak with the hospitalist on call if the hospitalist that took care of you is not available. Once you are discharged, your primary care physician will handle any further medical issues. Please note that NO REFILLS for any discharge medications will be authorized once you are discharged, as it is imperative that you return to your primary care physician (or establish a relationship with a primary care physician if you do not have one) for your aftercare needs so that they can reassess your need for medications and monitor your lab values.

## 2016-02-20 NOTE — Evaluation (Signed)
Physical Therapy Evaluation Patient Details Name: NECIE WILCOXSON MRN: [id number] DOB: 04-Jan-1935 Today's Date: 02/20/2016   History of Present Illness  Imanie Darrow is a 81 y.o. female who presents with Progressive shortness of breath. Patient states that her breathing has been getting progressively worse over the past 2-3 days. She denies any fever or chills, significant increase in sputum production, states that she has had somewhat of a cough as well as some wheezing. She states that her home treatments were progressively less effective as well. She went and saw her pulmonologist this morning and was put on by mouth prednisone as well as by mouth Levaquin, but states that throughout the day her breathing discussing if worse. She came to the ED for evaluation and was found to be initially hypoxic, which corrected well with supple oxygen, she is on 4 L of O2 nasal cannula the time of this interview with good O2 saturation.Lakelynn Severtson is a 81 y.o. female who presents with Progressive shortness of breath. Patient states that her breathing has been getting progressively worse over the past 2-3 days. She denies any fever or chills, significant increase in sputum production, states that she has had somewhat of a cough as well as some wheezing. She states that her home treatments were progressively less effective as well. She went and saw her pulmonologist this morning and was put on by mouth prednisone as well as by mouth Levaquin, but states that throughout the day her breathing discussing if worse. She came to the ED for evaluation and was found to be initially hypoxic, which corrected well with supple oxygen, she is on 4 L of O2 nasal cannula the time of this interview with good O2 saturation.  Clinical Impression  Patient has independent bed mobility and is modified independent with transfers and ambulation with RW. She is able to perform 90 feet of ambulation with 2 L O2 and CGA. She has WFL strength BLE  and will benefit from skilled PT to improve ambulation and return to PLOF without AD for mobility needs.     Follow Up Recommendations Home health PT    Equipment Recommendations       Recommendations for Other Services       Precautions / Restrictions Precautions Precautions: Fall Restrictions Weight Bearing Restrictions: No      Mobility  Bed Mobility Overal bed mobility: Independent                Transfers Overall transfer level: Modified independent Equipment used: Rolling walker (2 wheeled)                Ambulation/Gait Ambulation/Gait assistance: Modified independent (Device/Increase time) Ambulation Distance (Feet): 90 Feet Assistive device: Rolling walker (2 wheeled) Gait Pattern/deviations: Step-through pattern   Gait velocity interpretation: <1.8 ft/sec, indicative of risk for recurrent falls    Stairs            Wheelchair Mobility    Modified Rankin (Stroke Patients Only)       Balance Overall balance assessment: Modified Independent                                           Pertinent Vitals/Pain Pain Assessment: No/denies pain    Home Living Family/patient expects to be discharged to:: Private residence Living Arrangements: Spouse/significant other Available Help at Discharge: Family Type of Home: House Home Access: Stairs to enter  Entrance Stairs-Rails: Right Entrance Stairs-Number of Steps: 2 Home Layout: One level Home Equipment: Walker - 2 wheels      Prior Function Level of Independence: Independent               Hand Dominance        Extremity/Trunk Assessment   Upper Extremity Assessment: Overall WFL for tasks assessed           Lower Extremity Assessment: Overall WFL for tasks assessed         Communication   Communication: No difficulties  Cognition Arousal/Alertness: Awake/alert Behavior During Therapy: WFL for tasks assessed/performed Overall Cognitive Status:  Within Functional Limits for tasks assessed                      General Comments      Exercises        Assessment/Plan    PT Assessment Patient needs continued PT services  PT Diagnosis Difficulty walking   PT Problem List Decreased strength;Decreased balance;Decreased mobility  PT Treatment Interventions Gait training;Therapeutic activities;Therapeutic exercise   PT Goals (Current goals can be found in the Care Plan section) Acute Rehab PT Goals Patient Stated Goal: to go home PT Goal Formulation: With patient Time For Goal Achievement: 03/05/16 Potential to Achieve Goals: Good    Frequency Min 2X/week   Barriers to discharge        Co-evaluation               End of Session Equipment Utilized During Treatment: Gait belt Activity Tolerance: Patient tolerated treatment well Patient left: in chair           Time: 1020-1040 PT Time Calculation (min) (ACUTE ONLY): 20 min   Charges:   PT Evaluation $PT Eval Low Complexity: 1 Procedure     PT G Codes:      Alanson Puls, PT, DPT  [place]ixie, Minette Headland S 02/20/2016, 11:51 AM

## 2016-02-21 ENCOUNTER — Inpatient Hospital Stay: Payer: Medicare Other

## 2016-02-21 DIAGNOSIS — J9600 Acute respiratory failure, unspecified whether with hypoxia or hypercapnia: Secondary | ICD-10-CM

## 2016-02-21 MED ORDER — LORAZEPAM 0.5 MG PO TABS
0.5000 mg | ORAL_TABLET | Freq: Every evening | ORAL | Status: DC | PRN
  Administered 2016-02-21: 0.5 mg via ORAL
  Filled 2016-02-21: qty 1

## 2016-02-21 MED ORDER — FLEET ENEMA 7-19 GM/118ML RE ENEM
1.0000 | ENEMA | Freq: Once | RECTAL | Status: AC
  Administered 2016-02-21: 1 via RECTAL

## 2016-02-21 MED ORDER — BISACODYL 10 MG RE SUPP
10.0000 mg | Freq: Once | RECTAL | Status: AC
  Administered 2016-02-21: 10 mg via RECTAL
  Filled 2016-02-21: qty 1

## 2016-02-21 MED ORDER — FUROSEMIDE 10 MG/ML IJ SOLN
60.0000 mg | Freq: Once | INTRAMUSCULAR | Status: AC
  Administered 2016-02-21: 60 mg via INTRAVENOUS
  Filled 2016-02-21: qty 6

## 2016-02-21 NOTE — Progress Notes (Signed)
[facility] at [place] NAME: Toni [name]    MR#:  [id number]  DATE OF BIRTH:  1935-01-13  SUBJECTIVE:  CHIEF COMPLAINT:   Chief Complaint  Patient presents with  . Respiratory Distress    Laying in bed. Poor appetite. Dry cough. Afebrile Still short of breath. Today she is on more oxygen at 3 L and was on 2 L/min yesterday.  REVIEW OF SYSTEMS:    Review of Systems  Constitutional: Positive for malaise/fatigue.  Respiratory: Positive for cough, sputum production, shortness of breath and wheezing. Negative for hemoptysis.   Cardiovascular: Positive for leg swelling. Negative for chest pain and orthopnea.  Gastrointestinal: Negative for nausea, vomiting, abdominal pain and diarrhea.  Genitourinary: Negative for dysuria.  Skin: Negative for rash.  Neurological: Positive for weakness. Negative for dizziness and focal weakness.    DRUG ALLERGIES:   Allergies  Allergen Reactions  . Belladonna Alkaloids Anaphylaxis and Other (See Comments)    Pt states that this medication makes her eyes cross.      VITALS:  Blood pressure 115/85, pulse 57, temperature 97.5 F (36.4 C), temperature source Oral, resp. rate 17, height '5\' 2"'$  (1.575 m), weight 85.503 kg (188 lb 8 oz), SpO2 100 %.  PHYSICAL EXAMINATION:   Physical Exam  GENERAL:  81 y.o.-year-old patient lying in the bed with Conversational dyspnea. EYES: Pupils equal, round, reactive to light and accommodation. No scleral icterus. Extraocular muscles intact. HEENT: Head atraumatic, normocephalic. Oropharynx and nasopharynx clear. NECK:  Supple, no jugular venous distention. No thyroid enlargement, no tenderness.  LUNGS:  Bilateral wheezing with decreased air entry. Increase work of breathing. CARDIOVASCULAR: S1, S2 normal. No murmurs, rubs, or gallops. ABDOMEN: Soft, nontender, nondistended. Bowel sounds present. No organomegaly or mass.  EXTREMITIES: No cyanosis, clubbing. 1+  lower extremity edema. NEUROLOGIC: Cranial nerves II through XII are intact. No focal Motor or sensory deficits b/l.   PSYCHIATRIC: The patient is alert and oriented x 3.  SKIN: No obvious rash, lesion, or ulcer.   LABORATORY PANEL:   CBC  Recent Labs Lab 02/17/16 0613  WBC 4.7  HGB 10.4*  HCT 31.9*  PLT 208   ------------------------------------------------------------------------------------------------------------------ Chemistries   Recent Labs Lab 02/18/16 0110  02/20/16 0523  NA  --   < > 138  K  --   < > 4.2  CL  --   < > 99*  CO2  --   < > 28  GLUCOSE  --   < > 142*  BUN  --   < > 77*  CREATININE  --   < > 1.94*  CALCIUM  --   < > 8.8*  MG 1.9  --   --   < > = values in this interval not displayed. ------------------------------------------------------------------------------------------------------------------  Cardiac Enzymes  Recent Labs Lab 02/16/16 1647  TROPONINI 0.03   ------------------------------------------------------------------------------------------------------------------  RADIOLOGY:  Dg Chest 2 View  02/20/2016  CLINICAL DATA:  Shortness of breath. EXAM: CHEST  2 VIEW COMPARISON:  Chest radiograph and chest CT Feb 16, 2016 FINDINGS: Patchy infiltrate in the right mid lung remains stable. There is no new opacity evident. Heart is mildly enlarged with pulmonary vascularity within normal limits. There is atherosclerotic calcification in the aorta. There is a focal hiatal hernia. No adenopathy is evident by radiography. There is extensive calcification in the mitral annulus. There is degenerative change in both shoulders. IMPRESSION: Persistent patchy infiltrate right mid lung. No new opacity. Hiatal hernia present.  Mild cardiomegaly is stable. Electronically Signed   By: Lowella Grip III M.D.   On: 02/20/2016 18:36     ASSESSMENT AND PLAN:   * Acute renal failure over CKD stage III Improved.  * Acute COPD exacerbation with acute  hypoxic respiratory failure - no significant improvement in spite of aggressive treatment. -IV steroids, Antibiotics - Scheduled Nebulizers - Inhalers -Wean O2 as tolerated - Repeat chest x-ray showed scarring. No infiltrate or edema. Requested pulmonary consult. Discussed with Dr. Rosita Fire of pulmonary. Advised checking an echocardiogram. Ordered  * Paroxysmal atrial fibrillation Continue medications  * Hypertension Continue home medications  * Chronic diastolic CHF Restarted oral Lasix She does have worsening lower extremity edema. We'll give 1 dose of IV Lasix today.  * DVT prophylaxis with Lovenox  All the records are reviewed and case discussed with Care Management/Social Workerr. Management plans discussed with the patient, family and they are in agreement.  CODE STATUS: FULL CODE  DVT Prophylaxis: SCDs  TOTAL TIME TAKING CARE OF THIS PATIENT: 30 minutes.   Hillary Bow R M.D on 02/21/2016 at 2:37 PM  Between 7am to 6pm - Pager - [phone]  After 6pm go to [email or web address] - password [email or web address] [facility]  Office  [phone]  CC: Primary care physician; Pcp Not In System  Note: This dictation was prepared with Dragon dictation along with smaller phrase technology. Any transcriptional errors that result from this process are unintentional.

## 2016-02-21 NOTE — Consult Note (Signed)
PULMONARY CONSULT NOTE  Requesting MD/Service: Sudini Date of initial consultation: 02/21/16 Reason for consultation: AECOPD  PT PROFILE: 72 F followed by Dr Mortimer Fries in [facility] for severe COPD with oxygen dependence. Seen in office 05/31 with acute exacerbation and offered admission. She opted to try outpt mgmt but was admitted later that day   HPI:  As above. She feels that she has made some improvement but still is not back to baseline. No CP, fever, purulent sputum, hemoptysis. She does have increased LE edema   Past Medical History  Diagnosis Date  . Cataract   . COPD (chronic obstructive pulmonary disease) ([place])   . Essential hypertension, benign   . Hypothyroidism   . Atypical atrial flutter ([place]) 11/16/2013    a. CHA2DS2VASc = 5-->prev on xarelto, d/c'd due to bleeding skin tear;  c. 11/2013->Placed on flecainide.  . Chronic diastolic heart failure ([place])   . PAF (paroxysmal atrial fibrillation) ([place])     a. 11/2013 Echo: EF 55-60%, mild AS/MR, mod dil LA/RA, PASP 21mHg;  b. CHA2DS2VASc = 5-->prev on xarelto, d/c'd due to bleeding skin tear;  c. 11/2013->Placed on flecainide.  . Lung nodule     Followed by Dr. FRaul Del . 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 Lung cancer ([place]   . Collagen vascular disease ([place]   . Hypokalemia   . Chronic diastolic CHF (congestive heart failure) ([place]     a. 11/2013 Echo: EF 55-60%, mild AS/MR, mod dil LA/RA, PASP 355mg.    Past Surgical History  Procedure Laterality Date  . Eye surgery    . Total knee arthroplasty Bilateral     MEDICATIONS: I have reviewed all medications and confirmed regimen as documented  Social History   Social History  . Marital Status: Married    Spouse Name: N/A  . Number of Children: N/A  . Years of Education: N/A   Occupational History  . Not on file.   Social History Main Topics  . Smoking status: Former Smoker -- 1.50 packs/day for 50 years    Types: Cigarettes     Quit date: 09/18/2002  . Smokeless tobacco: Never Used  . Alcohol Use: No  . Drug Use: No  . Sexual Activity: Not on file   Other Topics Concern  . Not on file   Social History Narrative    Family History  Problem Relation Age of Onset  . CAD Father   . Heart disease Father   . Heart attack Father   . Arrhythmia Sister     ROS: No fever, myalgias/arthralgias, unexplained weight loss or weight gain No new focal weakness or sensory deficits No otalgia, hearing loss, visual changes, nasal and sinus symptoms, mouth and throat problems No neck pain or adenopathy No abdominal pain, N/V/D, diarrhea, change in bowel pattern No dysuria, change in urinary pattern   Filed Vitals:   02/20/16 2030 02/21/16 0400 02/21/16 0500 02/21/16 0505  BP: 127/56   115/85  Pulse: 58   57  Temp: 97.6 F (36.4 C)   97.5 F (36.4 C)  TempSrc: Oral   Oral  Resp:    17  Height:      Weight:   188 lb 8 oz (85.503 kg)   SpO2: 97% 95%  100%     EXAM:  Gen: appears fatigued, No overt respiratory distress @ rest HEENT: NCAT, sclera white, oropharynx normal Neck: Supple without LAN, thyromegaly, JVD Lungs: breath sounds moderately to severely diminished,  percussion note normal, No adventitious sounds Cardiovascular: Normal rate, reg rhythm, + systolic M Abdomen: Soft, nontender, normal BS Ext: without clubbing, cyanosis. 2+ symmetric edema Neuro: CNs grossly intact, motor and sensory intact Skin: Limited exam, no lesions noted  DATA:   BMP Latest Ref Rng 02/20/2016 02/19/2016 02/17/2016  Glucose 65 - 99 mg/dL 142(H) 138(H) 168(H)  BUN 6 - 20 mg/dL 77(H) 75(H) 36(H)  Creatinine 0.44 - 1.00 mg/dL 1.94(H) 2.10(H) 1.44(H)  Sodium 135 - 145 mmol/L 138 138 140  Potassium 3.5 - 5.1 mmol/L 4.2 3.9 4.1  Chloride 101 - 111 mmol/L 99(L) 98(L) 104  CO2 22 - 32 mmol/L 28 27 28   Calcium 8.9 - 10.3 mg/dL 8.8(L) 8.9 8.9    CBC Latest Ref Rng 02/17/2016 02/16/2016 12/21/2015  WBC 3.6 - 11.0 K/uL 4.7 7.4 13.1(H)   Hemoglobin 12.0 - 16.0 g/dL 10.4(L) 12.0 10.9(L)  Hematocrit 35.0 - 47.0 % 31.9(L) 36.8 33.6(L)  Platelets 150 - 440 K/uL 208 228 319    CXR:    IMPRESSION:   1) Severe baseline COPD, oxygen dependent 2) acute on chronic hypoxic respiratory failure 3) AECOPD - slow to resolve 4) increased LE edema 5) AKI   PLAN:  Cont supplemental O2, systemic steroids, nebulized steroids and bronchodilators and empiric antibiotics Discussed with Dr Darvin Neighbours - look for complicating factors or other diagnoses. Consider Echocardiogram Agree with diuresis to extent permitted by renal function and BP    Merton Border, MD PCCM service Mobile [phone] Pager [phone] 02/22/2016

## 2016-02-21 NOTE — Care Management Important Message (Signed)
Important Message  Patient Details  Name: Toni [name] MRN: [id number] Date of Birth: 02-18-1935   Medicare Important Message Given:  Yes    Shelbie Ammons, RN 02/21/2016, 8:38 AM

## 2016-02-21 NOTE — Progress Notes (Signed)
[place] at [place] NAME: Toni [name]    MR#:  [id number]  DATE OF BIRTH:  12-23-1934  SUBJECTIVE:  CHIEF COMPLAINT:   Chief Complaint  Patient presents with  . Respiratory Distress   Continues to have shortness of breath. Dry cough. No chest pain. No orthopnea. Worsening shortness of breath with minimal ambulation to the bathroom.  REVIEW OF SYSTEMS:    Review of Systems  Constitutional: Positive for malaise/fatigue.  Respiratory: Positive for cough, sputum production, shortness of breath and wheezing. Negative for hemoptysis.   Cardiovascular: Positive for leg swelling. Negative for chest pain and orthopnea.  Gastrointestinal: Negative for nausea, vomiting, abdominal pain and diarrhea.  Genitourinary: Negative for dysuria.  Skin: Negative for rash.  Neurological: Positive for weakness. Negative for dizziness and focal weakness.    DRUG ALLERGIES:   Allergies  Allergen Reactions  . Belladonna Alkaloids Anaphylaxis and Other (See Comments)    Pt states that this medication makes her eyes cross.      VITALS:  Blood pressure 115/85, pulse 57, temperature 97.5 F (36.4 C), temperature source Oral, resp. rate 17, height '5\' 2"'$  (1.575 m), weight 85.503 kg (188 lb 8 oz), SpO2 100 %.  PHYSICAL EXAMINATION:   Physical Exam  GENERAL:  81 y.o.-year-old patient lying in the bed with Conversational dyspnea. EYES: Pupils equal, round, reactive to light and accommodation. No scleral icterus. Extraocular muscles intact. HEENT: Head atraumatic, normocephalic. Oropharynx and nasopharynx clear. NECK:  Supple, no jugular venous distention. No thyroid enlargement, no tenderness.  LUNGS:  Bilateral wheezing with decreased air entry. Increase work of breathing. CARDIOVASCULAR: S1, S2 normal. No murmurs, rubs, or gallops. ABDOMEN: Soft, nontender, nondistended. Bowel sounds present. No organomegaly or mass.  EXTREMITIES: No cyanosis,  clubbing. 1+ lower extremity edema. NEUROLOGIC: Cranial nerves II through XII are intact. No focal Motor or sensory deficits b/l.   PSYCHIATRIC: The patient is alert and oriented x 3.  SKIN: No obvious rash, lesion, or ulcer.   LABORATORY PANEL:   CBC  Recent Labs Lab 02/17/16 0613  WBC 4.7  HGB 10.4*  HCT 31.9*  PLT 208   ------------------------------------------------------------------------------------------------------------------ Chemistries   Recent Labs Lab 02/18/16 0110  02/20/16 0523  NA  --   < > 138  K  --   < > 4.2  CL  --   < > 99*  CO2  --   < > 28  GLUCOSE  --   < > 142*  BUN  --   < > 77*  CREATININE  --   < > 1.94*  CALCIUM  --   < > 8.8*  MG 1.9  --   --   < > = values in this interval not displayed. ------------------------------------------------------------------------------------------------------------------  Cardiac Enzymes  Recent Labs Lab 02/16/16 1647  TROPONINI 0.03   ------------------------------------------------------------------------------------------------------------------  RADIOLOGY:  Dg Chest 2 View  02/20/2016  CLINICAL DATA:  Shortness of breath. EXAM: CHEST  2 VIEW COMPARISON:  Chest radiograph and chest CT Feb 16, 2016 FINDINGS: Patchy infiltrate in the right mid lung remains stable. There is no new opacity evident. Heart is mildly enlarged with pulmonary vascularity within normal limits. There is atherosclerotic calcification in the aorta. There is a focal hiatal hernia. No adenopathy is evident by radiography. There is extensive calcification in the mitral annulus. There is degenerative change in both shoulders. IMPRESSION: Persistent patchy infiltrate right mid lung. No new opacity. Hiatal hernia present. Mild cardiomegaly is stable. Electronically  Signed   By: Lowella Grip III M.D.   On: 02/20/2016 18:36     ASSESSMENT AND PLAN:   * Acute renal failure over CKD stage III Creatinine is improving. We will start  oral Lasix from tomorrow. Monitor input and output  * Acute COPD exacerbation with acute hypoxic respiratory failure -IV steroids, Antibiotics - Scheduled Nebulizers - Inhalers -Wean O2 as tolerated - Repeat chest x-ray showed scarring. No infiltrate or edema.  * Paroxysmal atrial fibrillation Continue medications  * Hypertension Continue home medications  * Chronic diastolic CHF Restart oral Lasix  * DVT prophylaxis with Lovenox  All the records are reviewed and case discussed with Care Management/Social Workerr. Management plans discussed with the patient, family and they are in agreement.  CODE STATUS: FULL CODE  DVT Prophylaxis: SCDs  TOTAL TIME TAKING CARE OF THIS PATIENT: 30 minutes.   Hillary Bow R M.D on 02/21/2016 at 2:35 PM  Between 7am to 6pm - Pager - [phone]  After 6pm go to [email or web address] - password [email or web address] [facility]  Office  [phone]  CC: Primary care physician; Pcp Not In System  Note: This dictation was prepared with Dragon dictation along with smaller phrase technology. Any transcriptional errors that result from this process are unintentional.

## 2016-02-22 ENCOUNTER — Inpatient Hospital Stay (HOSPITAL_COMMUNITY)
Admit: 2016-02-22 | Discharge: 2016-02-22 | Disposition: A | Discharge status: Home / Self Care | Payer: Medicare Other | Attending: Internal Medicine | Admitting: Internal Medicine

## 2016-02-22 DIAGNOSIS — I5031 Acute diastolic (congestive) heart failure: Secondary | ICD-10-CM

## 2016-02-22 LAB — ECHOCARDIOGRAM COMPLETE
AO mean calculated velocity dopler: 240 cm/s
AOPV: 0.38 m/s
AV Area VTI index: 0.68 cm2/m2
AV Mean grad: 27 mmHg
AV area mean vel ind: 0.62 cm2/m2
AV pk vel: 348 cm/s
AV vel: 1.3
AVAREAMEANV: 1.18 cm2
AVAREAVTI: 1.2 cm2
AVCELMEANRAT: 0.38
AVPG: 48 mmHg
CHL CUP AV PEAK INDEX: 0.63
CHL CUP AV VALUE AREA INDEX: 0.68
CHL CUP MV DEC (S): 216
E decel time: 216 msec
FS: 31 % (ref 28–44)
Height: 62 in
IV/PV OW: 0.76
LA diam end sys: 45 mm
LA vol A4C: 113 ml
LA vol: 113 mL
LADIAMINDEX: 2.36 cm/m2
LASIZE: 45 mm
LAVOLIN: 59.2 mL/m2
LDCA: 3.14 cm2
LVOT SV: 112 mL
LVOT VTI: 35.6 cm
LVOT peak grad rest: 7 mmHg
LVOTD: 20 mm
LVOTPV: 133 cm/s
LVOTVTI: 0.41 cm
MV Peak grad: 9 mmHg
MVPKEVEL: 148 m/s
PW: 16.1 mm — AB (ref 0.6–1.1)
TAPSE: 21.2 mm
VTI: 86 cm
Valve area: 1.3 cm2
Weight: 3211.2 oz

## 2016-02-22 LAB — BASIC METABOLIC PANEL
ANION GAP: 10 (ref 5–15)
BUN: 86 mg/dL — ABNORMAL HIGH (ref 6–20)
CALCIUM: 8.5 mg/dL — AB (ref 8.9–10.3)
CO2: 29 mmol/L (ref 22–32)
Chloride: 98 mmol/L — ABNORMAL LOW (ref 101–111)
Creatinine, Ser: 1.9 mg/dL — ABNORMAL HIGH (ref 0.44–1.00)
GFR calc Af Amer: 28 mL/min — ABNORMAL LOW (ref >60)
GFR, EST NON AFRICAN AMERICAN: 24 mL/min — AB (ref >60)
GLUCOSE: 151 mg/dL — AB (ref 65–99)
Potassium: 4.7 mmol/L (ref 3.5–5.1)
Sodium: 137 mmol/L (ref 135–145)

## 2016-02-22 MED ORDER — LORAZEPAM 2 MG/ML IJ SOLN
0.5000 mg | Freq: Once | INTRAMUSCULAR | Status: AC
  Administered 2016-02-22: 0.5 mg via INTRAVENOUS
  Filled 2016-02-22: qty 1

## 2016-02-22 MED ORDER — LORAZEPAM 0.5 MG PO TABS
0.5000 mg | ORAL_TABLET | Freq: Four times a day (QID) | ORAL | Status: DC | PRN
  Administered 2016-02-22 (×2): 0.5 mg via ORAL
  Filled 2016-02-22 (×2): qty 1

## 2016-02-22 MED ORDER — FUROSEMIDE 40 MG PO TABS
40.0000 mg | ORAL_TABLET | Freq: Every day | ORAL | Status: DC
  Administered 2016-02-22 – 2016-02-23 (×2): 40 mg via ORAL
  Filled 2016-02-22 (×2): qty 1

## 2016-02-22 MED ORDER — LACTULOSE 10 GM/15ML PO SOLN
20.0000 g | Freq: Two times a day (BID) | ORAL | Status: DC | PRN
  Administered 2016-02-23: 20 g via ORAL
  Filled 2016-02-22: qty 30

## 2016-02-22 MED ORDER — POLYETHYLENE GLYCOL 3350 17 G PO PACK
17.0000 g | PACK | Freq: Every day | ORAL | Status: DC
  Administered 2016-02-23 – 2016-02-26 (×4): 17 g via ORAL
  Filled 2016-02-22 (×4): qty 1

## 2016-02-22 MED ORDER — ALUM & MAG HYDROXIDE-SIMETH 200-200-20 MG/5ML PO SUSP
15.0000 mL | Freq: Three times a day (TID) | ORAL | Status: AC
  Administered 2016-02-22 – 2016-02-23 (×4): 15 mL via ORAL
  Filled 2016-02-22 (×4): qty 30

## 2016-02-22 MED ORDER — IPRATROPIUM-ALBUTEROL 0.5-2.5 (3) MG/3ML IN SOLN
3.0000 mL | Freq: Four times a day (QID) | RESPIRATORY_TRACT | Status: DC
  Administered 2016-02-22 – 2016-02-24 (×7): 3 mL via RESPIRATORY_TRACT
  Filled 2016-02-22 (×8): qty 3

## 2016-02-22 MED ORDER — MORPHINE SULFATE (CONCENTRATE) 10 MG/0.5ML PO SOLN
5.0000 mg | ORAL | Status: DC | PRN
  Administered 2016-02-22 – 2016-02-23 (×5): 5 mg via ORAL
  Filled 2016-02-22 (×5): qty 1

## 2016-02-22 MED ORDER — TIOTROPIUM BROMIDE MONOHYDRATE 18 MCG IN CAPS
18.0000 ug | ORAL_CAPSULE | Freq: Every day | RESPIRATORY_TRACT | Status: DC
  Administered 2016-02-22 – 2016-02-26 (×5): 18 ug via RESPIRATORY_TRACT
  Filled 2016-02-22 (×2): qty 5

## 2016-02-22 MED ORDER — SENNOSIDES-DOCUSATE SODIUM 8.6-50 MG PO TABS
2.0000 | ORAL_TABLET | Freq: Two times a day (BID) | ORAL | Status: DC
  Administered 2016-02-22 – 2016-02-26 (×9): 2 via ORAL
  Filled 2016-02-22 (×9): qty 2

## 2016-02-22 NOTE — Progress Notes (Signed)
SATURATION QUALIFICATIONS: (This note is used to comply with regulatory documentation for home oxygen)  Patient Saturations on Room Air at Rest = 88%  Patient Saturations on Room Air while Ambulating =84 %  Patient Saturations on 2 Liters of oxygen while Ambulating =93 %  Please briefly explain why patient needs home oxygen:COPD

## 2016-02-22 NOTE — Progress Notes (Signed)
Patient oxygen levels remaining stable during rest.  Started Ativan, Roxonal, Maalox to help with increased labor of breathing and belching of patient.  Observed improvement with Roxonal & Ativan with breathing efforts.  If patient continues to improve, possible discharge tomorrow.  Has concentrator at home with [facility] for oxygen needs.

## 2016-02-22 NOTE — Progress Notes (Signed)
*  PRELIMINARY RESULTS* Echocardiogram 2D Echocardiogram has been performed.  Toni [name] 02/22/2016, 8:32 AM

## 2016-02-22 NOTE — Consult Note (Signed)
Patient noted to be improving Will sign off at this time

## 2016-02-22 NOTE — Care Management (Signed)
Spoke with Ms. Fambrough at the bedside. States she feels good. 3 liters oxygen per nasal cannula. Ususal on 2 liters per nasal cannula at night only. Discussed that if she needs continuous oxygen the physician would need to write this order and it would need to be faxed to [facility] Physical therapy evaluation completed recommends home with home health and therapy. Declines Home Health at this time. Shelbie Ammons RN MSN CCM Care Management [phone]

## 2016-02-22 NOTE — Progress Notes (Signed)
Physical Therapy Treatment Patient Details Name: Toni [name] MRN: [id number] DOB: 01-08-1935 Today's Date: 02/22/2016    History of Present Illness Toni [name] is a 81 y.o. female who presents with Progressive shortness of breath. Patient states that her breathing has been getting progressively worse over the past 2-3 days. She denies any fever or chills, significant increase in sputum production, states that she has had somewhat of a cough as well as some wheezing. She states that her home treatments were progressively less effective as well. She went and saw her pulmonologist this morning and was put on by mouth prednisone as well as by mouth Levaquin, but states that throughout the day her breathing discussing if worse. She came to the ED for evaluation and was found to be initially hypoxic, which corrected well with supple oxygen, she is on 4 L of O2 nasal cannula the time of this interview with good O2 saturation.    PT Comments    Pt is making good progress towards goals with safe technique with all mobility. Pt refusing HHPT, however is agreeable after today's session. Pt with limited endurance and needs O2 for all mobility. Safe technique with use of RW, however slight unsteadiness noted with turns.  Follow Up Recommendations  Home health PT     Equipment Recommendations       Recommendations for Other Services       Precautions / Restrictions Precautions Precautions: Fall Restrictions Weight Bearing Restrictions: No    Mobility  Bed Mobility Overal bed mobility: Independent             General bed mobility comments: safe technique performed; able to sit at EOB with no LOB noted  Transfers Overall transfer level: Needs assistance Equipment used: Rolling walker (2 wheeled) Transfers: Sit to/from Stand Sit to Stand: Supervision         General transfer comment: transfer performed with rw and supervision. Pt demonstrates upright posture. All mobility performed  on 2L of O2.  Ambulation/Gait Ambulation/Gait assistance: Min guard Ambulation Distance (Feet): 50 Feet Assistive device: Rolling walker (2 wheeled) Gait Pattern/deviations: Step-to pattern     General Gait Details: ambulated using rw and cga. Step to gait pattern performed with slow gait. No LOB noted, however distance limited secondary to SOB symptoms. Pt with sats WNL during ambulation with O2 sats at 91%.    Stairs            Wheelchair Mobility    Modified Rankin (Stroke Patients Only)       Balance                                    Cognition Arousal/Alertness: Awake/alert Behavior During Therapy: WFL for tasks assessed/performed Overall Cognitive Status: Within Functional Limits for tasks assessed                      Exercises Other Exercises Other Exercises: ambulated to bathroom with cga. Pt with slight unsteadiness with turns. Pt able to perform self hygiene with supervision.    General Comments        Pertinent Vitals/Pain Pain Assessment: No/denies pain    Home Living                      Prior Function            PT Goals (current goals can now be found in  the care plan section) Acute Rehab PT Goals Patient Stated Goal: to go home PT Goal Formulation: With patient Time For Goal Achievement: 03/05/16 Potential to Achieve Goals: Good Progress towards PT goals: Progressing toward goals    Frequency  Min 2X/week    PT Plan Current plan remains appropriate    Co-evaluation             End of Session Equipment Utilized During Treatment: Gait belt;Oxygen Activity Tolerance: Patient tolerated treatment well Patient left: in chair;with chair alarm set     Time: 5732-2567 PT Time Calculation (min) (ACUTE ONLY): 23 min  Charges:  $Gait Training: 8-22 mins $Therapeutic Activity: 8-22 mins                    G Codes:      Sidharth Leverette March 03, 2016, 4:38 PM  Greggory Stallion, PT, DPT [phone]

## 2016-02-22 NOTE — Progress Notes (Addendum)
[place] at [place] NAME: Toni [name]    MR#:  [id number]  DATE OF BIRTH:  29-Jun-1935  SUBJECTIVE:  CHIEF COMPLAINT:   Chief Complaint  Patient presents with  . Respiratory Distress   -Oxygen saturations are improving. Requiring only 1 L nasal cannula today. But still appears very dyspneic and wheezing on exam.  REVIEW OF SYSTEMS:  Review of Systems  Constitutional: Positive for malaise/fatigue. Negative for fever, chills and weight loss.  HENT: Negative for ear discharge, ear pain, hearing loss and nosebleeds.   Eyes: Negative for blurred vision, double vision and photophobia.  Respiratory: Positive for shortness of breath and wheezing. Negative for cough and hemoptysis.   Cardiovascular: Negative for chest pain, palpitations, orthopnea and leg swelling.  Gastrointestinal: Positive for heartburn and constipation. Negative for nausea, vomiting, abdominal pain, diarrhea and melena.  Genitourinary: Negative for dysuria, urgency and frequency.  Musculoskeletal: Negative for myalgias and neck pain.  Skin: Negative for rash.  Neurological: Negative for dizziness, tingling, sensory change, speech change, focal weakness and headaches.  Endo/Heme/Allergies: Does not bruise/bleed easily.  Psychiatric/Behavioral: Negative for depression.    DRUG ALLERGIES:   Allergies  Allergen Reactions  . Belladonna Alkaloids Anaphylaxis and Other (See Comments)    Pt states that this medication makes her eyes cross.      VITALS:  Blood pressure 122/44, pulse 67, temperature 98.3 F (36.8 C), temperature source Oral, resp. rate 24, height '5\' 2"'$  (1.575 m), weight 91.037 kg (200 lb 11.2 oz), SpO2 97 %.  PHYSICAL EXAMINATION:  Physical Exam  GENERAL:  81 y.o.-year-old patient Sitting in the bed , dyspneic-appearing.  EYES: Pupils equal, round, reactive to light and accommodation. No scleral icterus. Extraocular muscles intact.  HEENT: Head  atraumatic, normocephalic. Oropharynx and nasopharynx clear.  NECK:  Supple, no jugular venous distention. No thyroid enlargement, no tenderness.  LUNGS: Diffuse wheezing bilaterally, no rales,rhonchi or crepitation. No use of accessory muscles of respiration.  CARDIOVASCULAR: S1, S2 normal. No murmurs, rubs, or gallops.  ABDOMEN: Soft, nontender, nondistended. Bowel sounds present. No organomegaly or mass.  EXTREMITIES: No pedal edema, cyanosis, or clubbing.  NEUROLOGIC: Cranial nerves II through XII are intact. Muscle strength 5/5 in all extremities. Sensation intact. Gait not checked.  PSYCHIATRIC: The patient is alert and oriented x 3.  SKIN: No obvious rash, lesion, or ulcer.    LABORATORY PANEL:   CBC  Recent Labs Lab 02/17/16 0613  WBC 4.7  HGB 10.4*  HCT 31.9*  PLT 208   ------------------------------------------------------------------------------------------------------------------  Chemistries   Recent Labs Lab 02/18/16 0110  02/22/16 0505  NA  --   < > 137  K  --   < > 4.7  CL  --   < > 98*  CO2  --   < > 29  GLUCOSE  --   < > 151*  BUN  --   < > 86*  CREATININE  --   < > 1.90*  CALCIUM  --   < > 8.5*  MG 1.9  --   --   < > = values in this interval not displayed. ------------------------------------------------------------------------------------------------------------------  Cardiac Enzymes  Recent Labs Lab 02/16/16 1647  TROPONINI 0.03   ------------------------------------------------------------------------------------------------------------------  RADIOLOGY:  Dg Chest 2 View  02/21/2016  CLINICAL DATA:  Shortness of breath EXAM: CHEST  2 VIEW COMPARISON:  February 20, 2016 FINDINGS: Linear atelectasis or scar in the right mid lung. Cardiomegaly. Mild pulmonary venous congestion. No other abnormalities  or changes. IMPRESSION: Cardiomegaly with mild pulmonary venous congestion. Electronically Signed   By: Dorise Bullion III M.D   On: 02/21/2016 17:20    Dg Chest 2 View  02/20/2016  CLINICAL DATA:  Shortness of breath. EXAM: CHEST  2 VIEW COMPARISON:  Chest radiograph and chest CT Feb 16, 2016 FINDINGS: Patchy infiltrate in the right mid lung remains stable. There is no new opacity evident. Heart is mildly enlarged with pulmonary vascularity within normal limits. There is atherosclerotic calcification in the aorta. There is a focal hiatal hernia. No adenopathy is evident by radiography. There is extensive calcification in the mitral annulus. There is degenerative change in both shoulders. IMPRESSION: Persistent patchy infiltrate right mid lung. No new opacity. Hiatal hernia present. Mild cardiomegaly is stable. Electronically Signed   By: Lowella Grip III M.D.   On: 02/20/2016 18:36    EKG:   Orders placed or performed during the hospital encounter of 02/16/16  . EKG 12-Lead  . EKG 12-Lead  . EKG 12-Lead  . EKG 12-Lead  . EKG 12-Lead  . EKG 12-Lead    ASSESSMENT AND PLAN:   [name] a 81-year-old female with past medical history significant for COPD on nocturnal oxygen, hypertension, paroxysmal atrial flutter/fibrillation, CK D, diastolic congestive heart failure presents to hospital secondary to worsening shortness of breath.  #1 acute on chronic COPD exacerbation-currently on IV Solu-Medrol twice a day-continue that. Change DuoNeb's to scheduled today -Continue her brovana, Pulmicort and added Spiriva since nebs were only when necessary - Since saturations are improving, added low-dose Roxanol for her dyspnea. -Appreciate pulmonary consult. -On azithromycin. Chest x-ray with no infiltrates.  #2 chronic diastolic dysfunction-Echo is pending. -Restart Lasix as mild pulmonary vascular congestion on chest x-ray noted. Lasix was held due to acute renal failure.  #3 acute on chronic renal failure- Baseline creatinine seems to be around 1.2. -Worsened initially with Lasix. Stable around 1.9. - hold nephrotoxins, nephrology consult for  monitoring now that lasix is restarted  #4 constipation-medications adjusted  #5 GERD-on Protonix and also Maalox  #6 DVT prophylaxis-on Lovenox  #7 paroxysmal atrial fibrillation-patient is on flecainide.   Due to risk of falls- not on anticoagulation, Follows with Dr. Rockey Situ -Outpatient cardiology follow up.   All the records are reviewed and case discussed with Care Management/Social Workerr. Management plans discussed with the patient, family and they are in agreement.  CODE STATUS: Full code  TOTAL TIME TAKING CARE OF THIS PATIENT: 37 minutes.   POSSIBLE D/C IN 1-2 DAYS, DEPENDING ON CLINICAL CONDITION.   Gladstone Lighter M.D on 02/22/2016 at 11:14 AM  Between 7am to 6pm - Pager - [phone]  After 6pm go to [email or web address] - password [email or web address] [facility]  Office  [phone]  CC: Primary care physician; Pcp Not In System

## 2016-02-23 DIAGNOSIS — F4322 Adjustment disorder with anxiety: Secondary | ICD-10-CM

## 2016-02-23 DIAGNOSIS — R1013 Epigastric pain: Secondary | ICD-10-CM | POA: Insufficient documentation

## 2016-02-23 DIAGNOSIS — K219 Gastro-esophageal reflux disease without esophagitis: Secondary | ICD-10-CM

## 2016-02-23 LAB — BASIC METABOLIC PANEL
ANION GAP: 9 (ref 5–15)
BUN: 85 mg/dL — AB (ref 6–20)
CHLORIDE: 100 mmol/L — AB (ref 101–111)
CO2: 29 mmol/L (ref 22–32)
Calcium: 8.6 mg/dL — ABNORMAL LOW (ref 8.9–10.3)
Creatinine, Ser: 1.86 mg/dL — ABNORMAL HIGH (ref 0.44–1.00)
GFR calc Af Amer: 29 mL/min — ABNORMAL LOW (ref >60)
GFR, EST NON AFRICAN AMERICAN: 25 mL/min — AB (ref >60)
GLUCOSE: 130 mg/dL — AB (ref 65–99)
POTASSIUM: 4.7 mmol/L (ref 3.5–5.1)
Sodium: 138 mmol/L (ref 135–145)

## 2016-02-23 MED ORDER — FUROSEMIDE 10 MG/ML IJ SOLN
4.0000 mg/h | INTRAMUSCULAR | Status: DC
  Administered 2016-02-23 – 2016-02-24 (×2): 4 mg/h via INTRAVENOUS
  Filled 2016-02-23 (×3): qty 25

## 2016-02-23 MED ORDER — CLONAZEPAM 0.5 MG PO TABS
0.5000 mg | ORAL_TABLET | Freq: Two times a day (BID) | ORAL | Status: DC
  Administered 2016-02-23 – 2016-02-26 (×6): 0.5 mg via ORAL
  Filled 2016-02-23 (×6): qty 1

## 2016-02-23 MED ORDER — ALUM & MAG HYDROXIDE-SIMETH 200-200-20 MG/5ML PO SUSP: 30.0000 mL | Freq: Four times a day (QID) | ORAL | Status: DC | PRN

## 2016-02-23 MED ORDER — PREDNISONE 50 MG PO TABS
50.0000 mg | ORAL_TABLET | Freq: Every day | ORAL | Status: DC
  Administered 2016-02-23 – 2016-02-26 (×4): 50 mg via ORAL
  Filled 2016-02-23 (×4): qty 1

## 2016-02-23 MED ORDER — SENNOSIDES-DOCUSATE SODIUM 8.6-50 MG PO TABS: 2.0000 | ORAL_TABLET | Freq: Two times a day (BID) | ORAL | Status: DC

## 2016-02-23 MED ORDER — MORPHINE SULFATE (CONCENTRATE) 10 MG/0.5ML PO SOLN: 5.0000 mg | Freq: Four times a day (QID) | ORAL | Status: DC | PRN

## 2016-02-23 MED ORDER — LORAZEPAM 1 MG PO TABS
1.0000 mg | ORAL_TABLET | Freq: Three times a day (TID) | ORAL | Status: DC | PRN
  Administered 2016-02-23 – 2016-02-25 (×2): 1 mg via ORAL
  Filled 2016-02-23 (×2): qty 1

## 2016-02-23 MED ORDER — LORAZEPAM 1 MG PO TABS: 1.0000 mg | ORAL_TABLET | Freq: Three times a day (TID) | ORAL | Status: DC | PRN

## 2016-02-23 MED ORDER — TIOTROPIUM BROMIDE MONOHYDRATE 18 MCG IN CAPS: 18.0000 ug | ORAL_CAPSULE | Freq: Every day | RESPIRATORY_TRACT | Status: DC

## 2016-02-23 MED ORDER — LORAZEPAM 0.5 MG PO TABS: 0.5000 mg | ORAL_TABLET | Freq: Three times a day (TID) | ORAL | Status: DC | PRN

## 2016-02-23 NOTE — Progress Notes (Signed)
[facility] [place] NAME: Toni [name]    MR#:  [id number]  DATE OF BIRTH:  04-04-1935  SUBJECTIVE:  CHIEF COMPLAINT:   Chief Complaint  Patient presents with  . Respiratory Distress   - Breathing is improved, wheezing is much improved. Remains on 2 L oxygen. -Had a terrible night last night. Had severe anxiety requiring ativan, benadryl and roxanol with not much benefit. - complains of severe heartburn and constant belching which has worsened -Remains constipated  REVIEW OF SYSTEMS:  Review of Systems  Constitutional: Positive for malaise/fatigue. Negative for fever, chills and weight loss.  HENT: Negative for ear discharge, ear pain, hearing loss and nosebleeds.   Eyes: Negative for blurred vision, double vision and photophobia.  Respiratory: Positive for shortness of breath. Negative for cough, hemoptysis and wheezing.   Cardiovascular: Negative for chest pain, palpitations, orthopnea and leg swelling.  Gastrointestinal: Positive for heartburn and constipation. Negative for nausea, vomiting, abdominal pain, diarrhea and melena.  Genitourinary: Negative for dysuria, urgency and frequency.  Musculoskeletal: Negative for myalgias and neck pain.  Skin: Negative for rash.  Neurological: Negative for dizziness, tingling, sensory change, speech change, focal weakness and headaches.  Endo/Heme/Allergies: Does not bruise/bleed easily.  Psychiatric/Behavioral: Negative for depression. The patient is nervous/anxious.     DRUG ALLERGIES:   Allergies  Allergen Reactions  . Belladonna Alkaloids Anaphylaxis and Other (See Comments)    Pt states that this medication makes her eyes cross.      VITALS:  Blood pressure 141/69, pulse 66, temperature 98.6 F (37 C), temperature source Oral, resp. rate 18, height '5\' 2"'$  (1.575 m), weight 90.591 kg (199 lb 11.5 oz), SpO2 98 %.  PHYSICAL EXAMINATION:  Physical Exam  GENERAL:  81  y.o.-year-old patient Sitting in the bed , very anxious appearing.  EYES: Pupils equal, round, reactive to light and accommodation. No scleral icterus. Extraocular muscles intact.  HEENT: Head atraumatic, normocephalic. Oropharynx and nasopharynx clear.  NECK:  Supple, no jugular venous distention. No thyroid enlargement, no tenderness.  LUNGS: Improved wheezing bilaterally, no rales,rhonchi or crepitation. No use of accessory muscles of respiration.  CARDIOVASCULAR: S1, S2 normal. No murmurs, rubs, or gallops.  ABDOMEN: Soft, nontender, nondistended. Bowel sounds present. No organomegaly or mass.  EXTREMITIES: No pedal edema, cyanosis, or clubbing.  NEUROLOGIC: Cranial nerves II through XII are intact. Muscle strength 5/5 in all extremities. Sensation intact. Gait not checked.  PSYCHIATRIC: The patient is alert and oriented x 3. Very anxious appearing SKIN: No obvious rash, lesion, or ulcer.    LABORATORY PANEL:   CBC  Recent Labs Lab 02/17/16 0613  WBC 4.7  HGB 10.4*  HCT 31.9*  PLT 208   ------------------------------------------------------------------------------------------------------------------  Chemistries   Recent Labs Lab 02/18/16 0110  02/23/16 0512  NA  --   < > 138  K  --   < > 4.7  CL  --   < > 100*  CO2  --   < > 29  GLUCOSE  --   < > 130*  BUN  --   < > 85*  CREATININE  --   < > 1.86*  CALCIUM  --   < > 8.6*  MG 1.9  --   --   < > = values in this interval not displayed. ------------------------------------------------------------------------------------------------------------------  Cardiac Enzymes  Recent Labs Lab 02/16/16 1647  TROPONINI 0.03   ------------------------------------------------------------------------------------------------------------------  RADIOLOGY:  Dg Chest 2 View  02/21/2016  CLINICAL DATA:  Shortness of breath EXAM: CHEST  2 VIEW COMPARISON:  February 20, 2016 FINDINGS: Linear atelectasis or scar in the right mid lung.  Cardiomegaly. Mild pulmonary venous congestion. No other abnormalities or changes. IMPRESSION: Cardiomegaly with mild pulmonary venous congestion. Electronically Signed   By: Dorise Bullion III M.D   On: 02/21/2016 17:20    EKG:   Orders placed or performed during the hospital encounter of 02/16/16  . EKG 12-Lead  . EKG 12-Lead  . EKG 12-Lead  . EKG 12-Lead  . EKG 12-Lead  . EKG 12-Lead    ASSESSMENT AND PLAN:   Toni [name] a 81-year-old female with past medical history significant for COPD on nocturnal oxygen, hypertension, paroxysmal atrial flutter/fibrillation, CK D, diastolic congestive heart failure presents to hospital secondary to worsening shortness of breath.  #1 acute on chronic COPD exacerbation- Improving, change to oral prednisone today. - DuoNeb's scheduled  -Continue her brovana, Pulmicort and added Spiriva since nebs were only when necessary - Since saturations are improving, added low-dose Roxanol for her dyspnea. -Appreciate pulmonary consult. -finished azithromycin. Chest x-ray with no infiltrates.  #2 chronic diastolic dysfunction-Echo with EF 60%, severe pulmonary hypertension noted.. -Restarted Lasix as mild pulmonary vascular congestion on chest x-ray noted. - Lasix was held initially due to acute renal failure.  #3 acute on chronic renal failure- Baseline creatinine seems to be around 1.2. -Worsened initially with Lasix. Stable around 1.8. - hold nephrotoxins, nephrology consulted for monitoring now that lasix is restarted  #4 Severe anxiety- ativan dose increased, per son patient still sneaks and smokes- but per patient- quit smoking several years ago Psych consult Already on trazadone  #5 GERD- severe heartburn and constant belching- GI consulted - on Protonix and also Maalox with minimal relief only  #6 DVT prophylaxis-on Lovenox  #7 paroxysmal atrial fibrillation-patient is on flecainide.   Due to risk of falls- not on anticoagulation, Follows with Dr.  Rockey Situ -Outpatient cardiology follow up. ECHO normal   All the records are reviewed and case discussed with Care Management/Social Workerr. Management plans discussed with the patient, family and they are in agreement.  CODE STATUS: Full code  TOTAL TIME TAKING CARE OF THIS PATIENT: 38 minutes.   POSSIBLE D/C IN 1-2 DAYS, DEPENDING ON CLINICAL CONDITION.   Gladstone Lighter M.D on 02/23/2016 at 7:41 AM  Between 7am to 6pm - Pager - [phone]  After 6pm go to [email or web address] - password [email or web address] [facility]  Office  [phone]  CC: Primary care physician; Pcp Not In System

## 2016-02-23 NOTE — Progress Notes (Signed)
Admitted from home with husband  Diagnosis: COPD exacerbation   HX: a-fib, CKD  A/o   Anxiety- Psych consulted, Clonazepam BID added in addition to Ativan PRN  Complaints of SOB - roxanol & Antianxiety meds  Excessive indigestion & belching - GI consulted, continue PPI, f/u Otpt  Maalox, pantoprazole given, diet recommendations  Constipation - lactulose given, miralax, senna-s without result  sats 97-100 on 2L O2  Only used O2 at nighty at home, Will be discharged with continuous O2   teley d/c  methyprednisone changed to prednisone taper  Nephrology consulted- Lasix drip started d/t fluid overload. Up to the bedside commode with assist '

## 2016-02-23 NOTE — BH Assessment (Signed)
Writer informed psychiatrist (Dr. Weber Cooks) of consult.

## 2016-02-23 NOTE — Consult Note (Signed)
[facility] Consult   Reason for Consult:  Consult for 81 year old woman without past psychiatric history currently with symptoms of anxiety Referring Physician:  Tressia Miners Patient Identification: Toni [name] MRN:  [id number] Principal Diagnosis: Adjustment disorder with anxiety Diagnosis:   Patient Active Problem List   Diagnosis Date Noted  . Adjustment disorder with anxiety [F43.22] 02/23/2016  . Dyspepsia [R10.13]   . Esophageal reflux [K21.9]   . Encounter for anticoagulation discussion and counseling [Z71.89] 11/17/2015  . Fall [W19.XXXA] 11/17/2015  . Essential hypertension, benign [I10]   . Lung infiltrate on CT [R91.8] 06/08/2015  . Chronic diastolic CHF (congestive heart failure) ([place]) [I50.32] 05/17/2015  . Insomnia [G47.00] 04/17/2014  . Squamous cell lung cancer ([place]) [C34.90] 03/27/2014  . Postnasal drip [R09.82] 03/05/2014  . Hyperlipidemia [E78.5] 12/04/2013  . Leg edema [R60.0] 12/04/2013  . Atypical atrial flutter ([place]) [I48.4] 11/16/2013  . COPD (chronic obstructive pulmonary disease) ([place]) [J44.9] 11/15/2013  . Chronic kidney disease [N18.9] 08/24/2013  . Essential (primary) hypertension [I10] 08/24/2013  . Adult hypothyroidism [E03.9] 08/24/2013  . Acute exacerbation of chronic obstructive airways disease ([place]) [J44.1] 08/24/2013  . Chronic obstructive pulmonary disease with acute exacerbation (H[facility]) [J44.1] 08/24/2013    Total Time spent with patient: 1 hour  Subjective:   [name] is a 81 y.o. female patient admitted with "I'm having some trouble catching my breath".  HPI:  Patient interviewed. Chart reviewed. 81 year old woman with a history of COPD and shortness of breath who is also complaining of frequent burping and GI discomfort. She has had some symptoms of anxiety. Patient is aware that she is occasionally getting symptoms of anxiety mostly with physical symptoms of feeling more short of breath which makes her more panicky.  She's had some trouble sleeping for the last few days. She does not report feeling depressed. Does not report any suicidal thoughts. Does not report any hallucinations or psychotic symptoms. She has been given when necessary doses of Ativan which have helped her to feel better. Patient is not reporting any distress other than her medical illness. Overall positive and looking forward to going home.  Medical history: Patient has past history of lung cancer, history of chronic COPD, atrial flutter hypertension esophageal reflux. Multiple medical problems.  Social history: Lives with her husband. 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 He enjoys her work. Has 3 adult sons. Good relationship with the whole family.  Substance abuse history: Does not drink. Has never had a drinking problem. Denies any substance abuse. No evidence of problems with abuse of prescription medicine.  Past Psychiatric History: Patient has not been treated for mental health or psychiatric problems in the past. Does not think of herself as having a chronic anxiety disorder. Never been in a psychiatric hospital no history of suicidality no history of psychosis. She does report that when she's been given corticosteroids for her lung problems in the past they have made her feel jittery.  Risk to Self: Is patient at risk for suicide?: No Risk to Others:   Prior Inpatient Therapy:   Prior Outpatient Therapy:    Past Medical History:  Past Medical History  Diagnosis Date  . Cataract   . COPD (chronic obstructive pulmonary disease) ([place])   . Essential hypertension, benign   . Hypothyroidism   . Atypical atrial flutter ([place]) 11/16/2013    a. CHA2DS2VASc = 5-->prev on xarelto, d/c'd  due to bleeding skin tear;  c. 11/2013->Placed on flecainide.  . Chronic diastolic heart failure ([place])   . PAF (paroxysmal atrial fibrillation) ([place])      a. 11/2013 Echo: EF 55-60%, mild AS/MR, mod dil LA/RA, PASP 11mHg;  b. CHA2DS2VASc = 5-->prev on xarelto, d/c'd due to bleeding skin tear;  c. 11/2013->Placed on flecainide.  . Lung nodule     Followed by Dr. FRaul Del . 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 [place] cancer [place]   . Collagen vascular disease ([facility]   . Hypokalemia   . Chronic diastolic CHF (congestive heart failure) ([place]     a. 11/2013 Echo: EF 55-60%, mild AS/MR, mod dil LA/RA, PASP 333mg.    Past Surgical History  Procedure Laterality Date  . Eye surgery    . Total knee arthroplasty Bilateral    Family History:  Family History  Problem Relation Age of Onset  . CAD Father   . Heart disease Father   . Heart attack Father   . Arrhythmia Sister    Family Psychiatric  History: Sounds she had a sister with some kind of mental health problems but otherwise no family history identified Social History:  History  Alcohol Use No     History  Drug Use No    Social History   Social History  . Marital Status: Married    Spouse Name: N/A  . Number of Children: N/A  . Years of Education: N/A   Social History Main Topics  . Smoking status: Former Smoker -- 1.50 packs/day for 50 years    Types: Cigarettes    Quit date: 09/18/2002  . Smokeless tobacco: Never Used  . Alcohol Use: No  . Drug Use: No  . Sexual Activity: Not Asked   Other Topics Concern  . None   Social History Narrative   Additional Social History:    Allergies:   Allergies  Allergen Reactions  . Belladonna Alkaloids Anaphylaxis and Other (See Comments)    Pt states that this medication makes her eyes cross.      Labs:  Results for orders placed or performed during the hospital encounter of 02/16/16 (from the past 48 hour(s))  Basic metabolic panel     Status: Abnormal   Collection Time: 02/22/16  5:05 AM  Result Value Ref Range   Sodium 137 135 - 145 mmol/L   Potassium 4.7 3.5 - 5.1 mmol/L   Chloride 98 (L)  101 - 111 mmol/L   CO2 29 22 - 32 mmol/L   Glucose, Bld 151 (H) 65 - 99 mg/dL   BUN 86 (H) 6 - 20 mg/dL   Creatinine, Ser 1.90 (H) 0.44 - 1.00 mg/dL   Calcium 8.5 (L) 8.9 - 10.3 mg/dL   GFR calc non Af Amer 24 (L) >60 mL/min   GFR calc Af Amer 28 (L) >60 mL/min    Comment: (NOTE) The eGFR has been calculated using the CKD EPI equation. This calculation has not been validated in all clinical situations. eGFR's persistently <60 mL/min signify possible Chronic Kidney Disease.    Anion gap 10 5 - 15  Basic metabolic panel     Status: Abnormal   Collection Time: 02/23/16  5:12 AM  Result Value Ref Range   Sodium 138 135 - 145 mmol/L   Potassium 4.7 3.5 - 5.1 mmol/L   Chloride 100 (L) 101 - 111 mmol/L   CO2 29 22 - 32 mmol/L   Glucose, Bld  130 (H) 65 - 99 mg/dL   BUN 85 (H) 6 - 20 mg/dL   Creatinine, Ser 1.86 (H) 0.44 - 1.00 mg/dL   Calcium 8.6 (L) 8.9 - 10.3 mg/dL   GFR calc non Af Amer 25 (L) >60 mL/min   GFR calc Af Amer 29 (L) >60 mL/min    Comment: (NOTE) The eGFR has been calculated using the CKD EPI equation. This calculation has not been validated in all clinical situations. eGFR's persistently <60 mL/min signify possible Chronic Kidney Disease.    Anion gap 9 5 - 15    Current Facility-Administered Medications  Medication Dose Route Frequency Provider Last Rate Last Dose  . acetaminophen (TYLENOL) tablet 650 mg  650 mg Oral Q6H PRN Lance Coon, MD   650 mg at 02/22/16 0434   Or  . acetaminophen (TYLENOL) suppository 650 mg  650 mg Rectal Q6H PRN Lance Coon, MD      . alum & mag hydroxide-simeth (MAALOX/MYLANTA) 200-200-20 MG/5ML suspension 15 mL  15 mL Oral TID Gladstone Lighter, MD   15 mL at 02/23/16 0807  . arformoterol (BROVANA) nebulizer solution 15 mcg  15 mcg Nebulization Q12H Lance Coon, MD   15 mcg at 02/23/16 0744  . budesonide (PULMICORT) nebulizer solution 0.5 mg  0.5 mg Nebulization BID Lance Coon, MD   0.5 mg at 02/23/16 0744  . clonazePAM  (KLONOPIN) tablet 0.5 mg  0.5 mg Oral BID Gonzella Lex, MD      . diltiazem (CARDIZEM CD) 24 hr capsule 300 mg  300 mg Oral Daily Lance Coon, MD   300 mg at 02/23/16 5027  . diphenhydrAMINE (BENADRYL) capsule 50 mg  50 mg Oral QHS PRN Lance Coon, MD   50 mg at 02/23/16 0217  . enoxaparin (LOVENOX) injection 30 mg  30 mg Subcutaneous Q24H Hillary Bow, MD   30 mg at 02/22/16 1729  . flecainide (TAMBOCOR) tablet 100 mg  100 mg Oral BID Lance Coon, MD   100 mg at 02/23/16 7412  . furosemide (LASIX) tablet 40 mg  40 mg Oral Daily Gladstone Lighter, MD   40 mg at 02/23/16 0808  . ipratropium-albuterol (DUONEB) 0.5-2.5 (3) MG/3ML nebulizer solution 3 mL  3 mL Nebulization Q6H Gladstone Lighter, MD   3 mL at 02/23/16 0744  . lactulose (CHRONULAC) 10 GM/15ML solution 20 g  20 g Oral BID PRN Gladstone Lighter, MD   20 g at 02/23/16 0807  . levothyroxine (SYNTHROID, LEVOTHROID) tablet 75 mcg  75 mcg Oral QAC breakfast Lance Coon, MD   75 mcg at 02/23/16 8786  . LORazepam (ATIVAN) tablet 1 mg  1 mg Oral Q8H PRN Gladstone Lighter, MD   1 mg at 02/23/16 0807  . morphine CONCENTRATE 10 MG/0.5ML oral solution 5 mg  5 mg Oral Q4H PRN Gladstone Lighter, MD   5 mg at 02/23/16 0814  . ondansetron (ZOFRAN) tablet 4 mg  4 mg Oral Q6H PRN Lance Coon, MD   4 mg at 02/16/16 2233   Or  . ondansetron [facility]) injection 4 mg  4 mg Intravenous Q6H PRN Lance Coon, MD   4 mg at 02/23/16 7672  . pantoprazole (PROTONIX) EC tablet 40 mg  40 mg Oral BID AC Srikar Sudini, MD   40 mg at 02/23/16 0947  . polyethylene glycol (MIRALAX / GLYCOLAX) packet 17 g  17 g Oral Daily Gladstone Lighter, MD   Stopped at 02/23/16 [phone]  . predniSONE (DELTASONE) tablet 50 mg  50 mg Oral  Q breakfast Gladstone Lighter, MD   50 mg at 02/23/16 0807  . senna-docusate (Senokot-S) tablet 2 tablet  2 tablet Oral BID Gladstone Lighter, MD   Stopped at 02/23/16 [phone]  . sodium chloride flush (NS) 0.9 % injection 3 mL  3 mL Intravenous Q12H Lance Coon, MD   3 mL at 02/23/16 0810  . tiotropium (SPIRIVA) inhalation capsule 18 mcg  18 mcg Inhalation Daily Gladstone Lighter, MD   18 mcg at 02/23/16 0813  . traZODone (DESYREL) tablet 50 mg  50 mg Oral QHS Loletha Grayer, MD   50 mg at 02/22/16 2214    Musculoskeletal: Strength & Muscle Tone: decreased Gait & Station: unable to stand Patient leans: N/A  Psychiatric Specialty Exam: Physical Exam  Nursing note and vitals reviewed. Constitutional: She appears well-developed and well-nourished. She is cooperative. She has a sickly appearance.  HENT:  Head: Normocephalic and atraumatic.  Eyes: Conjunctivae are normal. Pupils are equal, round, and reactive to light.  Neck: Normal range of motion.  Cardiovascular: Normal heart sounds.   Respiratory: She is in respiratory distress.  GI: Soft.  Musculoskeletal: Normal range of motion.  Neurological: She is alert.  Skin: Skin is warm and dry.  Psychiatric: Her speech is normal and behavior is normal. Judgment and thought content normal. Her mood appears anxious. She exhibits abnormal recent memory.    Review of Systems  Constitutional: Negative.   HENT: Negative.   Eyes: Negative.   Respiratory: Positive for shortness of breath.   Cardiovascular: Negative.   Gastrointestinal: Positive for heartburn and nausea.  Musculoskeletal: Negative.   Skin: Negative.   Neurological: Negative.   Psychiatric/Behavioral: Positive for memory loss. Negative for depression, suicidal ideas, hallucinations and substance abuse. The patient is nervous/anxious and has insomnia.     Blood pressure 148/74, pulse 80, temperature 98.5 F (36.9 C), temperature source Oral, resp. rate 20, height 5' 2"  (1.575 m), weight 90.591 kg (199 lb 11.5 oz), SpO2 97 %.Body mass index is 36.52 kg/(m^2).  General Appearance: Casual  Eye Contact:  Good  Speech:  Slow  Volume:  Decreased  Mood:  Anxious  Affect:  Congruent  Thought Process:  Goal Directed  Orientation:   Full (Time, Place, and Person)  Thought Content:  Logical  Suicidal Thoughts:  No  Homicidal Thoughts:  No  Memory:  Immediate;   Good Recent;   Poor Remote;   Fair  Judgement:  Fair  Insight:  Fair  Psychomotor Activity:  Decreased  Concentration:  Concentration: Fair  Recall:  [employer] of Knowledge:  Fair  Language:  Fair  Akathisia:  No  Handed:  Right  AIMS (if indicated):     Assets:  Desire for Improvement Financial Resources/Insurance Housing Resilience Social Support  ADL's:  Impaired  Cognition:  Impaired,  Mild  Sleep:        Treatment Plan Summary: Medication management and Plan 81 year old woman without any past psychiatric history. She is complaining of anxiety that is primarily related to her physical symptoms and is manifesting as physical complaints. I also note on exam that she has some short-term memory problems and probably has at least a small degree of dementia although it sounds like at baseline she is functioning quite well. Patient is unlikely to need long-term management for psychiatric problems but in the hospital probably better to go ahead and manage the anxiety to help her breathe better. Some of this may be steroid-induced as well as. Instead of using when necessary  medicine I recommend standing doses of clonazepam 0.5 mg twice a day. I put in an order for that. When necessary doses of Ativan can remain if the standing order is not enough. I would hope that at the time of discharge she can be tapered off of benzodiazepines or at most be given a small amount with an emphasis on using it as needed. I would not start her on any antidepressants. Explained the plan to the patient and and reassured her that things should get better. I will follow-up as needed.  Disposition: Patient does not meet criteria for psychiatric inpatient admission. Supportive therapy provided about ongoing stressors.  Alethia Berthold, MD 02/23/2016 12:26 PM

## 2016-02-23 NOTE — Consult Note (Signed)
[facility]  [address] [place], [place] [address] Phone: [phone] Fax : [phone]  Consultation  Referring Provider:     No ref. provider found Primary Care Physician:  [place] Not In System Primary Gastroenterologist:  None         Reason for Consultation:     GERD with dyspepsia  Date of Admission:  02/16/2016 Date of Consultation:  02/23/2016         HPI:   Toni [name] is a 81 y.o. female who comes in today with a long history of having excessive burping. The patient also states that she has had significant heartburn. The patient reports that she does not have much burping in the morning or if she had the acid reflux but it happens around lunchtime. The patient has a chocolate Hulan Fess her table at the present time and reports that she drinks multiple sodas throughout the day. She states that after eating she has a lot of burping with acid reflux all the way up to her throat. There is no report of any dysphagia or unexplained weight loss. I'm now being consult at for this patient who has had chronic reflux symptoms. The patient has never had an upper endoscopy to evaluate this. She is now admitted for COPD exacerbation.  Past Medical History  Diagnosis Date  . Cataract   . COPD (chronic obstructive pulmonary disease) ([place])   . Essential hypertension, benign   . Hypothyroidism   . Atypical atrial flutter ([place]) 11/16/2013    a. CHA2DS2VASc = 5-->prev on xarelto, d/c'd due to bleeding skin tear;  c. 11/2013->Placed on flecainide.  . Chronic diastolic heart failure ([place])   . PAF (paroxysmal atrial fibrillation) ([place])     a. 11/2013 Echo: EF 55-60%, mild AS/MR, mod dil LA/RA, PASP 92mHg;  b. CHA2DS2VASc = 5-->prev on xarelto, d/c'd due to bleeding skin tear;  c. 11/2013->Placed on flecainide.  . Lung nodule     Followed by Dr. FRaul Del . 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 Lung cancer ([place]   . Collagen vascular disease ([place]   .  Hypokalemia   . Chronic diastolic CHF (congestive heart failure) ([place]     a. 11/2013 Echo: EF 55-60%, mild AS/MR, mod dil LA/RA, PASP 362mg.    Past Surgical History  Procedure Laterality Date  . Eye surgery    . Total knee arthroplasty Bilateral     Prior to Admission medications   Medication Sig Start Date End Date Taking? Authorizing Provider  albuterol (PROVENTIL HFA;VENTOLIN HFA) 108 (90 Base) MCG/ACT inhaler Inhale 2 puffs into the lungs every 4 (four) hours as needed for wheezing or shortness of breath. 02/16/16  Yes DaWilhelmina McardleMD  budesonide (PULMICORT) 0.5 MG/2ML nebulizer solution Take 2 mLs (0.5 mg total) by nebulization 2 (two) times daily. 06/08/15  Yes KuFlora LippsMD  diltiazem (CARDIZEM CD) 300 MG 24 hr capsule Take 300 mg by mouth daily.   Yes Historical Provider, MD  flecainide (TAMBOCOR) 100 MG tablet Take 1 tablet (100 mg total) by mouth 2 (two) times daily. 11/24/13  Yes ElFrazier RichardsMD  formoterol (PERFOROMIST) 20 MCG/2ML nebulizer solution Take 2 mLs (20 mcg total) by nebulization 2 (two) times daily. 06/08/15  Yes KuFlora LippsMD  furosemide (LASIX) 40 MG tablet Take 40 mg by mouth daily.   Yes Historical Provider, MD  ipratropium-albuterol (DUONEB) 0.5-2.5 (3) MG/3ML SOLN Take 3 mLs by nebulization every 4 (  four) hours as needed (for wheezing/shortness of breath).   Yes Historical Provider, MD  levofloxacin (LEVAQUIN) 250 MG tablet Take 1 tablet (250 mg total) by mouth daily. 02/16/16  Yes Wilhelmina Mcardle, MD  levothyroxine (SYNTHROID, LEVOTHROID) 75 MCG tablet Take 75 mcg by mouth daily before breakfast.   Yes Historical Provider, MD  Melatonin 3 MG TABS Take 3 mg by mouth at bedtime as needed (for sleep).   Yes Historical Provider, MD  naproxen (NAPROSYN) 500 MG tablet Take 500 mg by mouth 2 (two) times daily as needed for mild pain.   Yes Historical Provider, MD  omega-3 acid ethyl esters (LOVAZA) 1 g capsule Take 1 g by mouth daily.   Yes Historical Provider,  MD  omeprazole (PRILOSEC) 20 MG capsule Take 20 mg by mouth 2 (two) times daily before a meal.   Yes Historical Provider, MD  pantoprazole (PROTONIX) 40 MG tablet Take 1 tablet (40 mg total) by mouth daily. 12/21/15 12/20/16 Yes Earleen Newport, MD  potassium chloride (K-DUR,KLOR-CON) 10 MEQ tablet Take 20 mEq by mouth daily.   Yes Historical Provider, MD  predniSONE (DELTASONE) 20 MG tablet Take 20-60 mg by mouth daily with breakfast. Pt is to take as a taper:  3 tablets for three days, 2 tablets for three days, one tablet for three days, then STOP. 02/16/16 02/25/16 Yes Historical Provider, MD  alum & mag hydroxide-simeth (MAALOX/MYLANTA) 200-200-20 MG/5ML suspension Take 30 mLs by mouth every 6 (six) hours as needed for indigestion or heartburn. 02/23/16   Gladstone Lighter, MD  LORazepam (ATIVAN) 1 MG tablet Take 1 tablet (1 mg total) by mouth every 8 (eight) hours as needed for anxiety. 02/23/16   Gladstone Lighter, MD  Morphine Sulfate (MORPHINE CONCENTRATE) 10 MG/0.5ML SOLN concentrated solution Take 0.25 mLs (5 mg total) by mouth every 6 (six) hours as needed for shortness of breath. 02/23/16   Gladstone Lighter, MD  senna-docusate (SENOKOT-S) 8.6-50 MG tablet Take 2 tablets by mouth 2 (two) times daily. Hold if >2 bowel movements/day 02/23/16   Gladstone Lighter, MD  tiotropium (SPIRIVA) 18 MCG inhalation capsule Place 1 capsule (18 mcg total) into inhaler and inhale daily. 02/23/16   Gladstone Lighter, MD    Family History  Problem Relation Age of Onset  . CAD Father   . Heart disease Father   . Heart attack Father   . Arrhythmia Sister      Social History  Substance Use Topics  . Smoking status: Former Smoker -- 1.50 packs/day for 50 years    Types: Cigarettes    Quit date: 09/18/2002  . Smokeless tobacco: Never Used  . Alcohol Use: No    Allergies as of 02/16/2016 - Review Complete 02/16/2016  Allergen Reaction Noted  . Belladonna alkaloids Anaphylaxis and Other (See Comments)  12/10/2013    Review of Systems:    All systems reviewed and negative except where noted in HPI.   Physical Exam:  Vital signs in last 24 hours: Temp:  [98 F (36.7 C)-98.6 F (37 C)] 98.5 F (36.9 C) (06/07 0810) Pulse Rate:  [66-80] 80 (06/07 0810) Resp:  [16-22] 20 (06/07 0810) BP: (113-160)/(56-74) 148/74 mmHg (06/07 0810) SpO2:  [96 %-100 %] 97 % (06/07 0810) FiO2 (%):  [28 %] 28 % (06/07 0744) Weight:  [199 lb 11.5 oz (90.591 kg)] 199 lb 11.5 oz (90.591 kg) (06/07 0500) Last BM Date: 02/21/16 General:   Pleasant, cooperative in NAD Head:  Normocephalic and atraumatic. Eyes:   No  icterus.   Conjunctiva pink. PERRLA. Ears:  Normal auditory acuity. Neck:  Supple; no masses or thyroidomegaly Lungs: Slight respiratory distress with decreased breath sounds diffusely. Heart:  Regular rate and rhythm;  Without murmur, clicks, rubs or gallops Abdomen:  Soft, nondistended, nontender. Normal bowel sounds. No appreciable masses or hepatomegaly.  No rebound or guarding.  Rectal:  Not performed. Msk:  Symmetrical without gross deformities.  Extremities:  Without edema, cyanosis or clubbing. Neurologic:  Alert and oriented x3;  grossly normal neurologically. Skin:  Intact without significant lesions or rashes. Cervical Nodes:  No significant cervical adenopathy. Psych:  Alert and cooperative. Normal affect.  LAB RESULTS: No results for input(s): WBC, HGB, HCT, PLT in the last 72 hours. BMET  Recent Labs  02/22/16 0505 02/23/16 0512  NA 137 138  K 4.7 4.7  CL 98* 100*  CO2 29 29  GLUCOSE 151* 130*  BUN 86* 85*  CREATININE 1.90* 1.86*  CALCIUM 8.5* 8.6*   LFT No results for input(s): PROT, ALBUMIN, AST, ALT, ALKPHOS, BILITOT, BILIDIR, IBILI in the last 72 hours. PT/INR No results for input(s): LABPROT, INR in the last 72 hours.  STUDIES: Dg Chest 2 View  02/21/2016  CLINICAL DATA:  Shortness of breath EXAM: CHEST  2 VIEW COMPARISON:  February 20, 2016 FINDINGS: Linear  atelectasis or scar in the right mid lung. Cardiomegaly. Mild pulmonary venous congestion. No other abnormalities or changes. IMPRESSION: Cardiomegaly with mild pulmonary venous congestion. Electronically Signed   By: Dorise Bullion III M.D   On: 02/21/2016 17:20      Impression / Plan:   Toni [name] is a 81 y.o. y/o female with Admission for COPD. The patient is actively coughing and burping. The patient also reports that she has significant heartburn. The patient has been told to avoid the chocolate that she has been eating and has on her table at the time of this interview. She has also been told to cut down on her caffeine and soda intake. The patient should be on a PPI. She has been told that acid reflux damage can take 6-8 weeks to heal. There is nothing to do acutely during this admission with this patient who is short of breath and has COPD. The patient has been given my card and has been told to follow-up as an outpatient. I will sign off at this time if any questions please do not hesitate to call.   Thank you for involving me in the care of this patient.      LOS: 7 days   Lucilla Lame, MD  02/23/2016, 12:10 PM   Note: This dictation was prepared with Dragon dictation along with smaller phrase technology. Any transcriptional errors that result from this process are unintentional.

## 2016-02-23 NOTE — Progress Notes (Signed)
Patient complaining of soreness and burning in throat.  No bowel movement with efforts of Senna S, gave Lactulose, pending results.  Reports having difficult night with panic attack feeling that she could not get her breathe and has excessive reflux and belching.

## 2016-02-23 NOTE — Consult Note (Signed)
Date: 02/23/2016                  Patient Name:  Toni [name]  MRN: [id number]  DOB: 1935-02-21  Age / Sex: 81 y.o., female         PCP: Pcp Not In System                 Service Requesting Consult: Internal medicine                 Reason for Consult: Chronic kidney disease            History of Present Illness: Patient is a 81 y.o. female with medical problems of COPD requiring home oxygen at night, hypothyroidism, hypertension, atrial fibrillation and flutter, chronic diastolic heart failure, lung nodule, history of lung cancer treated with radiation, who was admitted to [facility] on 02/16/2016 for evaluation of shortness of breath.  Patient presented to the emergency room for progressive worsening of shortness of breath over the past several days.  No fevers or chills.  She does report increase in productive cough.  She was placed on prednisone and Levaquin as outpatient but as her symptoms worsened, she came to the hospital for further evaluation Today she complains of continued shortness of breath, worsening fluid overload and constipation He is currently being treated with azithromycin, beauties a night nebulizer, diltiazem, flecainide, Lasix 40 mg every morning, duo nebs, as well as prednisone and Spiriva inhaler Workup so far has included a echocardiogram which showed EF to be 60-65%, moderate aortic stenosis, moderate to severe tricuspid regurgitation and severe pulmonary hypertension Patient's baseline creatinine appears to be 1.12 gfr of 46 from April 2017 Her Cr levels have increased during this hospitalization.  Today's level is 1.86, BUN of 85 and GFR 25      Medications: Outpatient medications: Prescriptions prior to admission  Medication Sig Dispense Refill Last Dose  . albuterol (PROVENTIL HFA;VENTOLIN HFA) 108 (90 Base) MCG/ACT inhaler Inhale 2 puffs into the lungs every 4 (four) hours as needed for wheezing or shortness of breath. 1 Inhaler 12 02/16/2016 at Unknown time   . budesonide (PULMICORT) 0.5 MG/2ML nebulizer solution Take 2 mLs (0.5 mg total) by nebulization 2 (two) times daily. 120 mL 11 02/16/2016 at Unknown time  . diltiazem (CARDIZEM CD) 300 MG 24 hr capsule Take 300 mg by mouth daily.   02/16/2016 at Unknown time  . flecainide (TAMBOCOR) 100 MG tablet Take 1 tablet (100 mg total) by mouth 2 (two) times daily. 60 tablet 11 02/16/2016 at Unknown time  . formoterol (PERFOROMIST) 20 MCG/2ML nebulizer solution Take 2 mLs (20 mcg total) by nebulization 2 (two) times daily. 2 mL 12 02/15/2016 at Unknown time  . furosemide (LASIX) 40 MG tablet Take 40 mg by mouth daily.   02/16/2016 at Unknown time  . ipratropium-albuterol (DUONEB) 0.5-2.5 (3) MG/3ML SOLN Take 3 mLs by nebulization every 4 (four) hours as needed (for wheezing/shortness of breath).   02/16/2016 at Unknown time  . levofloxacin (LEVAQUIN) 250 MG tablet Take 1 tablet (250 mg total) by mouth daily. 7 tablet 0 02/16/2016 at Unknown time  . levothyroxine (SYNTHROID, LEVOTHROID) 75 MCG tablet Take 75 mcg by mouth daily before breakfast.   02/16/2016 at Unknown time  . Melatonin 3 MG TABS Take 3 mg by mouth at bedtime as needed (for sleep).   02/15/2016 at Unknown time  . naproxen (NAPROSYN) 500 MG tablet Take 500 mg by mouth 2 (two) times daily  as needed for mild pain.   Past Week at Unknown time  . omega-3 acid ethyl esters (LOVAZA) 1 g capsule Take 1 g by mouth daily.   02/16/2016 at Unknown time  . omeprazole (PRILOSEC) 20 MG capsule Take 20 mg by mouth 2 (two) times daily before a meal.   02/16/2016 at Unknown time  . pantoprazole (PROTONIX) 40 MG tablet Take 1 tablet (40 mg total) by mouth daily. 30 tablet 1 02/16/2016 at Unknown time  . potassium chloride (K-DUR,KLOR-CON) 10 MEQ tablet Take 20 mEq by mouth daily.   02/16/2016 at Unknown time  . predniSONE (DELTASONE) 20 MG tablet Take 20-60 mg by mouth daily with breakfast. Pt is to take as a taper:  3 tablets for three days, 2 tablets for three days, one  tablet for three days, then STOP.   02/16/2016 at Unknown time    Current medications: Current Facility-Administered Medications  Medication Dose Route Frequency Provider Last Rate Last Dose  . acetaminophen (TYLENOL) tablet 650 mg  650 mg Oral Q6H PRN Lance Coon, MD   650 mg at 02/22/16 0434   Or  . acetaminophen (TYLENOL) suppository 650 mg  650 mg Rectal Q6H PRN Lance Coon, MD      . alum & mag hydroxide-simeth (MAALOX/MYLANTA) 200-200-20 MG/5ML suspension 15 mL  15 mL Oral TID Gladstone Lighter, MD   15 mL at 02/23/16 0807  . arformoterol (BROVANA) nebulizer solution 15 mcg  15 mcg Nebulization Q12H Lance Coon, MD   15 mcg at 02/23/16 0744  . budesonide (PULMICORT) nebulizer solution 0.5 mg  0.5 mg Nebulization BID Lance Coon, MD   0.5 mg at 02/23/16 0744  . clonazePAM (KLONOPIN) tablet 0.5 mg  0.5 mg Oral BID Gonzella Lex, MD      . diltiazem (CARDIZEM CD) 24 hr capsule 300 mg  300 mg Oral Daily Lance Coon, MD   300 mg at 02/23/16 1610  . diphenhydrAMINE (BENADRYL) capsule 50 mg  50 mg Oral QHS PRN Lance Coon, MD   50 mg at 02/23/16 0217  . enoxaparin (LOVENOX) injection 30 mg  30 mg Subcutaneous Q24H Hillary Bow, MD   30 mg at 02/22/16 1729  . flecainide (TAMBOCOR) tablet 100 mg  100 mg Oral BID Lance Coon, MD   100 mg at 02/23/16 9604  . furosemide (LASIX) tablet 40 mg  40 mg Oral Daily Gladstone Lighter, MD   40 mg at 02/23/16 0808  . ipratropium-albuterol (DUONEB) 0.5-2.5 (3) MG/3ML nebulizer solution 3 mL  3 mL Nebulization Q6H Gladstone Lighter, MD   3 mL at 02/23/16 0744  . lactulose (CHRONULAC) 10 GM/15ML solution 20 g  20 g Oral BID PRN Gladstone Lighter, MD   20 g at 02/23/16 0807  . levothyroxine (SYNTHROID, LEVOTHROID) tablet 75 mcg  75 mcg Oral QAC breakfast Lance Coon, MD   75 mcg at 02/23/16 5409  . LORazepam (ATIVAN) tablet 1 mg  1 mg Oral Q8H PRN Gladstone Lighter, MD   1 mg at 02/23/16 0807  . morphine CONCENTRATE 10 MG/0.5ML oral solution 5 mg  5 mg  Oral Q4H PRN Gladstone Lighter, MD   5 mg at 02/23/16 0814  . ondansetron (ZOFRAN) tablet 4 mg  4 mg Oral Q6H PRN Lance Coon, MD   4 mg at 02/16/16 2233   Or  . ondansetron [facility]) injection 4 mg  4 mg Intravenous Q6H PRN Lance Coon, MD   4 mg at 02/23/16 8119  . pantoprazole (PROTONIX) EC tablet  40 mg  40 mg Oral BID AC Hillary Bow, MD   40 mg at 02/23/16 0808  . polyethylene glycol (MIRALAX / GLYCOLAX) packet 17 g  17 g Oral Daily Gladstone Lighter, MD   Stopped at 02/23/16 [phone]  . predniSONE (DELTASONE) tablet 50 mg  50 mg Oral Q breakfast Gladstone Lighter, MD   50 mg at 02/23/16 0807  . senna-docusate (Senokot-S) tablet 2 tablet  2 tablet Oral BID Gladstone Lighter, MD   Stopped at 02/23/16 [phone]  . sodium chloride flush (NS) 0.9 % injection 3 mL  3 mL Intravenous Q12H Lance Coon, MD   3 mL at 02/23/16 0810  . tiotropium (SPIRIVA) inhalation capsule 18 mcg  18 mcg Inhalation Daily Gladstone Lighter, MD   18 mcg at 02/23/16 0813  . traZODone (DESYREL) tablet 50 mg  50 mg Oral QHS Loletha Grayer, MD   50 mg at 02/22/16 2214      Allergies: Allergies  Allergen Reactions  . Belladonna Alkaloids Anaphylaxis and Other (See Comments)    Pt states that this medication makes her eyes cross.        Past Medical History: Past Medical History  Diagnosis Date  . Cataract   . COPD (chronic obstructive pulmonary disease) ([place])   . Essential hypertension, benign   . Hypothyroidism   . Atypical atrial flutter ([place]) 11/16/2013    a. CHA2DS2VASc = 5-->prev on xarelto, d/c'd due to bleeding skin tear;  c. 11/2013->Placed on flecainide.  . Chronic diastolic heart failure ([place])   . PAF (paroxysmal atrial fibrillation) ([place])     a. 11/2013 Echo: EF 55-60%, mild AS/MR, mod dil LA/RA, PASP 58mHg;  b. CHA2DS2VASc = 5-->prev on xarelto, d/c'd due to bleeding skin tear;  c. 11/2013->Placed on flecainide.  . Lung nodule     Followed by Dr. FRaul Del . Kidney disease, chronic, stage II (mild, EGFR 60+  ml/min)   . Lung cancer ([place]   . Collagen vascular disease ([place]   . Hypokalemia   . Chronic diastolic CHF (congestive heart failure) ([place]     a. 11/2013 Echo: EF 55-60%, mild AS/MR, mod dil LA/RA, PASP 328mg.     Past Surgical History: Past Surgical History  Procedure Laterality Date  . Eye surgery    . Total knee arthroplasty Bilateral      Family History: Family History  Problem Relation Age of Onset  . CAD Father   . Heart disease Father   . Heart attack Father   . Arrhythmia Sister      Social History: Social History   Social History  . Marital Status: Married    Spouse Name: N/A  . Number of Children: N/A  . Years of Education: N/A   Occupational History  . Not on file.   Social History Main Topics  . Smoking status: Former Smoker -- 1.50 packs/day for 50 years    Types: Cigarettes    Quit date: 09/18/2002  . Smokeless tobacco: Never Used  . Alcohol Use: No  . Drug Use: No  . Sexual Activity: Not on file   Other Topics Concern  . Not on file   Social History Narrative     Review of Systems: Gen: Generalized weakness, poor appetite HEENT: denies acute complaints CV: chronic atrial fibrillation, shortness of breath Resp: cough, shortness of breath, increasing lower extremity edema GIQI:WLNLppetite, no nausea or vomiting GU : no complaints MS: history of arthritis, history of Naprosyn use, BCs  Derm:  no acute  rashes Psych:no acute complaints Heme: no acute complaints Neuro: no acute complaints Endocrine anxiety  Vital Signs: Blood pressure 148/74, pulse 80, temperature 98.5 F (36.9 C), temperature source Oral, resp. rate 20, height _0  (1.575 m), weight 90.591 kg (199 lb 11.5 oz), SpO2 97 %.   Intake/Output Summary (Last 24 hours) at 02/23/16 1413 Last data filed at 02/23/16 1300  Gross per 24 hour  Intake    360 ml  Output      0 ml  Net    360 ml    Weight trends: Filed Weights   02/21/16 0500 02/22/16 0527 02/23/16 0500   Weight: 85.503 kg (188 lb 8 oz) 91.037 kg (200 lb 11.2 oz) 90.591 kg (199 lb 11.5 oz)    Physical Exam: General:  elderly woman, laying in the bed, chronically ill-appearing  HEENT Anicteric, moist oral mucous membranes  Neck:  supple  Lungs: Bilateral wheezing and diffuse crackles at bases  Heart:: irregular rhythm, soft systolic murmur present  Abdomen: Soft, nontender, nondistended  Extremities:  2+ peripheral edema bilaterally  Neurologic: Alert, decreased hearing, follows commands  Skin: Scattered bruises             Lab results: Basic Metabolic Panel:  Recent Labs Lab 02/18/16 0110  02/20/16 0523 02/22/16 0505 02/23/16 0512  NA  --   < > 138 137 138  K  --   < > 4.2 4.7 4.7  CL  --   < > 99* 98* 100*  CO2  --   < > _1 GLUCOSE  --   < > 142* 151* 130*  BUN  --   < > 77* 86* 85*  CREATININE  --   < > 1.94* 1.90* 1.86*  CALCIUM  --   < > 8.8* 8.5* 8.6*  MG 1.9  --   --   --   --   < > = values in this interval not displayed.  Liver Function Tests: No results for input(s): AST, ALT, ALKPHOS, BILITOT, PROT, ALBUMIN in the last 168 hours. No results for input(s): LIPASE, AMYLASE in the last 168 hours. No results for input(s): AMMONIA in the last 168 hours.  CBC:  Recent Labs Lab 02/16/16 1647 02/17/16 0613  WBC 7.4 4.7  NEUTROABS 6.2  --   HGB 12.0 10.4*  HCT 36.8 31.9*  MCV 90.3 88.6  PLT 228 208    Cardiac Enzymes:  Recent Labs Lab 02/16/16 1647  TROPONINI 0.03    BNP: Invalid input(s): POCBNP  CBG: No results for input(s): GLUCAP in the last 168 hours.  Microbiology: No results found for this or any previous visit (from the past 720 hour(s)).   Coagulation Studies: No results for input(s): LABPROT, INR in the last 72 hours.  Urinalysis: No results for input(s): COLORURINE, LABSPEC, PHURINE, GLUCOSEU, HGBUR, BILIRUBINUR, KETONESUR, PROTEINUR, UROBILINOGEN, NITRITE, LEUKOCYTESUR in the last 72 hours.  Invalid input(s):  APPERANCEUR      Imaging: Dg Chest 2 View  02/21/2016  CLINICAL DATA:  Shortness of breath EXAM: CHEST  2 VIEW COMPARISON:  February 20, 2016 FINDINGS: Linear atelectasis or scar in the right mid lung. Cardiomegaly. Mild pulmonary venous congestion. No other abnormalities or changes. IMPRESSION: Cardiomegaly with mild pulmonary venous congestion. Electronically Signed   By: Dorise Bullion III M.D   On: 02/21/2016 17:20      Assessment & Plan: Pt is a 81 y.o. yo female with a PMHX of COPD requiring home oxygen at night,  hypothyroidism, hypertension, atrial fibrillation and flutter, chronic diastolic heart failure, lung nodule, history of lung cancer treated with radiation, who was admitted to [facility] on 02/16/2016 for evaluation of shortness of breath.   1.  Acute renal failure on chronic kidney disease stage III Baseline creatinine 1.12/GFR 46 from April 2017 Worsening of creatinine this admission is likely related to cardiorenal volume shifts At present, patient appears to be volume overload  2.  Shortness of breath Likely combination of COPD exacerbation and Acute Pulmonary edema Patient is getting bronchodilators, nebulizers and oral steroids For fluid overload, we will start her on low-dose Lasix infusion and monitored carefully  3.  peripheral edema Plan as above for IV Lasix

## 2016-02-24 MED ORDER — IPRATROPIUM-ALBUTEROL 0.5-2.5 (3) MG/3ML IN SOLN
3.0000 mL | Freq: Four times a day (QID) | RESPIRATORY_TRACT | Status: DC | PRN
  Administered 2016-02-24: 3 mL via RESPIRATORY_TRACT
  Filled 2016-02-24: qty 3

## 2016-02-24 NOTE — Care Management Important Message (Signed)
Important Message  Patient Details  Name: Toni [name] MRN: [id number] Date of Birth: 1935-01-04   Medicare Important Message Given:  Yes    Shelbie Ammons, RN 02/24/2016, 7:18 AM

## 2016-02-24 NOTE — Progress Notes (Signed)
Subjective:   Patient is doing a little better today.  She has had frequent unmeasured voids She appears to be breathing better Still feels weak overall   Objective:  Vital signs in last 24 hours:  Temp:  [97.5 F (36.4 C)-98.5 F (36.9 C)] 98 F (36.7 C) (06/08 1506) Pulse Rate:  [73-102] 102 (06/08 1506) Resp:  [19-22] 22 (06/08 1506) BP: (100-120)/(49-62) 120/49 mmHg (06/08 1506) SpO2:  [93 %-100 %] 94 % (06/08 1506) FiO2 (%):  [28 %] 28 % (06/08 0750) Weight:  [85.004 kg (187 lb 6.4 oz)] 85.004 kg (187 lb 6.4 oz) (06/08 0739)  Weight change:  Filed Weights   02/22/16 0527 02/23/16 0500 02/24/16 0739  Weight: 91.037 kg (200 lb 11.2 oz) 90.591 kg (199 lb 11.5 oz) 85.004 kg (187 lb 6.4 oz)    Intake/Output:    Intake/Output Summary (Last 24 hours) at 02/24/16 1516 Last data filed at 02/24/16 1300  Gross per 24 hour  Intake  412.6 ml  Output    450 ml  Net  -37.4 ml     Physical Exam: General: No acute distress, laying in the bed  HEENT Anicteric, moist mucous membranes, decreased hearing  Neck supple  Pulm/lungs Bilateral fine crackles at bases  CVS/Heart 2/ 6 systolic murmur, regular  Abdomen:  Soft, nontender, nondistended  Extremities: + dependent edema  Neurologic: Alert, able to answer questions and follow commands  Skin: No acute rashes          Basic Metabolic Panel:   Recent Labs Lab 02/18/16 0110  02/19/16 0639 02/20/16 0523 02/22/16 0505 02/23/16 0512  NA  --   --  138 138 137 138  K  --   --  3.9 4.2 4.7 4.7  CL  --   --  98* 99* 98* 100*  CO2  --   --  '27 28 29 29  '$ GLUCOSE  --   --  138* 142* 151* 130*  BUN  --   --  75* 77* 86* 85*  CREATININE  --   --  2.10* 1.94* 1.90* 1.86*  CALCIUM  --   < > 8.9 8.8* 8.5* 8.6*  MG 1.9  --   --   --   --   --   < > = values in this interval not displayed.   CBC: No results for input(s): WBC, NEUTROABS, HGB, HCT, MCV, PLT in the last 168 hours.    Microbiology:  No results found for  this or any previous visit (from the past 720 hour(s)).  Coagulation Studies: No results for input(s): LABPROT, INR in the last 72 hours.  Urinalysis: No results for input(s): COLORURINE, LABSPEC, PHURINE, GLUCOSEU, HGBUR, BILIRUBINUR, KETONESUR, PROTEINUR, UROBILINOGEN, NITRITE, LEUKOCYTESUR in the last 72 hours.  Invalid input(s): APPERANCEUR    Imaging: No results found.   Medications:   . furosemide (LASIX) infusion 4 mg/hr (02/24/16 1144)   . arformoterol  15 mcg Nebulization Q12H  . budesonide  0.5 mg Nebulization BID  . clonazePAM  0.5 mg Oral BID  . diltiazem  300 mg Oral Daily  . enoxaparin (LOVENOX) injection  30 mg Subcutaneous Q24H  . flecainide  100 mg Oral BID  . levothyroxine  75 mcg Oral QAC breakfast  . pantoprazole  40 mg Oral BID AC  . polyethylene glycol  17 g Oral Daily  . predniSONE  50 mg Oral Q breakfast  . senna-docusate  2 tablet Oral BID  . sodium chloride flush  3  mL Intravenous Q12H  . tiotropium  18 mcg Inhalation Daily  . traZODone  50 mg Oral QHS   acetaminophen **OR** acetaminophen, diphenhydrAMINE, ipratropium-albuterol, lactulose, LORazepam, morphine CONCENTRATE, ondansetron **OR** ondansetron (ZOFRAN) IV  Assessment/ Plan:  81 y.o. female with a PMHX of COPD requiring home oxygen at night, hypothyroidism, hypertension, atrial fibrillation and flutter, chronic diastolic heart failure, lung nodule, history of lung cancer treated with radiation, who was admitted to [facility] on 02/16/2016 for evaluation of shortness of breath.   1. Acute renal failure on chronic kidney disease stage III Baseline creatinine 1.12/GFR 46 from April 2017 Worsening of creatinine this admission is likely related to cardiorenal volume shifts At present, patient appears to be volume overload  2. Shortness of breath Likely combination of COPD exacerbation and Acute Pulmonary edema Patient is getting bronchodilators, nebulizers and oral steroids We will continue her  on low-dose Lasix infusion and monitor carefully - standing weight daily  3. peripheral edema Plan as above for IV Lasix   LOS: 8 Meagan Ancona 6/8/20173:16 PM

## 2016-02-24 NOTE — Progress Notes (Signed)
[place] [facility] [place] NAME: Toni [name]    MR#:  [id number]  DATE OF BIRTH:  12-Apr-1935  SUBJECTIVE:  CHIEF COMPLAINT:   Chief Complaint  Patient presents with  . Respiratory Distress   - Breathing is improved,  Remains on 2 L oxygen. - started on lasix drip due to fluid overload. Proper documentation of intake and output and daily weights needed. -Anxiety has improved  REVIEW OF SYSTEMS:  Review of Systems  Constitutional: Positive for malaise/fatigue. Negative for fever, chills and weight loss.  HENT: Negative for ear discharge, ear pain, hearing loss and nosebleeds.   Eyes: Negative for blurred vision, double vision and photophobia.  Respiratory: Positive for shortness of breath. Negative for cough, hemoptysis and wheezing.   Cardiovascular: Negative for chest pain, palpitations, orthopnea and leg swelling.  Gastrointestinal: Positive for heartburn and constipation. Negative for nausea, vomiting, abdominal pain, diarrhea and melena.  Genitourinary: Negative for dysuria, urgency and frequency.  Musculoskeletal: Negative for myalgias and neck pain.  Skin: Negative for rash.  Neurological: Negative for dizziness, tingling, sensory change, speech change, focal weakness and headaches.  Endo/Heme/Allergies: Does not bruise/bleed easily.  Psychiatric/Behavioral: Negative for depression. The patient is nervous/anxious.     DRUG ALLERGIES:   Allergies  Allergen Reactions  . Belladonna Alkaloids Anaphylaxis and Other (See Comments)    Pt states that this medication makes her eyes cross.      VITALS:  Blood pressure 107/62, pulse 76, temperature 98 F (36.7 C), temperature source Oral, resp. rate 19, height '5\' 2"'$  (1.575 m), weight 85.004 kg (187 lb 6.4 oz), SpO2 100 %.  PHYSICAL EXAMINATION:  Physical Exam  GENERAL:  81 y.o.-year-old patient Sitting in the bed , not in acute distress EYES: Pupils equal, round, reactive to  light and accommodation. No scleral icterus. Extraocular muscles intact.  HEENT: Head atraumatic, normocephalic. Oropharynx and nasopharynx clear.  NECK:  Supple, no jugular venous distention. No thyroid enlargement, no tenderness.  LUNGS:clear breath sounds, scattered wheezing, no rales,rhonchi or crepitation. No use of accessory muscles of respiration.  CARDIOVASCULAR: S1, S2 normal. No murmurs, rubs, or gallops.  ABDOMEN: Soft, nontender, nondistended. Bowel sounds present. No organomegaly or mass.  EXTREMITIES: No pedal edema, cyanosis, or clubbing.  NEUROLOGIC: Cranial nerves II through XII are intact. Muscle strength 5/5 in all extremities. Sensation intact. Gait not checked.  PSYCHIATRIC: The patient is alert and oriented x 3. SKIN: No obvious rash, lesion, or ulcer.    LABORATORY PANEL:   CBC No results for input(s): WBC, HGB, HCT, PLT in the last 168 hours. ------------------------------------------------------------------------------------------------------------------  Chemistries   Recent Labs Lab 02/18/16 0110  02/23/16 0512  NA  --   < > 138  K  --   < > 4.7  CL  --   < > 100*  CO2  --   < > 29  GLUCOSE  --   < > 130*  BUN  --   < > 85*  CREATININE  --   < > 1.86*  CALCIUM  --   < > 8.6*  MG 1.9  --   --   < > = values in this interval not displayed. ------------------------------------------------------------------------------------------------------------------  Cardiac Enzymes No results for input(s): TROPONINI in the last 168 hours. ------------------------------------------------------------------------------------------------------------------  RADIOLOGY:  No results found.  EKG:   Orders placed or performed during the hospital encounter of 02/16/16  . EKG 12-Lead  . EKG 12-Lead  . EKG 12-Lead  .  EKG 12-Lead  . EKG 12-Lead  . EKG 12-Lead    ASSESSMENT AND PLAN:   Toni [name] a 88-year-old female with past medical history significant for COPD on  nocturnal oxygen, hypertension, paroxysmal atrial flutter/fibrillation, CK D, diastolic congestive heart failure presents to hospital secondary to worsening shortness of breath.  #1 acute on chronic COPD exacerbation- improving. on oral prednisone. - DuoNeb's prn -Continue her brovana, Pulmicort and added Spiriva since nebs were only when necessary -  low-dose Roxanol for her dyspnea. -Appreciate pulmonary consult. -finished azithromycin. Chest x-ray with no infiltrates.  #2 chronic diastolic dysfunction-Echo with EF 60%, severe pulmonary hypertension noted.. -Restarted Lasix as mild pulmonary vascular congestion on chest x-ray noted. -   #3 acute on chronic renal failure- Baseline creatinine seems to be around 1.2. -Worsened- cardiorenal syndrome. Appreciate nephrology consult- started on lasix drip on 02/23/16- continue for today - daily weights- standing and also intake and output measuring strictly - hold nephrotoxins  #4 Severe anxiety- Appreciate psych consult - on klonopin- seems to be much improving Already on trazadone  #5 GERD- severe heartburn and constant belching- Appreciate GI consult - on Protonix and also Maalox, advised to avoid stimulants that worse her reflux like chocolate - better today- no plans for any invasive procedures with her tenuous respiratory status  #6 DVT prophylaxis-on Lovenox  #7 paroxysmal atrial fibrillation-patient is on flecainide.  Also on cardizem Due to risk of falls- not on anticoagulation, Follows with Dr. Rockey Situ -Outpatient cardiology follow up. ECHO normal   All the records are reviewed and case discussed with Care Management/Social Workerr. Management plans discussed with the patient, family and they are in agreement.  CODE STATUS: Full code  TOTAL TIME TAKING CARE OF THIS PATIENT: 38 minutes.   POSSIBLE D/C IN 1-2 DAYS, DEPENDING ON CLINICAL CONDITION.   Gladstone Lighter M.D on 02/24/2016 at 1:43 PM  Between 7am to 6pm - Pager  - [phone]  After 6pm go to [email or web address] - password [email or web address] [facility]  Office  [phone]  CC: Primary care physician; Pcp Not In System

## 2016-02-24 NOTE — Progress Notes (Signed)
Nurse notified MD Tressia Miners, received order to D/C tele

## 2016-02-24 NOTE — Progress Notes (Signed)
Physical Therapy Treatment Patient Details Name: Toni [name] MRN: [id number] DOB: 07-26-1935 Today's Date: 02/24/2016    History of Present Illness Toni [name] is a 81 y.o. female who presents with Progressive shortness of breath. Patient states that her breathing has been getting progressively worse over the past 2-3 days. She denies any fever or chills, significant increase in sputum production, states that she has had somewhat of a cough as well as some wheezing. She states that her home treatments were progressively less effective as well. She went and saw her pulmonologist this morning and was put on by mouth prednisone as well as by mouth Levaquin, but states that throughout the day her breathing discussing if worse. She came to the ED for evaluation and was found to be initially hypoxic, which corrected well with supple oxygen, she is on 4 L of O2 nasal cannula the time of this interview with good O2 saturation.    PT Comments    Pt is making good progress towards goals with increased ambulation distance this date. Pt still fatigues quickly with further distance, however O2 sats remained stable. Pt educated on energy conservation techniques for home use. All mobility performed on 2L of O2. Good endurance with there-ex. Pt is approaching baseline status.  Follow Up Recommendations  Home health PT     Equipment Recommendations       Recommendations for Other Services       Precautions / Restrictions Precautions Precautions: Fall Restrictions Weight Bearing Restrictions: No    Mobility  Bed Mobility               General bed mobility comments: not performed as pt received in recliner  Transfers Overall transfer level: Needs assistance Equipment used: Rolling walker (2 wheeled) Transfers: Sit to/from Stand Sit to Stand: Supervision         General transfer comment: transfer performed with rw and supervision. Pt demonstrates upright posture. All mobility  performed on 2L of O2.  Ambulation/Gait Ambulation/Gait assistance: Min guard Ambulation Distance (Feet): 70 Feet Assistive device: Rolling walker (2 wheeled) Gait Pattern/deviations: Step-to pattern     General Gait Details: ambulated with rw and cga. Safe technique performed, although patient fatigues quickly with increased distance. No LOB noted. O2 sats remained WNL during ambulation.   Stairs            Wheelchair Mobility    Modified Rankin (Stroke Patients Only)       Balance                                    Cognition Arousal/Alertness: Awake/alert Behavior During Therapy: WFL for tasks assessed/performed Overall Cognitive Status: Within Functional Limits for tasks assessed                      Exercises Other Exercises Other Exercises: Seated ther-ex performed including B LE LAQ, hip abd/add, bicep curls, ankle pumps, and quad sets. All ther-ex performed x 15 reps with cues and cga.    General Comments        Pertinent Vitals/Pain Pain Assessment: No/denies pain    Home Living                      Prior Function            PT Goals (current goals can now be found in the care plan section) Acute  Rehab PT Goals Patient Stated Goal: to go home PT Goal Formulation: With patient Time For Goal Achievement: 03/05/16 Potential to Achieve Goals: Good Progress towards PT goals: Progressing toward goals    Frequency  Min 2X/week    PT Plan Current plan remains appropriate    Co-evaluation             End of Session Equipment Utilized During Treatment: Gait belt;Oxygen Activity Tolerance: Patient tolerated treatment well Patient left: in chair;with chair alarm set     Time: 4765-4650 PT Time Calculation (min) (ACUTE ONLY): 23 min  Charges:  $Gait Training: 8-22 mins $Therapeutic Exercise: 8-22 mins                    G Codes:      Aanika Defoor 02/25/16, 1:39 PM Greggory Stallion, PT,  DPT [phone]

## 2016-02-24 NOTE — Care Management (Addendum)
Spoke with Toni [name] at the bedside. States that she is probably going home today and would like Home Health and Physical therapy to visit. Gave permission to see if Dana comes to [facility], Received telephone call back from Floydene Flock, [facility] representative. States that [employer] goes to [employer] [facility]. Will discuss with Ms. Cuccia. Shelbie Ammons RN MSN CCM Care Management [phone]

## 2016-02-25 ENCOUNTER — Ambulatory Visit: Payer: Medicare Other | Admitting: Internal Medicine

## 2016-02-25 LAB — BASIC METABOLIC PANEL
Anion gap: 10 (ref 5–15)
BUN: 69 mg/dL — AB (ref 6–20)
CALCIUM: 7.8 mg/dL — AB (ref 8.9–10.3)
CO2: 35 mmol/L — ABNORMAL HIGH (ref 22–32)
Chloride: 93 mmol/L — ABNORMAL LOW (ref 101–111)
Creatinine, Ser: 1.38 mg/dL — ABNORMAL HIGH (ref 0.44–1.00)
GFR calc Af Amer: 41 mL/min — ABNORMAL LOW (ref >60)
GFR, EST NON AFRICAN AMERICAN: 35 mL/min — AB (ref >60)
GLUCOSE: 94 mg/dL (ref 65–99)
POTASSIUM: 3.4 mmol/L — AB (ref 3.5–5.1)
SODIUM: 138 mmol/L (ref 135–145)

## 2016-02-25 LAB — CBC
HCT: 32.9 % — ABNORMAL LOW (ref 35.0–47.0)
Hemoglobin: 10.8 g/dL — ABNORMAL LOW (ref 12.0–16.0)
MCH: 28.9 pg (ref 26.0–34.0)
MCHC: 32.9 g/dL (ref 32.0–36.0)
MCV: 88 fL (ref 80.0–100.0)
PLATELETS: 221 10*3/uL (ref 150–440)
RBC: 3.74 MIL/uL — AB (ref 3.80–5.20)
RDW: 16.5 % — AB (ref 11.5–14.5)
WBC: 10.4 10*3/uL (ref 3.6–11.0)

## 2016-02-25 MED ORDER — CITALOPRAM HYDROBROMIDE 20 MG PO TABS
10.0000 mg | ORAL_TABLET | Freq: Every day | ORAL | Status: DC
  Administered 2016-02-25 – 2016-02-26 (×2): 10 mg via ORAL
  Filled 2016-02-25 (×2): qty 1

## 2016-02-25 MED ORDER — ENOXAPARIN SODIUM 40 MG/0.4ML ~~LOC~~ SOLN
40.0000 mg | SUBCUTANEOUS | Status: DC
  Administered 2016-02-25: 40 mg via SUBCUTANEOUS
  Filled 2016-02-25: qty 0.4

## 2016-02-25 MED ORDER — POTASSIUM CHLORIDE CRYS ER 20 MEQ PO TBCR
40.0000 meq | EXTENDED_RELEASE_TABLET | Freq: Every day | ORAL | Status: AC
  Administered 2016-02-25 – 2016-02-26 (×2): 40 meq via ORAL
  Filled 2016-02-25 (×2): qty 2

## 2016-02-25 MED ORDER — MELATONIN 5 MG PO TABS
5.0000 mg | ORAL_TABLET | Freq: Every evening | ORAL | Status: DC | PRN
  Filled 2016-02-25: qty 1

## 2016-02-25 NOTE — Progress Notes (Signed)
SATURATION QUALIFICATIONS: (This note is used to comply with regulatory documentation for home oxygen)  Patient Saturations on Room Air at Rest = 94%  Patient Saturations on Room Air while Ambulating = 93%  Patient Saturations on 2 Liters of oxygen while Ambulating = 94%  Please briefly explain why patient needs home oxygen:

## 2016-02-25 NOTE — Progress Notes (Signed)
Subjective:   Patient is doing a little better today.  Weight this morning 88.1 kg Breathing is a little better Serum creatinine improved to 1.8  Objective:  Vital signs in last 24 hours:  Temp:  [97.4 F (36.3 C)-98.1 F (36.7 C)] 98 F (36.7 C) (06/09 0818) Pulse Rate:  [71-102] 77 (06/09 0818) Resp:  [20-22] 20 (06/09 0302) BP: (103-130)/(49-64) 117/64 mmHg (06/09 0818) SpO2:  [94 %-100 %] 100 % (06/09 0818) FiO2 (%):  [32 %] 32 % (06/08 2010) Weight:  [88.134 kg (194 lb 4.8 oz)] 88.134 kg (194 lb 4.8 oz) (06/09 0500)  Weight change:  Filed Weights   02/23/16 0500 02/24/16 0739 02/25/16 0500  Weight: 90.591 kg (199 lb 11.5 oz) 85.004 kg (187 lb 6.4 oz) 88.134 kg (194 lb 4.8 oz)    Intake/Output:    Intake/Output Summary (Last 24 hours) at 02/25/16 1402 Last data filed at 02/25/16 0900  Gross per 24 hour  Intake    360 ml  Output    500 ml  Net   -140 ml     Physical Exam: General: No acute distress, sitting up in chair  HEENT Anicteric, moist mucous membranes, decreased hearing  Neck supple  Pulm/lungs Bilateral fine crackles at bases  CVS/Heart 2/ 6 systolic murmur, regular  Abdomen:  Soft, nontender, nondistended  Extremities: + dependent edema  Neurologic: Alert, able to answer questions and follow commands  Skin: No acute rashes          Basic Metabolic Panel:   Recent Labs Lab 02/19/16 0639 02/20/16 0523 02/22/16 0505 02/23/16 0512 02/25/16 0643  NA 138 138 137 138 138  K 3.9 4.2 4.7 4.7 3.4*  CL 98* 99* 98* 100* 93*  CO2 '27 28 29 29 '$ 35*  GLUCOSE 138* 142* 151* 130* 94  BUN 75* 77* 86* 85* 69*  CREATININE 2.10* 1.94* 1.90* 1.86* 1.38*  CALCIUM 8.9 8.8* 8.5* 8.6* 7.8*     CBC:  Recent Labs Lab 02/25/16 0643  WBC 10.4  HGB 10.8*  HCT 32.9*  MCV 88.0  PLT 221      Microbiology:  No results found for this or any previous visit (from the past 720 hour(s)).  Coagulation Studies: No results for input(s): LABPROT, INR in  the last 72 hours.  Urinalysis: No results for input(s): COLORURINE, LABSPEC, PHURINE, GLUCOSEU, HGBUR, BILIRUBINUR, KETONESUR, PROTEINUR, UROBILINOGEN, NITRITE, LEUKOCYTESUR in the last 72 hours.  Invalid input(s): APPERANCEUR    Imaging: No results found.   Medications:   . furosemide (LASIX) infusion 4 mg/hr (02/24/16 2308)   . arformoterol  15 mcg Nebulization Q12H  . budesonide  0.5 mg Nebulization BID  . clonazePAM  0.5 mg Oral BID  . diltiazem  300 mg Oral Daily  . enoxaparin (LOVENOX) injection  40 mg Subcutaneous Q24H  . flecainide  100 mg Oral BID  . levothyroxine  75 mcg Oral QAC breakfast  . pantoprazole  40 mg Oral BID AC  . polyethylene glycol  17 g Oral Daily  . potassium chloride  40 mEq Oral Daily  . predniSONE  50 mg Oral Q breakfast  . senna-docusate  2 tablet Oral BID  . sodium chloride flush  3 mL Intravenous Q12H  . tiotropium  18 mcg Inhalation Daily  . traZODone  50 mg Oral QHS   acetaminophen **OR** acetaminophen, diphenhydrAMINE, ipratropium-albuterol, lactulose, LORazepam, morphine CONCENTRATE, ondansetron **OR** ondansetron (ZOFRAN) IV  Assessment/ Plan:  81 y.o. female with a PMHX of COPD requiring home  oxygen at night, hypothyroidism, hypertension, atrial fibrillation and flutter, chronic diastolic heart failure, lung nodule, history of lung cancer treated with radiation, who was admitted to [facility] on 02/16/2016 for evaluation of shortness of breath.   1. Acute renal failure on chronic kidney disease stage III Baseline creatinine 1.12/GFR 46 from April 2017 Worsening of creatinine this admission is likely related to cardiorenal volume shifts At present, patient appears to be volume overload  2. Shortness of breath Likely combination of COPD exacerbation and Acute Pulmonary edema Patient is getting bronchodilators, nebulizers and oral steroids We will continue her on low-dose Lasix infusion and monitor carefully - standing weight daily -  hopefully, we will be able to get her off Lasix drip tomorrow  3. peripheral edema Plan as above for IV Lasix   LOS: 9 Mervil Wacker 6/9/20172:02 PM

## 2016-02-25 NOTE — Progress Notes (Signed)
[facility] [place] NAME: Toni [name]    MR#:  [id number]  DATE OF BIRTH:  02/21/1935  SUBJECTIVE:  CHIEF COMPLAINT:   Chief Complaint  Patient presents with  . Respiratory Distress   - Feels much better. Breathing is improved. Sitting in the chair today. Remains on Lasix drip. -Renal function is improving. -Patient feels like she is depressed and very tearful, denies any suicidal ideation.  REVIEW OF SYSTEMS:  Review of Systems  Constitutional: Positive for malaise/fatigue. Negative for fever, chills and weight loss.  HENT: Negative for ear discharge, ear pain, hearing loss and nosebleeds.   Eyes: Negative for blurred vision, double vision and photophobia.  Respiratory: Positive for shortness of breath. Negative for cough, hemoptysis and wheezing.   Cardiovascular: Negative for chest pain, palpitations, orthopnea and leg swelling.  Gastrointestinal: Positive for heartburn. Negative for nausea, vomiting, abdominal pain, diarrhea, constipation and melena.  Genitourinary: Negative for dysuria, urgency and frequency.  Musculoskeletal: Negative for myalgias and neck pain.  Skin: Negative for rash.  Neurological: Negative for dizziness, tingling, sensory change, speech change, focal weakness and headaches.  Endo/Heme/Allergies: Does not bruise/bleed easily.  Psychiatric/Behavioral: Positive for depression. The patient is nervous/anxious.     DRUG ALLERGIES:   Allergies  Allergen Reactions  . Belladonna Alkaloids Anaphylaxis and Other (See Comments)    Pt states that this medication makes her eyes cross.      VITALS:  Blood pressure 117/64, pulse 77, temperature 98 F (36.7 C), temperature source Oral, resp. rate 20, height '5\' 2"'$  (1.575 m), weight 88.134 kg (194 lb 4.8 oz), SpO2 100 %.  PHYSICAL EXAMINATION:  Physical Exam  GENERAL:  81 y.o.-year-old patient Sitting in the bed , not in acute distress EYES: Pupils equal,  round, reactive to light and accommodation. No scleral icterus. Extraocular muscles intact.  HEENT: Head atraumatic, normocephalic. Oropharynx and nasopharynx clear.  NECK:  Supple, no jugular venous distention. No thyroid enlargement, no tenderness.  LUNGS: clear breath sounds, scattered wheezing, no rales,rhonchi or crepitation. No use of accessory muscles of respiration.  CARDIOVASCULAR: S1, S2 normal. No murmurs, rubs, or gallops.  ABDOMEN: Soft, nontender, nondistended. Bowel sounds present. No organomegaly or mass.  EXTREMITIES: No pedal edema, cyanosis, or clubbing.  NEUROLOGIC: Cranial nerves II through XII are intact. Muscle strength 5/5 in all extremities. Sensation intact. Gait not checked.  PSYCHIATRIC: The patient is alert and oriented x 3. SKIN: No obvious rash, lesion, or ulcer.    LABORATORY PANEL:   CBC  Recent Labs Lab 02/25/16 0643  WBC 10.4  HGB 10.8*  HCT 32.9*  PLT 221   ------------------------------------------------------------------------------------------------------------------  Chemistries   Recent Labs Lab 02/25/16 0643  NA 138  K 3.4*  CL 93*  CO2 35*  GLUCOSE 94  BUN 69*  CREATININE 1.38*  CALCIUM 7.8*   ------------------------------------------------------------------------------------------------------------------  Cardiac Enzymes No results for input(s): TROPONINI in the last 168 hours. ------------------------------------------------------------------------------------------------------------------  RADIOLOGY:  No results found.  EKG:   Orders placed or performed during the hospital encounter of 02/16/16  . EKG 12-Lead  . EKG 12-Lead  . EKG 12-Lead  . EKG 12-Lead  . EKG 12-Lead  . EKG 12-Lead    ASSESSMENT AND PLAN:   [name] a 49-year-old female with past medical history significant for COPD on nocturnal oxygen, hypertension, paroxysmal atrial flutter/fibrillation, CK D, diastolic congestive heart failure presents to  hospital secondary to worsening shortness of breath.  #1 acute on chronic COPD exacerbation- improving.  on oral prednisone. - DuoNeb's prn -Continue her brovana, Pulmicort and added Spiriva since nebs were only when necessary -  low-dose Roxanol for her dyspnea. -Appreciate pulmonary consult. -finished azithromycin. Chest x-ray with no infiltrates.  #2 Acute on chronic chronic diastolic dysfunction-Echo with EF 60%, severe pulmonary hypertension noted.. -on Lasix drip as pulmonary vascular congestion on chest x-ray noted. -   #3 acute on chronic renal failure- Baseline creatinine seems to be around 1.2. - Much improving now -Worsened secondary to cardiorenal syndrome. Appreciate nephrology consult- started on lasix drip on 02/23/16- continue for today as well - daily weights- and also intake and output measuring strictly - hold nephrotoxins  #4 Severe anxiety and depression- Appreciate psych consult - on klonopin- anxiety seems to be much improving Already on trazadone -Psych to f/u on depression today- discussed with Dr. Weber Cooks  #5 GERD- heartburn and belching- Appreciate GI consult - on Protonix and also Maalox, advised to avoid stimulants that worse her reflux like chocolate - better now- no plans for any invasive procedures with her tenuous respiratory status  #6 DVT prophylaxis-on Lovenox  #7 paroxysmal atrial fibrillation-patient is on flecainide.  Also on cardizem Due to risk of falls- not on anticoagulation, Follows with Dr. Rockey Situ -Outpatient cardiology follow up. ECHO normal  Possible discharge tomorrow once off of lasix drip. Doesn't need home o2 continuous as she didn't qualify today. Can go back to using her nocturnal o2   All the records are reviewed and case discussed with Care Management/Social Workerr. Management plans discussed with the patient, family and they are in agreement.  CODE STATUS: Full code  TOTAL TIME TAKING CARE OF THIS PATIENT: 38 minutes.    POSSIBLE D/C TOMORROW, DEPENDING ON CLINICAL CONDITION.   Gladstone Lighter M.D on 02/25/2016 at 2:11 PM  Between 7am to 6pm - Pager - [phone]  After 6pm go to [email or web address] - password [email or web address] [facility]  Office  [phone]  CC: Primary care physician; Pcp Not In System

## 2016-02-25 NOTE — Care Management (Addendum)
Requested O2 sat/assessment on this patient. Rx in orders. This RNCM will fax orders to [facility] for continues O2 if she qualifies. [facility]. [facility] [phone] fax [phone].

## 2016-02-25 NOTE — Consult Note (Signed)
[facility] Consult   Reason for Consult:  Consult for 81 year old woman without past psychiatric history currently with symptoms of anxiety Referring Physician:  Tressia Miners Patient Identification: Toni [name] MRN:  [id number] Principal Diagnosis: Adjustment disorder with anxiety Diagnosis:   Patient Active Problem List   Diagnosis Date Noted  . Adjustment disorder with anxiety [F43.22] 02/23/2016  . Dyspepsia [R10.13]   . Esophageal reflux [K21.9]   . Encounter for anticoagulation discussion and counseling [Z71.89] 11/17/2015  . Fall [W19.XXXA] 11/17/2015  . Essential hypertension, benign [I10]   . Lung infiltrate on CT [R91.8] 06/08/2015  . Chronic diastolic CHF (congestive heart failure) ([place]) [I50.32] 05/17/2015  . Insomnia [G47.00] 04/17/2014  . Squamous cell lung cancer ([place]) [C34.90] 03/27/2014  . Postnasal drip [R09.82] 03/05/2014  . Hyperlipidemia [E78.5] 12/04/2013  . Leg edema [R60.0] 12/04/2013  . Atypical atrial flutter ([place]) [I48.4] 11/16/2013  . COPD (chronic obstructive pulmonary disease) ([place]) [J44.9] 11/15/2013  . Chronic kidney disease [N18.9] 08/24/2013  . Essential (primary) hypertension [I10] 08/24/2013  . Adult hypothyroidism [E03.9] 08/24/2013  . Acute exacerbation of chronic obstructive airways disease ([place]) [J44.1] 08/24/2013  . Chronic obstructive pulmonary disease with acute exacerbation (H[facility]) [J44.1] 08/24/2013    Total Time spent with patient: 1 hour  Subjective:   Toni [name] is a 81 y.o. female patient admitted with "I'm having some trouble catching my breath".   Follow-up for Friday, June 9. Patient interviewed. Spoke with the hospitalist. Hospital is reported that the patient was crying this morning and saying that she was feeling more depressed. When I spoke to her today the patient admitted that. Said she woke up today feeling sad and depressed and was tearful. She can't attribute any reason to it. Her anxiety is better and  she is breathing better. She denies any suicidal thoughts. There doesn't appear to be any psychotic symptomatology present. Lissa Hoard she is doing better and she appears to be making every effort to cooperate with treatment. The most she could say is that maybe she was crying just because she wanted to go home so much.  HPI:  Patient interviewed. Chart reviewed. 81 year old woman with a history of COPD and shortness of breath who is also complaining of frequent burping and GI discomfort. She has had some symptoms of anxiety. Patient is aware that she is occasionally getting symptoms of anxiety mostly with physical symptoms of feeling more short of breath which makes her more panicky. She's had some trouble sleeping for the last few days. She does not report feeling depressed. Does not report any suicidal thoughts. Does not report any hallucinations or psychotic symptoms. She has been given when necessary doses of Ativan which have helped her to feel better. Patient is not reporting any distress other than her medical illness. Overall positive and looking forward to going home.  Medical history: Patient has past history of lung cancer, history of chronic COPD, atrial flutter hypertension esophageal reflux. Multiple medical problems.  Social history: Lives with her husband. 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 He enjoys her work. Has 3 adult sons. Good relationship with the whole family.  Substance abuse history: Does not drink. Has never had a drinking problem. Denies any substance abuse. No evidence of problems with abuse of prescription medicine.  Past Psychiatric History: Patient has not been treated for mental health or psychiatric problems in the past. Does not think of  herself as having a chronic anxiety disorder. Never been in a psychiatric hospital no history of suicidality no history of psychosis.  She does report that when she's been given corticosteroids for her lung problems in the past they have made her feel jittery.  Risk to Self: Is patient at risk for suicide?: No Risk to Others:   Prior Inpatient Therapy:   Prior Outpatient Therapy:    Past Medical History:  Past Medical History  Diagnosis Date  . Cataract   . COPD (chronic obstructive pulmonary disease) ([place])   . Essential hypertension, benign   . Hypothyroidism   . Atypical atrial flutter ([place]) 11/16/2013    a. CHA2DS2VASc = 5-->prev on xarelto, d/c'd due to bleeding skin tear;  c. 11/2013->Placed on flecainide.  . Chronic diastolic heart failure ([place])   . PAF (paroxysmal atrial fibrillation) ([place])     a. 11/2013 Echo: EF 55-60%, mild AS/MR, mod dil LA/RA, PASP 77mHg;  b. CHA2DS2VASc = 5-->prev on xarelto, d/c'd due to bleeding skin tear;  c. 11/2013->Placed on flecainide.  . Lung nodule     Followed by Dr. FRaul Del . 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 Lung cancer ([place] [facility]   . Collagen vascular disease ([place]   . Hypokalemia   . Chronic diastolic CHF (congestive heart failure) ([place]     a. 11/2013 Echo: EF 55-60%, mild AS/MR, mod dil LA/RA, PASP 348mg.    Past Surgical History  Procedure Laterality Date  . Eye surgery    . Total knee arthroplasty Bilateral    Family History:  Family History  Problem Relation Age of Onset  . CAD Father   . Heart disease Father   . Heart attack Father   . Arrhythmia Sister    Family Psychiatric  History: Sounds she had a sister with some kind of mental health problems but otherwise no family history identified Social History:  History  Alcohol Use No     History  Drug Use No    Social History   Social History  . Marital Status: Married    Spouse Name: N/A  . Number of Children: N/A  . Years of Education: N/A   Social History Main Topics  . Smoking status: Former Smoker -- 1.50 packs/day for 50 years    Types: Cigarettes    Quit date:  09/18/2002  . Smokeless tobacco: Never Used  . Alcohol Use: No  . Drug Use: No  . Sexual Activity: Not Asked   Other Topics Concern  . None   Social History Narrative   Additional Social History:    Allergies:   Allergies  Allergen Reactions  . Belladonna Alkaloids Anaphylaxis and Other (See Comments)    Pt states that this medication makes her eyes cross.      Labs:  Results for orders placed or performed during the hospital encounter of 02/16/16 (from the past 48 hour(s))  CBC     Status: Abnormal   Collection Time: 02/25/16  6:43 AM  Result Value Ref Range   WBC 10.4 3.6 - 11.0 K/uL   RBC 3.74 (L) 3.80 - 5.20 MIL/uL   Hemoglobin 10.8 (L) 12.0 - 16.0 g/dL   HCT 32.9 (L) 35.0 - 47.0 %   MCV 88.0 80.0 - 100.0 fL   MCH 28.9 26.0 - 34.0 pg   MCHC 32.9 32.0 - 36.0 g/dL   RDW 16.5 (H) 11.5 - 14.5 %   Platelets 221 150 - 440 K/uL  Basic metabolic panel     Status: Abnormal   Collection Time: 02/25/16  6:43 AM  Result Value Ref Range   Sodium 138 135 - 145 mmol/L   Potassium 3.4 (L) 3.5 - 5.1 mmol/L   Chloride 93 (L) 101 - 111 mmol/L   CO2 35 (H) 22 - 32 mmol/L   Glucose, Bld 94 65 - 99 mg/dL   BUN 69 (H) 6 - 20 mg/dL   Creatinine, Ser 1.38 (H) 0.44 - 1.00 mg/dL   Calcium 7.8 (L) 8.9 - 10.3 mg/dL   GFR calc non Af Amer 35 (L) >60 mL/min   GFR calc Af Amer 41 (L) >60 mL/min    Comment: (NOTE) The eGFR has been calculated using the CKD EPI equation. This calculation has not been validated in all clinical situations. eGFR's persistently <60 mL/min signify possible Chronic Kidney Disease.    Anion gap 10 5 - 15    Current Facility-Administered Medications  Medication Dose Route Frequency Provider Last Rate Last Dose  . acetaminophen (TYLENOL) tablet 650 mg  650 mg Oral Q6H PRN Lance Coon, MD   650 mg at 02/25/16 0754   Or  . acetaminophen (TYLENOL) suppository 650 mg  650 mg Rectal Q6H PRN Lance Coon, MD      . arformoterol [facility]) nebulizer solution 15 mcg   15 mcg Nebulization Q12H Lance Coon, MD   15 mcg at 02/25/16 0737  . budesonide (PULMICORT) nebulizer solution 0.5 mg  0.5 mg Nebulization BID Lance Coon, MD   0.5 mg at 02/25/16 0737  . citalopram (CELEXA) tablet 10 mg  10 mg Oral Daily Gonzella Lex, MD      . clonazePAM [facility]) tablet 0.5 mg  0.5 mg Oral BID Gonzella Lex, MD   0.5 mg at 02/25/16 0754  . diltiazem (CARDIZEM CD) 24 hr capsule 300 mg  300 mg Oral Daily Lance Coon, MD   300 mg at 02/25/16 0948  . enoxaparin (LOVENOX) injection 40 mg  40 mg Subcutaneous Q24H Gladstone Lighter, MD      . flecainide [facility]) tablet 100 mg  100 mg Oral BID Lance Coon, MD   100 mg at 02/25/16 0948  . furosemide (LASIX) 250 mg in dextrose 5 % 250 mL (1 mg/mL) infusion  4 mg/hr Intravenous Continuous Harmeet Singh, MD 4 mL/hr at 02/24/16 2308 4 mg/hr at 02/24/16 2308  . ipratropium-albuterol (DUONEB) 0.5-2.5 (3) MG/3ML nebulizer solution 3 mL  3 mL Nebulization Q6H PRN Gladstone Lighter, MD   3 mL at 02/24/16 2010  . lactulose (CHRONULAC) 10 GM/15ML solution 20 g  20 g Oral BID PRN Gladstone Lighter, MD   20 g at 02/23/16 0807  . levothyroxine (SYNTHROID, LEVOTHROID) tablet 75 mcg  75 mcg Oral QAC breakfast Lance Coon, MD   75 mcg at 02/25/16 0754  . LORazepam (ATIVAN) tablet 1 mg  1 mg Oral Q8H PRN Gladstone Lighter, MD   1 mg at 02/25/16 1234  . Melatonin TABS 5 mg  5 mg Oral QHS PRN Gladstone Lighter, MD      . morphine CONCENTRATE 10 MG/0.5ML oral solution 5 mg  5 mg Oral Q4H PRN Gladstone Lighter, MD   5 mg at 02/23/16 1628  . ondansetron (ZOFRAN) tablet 4 mg  4 mg Oral Q6H PRN Lance Coon, MD   4 mg at 02/16/16 2233   Or  . ondansetron [facility]) injection 4 mg  4 mg Intravenous Q6H PRN Lance Coon, MD   4 mg at  02/23/16 0807  . pantoprazole (PROTONIX) EC tablet 40 mg  40 mg Oral BID AC Srikar Sudini, MD   40 mg at 02/25/16 0754  . polyethylene glycol (MIRALAX / GLYCOLAX) packet 17 g  17 g Oral Daily Gladstone Lighter, MD   17 g at  02/25/16 0949  . potassium chloride SA (K-DUR,KLOR-CON) CR tablet 40 mEq  40 mEq Oral Daily Gladstone Lighter, MD   40 mEq at 02/25/16 1234  . predniSONE (DELTASONE) tablet 50 mg  50 mg Oral Q breakfast Gladstone Lighter, MD   50 mg at 02/25/16 0754  . senna-docusate (Senokot-S) tablet 2 tablet  2 tablet Oral BID Gladstone Lighter, MD   2 tablet at 02/25/16 0949  . sodium chloride flush (NS) 0.9 % injection 3 mL  3 mL Intravenous Q12H Lance Coon, MD   3 mL at 02/25/16 0951  . tiotropium (SPIRIVA) inhalation capsule 18 mcg  18 mcg Inhalation Daily Gladstone Lighter, MD   18 mcg at 02/25/16 0800  . traZODone (DESYREL) tablet 50 mg  50 mg Oral QHS Loletha Grayer, MD   50 mg at 02/24/16 2102    Musculoskeletal: Strength & Muscle Tone: decreased Gait & Station: unable to stand Patient leans: N/A  Psychiatric Specialty Exam: Physical Exam  Nursing note and vitals reviewed. Constitutional: She appears well-developed and well-nourished. She is cooperative. She has a sickly appearance.  HENT:  Head: Normocephalic and atraumatic.  Eyes: Conjunctivae are normal. Pupils are equal, round, and reactive to light.  Neck: Normal range of motion.  Cardiovascular: Normal heart sounds.   Respiratory: She is in respiratory distress.  GI: Soft.  Musculoskeletal: Normal range of motion.  Neurological: She is alert.  Skin: Skin is warm and dry.  Psychiatric: Her speech is normal and behavior is normal. Judgment and thought content normal. Her mood appears not anxious. She exhibits a depressed mood. She exhibits abnormal recent memory.    Review of Systems  Constitutional: Negative.   HENT: Negative.   Eyes: Negative.   Respiratory: Negative for shortness of breath.   Cardiovascular: Negative.   Gastrointestinal: Positive for heartburn and nausea.  Musculoskeletal: Negative.   Skin: Negative.   Neurological: Negative.   Psychiatric/Behavioral: Positive for depression and memory loss. Negative for  suicidal ideas, hallucinations and substance abuse. The patient has insomnia. The patient is not nervous/anxious.     Blood pressure 108/52, pulse 65, temperature 97.8 F (36.6 C), temperature source Oral, resp. rate 20, height 5' 2"  (1.575 m), weight 88.134 kg (194 lb 4.8 oz), SpO2 98 %.Body mass index is 35.53 kg/(m^2).  General Appearance: Casual  Eye Contact:  Good  Speech:  Slow  Volume:  Decreased  Mood:  Anxious and Dysphoric  Affect:  Congruent  Thought Process:  Goal Directed  Orientation:  Full (Time, Place, and Person)  Thought Content:  Logical  Suicidal Thoughts:  No  Homicidal Thoughts:  No  Memory:  Immediate;   Good Recent;   Poor Remote;   Fair  Judgement:  Fair  Insight:  Fair  Psychomotor Activity:  Decreased  Concentration:  Concentration: Fair  Recall:  [employer] of Knowledge:  Fair  Language:  Fair  Akathisia:  No  Handed:  Right  AIMS (if indicated):     Assets:  Desire for Improvement Financial Resources/Insurance Housing Resilience Social Support  ADL's:  Impaired  Cognition:  Impaired,  Mild  Sleep:        Treatment Plan Summary: Medication management and Plan Patient was  started on low-dose benzodiazepine to couple days ago to help with her acute anxiety around her respiratory failure. That seems to help without any problem side effects. This morning she was complaining of more depression and sadness. May be a transient thing. Last time we talk. I recommend that we try a low dose of citalopram which may help with depressive symptoms and also with these ongoing anxiety issues. Patient is agreeable to the plan. 10 mg of citalopram ordered. I will follow up with her she is here after the weekend.  Disposition: Patient does not meet criteria for psychiatric inpatient admission. Supportive therapy provided about ongoing stressors.  Alethia Berthold, MD 02/25/2016 4:59 PM

## 2016-02-25 NOTE — Progress Notes (Signed)
Pharmacy Note - Anticoagulation Monitoring  Patient on enoxaparin '30mg'$  SQ Q24H for VTE prophylaxis. Initiated for CrCl < 83m/min  Estimated Creatinine Clearance: 34.1 mL/min (by C-G formula based on Cr of 1.38)..  Will adjust to enoxaparin '40mg'$  SQ Q24H for CrCl > 335mmin per anticoagulation protocol  JoRexene EdisonPharmD Clinical Pharmacist  02/25/2016 9:05 AM

## 2016-02-25 NOTE — Plan of Care (Signed)
Problem: Safety: Goal: Ability to remain free from injury will improve Outcome: Progressing No injury reported or observed   

## 2016-02-25 NOTE — Care Management (Signed)
Per previous RNCM patient's home health agency will be [facility]. Per RN patient doesn't qualify for continuous O2 and that she did well with ambulation on Room Air.

## 2016-02-26 LAB — BASIC METABOLIC PANEL
ANION GAP: 9 (ref 5–15)
BUN: 71 mg/dL — ABNORMAL HIGH (ref 6–20)
CALCIUM: 7.7 mg/dL — AB (ref 8.9–10.3)
CO2: 33 mmol/L — ABNORMAL HIGH (ref 22–32)
CREATININE: 1.59 mg/dL — AB (ref 0.44–1.00)
Chloride: 94 mmol/L — ABNORMAL LOW (ref 101–111)
GFR calc non Af Amer: 30 mL/min — ABNORMAL LOW (ref >60)
GFR, EST AFRICAN AMERICAN: 35 mL/min — AB (ref >60)
GLUCOSE: 101 mg/dL — AB (ref 65–99)
Potassium: 3.5 mmol/L (ref 3.5–5.1)
Sodium: 136 mmol/L (ref 135–145)

## 2016-02-26 MED ORDER — CITALOPRAM HYDROBROMIDE 10 MG PO TABS: 10.0000 mg | ORAL_TABLET | Freq: Every day | ORAL | Status: DC

## 2016-02-26 MED ORDER — FUROSEMIDE 40 MG PO TABS: 40.0000 mg | ORAL_TABLET | Freq: Two times a day (BID) | ORAL | Status: DC

## 2016-02-26 MED ORDER — PREDNISONE 10 MG PO TABS: 10.0000 mg | ORAL_TABLET | Freq: Every day | ORAL | Status: DC

## 2016-02-26 NOTE — Discharge Summary (Signed)
[name]    MR#:  [id number]  DATE OF BIRTH:  12-Dec-1934  DATE OF ADMISSION:  02/16/2016 ADMITTING PHYSICIAN: Lance Coon, MD  DATE OF DISCHARGE: 02/26/2016 11:33 AM  PRIMARY CARE PHYSICIAN: Pcp Not In System    ADMISSION DIAGNOSIS:  COPD with exacerbation ([place]) [J44.1]  DISCHARGE DIAGNOSIS:  Principal Problem:   Adjustment disorder with anxiety Active Problems:   Atypical atrial flutter (HCC)   Chronic diastolic CHF (congestive heart failure) (HCC)   Adult hypothyroidism   Essential hypertension, benign   Chronic obstructive pulmonary disease with acute exacerbation (HCC)   Dyspepsia   Esophageal reflux   SECONDARY DIAGNOSIS:   Past Medical History  Diagnosis Date  . Cataract   . COPD (chronic obstructive pulmonary disease) ([place])   . Essential hypertension, benign   . Hypothyroidism   . Atypical atrial flutter ([place]) 11/16/2013    a. CHA2DS2VASc = 5-->prev on xarelto, d/c'd due to bleeding skin tear;  c. 11/2013->Placed on flecainide.  . Chronic diastolic heart failure ([place])   . PAF (paroxysmal atrial fibrillation) ([place])     a. 11/2013 Echo: EF 55-60%, mild AS/MR, mod dil LA/RA, PASP 22mHg;  b. CHA2DS2VASc = 5-->prev on xarelto, d/c'd due to bleeding skin tear;  c. 11/2013->Placed on flecainide.  . Lung nodule     Followed by Dr. FRaul Del . 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 [place] ([place]   . Collagen vascular disease ([place]   . Hypokalemia   . Chronic diastolic CHF (congestive heart failure) ([place]     a. 11/2013 Echo: EF 55-60%, mild AS/MR, mod dil LA/RA, PASP 387mg.    HOSPITAL COURSE:   [age]ear old female with a history of COPD on nocturnal oxygen and PAF who presented with shortness of breath. For further details please further H&P.  1. Acute on chronic COPD exacerbation: Patient is treated for COPD exacerbation with IV steroids, given his any inhalers.  Patient's symptoms. Patient will be discharged on by mouth prednisone taper. She completed course of azithromycin.  2. Acute on chronic diastolic heart failure: Echocardiogram showed ejection fraction of 60% with severe pulmonary hypertension. She was started on Lasix drip. She has improved significantly on this. She will be discharged with Lasix oral and will follow up with nephrology as an outpatient within 1 week.  3. Acute on chronic kidney failure stage III: Baseline creatinine is 1.2. Her acute kidney injury was due to problem #2. This is improved with Lasix.  4. Anxiety and depression: Patient was evaluated psychiatry while in the hospital.  5. PAF: Patient is on flecainide and Cardizem.    DISCHARGE CONDITIONS AND DIET:   [place] for discharge on a heart healthy diet  CONSULTS OBTAINED:  Treatment Team:  JoGonzella LexMD HaMurlean IbaMD  DRUG ALLERGIES:   Allergies  Allergen Reactions  . Belladonna Alkaloids Anaphylaxis and Other (See Comments)    Pt states that this medication makes her eyes cross.      DISCHARGE MEDICATIONS:   Discharge Medication List as of 02/26/2016 10:52 AM    START taking these medications   Details  alum & mag hydroxide-simeth (MAALOX/MYLANTA) 200-200-20 MG/5ML suspension Take 30 mLs by mouth every 6 (six) hours as needed for indigestion or heartburn., Starting 02/23/2016, Until Discontinued, Normal    citalopram (CELEXA) 10 MG tablet Take 1 tablet (10 mg total) by mouth daily., Starting 02/26/2016, Until  Discontinued, Normal    Morphine Sulfate (MORPHINE CONCENTRATE) 10 MG/0.5ML SOLN concentrated solution Take 0.25 mLs (5 mg total) by mouth every 6 (six) hours as needed for shortness of breath., Starting 02/23/2016, Until Discontinued, Print    senna-docusate (SENOKOT-S) 8.6-50 MG tablet Take 2 tablets by mouth 2 (two) times daily. Hold if >2 bowel movements/day, Starting 02/23/2016, Until Discontinued, Normal    tiotropium (SPIRIVA) 18 MCG  inhalation capsule Place 1 capsule (18 mcg total) into inhaler and inhale daily., Starting 02/23/2016, Until Discontinued, Normal      CONTINUE these medications which have CHANGED   Details  furosemide (LASIX) 40 MG tablet Take 1 tablet (40 mg total) by mouth 2 (two) times daily., Starting 02/26/2016, Until Discontinued, Normal    LORazepam (ATIVAN) 1 MG tablet Take 1 tablet (1 mg total) by mouth every 8 (eight) hours as needed for anxiety., Starting 02/23/2016, Until Discontinued, Print    predniSONE (DELTASONE) 10 MG tablet Take 1 tablet (10 mg total) by mouth daily with breakfast. 50 mg PO (ORAL)  x 2 days 40 mg PO (ORAL)  x 2 days 30 mg PO  (ORAL)  x 2 days 20 mg PO  (ORAL) x 2 days 10 mg PO  (ORAL) x 2 days then stop, Starting 02/26/2016, Until Discontinued, Print      CONTINUE these medications which have NOT CHANGED   Details  albuterol (PROVENTIL HFA;VENTOLIN HFA) 108 (90 Base) MCG/ACT inhaler Inhale 2 puffs into the lungs every 4 (four) hours as needed for wheezing or shortness of breath., Starting 02/16/2016, Until Discontinued, Normal    budesonide (PULMICORT) 0.5 MG/2ML nebulizer solution Take 2 mLs (0.5 mg total) by nebulization 2 (two) times daily., Starting 06/08/2015, Until Discontinued, Print    diltiazem (CARDIZEM CD) 300 MG 24 hr capsule Take 300 mg by mouth daily., Until Discontinued, Historical Med    flecainide (TAMBOCOR) 100 MG tablet Take 1 tablet (100 mg total) by mouth 2 (two) times daily., Starting 11/24/2013, Until Discontinued, Print    formoterol (PERFOROMIST) 20 MCG/2ML nebulizer solution Take 2 mLs (20 mcg total) by nebulization 2 (two) times daily., Starting 06/08/2015, Until Discontinued, Normal    ipratropium-albuterol (DUONEB) 0.5-2.5 (3) MG/3ML SOLN Take 3 mLs by nebulization every 4 (four) hours as needed (for wheezing/shortness of breath)., Until Discontinued, Historical Med    levothyroxine (SYNTHROID, LEVOTHROID) 75 MCG tablet Take 75 mcg by mouth daily  before breakfast., Until Discontinued, Historical Med    Melatonin 3 MG TABS Take 3 mg by mouth at bedtime as needed (for sleep)., Until Discontinued, Historical Med    omega-3 acid ethyl esters (LOVAZA) 1 g capsule Take 1 g by mouth daily., Until Discontinued, Historical Med    pantoprazole (PROTONIX) 40 MG tablet Take 1 tablet (40 mg total) by mouth daily., Starting 12/21/2015, Until Wed 12/20/16, Print    potassium chloride (K-DUR,KLOR-CON) 10 MEQ tablet Take 20 mEq by mouth daily., Until Discontinued, Historical Med      STOP taking these medications     levofloxacin (LEVAQUIN) 250 MG tablet      naproxen (NAPROSYN) 500 MG tablet      omeprazole (PRILOSEC) 20 MG capsule               Today   CHIEF COMPLAINT:   Patient doing well. Patient still has a cough however lower extremity edema has improved and no shortness of breath.   VITAL SIGNS:  Blood pressure 111/63, pulse 73, temperature 98 F (36.7 C), temperature source Oral,  resp. rate 18, height _0  (1.575 m), weight 85.004 kg (187 lb 6.4 oz), SpO2 100 %.   REVIEW OF SYSTEMS:  Review of Systems  Constitutional: Negative for fever, chills and malaise/fatigue.  HENT: Negative for ear discharge, ear pain, hearing loss, nosebleeds and sore throat.   Eyes: Negative for blurred vision and pain.  Respiratory: Positive for cough. Negative for hemoptysis, shortness of breath (improved) and wheezing.   Cardiovascular: Positive for leg swelling (improved). Negative for chest pain and palpitations.  Gastrointestinal: Negative for nausea, vomiting, abdominal pain, diarrhea and blood in stool.  Genitourinary: Negative for dysuria.  Musculoskeletal: Positive for neck pain. Negative for myalgias and back pain.  Neurological: Negative for dizziness, tremors, speech change, focal weakness, seizures and headaches.  Endo/Heme/Allergies: Does not bruise/bleed easily.  Psychiatric/Behavioral: Negative for depression, suicidal ideas  and hallucinations.     PHYSICAL EXAMINATION:  GENERAL:  81 y.o.-year-old patient lying in the bed with no acute distress.  NECK:  Supple, no jugular venous distention. No thyroid enlargement, no tenderness.  LUNGS: Bilateral rhonchi with very minimal wheezing,   No use of accessory muscles of respiration.  CARDIOVASCULAR: S1, S2 normal. No murmurs, rubs, or gallops.  ABDOMEN: Soft, non-tender, non-distended. Bowel sounds present. No organomegaly or mass.  EXTREMITIES: Minimal pedal edema, no cyanosis, or clubbing.  PSYCHIATRIC: The patient is alert and oriented x 3.  SKIN: No obvious rash, lesion, or ulcer.   DATA REVIEW:   CBC  Recent Labs Lab 02/25/16 0643  WBC 10.4  HGB 10.8*  HCT 32.9*  PLT 221    Chemistries   Recent Labs Lab 02/26/16 0310  NA 136  K 3.5  CL 94*  CO2 33*  GLUCOSE 101*  BUN 71*  CREATININE 1.59*  CALCIUM 7.7*    Cardiac Enzymes No results for input(s): TROPONINI in the last 168 hours.  Microbiology Results  _1 @  RADIOLOGY:  No results found.    Management plans discussed with the patient and she is in agreement. Stable for discharge home with [facility]  Patient should follow up with Dr Candiss Norse 1 week  CODE STATUS:     Code Status Orders        Start     Ordered   02/16/16 2128  Full code   Continuous     02/16/16 2127    Code Status History    Date Active Date Inactive Code Status Order ID Comments User Context   11/16/2013 12:33 AM 11/24/2013  6:53 PM Full Code [id number]  Angelica Ran, MD Inpatient      TOTAL TIME TAKING CARE OF THIS PATIENT: 36 minutes.    Note: This dictation was prepared with Dragon dictation along with smaller phrase technology. Any transcriptional errors that result from this process are unintentional.  Dysen Edmondson M.D on 02/26/2016 at 12:41 PM  Between 7am to 6pm - Pager - [phone] After 6pm go to [email or web address] - password [email or web address] [facility]  Office   [phone]  CC: Primary care physician; Dr. Murlean Iba

## 2016-02-26 NOTE — Consult Note (Signed)
[facility] Consult   Reason for Consult:  Follow-up  Referring Physician:  Tressia Miners Patient Identification: Toni [name] MRN:  [id number] Principal Diagnosis: Adjustment disorder with anxiety Diagnosis:   Patient Active Problem List   Diagnosis Date Noted  . Adjustment disorder with anxiety [F43.22] 02/23/2016  . Dyspepsia [R10.13]   . Esophageal reflux [K21.9]   . Encounter for anticoagulation discussion and counseling [Z71.89] 11/17/2015  . Fall [W19.XXXA] 11/17/2015  . Essential hypertension, benign [I10]   . Lung infiltrate on CT [R91.8] 06/08/2015  . Chronic diastolic CHF (congestive heart failure) ([place]) [I50.32] 05/17/2015  . Insomnia [G47.00] 04/17/2014  . Squamous cell lung cancer ([place]) [C34.90] 03/27/2014  . Postnasal drip [R09.82] 03/05/2014  . Hyperlipidemia [E78.5] 12/04/2013  . Leg edema [R60.0] 12/04/2013  . Atypical atrial flutter ([place]) [I48.4] 11/16/2013  . COPD (chronic obstructive pulmonary disease) ([place]) [J44.9] 11/15/2013  . Chronic kidney disease [N18.9] 08/24/2013  . Essential (primary) hypertension [I10] 08/24/2013  . Adult hypothyroidism [E03.9] 08/24/2013  . Acute exacerbation of chronic obstructive airways disease ([place]) [J44.1] 08/24/2013  . Chronic obstructive pulmonary disease with acute exacerbation (HCC) [J44.1] 08/24/2013    Total Time spent with patient: 1 hour  Subjective:   Toni [name] is a 81 y.o. female patient admitted with "I'm having some trouble catching my breath".   Patient was seen for follow-up in the presence of her husband. She was admitted due to shortness of breath and was complaining of frequent burping and GI discomfort. She has history of anxiety. Patient reported that she is currently feeling better since she was started on the medication yesterday. 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 She reported that she does not want to follow with a psychiatrist as an outpatient and will  continue to see her primary care physician in [place]. She appeared calm and collected during the interview. She currently denied having any suicidal ideations or plans. She denied having any perceptual disturbances. She is ready to be discharged this morning.    Medical history: Patient has past history of lung cancer, history of chronic COPD, atrial flutter hypertension esophageal reflux. Multiple medical problems.  Social history: Lives with her husband. 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 He enjoys her work. Has 3 adult sons. Good relationship with the whole family.  Substance abuse history: Does not drink. Has never had a drinking problem. Denies any substance abuse. No evidence of problems with abuse of prescription medicine.  Past Psychiatric History: Patient has not been treated for mental health or psychiatric problems in the past. Does not think of herself as having a chronic anxiety disorder. Never been in a psychiatric hospital no history of suicidality no history of psychosis. She does report that when she's been given corticosteroids for her lung problems in the past they have made her feel jittery.  Risk to Self: Is patient at risk for suicide?: No Risk to Others:   Prior Inpatient Therapy:   Prior Outpatient Therapy:    Past Medical History:  Past Medical History  Diagnosis Date  . Cataract   . COPD (chronic obstructive pulmonary disease) ([place])   . Essential hypertension, benign   . Hypothyroidism   . Atypical atrial flutter ([place]) 11/16/2013    a. CHA2DS2VASc = 5-->prev on xarelto, d/c'd due to bleeding skin tear;  c. 11/2013->Placed on flecainide.  . Chronic diastolic heart  failure ([place])   . PAF (paroxysmal atrial fibrillation) ([place])     a. 11/2013 Echo: EF 55-60%, mild AS/MR, mod dil LA/RA, PASP 60mHg;  b. CHA2DS2VASc = 5-->prev on xarelto, d/c'd due to bleeding  skin tear;  c. 11/2013->Placed on flecainide.  . Lung nodule     Followed by Dr. FRaul Del . 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 Lung cancer ([place]   . Collagen vascular disease ([place]   . Hypokalemia   . Chronic diastolic CHF (congestive heart failure) ([place]     a. 11/2013 Echo: EF 55-60%, mild AS/MR, mod dil LA/RA, PASP 346mg.    Past Surgical History  Procedure Laterality Date  . Eye surgery    . Total knee arthroplasty Bilateral    Family History:  Family History  Problem Relation Age of Onset  . CAD Father   . Heart disease Father   . Heart attack Father   . Arrhythmia Sister    Family Psychiatric  History: Sounds she had a sister with some kind of mental health problems but otherwise no family history identified Social History:  History  Alcohol Use No     History  Drug Use No    Social History   Social History  . Marital Status: Married    Spouse Name: N/A  . Number of Children: N/A  . Years of Education: N/A   Social History Main Topics  . Smoking status: Former Smoker -- 1.50 packs/day for 50 years    Types: Cigarettes    Quit date: 09/18/2002  . Smokeless tobacco: Never Used  . Alcohol Use: No  . Drug Use: No  . Sexual Activity: Not Asked   Other Topics Concern  . None   Social History Narrative   Additional Social History:    Allergies:   Allergies  Allergen Reactions  . Belladonna Alkaloids Anaphylaxis and Other (See Comments)    Pt states that this medication makes her eyes cross.      Labs:  Results for orders placed or performed during the hospital encounter of 02/16/16 (from the past 48 hour(s))  CBC     Status: Abnormal   Collection Time: 02/25/16  6:43 AM  Result Value Ref Range   WBC 10.4 3.6 - 11.0 K/uL   RBC 3.74 (L) 3.80 - 5.20 MIL/uL   Hemoglobin 10.8 (L) 12.0 - 16.0 g/dL   HCT 32.9 (L) 35.0 - 47.0 %   MCV 88.0 80.0 - 100.0 fL   MCH 28.9 26.0 - 34.0 pg   MCHC 32.9 32.0 - 36.0 g/dL   RDW 16.5 (H)  11.5 - 14.5 %   Platelets 221 150 - 440 K/uL  Basic metabolic panel     Status: Abnormal   Collection Time: 02/25/16  6:43 AM  Result Value Ref Range   Sodium 138 135 - 145 mmol/L   Potassium 3.4 (L) 3.5 - 5.1 mmol/L   Chloride 93 (L) 101 - 111 mmol/L   CO2 35 (H) 22 - 32 mmol/L   Glucose, Bld 94 65 - 99 mg/dL   BUN 69 (H) 6 - 20 mg/dL   Creatinine, Ser 1.38 (H) 0.44 - 1.00 mg/dL   Calcium 7.8 (L) 8.9 - 10.3 mg/dL   GFR calc non Af Amer 35 (L) >60 mL/min   GFR calc Af Amer 41 (L) >60 mL/min    Comment: (NOTE) The eGFR has been calculated using the CKD EPI equation. This calculation has not been  validated in all clinical situations. eGFR's persistently <60 mL/min signify possible Chronic Kidney Disease.    Anion gap 10 5 - 15  Basic metabolic panel     Status: Abnormal   Collection Time: 02/26/16  3:10 AM  Result Value Ref Range   Sodium 136 135 - 145 mmol/L   Potassium 3.5 3.5 - 5.1 mmol/L   Chloride 94 (L) 101 - 111 mmol/L   CO2 33 (H) 22 - 32 mmol/L   Glucose, Bld 101 (H) 65 - 99 mg/dL   BUN 71 (H) 6 - 20 mg/dL   Creatinine, Ser 1.59 (H) 0.44 - 1.00 mg/dL   Calcium 7.7 (L) 8.9 - 10.3 mg/dL   GFR calc non Af Amer 30 (L) >60 mL/min   GFR calc Af Amer 35 (L) >60 mL/min    Comment: (NOTE) The eGFR has been calculated using the CKD EPI equation. This calculation has not been validated in all clinical situations. eGFR's persistently <60 mL/min signify possible Chronic Kidney Disease.    Anion gap 9 5 - 15    Current Facility-Administered Medications  Medication Dose Route Frequency Provider Last Rate Last Dose  . acetaminophen (TYLENOL) tablet 650 mg  650 mg Oral Q6H PRN Lance Coon, MD   650 mg at 02/25/16 0754   Or  . acetaminophen (TYLENOL) suppository 650 mg  650 mg Rectal Q6H PRN Lance Coon, MD      . arformoterol [facility]) nebulizer solution 15 mcg  15 mcg Nebulization Q12H Lance Coon, MD   15 mcg at 02/26/16 0748  . budesonide (PULMICORT) nebulizer solution  0.5 mg  0.5 mg Nebulization BID Lance Coon, MD   0.5 mg at 02/26/16 0748  . citalopram (CELEXA) tablet 10 mg  10 mg Oral Daily Gonzella Lex, MD   10 mg at 02/26/16 0937  . clonazePAM (KLONOPIN) tablet 0.5 mg  0.5 mg Oral BID Gonzella Lex, MD   0.5 mg at 02/26/16 0981  . diltiazem (CARDIZEM CD) 24 hr capsule 300 mg  300 mg Oral Daily Lance Coon, MD   300 mg at 02/26/16 1914  . enoxaparin (LOVENOX) injection 40 mg  40 mg Subcutaneous Q24H Gladstone Lighter, MD   40 mg at 02/25/16 1756  . flecainide (TAMBOCOR) tablet 100 mg  100 mg Oral BID Lance Coon, MD   100 mg at 02/26/16 7829  . furosemide (LASIX) 250 mg in dextrose 5 % 250 mL (1 mg/mL) infusion  4 mg/hr Intravenous Continuous Murlean Iba, MD 4 mL/hr at 02/24/16 2308 4 mg/hr at 02/24/16 2308  . ipratropium-albuterol (DUONEB) 0.5-2.5 (3) MG/3ML nebulizer solution 3 mL  3 mL Nebulization Q6H PRN Gladstone Lighter, MD   3 mL at 02/24/16 2010  . lactulose (CHRONULAC) 10 GM/15ML solution 20 g  20 g Oral BID PRN Gladstone Lighter, MD   20 g at 02/23/16 0807  . levothyroxine (SYNTHROID, LEVOTHROID) tablet 75 mcg  75 mcg Oral QAC breakfast Lance Coon, MD   75 mcg at 02/26/16 0818  . LORazepam (ATIVAN) tablet 1 mg  1 mg Oral Q8H PRN Gladstone Lighter, MD   1 mg at 02/25/16 1234  . Melatonin TABS 5 mg  5 mg Oral QHS PRN Gladstone Lighter, MD      . morphine CONCENTRATE 10 MG/0.5ML oral solution 5 mg  5 mg Oral Q4H PRN Gladstone Lighter, MD   5 mg at 02/23/16 1628  . ondansetron (ZOFRAN) tablet 4 mg  4 mg Oral Q6H PRN Lance Coon, MD  4 mg at 02/16/16 2233   Or  . ondansetron [facility]) injection 4 mg  4 mg Intravenous Q6H PRN Lance Coon, MD   4 mg at 02/23/16 8916  . pantoprazole (PROTONIX) EC tablet 40 mg  40 mg Oral BID AC Srikar Sudini, MD   40 mg at 02/26/16 0818  . polyethylene glycol (MIRALAX / GLYCOLAX) packet 17 g  17 g Oral Daily Gladstone Lighter, MD   17 g at 02/26/16 0937  . predniSONE (DELTASONE) tablet 50 mg  50 mg Oral Q  breakfast Gladstone Lighter, MD   50 mg at 02/26/16 0818  . senna-docusate (Senokot-S) tablet 2 tablet  2 tablet Oral BID Gladstone Lighter, MD   2 tablet at 02/26/16 0937  . sodium chloride flush (NS) 0.9 % injection 3 mL  3 mL Intravenous Q12H Lance Coon, MD   3 mL at 02/25/16 0951  . tiotropium (SPIRIVA) inhalation capsule 18 mcg  18 mcg Inhalation Daily Gladstone Lighter, MD   18 mcg at 02/26/16 0819  . traZODone (DESYREL) tablet 50 mg  50 mg Oral QHS Loletha Grayer, MD   50 mg at 02/25/16 2123    Musculoskeletal: Strength & Muscle Tone: decreased Gait & Station: unable to stand Patient leans: N/A  Psychiatric Specialty Exam: Physical Exam  Nursing note and vitals reviewed. Constitutional: She appears well-developed and well-nourished. She is cooperative. She has a sickly appearance.  HENT:  Head: Normocephalic and atraumatic.  Eyes: Conjunctivae are normal. Pupils are equal, round, and reactive to light.  Neck: Normal range of motion.  Cardiovascular: Normal heart sounds.   Respiratory: She is in respiratory distress.  GI: Soft.  Musculoskeletal: Normal range of motion.  Neurological: She is alert.  Skin: Skin is warm and dry.  Psychiatric: Her speech is normal and behavior is normal. Judgment and thought content normal. Her mood appears not anxious. She exhibits a depressed mood. She exhibits abnormal recent memory.    Review of Systems  Constitutional: Negative.   HENT: Negative.   Eyes: Negative.   Respiratory: Negative for shortness of breath.   Cardiovascular: Negative.   Gastrointestinal: Positive for heartburn and nausea.  Musculoskeletal: Negative.   Skin: Negative.   Neurological: Negative.   Psychiatric/Behavioral: Positive for depression and memory loss. Negative for suicidal ideas, hallucinations and substance abuse. The patient has insomnia. The patient is not nervous/anxious.     Blood pressure 111/63, pulse 73, temperature 98 F (36.7 C), temperature  source Oral, resp. rate 18, height _0  (1.575 m), weight 187 lb 6.4 oz (85.004 kg), SpO2 100 %.Body mass index is 34.27 kg/(m^2).  General Appearance: Casual  Eye Contact:  Good  Speech:  Slow  Volume:  Decreased  Mood:  Anxious  Affect:  Congruent  Thought Process:  Goal Directed  Orientation:  Full (Time, Place, and Person)  Thought Content:  Logical  Suicidal Thoughts:  No  Homicidal Thoughts:  No  Memory:  Immediate;   Good Recent;   Poor Remote;   Fair  Judgement:  Fair  Insight:  Fair  Psychomotor Activity:  Decreased  Concentration:  Concentration: Fair  Recall:  [employer] of Knowledge:  Fair  Language:  Fair  Akathisia:  No  Handed:  Right  AIMS (if indicated):     Assets:  Desire for Improvement Financial Resources/Insurance Housing Resilience Social Support  ADL's:  Impaired  Cognition:  Impaired,  Mild  Sleep:        Treatment Plan Summary: Medication management  Continue Celexa 10 mg daily. Discussed the patient and her husband at length about the medications treatment risks benefits and alternatives. She reported that she will continue to follow up with her primary care physician on a regular basis.  Thank you for allowing me to participate in the care of this patient  Disposition: Patient does not meet criteria for psychiatric inpatient admission. Supportive therapy provided about ongoing stressors.  Rainey Pines, MD 02/26/2016 10:46 AM

## 2016-02-26 NOTE — Progress Notes (Signed)
Subjective:   Patient is doing a little better today.  Weight this morning 85 kg Breathing is a little better Serum creatinine improved to 1.6  Objective:  Vital signs in last 24 hours:  Temp:  [97.8 F (36.6 C)-98 F (36.7 C)] 98 F (36.7 C) (06/10 0805) Pulse Rate:  [65-73] 73 (06/10 0805) Resp:  [18] 18 (06/10 0805) BP: (108-121)/(52-63) 111/63 mmHg (06/10 0805) SpO2:  [95 %-100 %] 100 % (06/10 0805) Weight:  [85.004 kg (187 lb 6.4 oz)] 85.004 kg (187 lb 6.4 oz) (06/10 0516)  Weight change: 0 kg (0 lb) Filed Weights   02/24/16 0739 02/25/16 0500 02/26/16 0516  Weight: 85.004 kg (187 lb 6.4 oz) 88.134 kg (194 lb 4.8 oz) 85.004 kg (187 lb 6.4 oz)    Intake/Output:    Intake/Output Summary (Last 24 hours) at 02/26/16 1042 Last data filed at 02/26/16 0800  Gross per 24 hour  Intake 919.33 ml  Output   1550 ml  Net -630.67 ml     Physical Exam: General: No acute distress, sitting up in chair  HEENT Anicteric, moist mucous membranes, decreased hearing  Neck supple  Pulm/lungs Bilateral fine crackles at bases  CVS/Heart 2/ 6 systolic murmur, regular  Abdomen:  Soft, nontender, nondistended  Extremities: + dependent edema  Neurologic: Alert, able to answer questions and follow commands  Skin: No acute rashes          Basic Metabolic Panel:   Recent Labs Lab 02/20/16 0523 02/22/16 0505 02/23/16 0512 02/25/16 0643 02/26/16 0310  NA 138 137 138 138 136  K 4.2 4.7 4.7 3.4* 3.5  CL 99* 98* 100* 93* 94*  CO2 '28 29 29 '$ 35* 33*  GLUCOSE 142* 151* 130* 94 101*  BUN 77* 86* 85* 69* 71*  CREATININE 1.94* 1.90* 1.86* 1.38* 1.59*  CALCIUM 8.8* 8.5* 8.6* 7.8* 7.7*     CBC:  Recent Labs Lab 02/25/16 0643  WBC 10.4  HGB 10.8*  HCT 32.9*  MCV 88.0  PLT 221      Microbiology:  No results found for this or any previous visit (from the past 720 hour(s)).  Coagulation Studies: No results for input(s): LABPROT, INR in the last 72  hours.  Urinalysis: No results for input(s): COLORURINE, LABSPEC, PHURINE, GLUCOSEU, HGBUR, BILIRUBINUR, KETONESUR, PROTEINUR, UROBILINOGEN, NITRITE, LEUKOCYTESUR in the last 72 hours.  Invalid input(s): APPERANCEUR    Imaging: No results found.   Medications:   . furosemide (LASIX) infusion 4 mg/hr (02/24/16 2308)   . arformoterol  15 mcg Nebulization Q12H  . budesonide  0.5 mg Nebulization BID  . citalopram  10 mg Oral Daily  . clonazePAM  0.5 mg Oral BID  . diltiazem  300 mg Oral Daily  . enoxaparin (LOVENOX) injection  40 mg Subcutaneous Q24H  . flecainide  100 mg Oral BID  . levothyroxine  75 mcg Oral QAC breakfast  . pantoprazole  40 mg Oral BID AC  . polyethylene glycol  17 g Oral Daily  . predniSONE  50 mg Oral Q breakfast  . senna-docusate  2 tablet Oral BID  . sodium chloride flush  3 mL Intravenous Q12H  . tiotropium  18 mcg Inhalation Daily  . traZODone  50 mg Oral QHS   acetaminophen **OR** acetaminophen, ipratropium-albuterol, lactulose, LORazepam, Melatonin, morphine CONCENTRATE, ondansetron **OR** ondansetron (ZOFRAN) IV  Assessment/ Plan:  81 y.o. female with a PMHX of COPD requiring home oxygen at night, hypothyroidism, hypertension, atrial fibrillation and flutter, chronic diastolic heart failure,  lung nodule, history of lung cancer treated with radiation, who was admitted to [facility] on 02/16/2016 for evaluation of shortness of breath.   1. Acute renal failure on chronic kidney disease stage III Baseline creatinine 1.12/GFR 46 from April 2017 Worsening of creatinine this admission is likely related to cardiorenal volume shifts  2. Shortness of breath Likely combination of COPD exacerbation and Acute Pulmonary edema Patient is getting bronchodilators, nebulizers and oral steroids  - Lasix 40 BID as outpatient, will adjust from there  3. peripheral edema Plan as above for Lasix   LOS: 10 Yurani Fettes 6/10/201710:42 AM

## 2016-04-06 ENCOUNTER — Ambulatory Visit
Admission: RE | Admit: 2016-04-06 | Discharge: 2016-04-06 | Disposition: A | Discharge status: Home / Self Care | Payer: Medicare Other | Source: Ambulatory Visit | Attending: Oncology | Admitting: Oncology

## 2016-04-06 ENCOUNTER — Inpatient Hospital Stay: Payer: Medicare Other | Attending: Oncology | Admitting: Oncology

## 2016-04-06 ENCOUNTER — Inpatient Hospital Stay: Payer: Medicare Other

## 2016-04-06 ENCOUNTER — Ambulatory Visit
Admission: RE | Admit: 2016-04-06 | Discharge: 2016-04-06 | Disposition: A | Discharge status: Home / Self Care | Payer: Medicare Other | Source: Ambulatory Visit | Attending: Pulmonary Disease | Admitting: Pulmonary Disease

## 2016-04-06 VITALS — BP 152/66 | HR 73 | Temp 97.1°F

## 2016-04-06 DIAGNOSIS — I10 Essential (primary) hypertension: Secondary | ICD-10-CM | POA: Diagnosis not present

## 2016-04-06 DIAGNOSIS — J441 Chronic obstructive pulmonary disease with (acute) exacerbation: Secondary | ICD-10-CM

## 2016-04-06 DIAGNOSIS — I129 Hypertensive chronic kidney disease with stage 1 through stage 4 chronic kidney disease, or unspecified chronic kidney disease: Secondary | ICD-10-CM | POA: Diagnosis not present

## 2016-04-06 DIAGNOSIS — Z923 Personal history of irradiation: Secondary | ICD-10-CM | POA: Insufficient documentation

## 2016-04-06 DIAGNOSIS — R531 Weakness: Secondary | ICD-10-CM | POA: Diagnosis not present

## 2016-04-06 DIAGNOSIS — Z87891 Personal history of nicotine dependence: Secondary | ICD-10-CM | POA: Diagnosis not present

## 2016-04-06 DIAGNOSIS — N182 Chronic kidney disease, stage 2 (mild): Secondary | ICD-10-CM | POA: Diagnosis not present

## 2016-04-06 DIAGNOSIS — Z79899 Other long term (current) drug therapy: Secondary | ICD-10-CM | POA: Diagnosis not present

## 2016-04-06 DIAGNOSIS — C3490 Malignant neoplasm of unspecified part of unspecified bronchus or lung: Secondary | ICD-10-CM

## 2016-04-06 DIAGNOSIS — I480 Paroxysmal atrial fibrillation: Secondary | ICD-10-CM | POA: Insufficient documentation

## 2016-04-06 DIAGNOSIS — I700 Atherosclerosis of aorta: Secondary | ICD-10-CM | POA: Diagnosis not present

## 2016-04-06 DIAGNOSIS — R5383 Other fatigue: Secondary | ICD-10-CM | POA: Diagnosis not present

## 2016-04-06 DIAGNOSIS — E876 Hypokalemia: Secondary | ICD-10-CM | POA: Diagnosis not present

## 2016-04-06 DIAGNOSIS — J449 Chronic obstructive pulmonary disease, unspecified: Secondary | ICD-10-CM | POA: Diagnosis not present

## 2016-04-06 DIAGNOSIS — I484 Atypical atrial flutter: Secondary | ICD-10-CM | POA: Diagnosis not present

## 2016-04-06 DIAGNOSIS — C3412 Malignant neoplasm of upper lobe, left bronchus or lung: Secondary | ICD-10-CM | POA: Insufficient documentation

## 2016-04-06 DIAGNOSIS — E786 Lipoprotein deficiency: Secondary | ICD-10-CM

## 2016-04-06 DIAGNOSIS — I998 Other disorder of circulatory system: Secondary | ICD-10-CM | POA: Diagnosis not present

## 2016-04-06 DIAGNOSIS — E039 Hypothyroidism, unspecified: Secondary | ICD-10-CM | POA: Diagnosis not present

## 2016-04-06 DIAGNOSIS — I5032 Chronic diastolic (congestive) heart failure: Secondary | ICD-10-CM | POA: Insufficient documentation

## 2016-04-06 LAB — CBC WITH DIFFERENTIAL/PLATELET
BASOS ABS: 0 10*3/uL (ref 0–0.1)
BASOS PCT: 0 %
Eosinophils Absolute: 0.3 10*3/uL (ref 0–0.7)
Eosinophils Relative: 4 %
HEMATOCRIT: 34.2 % — AB (ref 35.0–47.0)
HEMOGLOBIN: 11.4 g/dL — AB (ref 12.0–16.0)
LYMPHS PCT: 26 %
Lymphs Abs: 1.7 10*3/uL (ref 1.0–3.6)
MCH: 29.9 pg (ref 26.0–34.0)
MCHC: 33.2 g/dL (ref 32.0–36.0)
MCV: 90.2 fL (ref 80.0–100.0)
MONOS PCT: 10 %
Monocytes Absolute: 0.7 10*3/uL (ref 0.2–0.9)
Neutro Abs: 3.9 10*3/uL (ref 1.4–6.5)
Neutrophils Relative %: 60 %
Platelets: 298 10*3/uL (ref 150–440)
RBC: 3.8 MIL/uL (ref 3.80–5.20)
RDW: 16.6 % — ABNORMAL HIGH (ref 11.5–14.5)
WBC: 6.5 10*3/uL (ref 3.6–11.0)

## 2016-04-06 LAB — COMPREHENSIVE METABOLIC PANEL
ALBUMIN: 4.1 g/dL (ref 3.5–5.0)
ALK PHOS: 76 U/L (ref 38–126)
ALT: 11 U/L — ABNORMAL LOW (ref 14–54)
AST: 15 U/L (ref 15–41)
Anion gap: 8 (ref 5–15)
BILIRUBIN TOTAL: 0.5 mg/dL (ref 0.3–1.2)
BUN: 25 mg/dL — AB (ref 6–20)
CALCIUM: 8.8 mg/dL — AB (ref 8.9–10.3)
CO2: 30 mmol/L (ref 22–32)
CREATININE: 1.27 mg/dL — AB (ref 0.44–1.00)
Chloride: 101 mmol/L (ref 101–111)
GFR calc Af Amer: 45 mL/min — ABNORMAL LOW (ref >60)
GFR calc non Af Amer: 39 mL/min — ABNORMAL LOW (ref >60)
GLUCOSE: 101 mg/dL — AB (ref 65–99)
Potassium: 3.8 mmol/L (ref 3.5–5.1)
Sodium: 139 mmol/L (ref 135–145)
TOTAL PROTEIN: 6.9 g/dL (ref 6.5–8.1)

## 2016-04-06 NOTE — Progress Notes (Signed)
Patient here for follow up. Wanting results from exray.

## 2016-04-06 NOTE — Progress Notes (Signed)
Toni [name]  [phone]  Fax:([phone]) [place] DOB: [birth year]  MR#: [id number]  ALP#:[id number]  Patient Care Team: Pcp Not In System as PCP - General Minna Merritts, MD as Consulting Physician (Cardiology)  CHIEF COMPLAINT:  Chief Complaint  Patient presents with  . Lung Cancer  . Follow-up    INTERVAL HISTORY: Patient returns to clinic for 6 month follow up of lung cancer. She was recently hospitalized for COPD exacerbation. She feels well today. She has chronic shortness of breath and cough but unchanged. She denies chest pain. Her appetite is good and she denies weight loss.    REVIEW OF SYSTEMS:   Review of Systems  Constitutional: Positive for malaise/fatigue.  HENT: Negative.   Eyes: Negative.   Respiratory: Positive for cough and shortness of breath.   Cardiovascular: Negative.   Gastrointestinal: Negative.   Genitourinary: Negative.   Musculoskeletal: Negative.   Skin: Negative.   Neurological: Positive for weakness.  Psychiatric/Behavioral: Negative.     As per HPI. Otherwise, a complete review of systems is negatve.  ONCOLOGY HISTORY:  No history exists.    PAST MEDICAL HISTORY: Past Medical History  Diagnosis Date  . Cataract   . COPD (chronic obstructive pulmonary disease) ([place])   . Essential hypertension, benign   . Hypothyroidism   . Atypical atrial flutter ([place]) 11/16/2013    a. CHA2DS2VASc = 5-->prev on xarelto, d/c'd due to bleeding skin tear;  c. 11/2013->Placed on flecainide.  . Chronic diastolic heart failure ([place])   . PAF (paroxysmal atrial fibrillation) ([place])     a. 11/2013 Echo: EF 55-60%, mild AS/MR, mod dil LA/RA, PASP 63mHg;  b. CHA2DS2VASc = 5-->prev on xarelto, d/c'd due to bleeding skin tear;  c. 11/2013->Placed on flecainide.  . Lung nodule     Followed by Dr. FRaul Del . 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 Lung cancer ([place]   . Collagen vascular disease ([place]     . Hypokalemia   . Chronic diastolic CHF (congestive heart failure) ([place]     a. 11/2013 Echo: EF 55-60%, mild AS/MR, mod dil LA/RA, PASP 341mg.    PAST SURGICAL HISTORY: Past Surgical History  Procedure Laterality Date  . Eye surgery    . Total knee arthroplasty Bilateral     FAMILY HISTORY Family History  Problem Relation Age of Onset  . CAD Father   . Heart disease Father   . Heart attack Father   . Arrhythmia Sister     GYNECOLOGIC HISTORY:  No LMP recorded. Patient is postmenopausal.     ADVANCED DIRECTIVES:    HEALTH MAINTENANCE: Social History  Substance Use Topics  . Smoking status: Former Smoker -- 1.50 packs/day for 50 years    Types: Cigarettes    Quit date: 09/18/2002  . Smokeless tobacco: Never Used  . Alcohol Use: No    Allergies  Allergen Reactions  . Belladonna Alkaloids Anaphylaxis and Other (See Comments)    Pt states that this medication makes her eyes cross.      Current Outpatient Prescriptions  Medication Sig Dispense Refill  . albuterol (PROVENTIL HFA;VENTOLIN HFA) 108 (90 Base) MCG/ACT inhaler Inhale 2 puffs into the lungs every 4 (four) hours as needed for wheezing or shortness of breath. 1 Inhaler 12  . budesonide (PULMICORT) 0.5 MG/2ML nebulizer solution Take 2 mLs (0.5 mg total) by nebulization 2 (two) times daily. 120 mL 11  . diltiazem (CARDIZEM CD)  300 MG 24 hr capsule Take 300 mg by mouth daily.    . flecainide (TAMBOCOR) 100 MG tablet Take 1 tablet (100 mg total) by mouth 2 (two) times daily. 60 tablet 11  . formoterol (PERFOROMIST) 20 MCG/2ML nebulizer solution Take 2 mLs (20 mcg total) by nebulization 2 (two) times daily. 2 mL 12  . furosemide (LASIX) 40 MG tablet Take 1 tablet (40 mg total) by mouth 2 (two) times daily. 60 tablet 0  . ipratropium-albuterol (DUONEB) 0.5-2.5 (3) MG/3ML SOLN Take 3 mLs by nebulization every 4 (four) hours as needed (for wheezing/shortness of breath).    Marland Kitchen levothyroxine (SYNTHROID, LEVOTHROID) 75  MCG tablet Take 75 mcg by mouth daily before breakfast.    . omega-3 acid ethyl esters (LOVAZA) 1 g capsule Take 1 g by mouth daily.    . potassium chloride (K-DUR,KLOR-CON) 10 MEQ tablet Take 20 mEq by mouth daily.    . predniSONE (DELTASONE) 10 MG tablet Take 1 tablet (10 mg total) by mouth daily with breakfast. 50 mg PO (ORAL)  x 2 days 40 mg PO (ORAL)  x 2 days 30 mg PO  (ORAL)  x 2 days 20 mg PO  (ORAL) x 2 days 10 mg PO  (ORAL) x 2 days then stop 30 tablet 0  . senna-docusate (SENOKOT-S) 8.6-50 MG tablet Take 2 tablets by mouth 2 (two) times daily. Hold if >2 bowel movements/day 60 tablet 2  . tiotropium (SPIRIVA) 18 MCG inhalation capsule Place 1 capsule (18 mcg total) into inhaler and inhale daily. 30 capsule 12   No current facility-administered medications for this visit.    OBJECTIVE: There were no vitals taken for this visit.   There is no weight on file to calculate BMI.    ECOG FS:1 - Symptomatic but completely ambulatory  General: Well-developed, well-nourished, no acute distress. Eyes: Pink conjunctiva, anicteric sclera. HEENT: Normocephalic, moist mucous membranes, clear oropharnyx. Lungs: Diminished sounds, no wheezing Heart: Irregular Abdomen: Soft, nontender, nondistended.  Musculoskeletal: No edema, cyanosis, or clubbing. Neuro: Alert, answering all questions appropriately.  Skin: No rashes or petechiae noted. Psych: Normal affect. Lymphatics: No cervical, calvicular, axillary LAD.   LAB RESULTS:  Appointment on 04/06/2016  Component Date Value Ref Range Status  . WBC 04/06/2016 6.5  3.6 - 11.0 K/uL Final  . RBC 04/06/2016 3.80  3.80 - 5.20 MIL/uL Final  . Hemoglobin 04/06/2016 11.4* 12.0 - 16.0 g/dL Final  . HCT 04/06/2016 34.2* 35.0 - 47.0 % Final  . MCV 04/06/2016 90.2  80.0 - 100.0 fL Final  . MCH 04/06/2016 29.9  26.0 - 34.0 pg Final  . MCHC 04/06/2016 33.2  32.0 - 36.0 g/dL Final  . RDW 04/06/2016 16.6* 11.5 - 14.5 % Final  . Platelets 04/06/2016  298  150 - 440 K/uL Final  . Neutrophils Relative % 04/06/2016 60   Final  . Neutro Abs 04/06/2016 3.9  1.4 - 6.5 K/uL Final  . Lymphocytes Relative 04/06/2016 26   Final  . Lymphs Abs 04/06/2016 1.7  1.0 - 3.6 K/uL Final  . Monocytes Relative 04/06/2016 10   Final  . Monocytes Absolute 04/06/2016 0.7  0.2 - 0.9 K/uL Final  . Eosinophils Relative 04/06/2016 4   Final  . Eosinophils Absolute 04/06/2016 0.3  0 - 0.7 K/uL Final  . Basophils Relative 04/06/2016 0   Final  . Basophils Absolute 04/06/2016 0.0  0 - 0.1 K/uL Final  . Sodium 04/06/2016 139  135 - 145 mmol/L Final  .  Potassium 04/06/2016 3.8  3.5 - 5.1 mmol/L Final  . Chloride 04/06/2016 101  101 - 111 mmol/L Final  . CO2 04/06/2016 30  22 - 32 mmol/L Final  . Glucose, Bld 04/06/2016 101* 65 - 99 mg/dL Final  . BUN 04/06/2016 25* 6 - 20 mg/dL Final  . Creatinine, Ser 04/06/2016 1.27* 0.44 - 1.00 mg/dL Final  . Calcium 04/06/2016 8.8* 8.9 - 10.3 mg/dL Final  . Total Protein 04/06/2016 6.9  6.5 - 8.1 g/dL Final  . Albumin 04/06/2016 4.1  3.5 - 5.0 g/dL Final  . AST 04/06/2016 15  15 - 41 U/L Final  . ALT 04/06/2016 11* 14 - 54 U/L Final  . Alkaline Phosphatase 04/06/2016 76  38 - 126 U/L Final  . Total Bilirubin 04/06/2016 0.5  0.3 - 1.2 mg/dL Final  . GFR calc non Af Amer 04/06/2016 39* >60 mL/min Final  . GFR calc Af Amer 04/06/2016 45* >60 mL/min Final   Comment: (NOTE) The eGFR has been calculated using the CKD EPI equation. This calculation has not been validated in all clinical situations. eGFR's persistently <60 mL/min signify possible Chronic Kidney Disease.   . Anion gap 04/06/2016 8  5 - 15 Final    STUDIES: No results found.  ASSESSMENT: Squamous cell carcinoma for lung T1, N0, M0 tumor. (July, 2015). Status post stereotactic radiation therapy (September 2015)   PLAN:   1. Lung Cancer- Squamous cell carcinoma of lung T1 N1 M0 tumor status post radiation therapy with stereotactic surgery. Lymph node has  decreased in size, left upper lobe nodule is decreased. Left lower lobe lung nodule needs to be observed. She had a CXR today but it is not resulted. CT scan from may 2017 shows no acute chest abnormality. Return to clinic in 6 months with CXR.  2. CHF- Treatment per cardiology  Patient expressed understanding and was in agreement with this plan. She also understands that She can call clinic at any time with any questions, concerns, or complaints.   Dr. Grayland Ormond was available for consultation and review of plan of care for this patient.   Mayra Reel, NP   04/06/2016 3:46 PM

## 2016-05-03 ENCOUNTER — Ambulatory Visit (INDEPENDENT_AMBULATORY_CARE_PROVIDER_SITE_OTHER): Payer: Medicare Other | Admitting: Internal Medicine

## 2016-05-03 ENCOUNTER — Encounter: Payer: Self-pay | Admitting: Internal Medicine

## 2016-05-03 VITALS — BP 115/74 | HR 76 | Ht 62.0 in | Wt 191.0 lb

## 2016-05-03 DIAGNOSIS — J9611 Chronic respiratory failure with hypoxia: Secondary | ICD-10-CM | POA: Diagnosis not present

## 2016-05-03 MED ORDER — ALBUTEROL SULFATE HFA 108 (90 BASE) MCG/ACT IN AERS: 2.0000 | INHALATION_SPRAY | RESPIRATORY_TRACT | 2 refills | Status: DC | PRN

## 2016-05-03 NOTE — Patient Instructions (Addendum)
Continue oxygen as needed Check PFT and 6MWT Follow in 3 months Albuterol hand held inhaler as needed      Chronic Obstructive Pulmonary Disease Chronic obstructive pulmonary disease (COPD) is a common lung condition in which airflow from the lungs is limited. COPD is a general term that can be used to describe many different lung problems that limit airflow, including both chronic bronchitis and emphysema. If you have COPD, your lung function will probably never return to normal, but there are measures you can take to improve lung function and make yourself feel better. CAUSES   Smoking (common).  Exposure to secondhand smoke.  Genetic problems.  Chronic inflammatory lung diseases or recurrent infections. SYMPTOMS  Shortness of breath, especially with physical activity.  Deep, persistent (chronic) cough with a large amount of thick mucus.  Wheezing.  Rapid breaths (tachypnea).  Gray or bluish discoloration (cyanosis) of the skin, especially in your fingers, toes, or lips.  Fatigue.  Weight loss.  Frequent infections or episodes when breathing symptoms become much worse (exacerbations).  Chest tightness. DIAGNOSIS Your health care provider will take a medical history and perform a physical examination to diagnose COPD. Additional tests for COPD may include:  Lung (pulmonary) function tests.  Chest X-ray.  CT scan.  Blood tests. TREATMENT  Treatment for COPD may include:  Inhaler and nebulizer medicines. These help manage the symptoms of COPD and make your breathing more comfortable.  Supplemental oxygen. Supplemental oxygen is only helpful if you have a low oxygen level in your blood.  Exercise and physical activity. These are beneficial for nearly all people with COPD.  Lung surgery or transplant.  Nutrition therapy to gain weight, if you are underweight.  Pulmonary rehabilitation. This may involve working with a team of health care providers and  specialists, such as respiratory, occupational, and physical therapists. HOME CARE INSTRUCTIONS  Take all medicines (inhaled or pills) as directed by your health care provider.  Avoid over-the-counter medicines or cough syrups that dry up your airway (such as antihistamines) and slow down the elimination of secretions unless instructed otherwise by your health care provider.  If you are a smoker, the most important thing that you can do is stop smoking. Continuing to smoke will cause further lung damage and breathing trouble. Ask your health care provider for help with quitting smoking. He or she can direct you to community resources or hospitals that provide support.  Avoid exposure to irritants such as smoke, chemicals, and fumes that aggravate your breathing.  Use oxygen therapy and pulmonary rehabilitation if directed by your health care provider. If you require home oxygen therapy, ask your health care provider whether you should purchase a pulse oximeter to measure your oxygen level at home.  Avoid contact with individuals who have a contagious illness.  Avoid extreme temperature and humidity changes.  Eat healthy foods. Eating smaller, more frequent meals and resting before meals may help you maintain your strength.  Stay active, but balance activity with periods of rest. Exercise and physical activity will help you maintain your ability to do things you want to do.  Preventing infection and hospitalization is very important when you have COPD. Make sure to receive all the vaccines your health care provider recommends, especially the pneumococcal and influenza vaccines. Ask your health care provider whether you need a pneumonia vaccine.  Learn and use relaxation techniques to manage stress.  Learn and use controlled breathing techniques as directed by your health care provider. Controlled breathing techniques include:  Pursed lip breathing. Start by breathing in (inhaling) through  your nose for 1 second. Then, purse your lips as if you were going to whistle and breathe out (exhale) through the pursed lips for 2 seconds.  Diaphragmatic breathing. Start by putting one hand on your abdomen just above your waist. Inhale slowly through your nose. The hand on your abdomen should move out. Then purse your lips and exhale slowly. You should be able to feel the hand on your abdomen moving in as you exhale.  Learn and use controlled coughing to clear mucus from your lungs. Controlled coughing is a series of short, progressive coughs. The steps of controlled coughing are: 1. Lean your head slightly forward. 2. Breathe in deeply using diaphragmatic breathing. 3. Try to hold your breath for 3 seconds. 4. Keep your mouth slightly open while coughing twice. 5. Spit any mucus out into a tissue. 6. Rest and repeat the steps once or twice as needed. SEEK MEDICAL CARE IF:  You are coughing up more mucus than usual.  There is a change in the color or thickness of your mucus.  Your breathing is more labored than usual.  Your breathing is faster than usual. SEEK IMMEDIATE MEDICAL CARE IF:  You have shortness of breath while you are resting.  You have shortness of breath that prevents you from:  Being able to talk.  Performing your usual physical activities.  You have chest pain lasting longer than 5 minutes.  Your skin color is more cyanotic than usual.  You measure low oxygen saturations for longer than 5 minutes with a pulse oximeter. MAKE SURE YOU:  Understand these instructions.  Will watch your condition.  Will get help right away if you are not doing well or get worse.   This information is not intended to replace advice given to you by your health care provider. Make sure you discuss any questions you have with your health care provider.   Document Released: 06/14/2005 Document Revised: 09/25/2014 Document Reviewed: 05/01/2013 Elsevier Interactive Patient  Education [employer].

## 2016-05-03 NOTE — Progress Notes (Signed)
[facility] Consultation      Date: 05/03/2016,   MRN# [id number] SHELLI PORTILLA Jun 05, 1935 Code Status:  Hosp day:'@LENGTHOFSTAYDAYS'$ @ Referring MD: '@ATDPROV'$ @     PCP:   CHIEF COMPLAINT:   follow up for COPD   HISTORY OF PRESENT ILLNESS   81 yo pleasant white female with h/o COPD and Stage 1 SQ cell cancer dx in 2015 s/p Ct guided biopsy s/p RXT  OV on 5/31 needed prednisone taper-dx copd exacerbation Patient states doing OK, still with DOE Patient has no signs of infection at this time Intermittent wheezing, no cough, no SOB, uses alb as needed  PFT 09/2013 Ratio 52% Fev1 49% DLCO 47%+BD response Airtrapping and Hyperinflation  Current Medication:  Current Outpatient Prescriptions:  .  albuterol (PROVENTIL HFA;VENTOLIN HFA) 108 (90 Base) MCG/ACT inhaler, Inhale 2 puffs into the lungs every 4 (four) hours as needed for wheezing or shortness of breath., Disp: 1 Inhaler, Rfl: 12 .  budesonide (PULMICORT) 0.5 MG/2ML nebulizer solution, Take 2 mLs (0.5 mg total) by nebulization 2 (two) times daily., Disp: 120 mL, Rfl: 11 .  diltiazem (CARDIZEM CD) 300 MG 24 hr capsule, Take 300 mg by mouth daily., Disp: , Rfl:  .  flecainide (TAMBOCOR) 100 MG tablet, Take 1 tablet (100 mg total) by mouth 2 (two) times daily., Disp: 60 tablet, Rfl: 11 .  formoterol (PERFOROMIST) 20 MCG/2ML nebulizer solution, Take 2 mLs (20 mcg total) by nebulization 2 (two) times daily., Disp: 2 mL, Rfl: 12 .  furosemide (LASIX) 40 MG tablet, Take 1 tablet (40 mg total) by mouth 2 (two) times daily., Disp: 60 tablet, Rfl: 0 .  ipratropium-albuterol (DUONEB) 0.5-2.5 (3) MG/3ML SOLN, Take 3 mLs by nebulization every 4 (four) hours as needed (for wheezing/shortness of breath)., Disp: , Rfl:  .  levothyroxine (SYNTHROID, LEVOTHROID) 75 MCG tablet, Take 75 mcg by mouth daily before breakfast., Disp: , Rfl:  .  omega-3 acid ethyl esters (LOVAZA) 1 g capsule, Take 1 g by mouth daily., Disp: , Rfl:  .   potassium chloride (K-DUR,KLOR-CON) 10 MEQ tablet, Take 20 mEq by mouth daily., Disp: , Rfl:  .  senna-docusate (SENOKOT-S) 8.6-50 MG tablet, Take 2 tablets by mouth 2 (two) times daily. Hold if >2 bowel movements/day, Disp: 60 tablet, Rfl: 2    ALLERGIES   Belladonna alkaloids     REVIEW OF SYSTEMS   Review of Systems  Constitutional: Negative for chills, fever and weight loss.  HENT: Negative for congestion.   Eyes: Negative for blurred vision.  Respiratory: Positive for shortness of breath. Negative for cough, hemoptysis, sputum production and wheezing.   Cardiovascular: Positive for orthopnea and leg swelling. Negative for chest pain and palpitations.  All other systems reviewed and are negative.    VS: BP 115/74 (BP Location: Left Arm, Cuff Size: Normal)   Pulse 76   Ht '5\' 2"'$  (1.575 m)   Wt 191 lb (86.6 kg)   SpO2 95%   BMI 34.93 kg/m    PHYSICAL EXAM  Physical Exam  Constitutional: She is oriented to person, place, and time. She appears well-developed.  HENT:  Head: Normocephalic and atraumatic.  Eyes: Pupils are equal, round, and reactive to light.  Cardiovascular: Normal rate, regular rhythm and normal heart sounds.   No murmur heard. Pulmonary/Chest: Effort normal and breath sounds normal. No stridor. No respiratory distress. She has no wheezes.  Musculoskeletal: She exhibits no edema.  Neurological: She is alert and oriented to person, place, and time.  Skin: Skin is warm.  Psychiatric: She has a normal mood and affect.         ASSESSMENT/PLAN    81 yo white female with moderate COPD GOLD stage B with Chronic Hypoxic resp failure H/o Squamous cell carcinoma of lung T1 N1 M0 tumor status post radiation therapy with stereotactic surgery  COPD (chronic obstructive pulmonary disease)-seems stable at this time, will check 6MWT -will continue inhaled NEB medications(Performist and Budesonide) as prescribed-this seems to help her breathing -continue  oxygen at night -repeat 6MWT and PFT's-assess for oxygen with exertion  RUL patchy GGO/h/o RUL lung cancer -follow up oncology  Follow up in 3 months  The Patient requires high complexity decision making for assessment and support, frequent evaluation and titration of therapies, application of advanced monitoring technologies and extensive interpretation of multiple databases.   Patient/Family is satisfied with Plan of action and management.    Corrin Parker, M.D.  [facility]  Medical Director [facility] Cardio-Pulmonary Department

## 2016-06-02 ENCOUNTER — Encounter: Payer: Self-pay | Admitting: Cardiovascular Disease

## 2016-06-02 ENCOUNTER — Ambulatory Visit (INDEPENDENT_AMBULATORY_CARE_PROVIDER_SITE_OTHER): Payer: Medicare Other | Admitting: Cardiovascular Disease

## 2016-06-02 VITALS — BP 114/70 | HR 61 | Ht 62.0 in | Wt 185.4 lb

## 2016-06-02 DIAGNOSIS — E785 Hyperlipidemia, unspecified: Secondary | ICD-10-CM | POA: Diagnosis not present

## 2016-06-02 DIAGNOSIS — I483 Typical atrial flutter: Secondary | ICD-10-CM

## 2016-06-02 DIAGNOSIS — I480 Paroxysmal atrial fibrillation: Secondary | ICD-10-CM

## 2016-06-02 DIAGNOSIS — I10 Essential (primary) hypertension: Secondary | ICD-10-CM

## 2016-06-02 DIAGNOSIS — W19XXXS Unspecified fall, sequela: Secondary | ICD-10-CM

## 2016-06-02 DIAGNOSIS — I5032 Chronic diastolic (congestive) heart failure: Secondary | ICD-10-CM

## 2016-06-02 DIAGNOSIS — N183 Chronic kidney disease, stage 3 unspecified: Secondary | ICD-10-CM

## 2016-06-02 DIAGNOSIS — J432 Centrilobular emphysema: Secondary | ICD-10-CM

## 2016-06-02 MED ORDER — RIVAROXABAN 15 MG PO TABS: 15.0000 mg | ORAL_TABLET | Freq: Every day | ORAL | 11 refills | Status: DC

## 2016-06-02 NOTE — Patient Instructions (Signed)
Medication Instructions:   Please start Xarelto one a day  Labwork:  No new labs needed  Testing/Procedures:  No further testing at this time   Follow-Up: It was a pleasure seeing you in the office today. Please call us if you have new issues that need to be addressed before your next appt.  [phone]  Your physician wants you to follow-up in: 6 months.  You will receive a reminder letter in the mail two months in advance. If you don't receive a letter, please call our office to schedule the follow-up appointment.  If you need a refill on your cardiac medications before your next appointment, please call your pharmacy.

## 2016-06-02 NOTE — Progress Notes (Signed)
Cardiology Office Note  Date:  06/02/2016   ID:  Toni [name], DOB 02/27/1935, MRN [id number]  PCP:  Pcp Not In System   Chief Complaint  Patient presents with  . Follow-up    no cp, sob or swelling. no other complaints.    HPI:  Toni [name] is a 80 yo woman with long smoking hx, COPD, hyperlipidemia, previous hospitalization on 11/15/2013 for shortness of breath. history of paroxysmal atrial fibrillation,  Hospitalization at [facility] 09/26/2013 for COPD exacerbation, atrial fibrillation. Noted to be in atrial flutter 09/12/2013 EKGs at the end of February 2015, beginning of March 2015 showing atrial flutter She presents today for routine follow-up of her atrial fibrillation/flutter She has completed radiation and chemotherapy for squamous cell lung cancer  Moderate aortic valve stenosis: 02/2016 She has chronic renal insufficiency  Previous history of skin tears on the  Xarelto, it was recommended that she try  eliquis 5 mg twice a day  she did not start the new medication, it was too expensive  On her last clinic visit, she had a fall at [facility], hit her right side of the face, right knee, right arm . She denies any recent falls on today's visit, no significant ecchymoses  Denies any tachycardia or shortness of breath symptoms concerning for arrhythmia  No regular exercise   Creatinine clearance estimated based on chronic renal insufficiency, less than 50   EKG shows normal sinus rhythm with first-degree AV block, prolonged QT   Other past medical history reviewed Bronchitis February 2015  11/15/13 - admitted with Afib RVR started on diltiazem drip  treated with prednisone, Levaquin, nebulizers for COPD exacerbation 3/3 - placed on bipap 3/7 - started on flecainide for A flutter, started on anticoagulation   CT scan in December 2014 identified a lung nodule. Additional workup including biopsy has confirmed squamous cell lung cancer.  radiation starting  August 2015.    stopped smoking 10-12 years ago. Previous Total cholesterol 220, LDL 155. She does not want a cholesterol medication echocardiogram in the hospital last month showed ejection fraction 55-60%, moderately dilated left atrium and right atrium, mild aortic valve stenosis   PMH:   has a past medical history of Atypical atrial flutter ([place]) (11/16/2013); Cataract; Chronic diastolic CHF (congestive heart failure) ([place]); Chronic diastolic heart failure ([place]); Collagen vascular disease ([place]); COPD (chronic obstructive pulmonary disease) ([place]); Essential hypertension, benign; Hypokalemia; Hypothyroidism; Kidney disease, chronic, stage II (mild, EGFR 60+ ml/min); Lung cancer ([place]); Lung nodule; and PAF (paroxysmal atrial fibrillation) ([place]).  PSH:    Past Surgical History:  Procedure Laterality Date  . EYE SURGERY    . TOTAL KNEE ARTHROPLASTY Bilateral     Current Outpatient Prescriptions  Medication Sig Dispense Refill  . albuterol (PROVENTIL HFA;VENTOLIN HFA) 108 (90 Base) MCG/ACT inhaler Inhale 2 puffs into the lungs every 4 (four) hours as needed for wheezing or shortness of breath. 1 Inhaler 12  . albuterol (PROVENTIL HFA;VENTOLIN HFA) 108 (90 Base) MCG/ACT inhaler Inhale 2 puffs into the lungs every 4 (four) hours as needed for wheezing or shortness of breath. 1 Inhaler 2  . budesonide (PULMICORT) 0.5 MG/2ML nebulizer solution Take 2 mLs (0.5 mg total) by nebulization 2 (two) times daily. 120 mL 11  . diltiazem (CARDIZEM CD) 300 MG 24 hr capsule Take 300 mg by mouth daily.    . flecainide (TAMBOCOR) 100 MG tablet Take 1 tablet (100 mg total) by mouth 2 (two) times daily. 60 tablet 11  . formoterol (PERFOROMIST) 20  MCG/2ML nebulizer solution Take 2 mLs (20 mcg total) by nebulization 2 (two) times daily. 2 mL 12  . furosemide (LASIX) 40 MG tablet Take 1 tablet (40 mg total) by mouth 2 (two) times daily. 60 tablet 0  . ipratropium-albuterol (DUONEB) 0.5-2.5 (3) MG/3ML SOLN Take 3 mLs by  nebulization every 4 (four) hours as needed (for wheezing/shortness of breath).    Marland Kitchen levothyroxine (SYNTHROID, LEVOTHROID) 75 MCG tablet Take 75 mcg by mouth daily before breakfast.    . omega-3 acid ethyl esters (LOVAZA) 1 g capsule Take 1 g by mouth daily.    . potassium chloride (K-DUR,KLOR-CON) 10 MEQ tablet Take 20 mEq by mouth daily.    . Rivaroxaban (XARELTO) 15 MG TABS tablet Take 1 tablet (15 mg total) by mouth daily with supper. 30 tablet 11   No current facility-administered medications for this visit.      Allergies:   Belladonna alkaloids and Dabigatran   Social History:  The patient  reports that she quit smoking about 13 years ago. Her smoking use included Cigarettes. She has a 75.00 pack-year smoking history. She has never used smokeless tobacco. She reports that she does not drink alcohol or use drugs.   Family History:   family history includes Arrhythmia in her sister; CAD in her father; Heart attack in her father; Heart disease in her father.    Review of Systems: Review of Systems  Constitutional: Negative.   Respiratory: Positive for cough and shortness of breath.   Cardiovascular: Negative.   Gastrointestinal: Negative.   Musculoskeletal: Negative.        Gait instability  Neurological: Negative.   Psychiatric/Behavioral: Negative.   All other systems reviewed and are negative.    PHYSICAL EXAM: VS:  BP 114/70 (BP Location: Right Arm, Patient Position: Sitting, Cuff Size: Normal)   Pulse 61   Ht 5' 2"  (1.575 m)   Wt 185 lb 6.4 oz (84.1 kg)   BMI 33.91 kg/m  , BMI Body mass index is 33.91 kg/m. GEN: Well nourished, well developed, in no acute distress  HEENT: normal  Neck: no JVD, carotid bruits, or masses Cardiac: RRR; no murmurs, rubs, or gallops,no edema  Respiratory:  Decreased breath sounds throughout bilaterally, normal work of breathing GI: soft, nontender, nondistended, + BS Toni: no deformity or atrophy  Skin: warm and dry, no rash Neuro:   Strength and sensation are intact Psych: euthymic mood, full affect    Recent Labs: 02/18/2016: Magnesium 1.9 04/06/2016: ALT 11; BUN 25; Creatinine, Ser 1.27; Hemoglobin 11.4; Platelets 298; Potassium 3.8; Sodium 139    Lipid Panel Lab Results  Component Value Date   CHOL 220 (H) 11/16/2013   HDL 53 11/16/2013   LDLCALC 155 (H) 11/16/2013   TRIG 58 11/16/2013      Wt Readings from Last 3 Encounters:  06/02/16 185 lb 6.4 oz (84.1 kg)  05/03/16 191 lb (86.6 kg)  02/26/16 187 lb 6.4 oz (85 kg)       ASSESSMENT AND PLAN:  Paroxysmal atrial fibrillation (HCC) - Plan: EKG 12-Lead Long discussion concerning anticoagulation No recent falls History of atrial flutter and atrial fibrillation Chads VAsc of at least 5 She is willing to retry Xarelto. Dose of 15 mg daily given creatinine clearance less than 50 She does not want warfarin if possible  Typical atrial flutter (HCC) - Plan: EKG 12-Lead  as above, will start anticoagulation Hyperlipidemia  Chronic diastolic CHF (congestive heart failure) ([place]) Appears to be euvolemic on today's visit.  Takes Lasix 40 mg up to twice a day Weight stable  Essential (primary) hypertension Blood pressure is well controlled on today's visit. No changes made to the medications.  Centrilobular emphysema (HCC) Severe underlying lung disease Long   smoking history. Currently on inhalers, nebulizers  Chronic kidney disease, stage 3 (moderate)  elevated creatinine, low creatinine clearance  Fall, sequela:  Long discussion concerning risk of anticoagulation, falls She denies any recent falls We'll start anticoagulation with close monitoring   Total encounter time more than 25 minutes  Greater than 50% was spent in counseling and coordination of care with the patient   Disposition:   F/U  6 months   Orders Placed This Encounter  Procedures  . EKG 12-Lead     Signed, Esmond Plants, M.D., Ph.D. 06/02/2016  [place], [place]

## 2016-07-23 ENCOUNTER — Emergency Department: Payer: Medicare Other

## 2016-07-23 ENCOUNTER — Emergency Department
Admission: EM | Admit: 2016-07-23 | Discharge: 2016-07-23 | Disposition: A | Discharge status: Home / Self Care | Payer: Medicare Other | Attending: Emergency Medicine | Admitting: Emergency Medicine

## 2016-07-23 DIAGNOSIS — R0602 Shortness of breath: Secondary | ICD-10-CM | POA: Diagnosis present

## 2016-07-23 DIAGNOSIS — N182 Chronic kidney disease, stage 2 (mild): Secondary | ICD-10-CM | POA: Diagnosis not present

## 2016-07-23 DIAGNOSIS — I5032 Chronic diastolic (congestive) heart failure: Secondary | ICD-10-CM | POA: Diagnosis not present

## 2016-07-23 DIAGNOSIS — J441 Chronic obstructive pulmonary disease with (acute) exacerbation: Secondary | ICD-10-CM

## 2016-07-23 DIAGNOSIS — E039 Hypothyroidism, unspecified: Secondary | ICD-10-CM | POA: Diagnosis not present

## 2016-07-23 DIAGNOSIS — I130 Hypertensive heart and chronic kidney disease with heart failure and stage 1 through stage 4 chronic kidney disease, or unspecified chronic kidney disease: Secondary | ICD-10-CM | POA: Diagnosis not present

## 2016-07-23 DIAGNOSIS — Z87891 Personal history of nicotine dependence: Secondary | ICD-10-CM | POA: Diagnosis not present

## 2016-07-23 DIAGNOSIS — K5900 Constipation, unspecified: Secondary | ICD-10-CM | POA: Diagnosis not present

## 2016-07-23 DIAGNOSIS — Z85118 Personal history of other malignant neoplasm of bronchus and lung: Secondary | ICD-10-CM | POA: Insufficient documentation

## 2016-07-23 DIAGNOSIS — Z79899 Other long term (current) drug therapy: Secondary | ICD-10-CM | POA: Diagnosis not present

## 2016-07-23 MED ORDER — PREDNISONE 20 MG PO TABS
40.0000 mg | ORAL_TABLET | ORAL | Status: AC
  Administered 2016-07-23: 40 mg via ORAL
  Filled 2016-07-23: qty 2

## 2016-07-23 MED ORDER — ALBUTEROL SULFATE (2.5 MG/3ML) 0.083% IN NEBU
5.0000 mg | INHALATION_SOLUTION | Freq: Once | RESPIRATORY_TRACT | Status: AC
  Administered 2016-07-23: 5 mg via RESPIRATORY_TRACT
  Filled 2016-07-23: qty 6

## 2016-07-23 MED ORDER — ALBUTEROL SULFATE HFA 108 (90 BASE) MCG/ACT IN AERS: 2.0000 | INHALATION_SPRAY | RESPIRATORY_TRACT | 0 refills | Status: DC | PRN

## 2016-07-23 MED ORDER — AZITHROMYCIN 250 MG PO TABS: ORAL_TABLET | ORAL | 0 refills | Status: DC

## 2016-07-23 MED ORDER — IPRATROPIUM-ALBUTEROL 0.5-2.5 (3) MG/3ML IN SOLN
3.0000 mL | Freq: Once | RESPIRATORY_TRACT | Status: AC
  Administered 2016-07-23: 3 mL via RESPIRATORY_TRACT
  Filled 2016-07-23: qty 3

## 2016-07-23 MED ORDER — PREDNISONE 20 MG PO TABS: 20.0000 mg | ORAL_TABLET | Freq: Every day | ORAL | 0 refills | Status: DC

## 2016-07-23 NOTE — ED Triage Notes (Signed)
Patient reports feel weak, short of breath and "full of gas, I keep burping".  Patient reports was seen at [facility] last night for similar symptoms.

## 2016-07-23 NOTE — ED Notes (Signed)
Pt reports no chest pain only "tightness" that is followed by a belch.

## 2016-07-23 NOTE — ED Provider Notes (Signed)
[facility] Emergency Department Provider Note  ____________________________________________  Time seen: Approximately 9:31 PM  I have reviewed the triage vital signs and the nursing notes.   HISTORY  Chief Complaint Weakness and Shortness of Breath    HPI Toni [name] is a 81 y.o. female who complains of shortness of breath for the past 3 days. No cough or chest pain. No abdominal pain but is having a lot of gas had not had a bowel movement in 5-6 days. She is taking iron 2 times a day. She was seen in the [facility] ED yesterday, had a unremarkable chest and abdominal x-rays, was started on MiraLAX.  Patient has been compliant with her Lasix, takes 80 mg once a day.     Past Medical History:  Diagnosis Date  . Atypical atrial flutter ([place]) 11/16/2013   a. CHA2DS2VASc = 5-->prev on xarelto, d/c'd due to bleeding skin tear;  c. 11/2013->Placed on flecainide.  . Cataract   . Chronic diastolic CHF (congestive heart failure) ([place])    a. 11/2013 Echo: EF 55-60%, mild AS/MR, mod dil LA/RA, PASP 58mHg.  .Marland KitchenChronic diastolic heart failure ([place]LaF[place]   . Collagen vascular disease ([place]   . COPD (chronic obstructive pulmonary disease) ([place]   . Essential hypertension, benign   . Hypokalemia   . Hypothyroidism   . 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 Lung cancer ([place]   . Lung nodule    Followed by Dr. FRaul Del . PAF (paroxysmal atrial fibrillation) ([place]    a. 11/2013 Echo: EF 55-60%, mild AS/MR, mod dil LA/RA, PASP 342mg;  b. CHA2DS2VASc = 5-->prev on xarelto, d/c'd due to bleeding skin tear;  c. 11/2013->Placed on flecainide.     Patient Active Problem List   Diagnosis Date Noted  . Adjustment disorder with anxiety 02/23/2016  . Dyspepsia   . Esophageal reflux   . Encounter for anticoagulation discussion and counseling 11/17/2015  . Fall 11/17/2015  . Essential hypertension, benign   . Lung infiltrate on CT 06/08/2015  .  Chronic diastolic CHF (congestive heart failure) (HCSingac08/29/2016  . Insomnia 04/17/2014  . Squamous cell lung cancer (HCWasola07/06/2014  . Postnasal drip 03/05/2014  . Hyperlipidemia 12/04/2013  . Leg edema 12/04/2013  . Atypical atrial flutter (HCPalm [place] North03/09/2013  . COPD (chronic obstructive pulmonary disease) (HCGrannis02/28/2015  . Chronic kidney disease 08/24/2013  . Essential (primary) hypertension 08/24/2013  . Adult hypothyroidism 08/24/2013  . Acute exacerbation of chronic obstructive airways disease (HCGreenbriar12/03/2013  . Chronic obstructive pulmonary disease with acute exacerbation (HCWest Marion12/03/2013     Past Surgical History:  Procedure Laterality Date  . EYE SURGERY    . TOTAL KNEE ARTHROPLASTY Bilateral      Prior to Admission medications   Medication Sig Start Date End Date Taking? Authorizing Provider  albuterol (PROVENTIL HFA;VENTOLIN HFA) 108 (90 Base) MCG/ACT inhaler Inhale 2 puffs into the lungs every 4 (four) hours as needed for wheezing or shortness of breath. 05/03/16  Yes KuFlora LippsMD  budesonide (PULMICORT) 0.5 MG/2ML nebulizer solution Take 2 mLs (0.5 mg total) by nebulization 2 (two) times daily. 06/08/15  Yes KuFlora LippsMD  diltiazem (CARDIZEM CD) 300 MG 24 hr capsule Take 300 mg by mouth daily.   Yes Historical Provider, MD  flecainide (TAMBOCOR) 100 MG tablet Take 1 tablet (100 mg total) by mouth 2 (two) times daily. 11/24/13  Yes ElFrazier RichardsMD  formoterol (PERFOROMIST) 20 MCG/2ML nebulizer  solution Take 2 mLs (20 mcg total) by nebulization 2 (two) times daily. 06/08/15  Yes Flora Lipps, MD  furosemide (LASIX) 40 MG tablet Take 1 tablet (40 mg total) by mouth 2 (two) times daily. 02/26/16  Yes Sital Mody, MD  ipratropium-albuterol (DUONEB) 0.5-2.5 (3) MG/3ML SOLN Take 3 mLs by nebulization every 4 (four) hours as needed (for wheezing/shortness of breath).   Yes Historical Provider, MD  levothyroxine (SYNTHROID, LEVOTHROID) 75 MCG tablet Take 75 mcg by mouth daily  before breakfast.   Yes Historical Provider, MD  omega-3 acid ethyl esters (LOVAZA) 1 g capsule Take 1 g by mouth daily.   Yes Historical Provider, MD  potassium chloride (K-DUR,KLOR-CON) 10 MEQ tablet Take 20 mEq by mouth daily.   Yes Historical Provider, MD  albuterol (PROVENTIL HFA) 108 (90 Base) MCG/ACT inhaler Inhale 2 puffs into the lungs every 4 (four) hours as needed for wheezing or shortness of breath. 07/23/16   Carrie Mew, MD  albuterol (PROVENTIL HFA;VENTOLIN HFA) 108 (90 Base) MCG/ACT inhaler Inhale 2 puffs into the lungs every 4 (four) hours as needed for wheezing or shortness of breath. Patient not taking: Reported on 07/23/2016 02/16/16   Wilhelmina Mcardle, MD  azithromycin (ZITHROMAX Z-PAK) 250 MG tablet Take 2 tablets (500 mg) on  Day 1,  followed by 1 tablet (250 mg) once daily on Days 2 through 5. 07/23/16   Carrie Mew, MD  predniSONE (DELTASONE) 20 MG tablet Take 1 tablet (20 mg total) by mouth daily. 07/23/16   Carrie Mew, MD  Rivaroxaban (XARELTO) 15 MG TABS tablet Take 1 tablet (15 mg total) by mouth daily with supper. Patient not taking: Reported on 07/23/2016 06/02/16   Minna Merritts, MD     Allergies Belladonna alkaloids and Dabigatran   Family History  Problem Relation Age of Onset  . CAD Father   . Heart disease Father   . Heart attack Father   . Arrhythmia Sister     Social History Social History  Substance Use Topics  . Smoking status: Former Smoker    Packs/day: 1.50    Years: 50.00    Types: Cigarettes    Quit date: 09/18/2002  . Smokeless tobacco: Never Used  . Alcohol use No    Review of Systems  Constitutional:   No fever or chills.   Cardiovascular:   No chest pain. Respiratory:   Positive shortness of breath. Gastrointestinal:   Negative for abdominal pain, vomiting and diarrhea. Positive constipation Musculoskeletal:   Negative for focal pain or swelling 10-point ROS otherwise  negative.  ____________________________________________   PHYSICAL EXAM:  VITAL SIGNS: ED Triage Vitals  Enc Vitals Group     BP 07/23/16 1841 (!) 145/53     Pulse Rate 07/23/16 1930 66     Resp 07/23/16 1841 (!) 22     Temp 07/23/16 1841 98 F (36.7 C)     Temp Source 07/23/16 1841 Oral     SpO2 07/23/16 1841 99 %     Weight 07/23/16 1841 175 lb (79.4 kg)     Height 07/23/16 1841 5' 6"  (1.676 m)     Head Circumference --      Peak Flow --      Pain Score --      Pain Loc --      Pain Edu? --      Excl. in [place]? --     Vital signs reviewed, nursing assessments reviewed.   Constitutional:   Alert and oriented.  Well appearing and in no distress. Eyes:   No scleral icterus. No conjunctival pallor. PERRL. EOMI.  No nystagmus. ENT   Head:   Normocephalic and atraumatic.   Nose:   No congestion/rhinnorhea. No septal hematoma   Mouth/Throat:   MMM, no pharyngeal erythema. No peritonsillar mass.    Neck:   No stridor. No SubQ emphysema. No meningismus. Hematological/Lymphatic/Immunilogical:   No cervical lymphadenopathy. Cardiovascular:   RRR. Symmetric bilateral radial and DP pulses.  No murmurs.  Respiratory:   Mild tachypnea, respiratory rate 22. Diffuse expiratory wheezing with prolonged expiratory phase. Good air entry in all lung fields. Gastrointestinal:   Soft with mild bilateral lower quadrant tenderness. Non distended. There is no CVA tenderness.  No rebound, rigidity, or guarding. Genitourinary:   deferred Musculoskeletal:   Nontender with normal range of motion in all extremities. No joint effusions.  No lower extremity tenderness.  No edema. Neurologic:   Normal speech and language.  CN 2-10 normal. Motor grossly intact. No gross focal neurologic deficits are appreciated.  Skin:    Skin is warm, dry and intact. No rash noted.  No petechiae, purpura, or bullae.  ____________________________________________    LABS (pertinent  positives/negatives) (all labs ordered are listed, but only abnormal results are displayed) Labs Reviewed - No data to display ____________________________________________   EKG  Interpreted by me Atrial fibrillation, rate controlled at rate of 64. Normal axis intervals QRS ST segments and T waves.  ____________________________________________    RADIOLOGY  Chest x-ray unremarkable CT abdomen and pelvis unremarkable, large stool burden without impaction.  ____________________________________________   PROCEDURES Procedures  ____________________________________________   INITIAL IMPRESSION / ASSESSMENT AND PLAN / ED COURSE  Pertinent labs & imaging results that were available during my care of the patient were reviewed by me and considered in my medical decision making (see chart for details).  Patient presents with wheezing and shortness of breath. On her usual oxygen, saturation is 100%. Consist with COPD exacerbation. Prednisone and DuoNeb's here.    ----------------------------------------- 9:33 PM on 07/23/2016 -----------------------------------------  Workup negative. Vitals remained unremarkable. Patient feels much better. Repeat lung auscultation shows is now clear to auscultation bilaterally. Consistent with COPD exacerbation.Considering the patient's symptoms, medical history, and physical examination today, I have low suspicion for ACS, PE, TAD, pneumothorax, carditis, mediastinitis, pneumonia, CHF, or sepsis.  Continue prednisone and bronchodilators. Azithromycin. Stop iron until she's had a few large bowel movements. Continue MiraLAX and Senokot which she is taking at home. Follow-up with primary care. No evidence of bowel obstruction or perforation. Case discussed with patient and family at bedside and I'll agree with the plan.  Patient's family report that her portable oxygen tank is empty. She has a home concentrator, but nothing to travel with. I offered  EMS transport for continued oxygen therapy during the 30 minute drive home, patient refuses. After stopping her septal oxygen in the treatment room for at least 5 minutes, lowest oxygen saturation is 93%, but usually pretty steady at about 95%. Since she will be at rest and seated for the duration of the drive, she should be able to tolerate the drive home without oxygen for now, to resume when she does arrive back at her house. This seems to be our only option for transport home anyway since the patient refuses EMS.     Clinical Course    ____________________________________________   FINAL CLINICAL IMPRESSION(S) / ED DIAGNOSES  Final diagnoses:  COPD exacerbation (HCC)  Constipation, unspecified constipation type  Shortness of  breath       Portions of this note were generated with dragon dictation software. Dictation errors may occur despite best attempts at proofreading.    Carrie Mew, MD 07/23/16 2135

## 2016-08-01 ENCOUNTER — Telehealth: Payer: Self-pay | Admitting: Internal Medicine

## 2016-08-01 ENCOUNTER — Ambulatory Visit (INDEPENDENT_AMBULATORY_CARE_PROVIDER_SITE_OTHER): Payer: Medicare Other | Admitting: Internal Medicine

## 2016-08-01 ENCOUNTER — Encounter: Payer: Self-pay | Admitting: Internal Medicine

## 2016-08-01 ENCOUNTER — Telehealth: Payer: Self-pay | Admitting: Cardiovascular Disease

## 2016-08-01 VITALS — BP 138/84 | HR 62 | Wt 194.0 lb

## 2016-08-01 DIAGNOSIS — G300 Alzheimer's disease with early onset: Secondary | ICD-10-CM | POA: Diagnosis not present

## 2016-08-01 DIAGNOSIS — J441 Chronic obstructive pulmonary disease with (acute) exacerbation: Secondary | ICD-10-CM | POA: Diagnosis not present

## 2016-08-01 DIAGNOSIS — F0280 Dementia in other diseases classified elsewhere without behavioral disturbance: Secondary | ICD-10-CM

## 2016-08-01 MED ORDER — PREDNISONE 20 MG PO TABS: 20.0000 mg | ORAL_TABLET | Freq: Every day | ORAL | 1 refills | Status: DC

## 2016-08-01 NOTE — Telephone Encounter (Signed)
Pt son was here with pt who was seeing Dr. Mortimer Fries in Pulmonary. He scheduled pt appt for 11/20. States pt is having side effects form Flecainide. States she is having SOB, Wheezing, cough, tightness in chest. He wanted to let Dr. Rockey Situ know.

## 2016-08-01 NOTE — Patient Instructions (Addendum)
dounebs every 4 hrs until wheezing stops Prednisone 40 mg daily for 7 days Neurology referral Follow up in 2 weeks   Chronic Obstructive Pulmonary Disease Chronic obstructive pulmonary disease (COPD) is a common lung problem. In COPD, the flow of air from the lungs is limited. The way your lungs work will probably never return to normal, but there are things you can do to improve your lungs and make yourself feel better. Your doctor may treat your condition with:  Medicines.  Oxygen.  Lung surgery.  Changes to your diet.  Rehabilitation. This may involve a team of specialists. Follow these instructions at home:  Take all medicines as told by your doctor.  Avoid medicines or cough syrups that dry up your airway (such as antihistamines) and do not allow you to get rid of thick spit. You do not need to avoid them if told differently by your doctor.  If you smoke, stop. Smoking makes the problem worse.  Avoid being around things that make your breathing worse (like smoke, chemicals, and fumes).  Use oxygen therapy and therapy to help improve your lungs (pulmonary rehabilitation) if told by your doctor. If you need home oxygen therapy, ask your doctor if you should buy a tool to measure your oxygen level (oximeter).  Avoid people who have a sickness you can catch (contagious).  Avoid going outside when it is very hot, cold, or humid.  Eat healthy foods. Eat smaller meals more often. Rest before meals.  Stay active, but remember to also rest.  Make sure to get all the shots (vaccines) your doctor recommends. Ask your doctor if you need a pneumonia shot.  Learn and use tips on how to relax.  Learn and use tips on how to control your breathing as told by your doctor. Try: 1. Breathing in (inhaling) through your nose for 1 second. Then, pucker your lips and breath out (exhale) through your lips for 2 seconds. 2. Putting one hand on your belly (abdomen). Breathe in slowly through  your nose for 1 second. Your hand on your belly should move out. Pucker your lips and breathe out slowly through your lips. Your hand on your belly should move in as you breathe out.  Learn and use controlled coughing to clear thick spit from your lungs. The steps are: 1. Lean your head a little forward. 2. Breathe in deeply. 3. Try to hold your breath for 3 seconds. 4. Keep your mouth slightly open while coughing 2 times. 5. Spit any thick spit out into a tissue. 6. Rest and do the steps again 1 or 2 times as needed. Contact a doctor if:  You cough up more thick spit than usual.  There is a change in the color or thickness of the spit.  It is harder to breathe than usual.  Your breathing is faster than usual. Get help right away if:  You have shortness of breath while resting.  You have shortness of breath that stops you from:  Being able to talk.  Doing normal activities.  You chest hurts for longer than 5 minutes.  Your skin color is more blue than usual.  Your pulse oximeter shows that you have low oxygen for longer than 5 minutes. This information is not intended to replace advice given to you by your health care provider. Make sure you discuss any questions you have with your health care provider. Document Released: 02/21/2008 Document Revised: 02/10/2016 Document Reviewed: 05/01/2013 Elsevier Interactive Patient Education  2017 Elsevier  Inc.  

## 2016-08-01 NOTE — Telephone Encounter (Signed)
Left message for pt to call back  °

## 2016-08-01 NOTE — Progress Notes (Signed)
[facility] Consultation      Date: 08/01/2016,   MRN# [id number] Toni [name] 1934-12-24 Code Status:  Hosp day:'@LENGTHOFSTAYDAYS'$ @ Referring MD: '@ATDPROV'$ @     PCP:   CHIEF COMPLAINT:   follow up for COPD   HISTORY OF PRESENT ILLNESS   81 yo pleasant white female with h/o COPD and Stage 1 SQ cell cancer dx in 2015 s/p Ct guided biopsy s/p RXT  OV on 5/31 needed prednisone taper-dx copd exacerbation   OV 08/01/16 SOB, wheezing, anxious, has been on prednisone last several days, had taken z pack as well Feels bad Family at bedside Family raises concern regarding dementia and cardiac meds Plan for more prednisone and cardiology assessment regarding flecainide    PFT 09/2013 Ratio 52% Fev1 49% DLCO 47%+BD response Airtrapping and Hyperinflation  Current Medication:  Current Outpatient Prescriptions:  .  albuterol (PROVENTIL HFA) 108 (90 Base) MCG/ACT inhaler, Inhale 2 puffs into the lungs every 4 (four) hours as needed for wheezing or shortness of breath., Disp: 1 Inhaler, Rfl: 0 .  albuterol (PROVENTIL HFA;VENTOLIN HFA) 108 (90 Base) MCG/ACT inhaler, Inhale 2 puffs into the lungs every 4 (four) hours as needed for wheezing or shortness of breath., Disp: 1 Inhaler, Rfl: 12 .  albuterol (PROVENTIL HFA;VENTOLIN HFA) 108 (90 Base) MCG/ACT inhaler, Inhale 2 puffs into the lungs every 4 (four) hours as needed for wheezing or shortness of breath., Disp: 1 Inhaler, Rfl: 2 .  budesonide (PULMICORT) 0.5 MG/2ML nebulizer solution, Take 2 mLs (0.5 mg total) by nebulization 2 (two) times daily., Disp: 120 mL, Rfl: 11 .  diltiazem (CARDIZEM CD) 300 MG 24 hr capsule, Take 300 mg by mouth daily., Disp: , Rfl:  .  flecainide (TAMBOCOR) 100 MG tablet, Take 1 tablet (100 mg total) by mouth 2 (two) times daily., Disp: 60 tablet, Rfl: 11 .  formoterol (PERFOROMIST) 20 MCG/2ML nebulizer solution, Take 2 mLs (20 mcg total) by nebulization 2 (two) times daily., Disp: 2 mL,  Rfl: 12 .  furosemide (LASIX) 40 MG tablet, Take 1 tablet (40 mg total) by mouth 2 (two) times daily., Disp: 60 tablet, Rfl: 0 .  ipratropium-albuterol (DUONEB) 0.5-2.5 (3) MG/3ML SOLN, Take 3 mLs by nebulization every 4 (four) hours as needed (for wheezing/shortness of breath)., Disp: , Rfl:  .  levothyroxine (SYNTHROID, LEVOTHROID) 75 MCG tablet, Take 75 mcg by mouth daily before breakfast., Disp: , Rfl:  .  loratadine (CLARITIN) 10 MG tablet, Take 10 mg by mouth daily., Disp: , Rfl:  .  omega-3 acid ethyl esters (LOVAZA) 1 g capsule, Take 1 g by mouth daily., Disp: , Rfl:  .  omeprazole (PRILOSEC) 20 MG capsule, Take 20 mg by mouth 2 (two) times daily before a meal., Disp: , Rfl:  .  potassium chloride (K-DUR,KLOR-CON) 10 MEQ tablet, Take 20 mEq by mouth daily., Disp: , Rfl:  .  predniSONE (STERAPRED UNI-PAK 21 TAB) 10 MG (21) TBPK tablet, Take 10 mg by mouth daily., Disp: , Rfl:     ALLERGIES   Belladonna alkaloids and Dabigatran     REVIEW OF SYSTEMS   Review of Systems  Constitutional: Negative for chills, fever and weight loss.  HENT: Negative for congestion.   Eyes: Negative for blurred vision.  Respiratory: Positive for shortness of breath and wheezing. Negative for cough, hemoptysis and sputum production.   Cardiovascular: Positive for orthopnea and leg swelling. Negative for chest pain and palpitations.  All other systems reviewed and are negative.  VS: BP 138/84 (BP Location: Right Arm, Cuff Size: Normal)   Pulse 62   Wt 194 lb (88 kg)   SpO2 95%   BMI 34.37 kg/m    PHYSICAL EXAM  Physical Exam  Constitutional: She is oriented to person, place, and time. She appears well-developed.  HENT:  Head: Normocephalic and atraumatic.  Eyes: Pupils are equal, round, and reactive to light.  Cardiovascular: Normal rate, regular rhythm and normal heart sounds.   No murmur heard. Pulmonary/Chest: No stridor. She is in respiratory distress. She has wheezes.    Musculoskeletal: She exhibits edema.  Neurological: She is alert and oriented to person, place, and time.  Skin: Skin is warm.  Psychiatric: She has a normal mood and affect.         ASSESSMENT/PLAN    81 yo white female with moderate/severe  COPD GOLD stage C with Chronic Hypoxic resp failure H/o Squamous cell carcinoma of lung T1 N1 M0 tumor status post radiation therapy with stereotactic surgery With acute MILD COPD exacerbation at this time  COPD (chronic obstructive pulmonary disease)- -will continue inhaled NEB medications(Performist and Budesonide) as prescribed-this seems to help her breathing -continue oxygen at night -start prednisone 40 mg daily for 7 days -dounebs every 4 hrs until wheezing stops Will need to consider daliresp at next visit  RUL patchy GGO/h/o RUL lung cancer -follow up oncology  Referral to Neurology for dementia Referral to cardiology to assess cardiac meds  Follow up in 2 weeks to assess resp status  The Patient requires high complexity decision making for assessment and support, frequent evaluation and titration of therapies, application of advanced monitoring technologies and extensive interpretation of multiple databases.   Patient/Family is satisfied with Plan of action and management.    Corrin Parker, M.D.  [facility]  Medical Director [place] Director [facility] Cardio-Pulmonary Department

## 2016-08-01 NOTE — Addendum Note (Signed)
Addended by: Oscar La R on: 08/01/2016 10:52 AM   Modules accepted: Orders

## 2016-08-01 NOTE — Telephone Encounter (Signed)
Pt's son reports that pt has been on flecainide 100 mg BID since 2015. Pt has SOB, wheezing, cough & tightness in her chest. He is unsure if sx are r/t COPD or are indeed a side effect of flecainide. Pt saw Dr. Mortimer Fries today, he recommends cardiology visit to discuss. Pt is sched to see Dr. Rockey Situ 11/20, but if any changes can be made before that time, he would appreciate a call.

## 2016-08-01 NOTE — Telephone Encounter (Signed)
Gave clarification for Prednisone. Nothing further needed.

## 2016-08-01 NOTE — Telephone Encounter (Signed)
PHarmacist called, needs clarification on Prednisone doseage

## 2016-08-02 NOTE — Telephone Encounter (Signed)
I suspect her SOB is from COPD exacerbation but happy to see her Richardson Landry, Could you look at her recent EKGs. Need to run these past you. thx TG

## 2016-08-03 ENCOUNTER — Ambulatory Visit (INDEPENDENT_AMBULATORY_CARE_PROVIDER_SITE_OTHER): Payer: Medicare Other | Admitting: Cardiovascular Disease

## 2016-08-03 ENCOUNTER — Encounter: Payer: Self-pay | Admitting: Cardiovascular Disease

## 2016-08-03 VITALS — BP 132/78 | HR 64 | Ht 63.0 in | Wt 194.5 lb

## 2016-08-03 DIAGNOSIS — I481 Persistent atrial fibrillation: Secondary | ICD-10-CM | POA: Diagnosis not present

## 2016-08-03 DIAGNOSIS — I5032 Chronic diastolic (congestive) heart failure: Secondary | ICD-10-CM | POA: Diagnosis not present

## 2016-08-03 DIAGNOSIS — N183 Chronic kidney disease, stage 3 unspecified: Secondary | ICD-10-CM

## 2016-08-03 DIAGNOSIS — Z7189 Other specified counseling: Secondary | ICD-10-CM

## 2016-08-03 DIAGNOSIS — R600 Localized edema: Secondary | ICD-10-CM

## 2016-08-03 DIAGNOSIS — E7800 Pure hypercholesterolemia, unspecified: Secondary | ICD-10-CM | POA: Diagnosis not present

## 2016-08-03 DIAGNOSIS — I4819 Other persistent atrial fibrillation: Secondary | ICD-10-CM

## 2016-08-03 DIAGNOSIS — I10 Essential (primary) hypertension: Secondary | ICD-10-CM

## 2016-08-03 DIAGNOSIS — J432 Centrilobular emphysema: Secondary | ICD-10-CM

## 2016-08-03 DIAGNOSIS — I350 Nonrheumatic aortic (valve) stenosis: Secondary | ICD-10-CM

## 2016-08-03 MED ORDER — APIXABAN 5 MG PO TABS: 5.0000 mg | ORAL_TABLET | Freq: Two times a day (BID) | ORAL | 6 refills | Status: DC

## 2016-08-03 MED ORDER — POTASSIUM CHLORIDE CRYS ER 10 MEQ PO TBCR: 20.0000 meq | EXTENDED_RELEASE_TABLET | Freq: Two times a day (BID) | ORAL | 6 refills | Status: DC

## 2016-08-03 MED ORDER — FUROSEMIDE 80 MG PO TABS: 80.0000 mg | ORAL_TABLET | Freq: Two times a day (BID) | ORAL | 11 refills | Status: DC

## 2016-08-03 NOTE — Telephone Encounter (Signed)
Per Dr. Rockey Situ, pt is back in afib, not on a blood thinner. Spoke w/ pt and advised her that he would like to see her to discuss. Pt added on to see Dr. Rockey Situ today @ 3:40.

## 2016-08-03 NOTE — Patient Instructions (Addendum)
Medication Instructions:  Please stop aspirin  Please start eliquis twice a day  Please take lasix 80 mg daily Take lasix twice a day 3 to 4 days a week (around 2 pm) Take with extra potassium (around 2 pm)  Labwork:  No new labs needed  Testing/Procedures:  No further testing at this time   I recommend watching educational videos on topics of interest to you at:       [email or web address]  Enter code: HEARTCARE    Follow-Up: It was a pleasure seeing you in the office today. Please call us if you have new issues that need to be addressed before your next appt.  [phone]  Your physician wants you to follow-up in: 1 month.    If you need a refill on your cardiac medications before your next appointment, please call your pharmacy.    You are scheduled to see Rufina Falco, NP Wednesday, 08/09/16 @ 3:45 Please arrive @ 3:15 Neurology w/ [facility] [phone]

## 2016-08-03 NOTE — Progress Notes (Signed)
Cardiology Office Note  Date:  08/03/2016   ID:  Toni [name], DOB 07/21/1935, MRN [id number]  PCP:  Pcp Not In System   Chief Complaint  Patient presents with  . other    Patient c/o back into A-fib and not on a blood thinner. Meds reviewed by the pt. verbally. Pt. c/o shortness of breath, feeling nervous, has bilateral leg pain/cramping at night.     HPI:  Toni [name] is a 81 yo woman with long smoking hx, COPD, hyperlipidemia, previous hospitalization on 11/15/2013 for shortness of breath. history of paroxysmal atrial fibrillation,  Hospitalization at [facility] 09/26/2013 for COPD exacerbation, atrial fibrillation. Noted to be in atrial flutter 09/12/2013 EKGs at the end of February 2015, beginning of March 2015 showing atrial flutter She presents today for routine follow-up of her atrial fibrillation/flutter She has completed radiation and chemotherapy for squamous cell lung cancer  Moderate aortic valve stenosis: 02/2016 She has chronic renal insufficiency  Previous history of skin tears on the Xarelto,  it was recommended that she try eliquis 5 mg twice a day  she did not start the new medication, it was too expensive   In follow-up today she presents with family, patient presents in a wheelchair She is in atrial fibrillation, noted on last EKG Case discussed with Dr. Caryl Comes. Family reports she has been taking her flecainide Worsening shortness of breath for at least one month Takes Lasix 80 mg every morning with potassium Recently seen by pulmonary, on inhalers, nebulizers Previous notes from pulmonary mention family concerned about dementia/memory issues  No recent falls History of a remote fall at [facility], hit her right side of the face, right knee, right arm . She does have ecchymosis on her arms  EKG shows atrial fibrillation with ventricular rate 64 bpm, U waves noted EKG reviewed with Dr. Caryl Comes  Other past medical history reviewed Bronchitis February 2015   11/15/13 - admitted with Afib RVR started on diltiazem drip treated with prednisone, Levaquin, nebulizers for COPD exacerbation 3/3 - placed on bipap 3/7 - started on flecainide for A flutter, started on anticoagulation  CT scan in December 2014 identified a lung nodule. Additional workup including biopsy has confirmed squamous cell lung cancer. radiation starting August 2015.   stopped smoking 10-12 years ago. Previous Total cholesterol 220, LDL 155. She does not want a cholesterol medication echocardiogram in the hospital last month showed ejection fraction 55-60%, moderately dilated left atrium and right atrium, mild aortic valve stenosis   PMH:   has a past medical history of Atypical atrial flutter ([place]) (11/16/2013); Cataract; Chronic diastolic CHF (congestive heart failure) ([place]); Chronic diastolic heart failure ([place]); Collagen vascular disease ([place]); COPD (chronic obstructive pulmonary disease) ([place]); Essential hypertension, benign; Hypokalemia; Hypothyroidism; Kidney disease, chronic, stage II (mild, EGFR 60+ ml/min); Lung cancer ([place]); Lung nodule; and PAF (paroxysmal atrial fibrillation) ([place]).  PSH:    Past Surgical History:  Procedure Laterality Date  . EYE SURGERY    . TOTAL KNEE ARTHROPLASTY Bilateral     Current Outpatient Prescriptions  Medication Sig Dispense Refill  . albuterol (PROVENTIL HFA) 108 (90 Base) MCG/ACT inhaler Inhale 2 puffs into the lungs every 4 (four) hours as needed for wheezing or shortness of breath. 1 Inhaler 0  . budesonide (PULMICORT) 0.5 MG/2ML nebulizer solution Take 2 mLs (0.5 mg total) by nebulization 2 (two) times daily. 120 mL 11  . diltiazem (CARDIZEM CD) 300 MG 24 hr capsule Take 300 mg by mouth daily.    Marland Kitchen  flecainide (TAMBOCOR) 100 MG tablet Take 1 tablet (100 mg total) by mouth 2 (two) times daily. 60 tablet 11  . formoterol (PERFOROMIST) 20 MCG/2ML nebulizer solution Take 2 mLs (20 mcg total) by nebulization 2 (two) times daily. 2  mL 12  . furosemide (LASIX) 80 MG tablet Take 1 tablet (80 mg total) by mouth 2 (two) times daily. 60 tablet 11  . ipratropium-albuterol (DUONEB) 0.5-2.5 (3) MG/3ML SOLN Take 3 mLs by nebulization every 4 (four) hours as needed (for wheezing/shortness of breath).    Marland Kitchen levothyroxine (SYNTHROID, LEVOTHROID) 75 MCG tablet Take 75 mcg by mouth daily before breakfast.    . loratadine (CLARITIN) 10 MG tablet Take 10 mg by mouth daily.    Marland Kitchen omega-3 acid ethyl esters (LOVAZA) 1 g capsule Take 1 g by mouth daily.    Marland Kitchen omeprazole (PRILOSEC) 20 MG capsule Take 20 mg by mouth 2 (two) times daily before a meal.    . potassium chloride (K-DUR,KLOR-CON) 10 MEQ tablet Take 2 tablets (20 mEq total) by mouth 2 (two) times daily. 120 tablet 6  . predniSONE (DELTASONE) 20 MG tablet Take 1 tablet (20 mg total) by mouth daily with breakfast. Take 2 tablets daily for 7 days 14 tablet 1  . predniSONE (STERAPRED UNI-PAK 21 TAB) 10 MG (21) TBPK tablet Take 10 mg by mouth daily.    Marland Kitchen apixaban (ELIQUIS) 5 MG TABS tablet Take 1 tablet (5 mg total) by mouth 2 (two) times daily. 60 tablet 6   No current facility-administered medications for this visit.      Allergies:   Belladonna alkaloids and Dabigatran   Social History:  The patient  reports that she quit smoking about 13 years ago. Her smoking use included Cigarettes. She has a 75.00 pack-year smoking history. She has never used smokeless tobacco. She reports that she does not drink alcohol or use drugs.   Family History:   family history includes Arrhythmia in her sister; CAD in her father; Heart attack in her father; Heart disease in her father.    Review of Systems: Review of Systems  Constitutional: Positive for malaise/fatigue.  Respiratory: Positive for shortness of breath.   Cardiovascular: Positive for leg swelling.  Gastrointestinal: Negative.   Musculoskeletal: Negative.   Neurological: Negative.   Psychiatric/Behavioral: Negative.        Memory  problems  All other systems reviewed and are negative.    PHYSICAL EXAM: VS:  BP 132/78 (BP Location: Left Arm, Patient Position: Sitting, Cuff Size: Normal)   Pulse 64   Ht 5' 3" (1.6 m)   Wt 194 lb 8 oz (88.2 kg)   BMI 34.45 kg/m  , BMI Body mass index is 34.45 kg/m. GEN: Well nourished, well developed, in no acute distress, presenting in a wheelchair  HEENT: normal  Neck: no JVD, carotid bruits, or masses Cardiac: RRR; no murmurs, rubs, or gallops, trace ankle swelling to mid shins Respiratory:  Mildly decreased breath sounds, developed the left base, wheezing noted, normal work of breathing GI: soft, nontender, nondistended, + BS Toni: no deformity or atrophy  Skin: warm and dry, no rash Neuro:  Strength and sensation are intact Psych: euthymic mood, full affect    Recent Labs: 02/18/2016: Magnesium 1.9 04/06/2016: ALT 11; BUN 25; Creatinine, Ser 1.27; Hemoglobin 11.4; Platelets 298; Potassium 3.8; Sodium 139    Lipid Panel Lab Results  Component Value Date   CHOL 220 (H) 11/16/2013   HDL 53 11/16/2013   LDLCALC 155 (H) 11/16/2013  TRIG 58 11/16/2013      Wt Readings from Last 3 Encounters:  08/03/16 194 lb 8 oz (88.2 kg)  08/01/16 194 lb (88 kg)  07/23/16 175 lb (79.4 kg)       ASSESSMENT AND PLAN:  Chronic diastolic CHF (congestive heart failure) (HCC) - Plan: EKG 12-Lead Recommended she take 80 mg twice a day 3 days per week otherwise 80 mg daily. Extra potassium for the p.m. Dose Worsening CHF symptoms likely from underlying atrial fibrillation Essential (primary) hypertension - Plan: EKG 12-Lead  Pure hypercholesterolemia She does not want cholesterol medication. Confirmed with her again today  Persistent atrial fibrillation ([place]) - Plan: EKG 12-Lead Long discussion concerning restarting anticoagulation Ideally would like 30 days of anticoagulation, and then cardioversion Would likely need to stay on anticoagulation given paroxysmal nature of her  arrhythmia We'll continue flecainide  Centrilobular emphysema ([place]) Recently seen by pulmonary. On inhalers, nebulizers Recent changes to regiment may by Dr. Mortimer Fries discussed with patient and family  Leg edema Worsening leg swelling today, will had extra Lasix after lunch 3 days per week. 80 twice a day on these days with extra potassium Likely exacerbated by underlying arrhythmia Long discussion concerning her diet, fluid intake, salt intake  Stage 3 chronic kidney disease We will see her back in one month, BMP at that time Previous lab work reviewed with family  Encounter for anticoagulation discussion and counseling Long discussion with patient and family concerning anticoagulation. We'll recommend she start eliquis. Samples and coupon provided   Total encounter time more than 40 minutes  Greater than 50% was spent in counseling and coordination of care with the patient   Disposition:   F/U  1 month   Orders Placed This Encounter  Procedures  . EKG 12-Lead     Signed, Esmond Plants, M.D., Ph.D. 08/03/2016  [place], [place]

## 2016-08-07 ENCOUNTER — Ambulatory Visit: Payer: Medicare Other | Admitting: Cardiovascular Disease

## 2016-08-15 ENCOUNTER — Encounter: Payer: Self-pay | Admitting: Pulmonary Disease

## 2016-08-15 ENCOUNTER — Ambulatory Visit (INDEPENDENT_AMBULATORY_CARE_PROVIDER_SITE_OTHER): Payer: Medicare Other | Admitting: Pulmonary Disease

## 2016-08-15 VITALS — BP 118/70 | HR 78 | Wt 189.0 lb

## 2016-08-15 DIAGNOSIS — J449 Chronic obstructive pulmonary disease, unspecified: Secondary | ICD-10-CM | POA: Diagnosis not present

## 2016-08-15 DIAGNOSIS — J441 Chronic obstructive pulmonary disease with (acute) exacerbation: Secondary | ICD-10-CM | POA: Diagnosis not present

## 2016-08-15 MED ORDER — IPRATROPIUM-ALBUTEROL 0.5-2.5 (3) MG/3ML IN SOLN: 3.0000 mL | RESPIRATORY_TRACT | 3 refills | Status: DC | PRN

## 2016-08-15 NOTE — Patient Instructions (Signed)
Resume your prior COPD medical regimen as we discussed  We will make referral to pulmonary rehab program after you find out whether [facility] has a program. Call here and Misty wil make the referral

## 2016-08-16 ENCOUNTER — Other Ambulatory Visit: Payer: Self-pay | Admitting: Nurse Practitioner

## 2016-08-16 DIAGNOSIS — R413 Other amnesia: Secondary | ICD-10-CM

## 2016-08-18 NOTE — Progress Notes (Signed)
PROBLEMS: COPD  Recent AECOPD  INTERVAL HISTORY: Seen by Dr Mortimer Fries 11/14 and treated for AECOPD with prednisone  SUBJ: Now back to baseline. No new complaints. Denies CP, fever, purulent sputum, hemoptysis, LE edema and calf tenderness. Still using duonebs every 4 hrs W/A as per Dr Zoila Shutter recs last visit  OBJ: Vitals:   08/15/16 1017  BP: 118/70  Pulse: 78  SpO2: 98%  Weight: 189 lb (85.7 kg)    Gen: NAD HEENT: WNL Neck: NO LAN, no JVD noted Lungs: No adventitious sounds Cardiovascular: Reg, no M noted Abdomen: Soft, NT +BS Ext: no C/C/E Neuro: no focal deficits  Skin: No lesions noted  DATA: No new labs or CXR  IMPRESSION: Moderate COPD Recent AECOPD - resolved  PLAN: Continue current COPD regimen Change DUonebs to q 4 hrs PRN  Referral made to Pulmonary Rehab per her request - [facility] if they have a program or [place] alternatively ROV 3 months with Dr Clydell Hakim, MD PCCM service Mobile [phone] Pager [phone] 08/18/2016

## 2016-08-22 ENCOUNTER — Telehealth: Payer: Self-pay | Admitting: Pulmonary Disease

## 2016-08-22 NOTE — Telephone Encounter (Signed)
Spoke with Leafy Ro with [facility], who had questions about a duoneb Rx that was sent in. Pt is currently doing pulmicort and perfrormist bid. We had sent in a Rx for duoneb q4h prn, [facility] pharmacist wanted to clarify that DS wanted pt on all three of these medications . Leafy Ro states they can fill this Rx for qh and prn if DS wants her one all three.  DS please advise. Thanks.

## 2016-08-22 NOTE — Telephone Encounter (Signed)
Toni [name] from [place] calling stating she'd like a call back  Would like to talk about nebulizer  Please call back

## 2016-08-25 ENCOUNTER — Ambulatory Visit
Admission: RE | Admit: 2016-08-25 | Discharge: 2016-08-25 | Disposition: A | Discharge status: Home / Self Care | Payer: Medicare Other | Source: Ambulatory Visit | Attending: Nurse Practitioner | Admitting: Nurse Practitioner

## 2016-08-25 DIAGNOSIS — R413 Other amnesia: Secondary | ICD-10-CM

## 2016-08-25 MED ORDER — GADOBENATE DIMEGLUMINE 529 MG/ML IV SOLN: 10.0000 mL | Freq: Once | INTRAVENOUS | Status: DC | PRN

## 2016-08-28 ENCOUNTER — Telehealth: Payer: Self-pay | Admitting: Pulmonary Disease

## 2016-08-28 MED ORDER — IPRATROPIUM-ALBUTEROL 0.5-2.5 (3) MG/3ML IN SOLN: 3.0000 mL | RESPIRATORY_TRACT | 3 refills | Status: DC | PRN

## 2016-08-28 NOTE — Telephone Encounter (Signed)
Patient is almost out of duoneb.  Please send updated rx to [facility] as this medication was changed at last ov.  Please also call patient spouse to discuss.

## 2016-08-28 NOTE — Telephone Encounter (Signed)
error 

## 2016-08-28 NOTE — Telephone Encounter (Signed)
Rx printed and placed in DS folder to be signed and fax to [facility]. Pt aware and voiced understanding. Nothing further needed at this time.

## 2016-08-28 NOTE — Telephone Encounter (Signed)
Yes. The Budesonide and formoterol are maintenance medications to be used on a schedule. The Duoneb is a rescue medication to be used "as needed" only  Toni [name]

## 2016-08-28 NOTE — Telephone Encounter (Signed)
I spoke with [facility] and made her aware of below message. Leafy Ro states she will get this message over to the pharmacist and will send [place] a order for these Rx. Nothing further needed.

## 2016-08-29 ENCOUNTER — Telehealth: Payer: Self-pay | Admitting: Pulmonary Disease

## 2016-08-29 MED ORDER — IPRATROPIUM-ALBUTEROL 0.5-2.5 (3) MG/3ML IN SOLN: 3.0000 mL | RESPIRATORY_TRACT | 3 refills | Status: DC | PRN

## 2016-08-29 NOTE — Telephone Encounter (Signed)
Pt has requested that duoneb be sent to [facility] instead of [facility] as they are having trouble getting this medication approved.  rx has been sent to preferred pharmacy per pt request. I ATC [facility] to make them aware of this and was told to call back after lunch.

## 2016-08-29 NOTE — Telephone Encounter (Signed)
Pt spouse calling asking for [place] to send in a refill for albuterol to [facility] Can't use [facility]  They are having some issues with that Please advise.

## 2016-09-05 NOTE — Telephone Encounter (Signed)
ATC [facility], was placed on a long hold. Wcb

## 2016-09-15 ENCOUNTER — Ambulatory Visit (INDEPENDENT_AMBULATORY_CARE_PROVIDER_SITE_OTHER): Payer: Medicare Other | Admitting: Cardiovascular Disease

## 2016-09-15 ENCOUNTER — Encounter: Payer: Self-pay | Admitting: Cardiovascular Disease

## 2016-09-15 VITALS — BP 122/64 | HR 68 | Ht 64.0 in | Wt 192.5 lb

## 2016-09-15 DIAGNOSIS — J432 Centrilobular emphysema: Secondary | ICD-10-CM

## 2016-09-15 DIAGNOSIS — I10 Essential (primary) hypertension: Secondary | ICD-10-CM

## 2016-09-15 DIAGNOSIS — I480 Paroxysmal atrial fibrillation: Secondary | ICD-10-CM | POA: Diagnosis not present

## 2016-09-15 DIAGNOSIS — Z7189 Other specified counseling: Secondary | ICD-10-CM

## 2016-09-15 DIAGNOSIS — I350 Nonrheumatic aortic (valve) stenosis: Secondary | ICD-10-CM

## 2016-09-15 DIAGNOSIS — I5032 Chronic diastolic (congestive) heart failure: Secondary | ICD-10-CM | POA: Diagnosis not present

## 2016-09-15 DIAGNOSIS — E782 Mixed hyperlipidemia: Secondary | ICD-10-CM

## 2016-09-15 NOTE — Patient Instructions (Signed)

## 2016-09-15 NOTE — Progress Notes (Signed)
Cardiology Office Note  Date:  09/15/2016   ID:  Toni [name], DOB 1935-08-22, MRN [id number]  PCP:  Pcp Not In System   Chief Complaint  Patient presents with  . other    1 month follow up for A-Fib. Meds reviewed by the pt. verbally. "doing well."     HPI:  Toni [name] is a 81yo woman with long smoking hx, COPD, hyperlipidemia, previous hospitalization on 11/15/2013 for shortness of breath. history of paroxysmal atrial fibrillation,  Hospitalization at [facility] 09/26/2013 for COPD exacerbation, atrial fibrillation. Noted to be in atrial flutter 09/12/2013 EKGs at the end of February 2015, beginning of March 2015 showing atrial flutter She presents today for routine follow-up of her atrial fibrillation/flutter She has completed radiation and chemotherapy for squamous cell lung cancer  Moderate aortic valve stenosis: 02/2016 She has chronic renal insufficiency  On her last clinic visit she had atrial fibrillation She was started on eliquis 5 mg twice a day  She has been taking Lasix 80 mg daily with twice a day dosing 3 days per week Takes potassium daily with twice a day dosing 3 days per week Leg edema has resolved, shortness of breath improved Overall in good spirits Son and husband do all of her medications  Family reports she has been taking her flecainide Previously seen by pulmonary, on inhalers, nebulizers Previous notes from pulmonary mention family concerned about dementia/memory issues  No recent falls History of a remote fall at [facility], hit her right side of the face, right knee, right arm .  EKG on today's visit shows normal sinus rhythm with first-degree AV block, no significant ST-T wave changes, prolonged QTC 495  Other past medical history reviewed Bronchitis February 2015  11/15/13 - admitted with Afib RVR started on diltiazem drip treated with prednisone, Levaquin, nebulizers for COPD exacerbation 3/3 - placed on bipap 3/7 - started on flecainide for A  flutter, started on anticoagulation  CT scan in December 2014 identified a lung nodule. Additional workup including biopsy has confirmed squamous cell lung cancer. radiation starting August 2015.   stopped smoking 10-12 years ago. Previous Total cholesterol 220, LDL 155. 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  PSH:    Past Surgical History:  Procedure Laterality Date  . EYE SURGERY    . TOTAL KNEE ARTHROPLASTY Bilateral     Current Outpatient Prescriptions  Medication Sig Dispense Refill  . albuterol (PROVENTIL HFA) 108 (90 Base) MCG/ACT inhaler Inhale 2 puffs into the lungs every 4 (four) hours as needed for wheezing or shortness of breath. 1 Inhaler 0  . apixaban (ELIQUIS) 5 MG TABS tablet Take 1 tablet (5 mg total) by mouth 2 (two) times daily. 60 tablet 6  . budesonide (PULMICORT) 0.5 MG/2ML nebulizer solution Take 2 mLs (0.5 mg total) by nebulization 2 (two) times daily. 120 mL 11  . diltiazem (CARDIZEM CD) 300 MG 24 hr capsule Take 300 mg by mouth daily.    . flecainide (TAMBOCOR) 100 MG tablet Take 1  tablet (100 mg total) by mouth 2 (two) times daily. 60 tablet 11  . formoterol (PERFOROMIST) 20 MCG/2ML nebulizer solution Take 2 mLs (20 mcg total) by nebulization 2 (two) times daily. 2 mL 12  . furosemide (LASIX) 80 MG tablet Take 1 tablet (80 mg total) by mouth 2  (two) times daily. 60 tablet 11  . ipratropium-albuterol (DUONEB) 0.5-2.5 (3) MG/3ML SOLN Take 3 mLs by nebulization every 4 (four) hours as needed (for wheezing/shortness of breath). DX Code: J44.9   DX: COPD 360 mL 3  . levothyroxine (SYNTHROID, LEVOTHROID) 75 MCG tablet Take 75 mcg by mouth daily before breakfast.    . loratadine (CLARITIN) 10 MG tablet Take 10 mg by mouth daily.    Marland Kitchen omega-3 acid ethyl esters (LOVAZA) 1 g capsule Take 1 g by mouth daily.    Marland Kitchen omeprazole (PRILOSEC) 20 MG capsule Take 20 mg by mouth 2 (two) times daily before a meal.    . potassium chloride (K-DUR,KLOR-CON) 10 MEQ tablet Take 2 tablets (20 mEq total) by mouth 2 (two) times daily. 120 tablet 6   No current facility-administered medications for this visit.      Allergies:   Belladonna alkaloids and Dabigatran   Social History:  The patient  reports that she quit smoking about 14 years ago. Her smoking use included Cigarettes. She has a 75.00 pack-year smoking history. She has never used smokeless tobacco. She reports that she does not drink alcohol or use drugs.   Family History:   family history includes Arrhythmia in her sister; CAD in her father; Heart attack in her father; Heart disease in her father.    Review of Systems: Review of Systems  Constitutional: Negative.   Respiratory: Positive for shortness of breath.   Cardiovascular: Negative.   Gastrointestinal: Negative.   Musculoskeletal: Negative.        Leg weakness  Neurological: Negative.   Psychiatric/Behavioral: Positive for memory loss.  All other systems reviewed and are negative.    PHYSICAL EXAM: VS:  BP 122/64 (BP Location: Left Arm, Patient Position: Sitting, Cuff Size: Normal)   Pulse 68   Ht 5' 4"  (1.626 m)   Wt 192 lb 8 oz (87.3 kg)   BMI 33.04 kg/m  , BMI Body mass index is 33.04 kg/m. GEN: Well nourished, well developed, in no acute distress  HEENT: normal  Neck: no JVD, carotid bruits, or masses Cardiac: RRR; no  murmurs, rubs, or gallops,no edema  Respiratory:  clear to auscultation bilaterally, normal work of breathing GI: soft, nontender, nondistended, + BS Toni: no deformity or atrophy  Skin: warm and dry, no rash Neuro:  Strength and sensation are intact Psych: euthymic mood, full affect    Recent Labs: 02/18/2016: Magnesium 1.9 04/06/2016: ALT 11; BUN 25; Creatinine, Ser 1.27; Hemoglobin 11.4; Platelets 298; Potassium 3.8; Sodium 139    Lipid Panel Lab Results  Component Value Date   CHOL 220 (H) 11/16/2013   HDL 53 11/16/2013   LDLCALC 155 (H) 11/16/2013   TRIG 58 11/16/2013      Wt Readings from Last 3 Encounters:  09/15/16 192 lb 8 oz (87.3 kg)  08/15/16 189 lb (85.7 kg)  08/03/16 194 lb 8 oz (88.2 kg)       ASSESSMENT AND PLAN:  Chronic diastolic CHF (congestive heart failure) ([place]) - Plan: EKG 12-Lead Long discussion concerning her diuretic regiment We'll continue with diuretics as she is currently taking as detailed above Continue potassium as detailed above Appears euvolemic Recommend  she have labwork done through primary care, BMP  Essential (primary) hypertension - Plan: EKG 12-Lead Blood pressure is well controlled on today's visit. No changes made to the medications.  Paroxysmal atrial fibrillation (HCC) - Plan: EKG 12-Lead Normal sinus rhythm on today's visit. We'll continue anticoagulation, flecainide  Mixed hyperlipidemia Currently not on a statin  Aortic valve stenosis, moderate Will need periodic echocardiogram likely every 2 years  Centrilobular emphysema (HCC) Reports breathing is stable, currently not on oxygen  Encounter for anticoagulation discussion and counseling Long discussion concerning anticoagulation. She is willing to stay on eliquis. Discussed risk and benefit of anticoagulation, including risk of stroke   Total encounter time more than 25 minutes  Greater than 50% was spent in counseling and coordination of care with the  patient   Disposition:   F/U  6 months   Orders Placed This Encounter  Procedures  . EKG 12-Lead     Signed, Esmond Plants, M.D., Ph.D. 09/15/2016  [place], [place]

## 2016-10-09 NOTE — Progress Notes (Signed)
[facility]  [phone]  [phone]     Toni [name] DOB: 11/02/1934  MR#: [id number]  CSN#:[id number]  Patient Care Team: Pcp Not In System as PCP - General  J Gollan, MD as Consulting Physician (Cardiology)  CHIEF COMPLAINT: Right upper lobe squamous cell carcinoma of the lung.  INTERVAL HISTORY: Patient returns to clinic today for routine follow-up for the above-stated lung cancer. She has chronic weakness and fatigue that is unchanged. She has chronic shortness of breath requiring oxygen. He has no neurologic complaints. She denies any recent fevers or illnesses. He denies any chest pain or hemoptysis. She has no nausea, vomiting, constipation, or diarrhea. She has no urinary complaints. Patient otherwise feels well and offers no further specific complaints.   REVIEW OF SYSTEMS:   Review of Systems  Constitutional: Positive for malaise/fatigue. Negative for fever and weight loss.  Respiratory: Positive for cough. Negative for hemoptysis.   Cardiovascular: Negative.  Negative for chest pain and leg swelling.  Gastrointestinal: Negative.  Negative for abdominal pain, blood in stool and melena.  Genitourinary: Negative.   Musculoskeletal: Negative.   Neurological: Positive for weakness.  Psychiatric/Behavioral: Negative.  The patient is not nervous/anxious.     As per HPI. Otherwise, a complete review of systems is negative.  ONCOLOGY HISTORY:  No history exists.    PAST MEDICAL HISTORY: Past Medical History:  Diagnosis Date  . Atypical atrial flutter (HCC) 11/16/2013   a. CHA2DS2VASc = 5-->prev on xarelto, d/c'd due to bleeding skin tear;  c. 11/2013->Placed on flecainide.  . Cataract   . Chronic diastolic CHF (congestive heart failure) (HCC)    a. 11/2013 Echo: EF 55-60%, mild AS/MR, mod dil LA/RA, PASP 32mmHg.  . Chronic diastolic heart failure (HCC)   . Collagen vascular disease (HCC)   . COPD (chronic obstructive pulmonary  disease) (HCC)   . Essential hypertension, benign   . Hypokalemia   . Hypothyroidism   . 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 Lung cancer (HCC)   . Lung nodule    Followed by Dr. Fleming  . PAF (paroxysmal atrial fibrillation) (HCC)    a. 11/2013 Echo: EF 55-60%, mild AS/MR, mod dil LA/RA, PASP 32mmHg;  b. CHA2DS2VASc = 5-->prev on xarelto, d/c'd due to bleeding skin tear;  c. 11/2013->Placed on flecainide.    PAST SURGICAL HISTORY: Past Surgical History:  Procedure Laterality Date  . EYE SURGERY    . TOTAL KNEE ARTHROPLASTY Bilateral     FAMILY HISTORY Family History  Problem Relation Age of Onset  . CAD Father   . Heart disease Father   . Heart attack Father   . Arrhythmia Sister     GYNECOLOGIC HISTORY:  No LMP recorded. Patient is postmenopausal.     ADVANCED DIRECTIVES:    HEALTH MAINTENANCE: Social History  Substance Use Topics  . Smoking status: Former Smoker    Packs/day: 1.50    Years: 50.00    Types: Cigarettes    Quit date: 09/18/2002  . Smokeless tobacco: Never Used  . Alcohol use No    Allergies  Allergen Reactions  . Belladonna Alkaloids Anaphylaxis and Other (See Comments)    Pt states that this medication makes her eyes cross.    . Dabigatran Other (See Comments)    Current Outpatient Prescriptions  Medication Sig Dispense Refill  . albuterol (PROVENTIL HFA) 108 (90 Base) MCG/ACT inhaler Inhale 2 puffs into the lungs every 4 (four) hours as needed   for wheezing or shortness of breath. 1 Inhaler 0  . apixaban (ELIQUIS) 5 MG TABS tablet Take 1 tablet (5 mg total) by mouth 2 (two) times daily. 60 tablet 6  . budesonide (PULMICORT) 0.5 MG/2ML nebulizer solution Take 2 mLs (0.5 mg total) by nebulization 2 (two) times daily. 120 mL 11  . cyanocobalamin (,VITAMIN B-12,) 1000 MCG/ML injection Vit B12 1000 mcg IM once a week for 4 weeks then once a month for 4 months    . diltiazem (CARDIZEM CD) 300 MG 24 hr capsule Take 300 mg by  mouth daily.    . flecainide (TAMBOCOR) 100 MG tablet Take 1 tablet (100 mg total) by mouth 2 (two) times daily. 60 tablet 11  . formoterol (PERFOROMIST) 20 MCG/2ML nebulizer solution Take 2 mLs (20 mcg total) by nebulization 2 (two) times daily. 2 mL 12  . furosemide (LASIX) 80 MG tablet Take 1 tablet (80 mg total) by mouth 2 (two) times daily. (Patient taking differently: Take 80 mg by mouth daily. ) 60 tablet 11  . ipratropium-albuterol (DUONEB) 0.5-2.5 (3) MG/3ML SOLN Take 3 mLs by nebulization every 4 (four) hours as needed (for wheezing/shortness of breath). DX Code: J44.9   DX: COPD 360 mL 3  . levothyroxine (SYNTHROID, LEVOTHROID) 75 MCG tablet Take 75 mcg by mouth daily before breakfast.    . omega-3 acid ethyl esters (LOVAZA) 1 g capsule Take 1 g by mouth daily.    . potassium chloride (K-DUR,KLOR-CON) 10 MEQ tablet Take 2 tablets (20 mEq total) by mouth 2 (two) times daily. 120 tablet 6  . loratadine (CLARITIN) 10 MG tablet Take 10 mg by mouth daily.    . omeprazole (PRILOSEC) 20 MG capsule Take 20 mg by mouth 2 (two) times daily before a meal.     No current facility-administered medications for this visit.     OBJECTIVE: BP (!) 153/71 (BP Location: Other (Comment), Patient Position: Sitting) Comment (BP Location): left forearm  Pulse 65   Temp 98.3 F (36.8 C) (Tympanic)   Wt 191 lb 5.8 oz (86.8 kg)   SpO2 99%   BMI 32.85 kg/m    Body mass index is 32.85 kg/m.    ECOG FS:1 - Symptomatic but completely ambulatory  General: Well-developed, well-nourished, no acute distress. Eyes: Pink conjunctiva, anicteric sclera. Lungs: Diminished breath sounds, no wheezing Heart: Irregular Abdomen: Soft, nontender, nondistended.  Musculoskeletal: No edema, cyanosis, or clubbing. Neuro: Alert, answering all questions appropriately.  Skin: No rashes or petechiae noted. Psych: Normal affect. Lymphatics: No cervical, calvicular, axillary LAD.   LAB RESULTS:  No visits with results  within 3 Day(s) from this visit.  Latest known visit with results is:  Appointment on 04/06/2016  Component Date Value Ref Range Status  . WBC 04/06/2016 6.5  3.6 - 11.0 K/uL Final  . RBC 04/06/2016 3.80  3.80 - 5.20 MIL/uL Final  . Hemoglobin 04/06/2016 11.4* 12.0 - 16.0 g/dL Final  . HCT 04/06/2016 34.2* 35.0 - 47.0 % Final  . MCV 04/06/2016 90.2  80.0 - 100.0 fL Final  . MCH 04/06/2016 29.9  26.0 - 34.0 pg Final  . MCHC 04/06/2016 33.2  32.0 - 36.0 g/dL Final  . RDW 04/06/2016 16.6* 11.5 - 14.5 % Final  . Platelets 04/06/2016 298  150 - 440 K/uL Final  . Neutrophils Relative % 04/06/2016 60  % Final  . Neutro Abs 04/06/2016 3.9  1.4 - 6.5 K/uL Final  . Lymphocytes Relative 04/06/2016 26  % Final  .   Lymphs Abs 04/06/2016 1.7  1.0 - 3.6 K/uL Final  . Monocytes Relative 04/06/2016 10  % Final  . Monocytes Absolute 04/06/2016 0.7  0.2 - 0.9 K/uL Final  . Eosinophils Relative 04/06/2016 4  % Final  . Eosinophils Absolute 04/06/2016 0.3  0 - 0.7 K/uL Final  . Basophils Relative 04/06/2016 0  % Final  . Basophils Absolute 04/06/2016 0.0  0 - 0.1 K/uL Final  . Sodium 04/06/2016 139  135 - 145 mmol/L Final  . Potassium 04/06/2016 3.8  3.5 - 5.1 mmol/L Final  . Chloride 04/06/2016 101  101 - 111 mmol/L Final  . CO2 04/06/2016 30  22 - 32 mmol/L Final  . Glucose, Bld 04/06/2016 101* 65 - 99 mg/dL Final  . BUN 04/06/2016 25* 6 - 20 mg/dL Final  . Creatinine, Ser 04/06/2016 1.27* 0.44 - 1.00 mg/dL Final  . Calcium 04/06/2016 8.8* 8.9 - 10.3 mg/dL Final  . Total Protein 04/06/2016 6.9  6.5 - 8.1 g/dL Final  . Albumin 04/06/2016 4.1  3.5 - 5.0 g/dL Final  . AST 04/06/2016 15  15 - 41 U/L Final  . ALT 04/06/2016 11* 14 - 54 U/L Final  . Alkaline Phosphatase 04/06/2016 76  38 - 126 U/L Final  . Total Bilirubin 04/06/2016 0.5  0.3 - 1.2 mg/dL Final  . GFR calc non Af Amer 04/06/2016 39* >60 mL/min Final  . GFR calc Af Amer 04/06/2016 45* >60 mL/min Final   Comment: (NOTE) The eGFR has been  calculated using the CKD EPI equation. This calculation has not been validated in all clinical situations. eGFR's persistently <60 mL/min signify possible Chronic Kidney Disease.   . Anion gap 04/06/2016 8  5 - 15 Final    STUDIES: No results found.  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 Right upper lobe squamous cell carcinoma of the lung: Patient was initially diagnosed in July 2015. She completed stereotactic XRT in September 2015. Her most recent CT scan on Feb 16, 2016 did not report any evidence of progressive or recurrent disease. Will repeat CT scan in May 2018 with follow-up 1-2 days later. Will continue with yearly CT scans until patient is 5 year removed from her treatment at which time imaging can be discontinued. 2. History of CHF: Continue evaluation and treatment per cardiology.   Patient expressed understanding and was in agreement with this plan. She also understands that She can call clinic at any time with any questions, concerns, or complaints.    Lloyd Huger, MD   10/13/2016 8:47 AM

## 2016-10-10 ENCOUNTER — Inpatient Hospital Stay: Payer: Medicare Other | Attending: Oncology | Admitting: Oncology

## 2016-10-10 VITALS — BP 153/71 | HR 65 | Temp 98.3°F | Wt 191.4 lb

## 2016-10-10 DIAGNOSIS — C3491 Malignant neoplasm of unspecified part of right bronchus or lung: Secondary | ICD-10-CM

## 2016-10-10 DIAGNOSIS — J449 Chronic obstructive pulmonary disease, unspecified: Secondary | ICD-10-CM | POA: Insufficient documentation

## 2016-10-10 DIAGNOSIS — Z87891 Personal history of nicotine dependence: Secondary | ICD-10-CM | POA: Insufficient documentation

## 2016-10-10 DIAGNOSIS — E876 Hypokalemia: Secondary | ICD-10-CM | POA: Insufficient documentation

## 2016-10-10 DIAGNOSIS — C3411 Malignant neoplasm of upper lobe, right bronchus or lung: Secondary | ICD-10-CM | POA: Diagnosis present

## 2016-10-10 DIAGNOSIS — E039 Hypothyroidism, unspecified: Secondary | ICD-10-CM | POA: Diagnosis not present

## 2016-10-10 DIAGNOSIS — Z79899 Other long term (current) drug therapy: Secondary | ICD-10-CM | POA: Diagnosis not present

## 2016-10-10 DIAGNOSIS — Z923 Personal history of irradiation: Secondary | ICD-10-CM | POA: Diagnosis not present

## 2016-10-10 DIAGNOSIS — I480 Paroxysmal atrial fibrillation: Secondary | ICD-10-CM | POA: Insufficient documentation

## 2016-10-10 DIAGNOSIS — I110 Hypertensive heart disease with heart failure: Secondary | ICD-10-CM | POA: Diagnosis not present

## 2016-10-10 NOTE — Progress Notes (Signed)
Patient here for follow up visit.  Vomiting upon arrival, chills noted as well.  Placed patient on 2 liters O2

## 2016-11-03 ENCOUNTER — Inpatient Hospital Stay
Admission: EM | Admit: 2016-11-03 | Discharge: 2016-11-19 | DRG: 286 | Disposition: A | Discharge status: Home Health | Payer: Medicare Other | Attending: Internal Medicine | Admitting: Internal Medicine

## 2016-11-03 ENCOUNTER — Encounter: Payer: Self-pay | Admitting: Emergency Medicine

## 2016-11-03 ENCOUNTER — Emergency Department: Payer: Medicare Other

## 2016-11-03 ENCOUNTER — Telehealth: Payer: Self-pay | Admitting: Internal Medicine

## 2016-11-03 DIAGNOSIS — I5033 Acute on chronic diastolic (congestive) heart failure: Secondary | ICD-10-CM | POA: Diagnosis present

## 2016-11-03 DIAGNOSIS — F4322 Adjustment disorder with anxiety: Secondary | ICD-10-CM | POA: Diagnosis present

## 2016-11-03 DIAGNOSIS — I493 Ventricular premature depolarization: Secondary | ICD-10-CM | POA: Diagnosis not present

## 2016-11-03 DIAGNOSIS — Z8249 Family history of ischemic heart disease and other diseases of the circulatory system: Secondary | ICD-10-CM

## 2016-11-03 DIAGNOSIS — I10 Essential (primary) hypertension: Secondary | ICD-10-CM | POA: Diagnosis not present

## 2016-11-03 DIAGNOSIS — D631 Anemia in chronic kidney disease: Secondary | ICD-10-CM | POA: Diagnosis present

## 2016-11-03 DIAGNOSIS — I080 Rheumatic disorders of both mitral and aortic valves: Secondary | ICD-10-CM | POA: Diagnosis present

## 2016-11-03 DIAGNOSIS — I272 Pulmonary hypertension, unspecified: Secondary | ICD-10-CM | POA: Diagnosis present

## 2016-11-03 DIAGNOSIS — K5900 Constipation, unspecified: Secondary | ICD-10-CM | POA: Diagnosis present

## 2016-11-03 DIAGNOSIS — J189 Pneumonia, unspecified organism: Secondary | ICD-10-CM | POA: Diagnosis present

## 2016-11-03 DIAGNOSIS — M549 Dorsalgia, unspecified: Secondary | ICD-10-CM | POA: Diagnosis present

## 2016-11-03 DIAGNOSIS — E669 Obesity, unspecified: Secondary | ICD-10-CM | POA: Diagnosis present

## 2016-11-03 DIAGNOSIS — J440 Chronic obstructive pulmonary disease with acute lower respiratory infection: Secondary | ICD-10-CM | POA: Diagnosis present

## 2016-11-03 DIAGNOSIS — Z9981 Dependence on supplemental oxygen: Secondary | ICD-10-CM | POA: Diagnosis not present

## 2016-11-03 DIAGNOSIS — Z7189 Other specified counseling: Secondary | ICD-10-CM | POA: Diagnosis not present

## 2016-11-03 DIAGNOSIS — Z96653 Presence of artificial knee joint, bilateral: Secondary | ICD-10-CM | POA: Diagnosis present

## 2016-11-03 DIAGNOSIS — Z66 Do not resuscitate: Secondary | ICD-10-CM | POA: Diagnosis not present

## 2016-11-03 DIAGNOSIS — N039 Chronic nephritic syndrome with unspecified morphologic changes: Secondary | ICD-10-CM | POA: Diagnosis not present

## 2016-11-03 DIAGNOSIS — Z87891 Personal history of nicotine dependence: Secondary | ICD-10-CM

## 2016-11-03 DIAGNOSIS — I5032 Chronic diastolic (congestive) heart failure: Secondary | ICD-10-CM | POA: Diagnosis present

## 2016-11-03 DIAGNOSIS — N189 Chronic kidney disease, unspecified: Secondary | ICD-10-CM | POA: Diagnosis not present

## 2016-11-03 DIAGNOSIS — E039 Hypothyroidism, unspecified: Secondary | ICD-10-CM | POA: Diagnosis present

## 2016-11-03 DIAGNOSIS — Z888 Allergy status to other drugs, medicaments and biological substances status: Secondary | ICD-10-CM

## 2016-11-03 DIAGNOSIS — K449 Diaphragmatic hernia without obstruction or gangrene: Secondary | ICD-10-CM | POA: Diagnosis present

## 2016-11-03 DIAGNOSIS — R600 Localized edema: Secondary | ICD-10-CM | POA: Diagnosis not present

## 2016-11-03 DIAGNOSIS — Z6831 Body mass index (BMI) 31.0-31.9, adult: Secondary | ICD-10-CM

## 2016-11-03 DIAGNOSIS — Z515 Encounter for palliative care: Secondary | ICD-10-CM | POA: Diagnosis not present

## 2016-11-03 DIAGNOSIS — Z7901 Long term (current) use of anticoagulants: Secondary | ICD-10-CM

## 2016-11-03 DIAGNOSIS — D649 Anemia, unspecified: Secondary | ICD-10-CM | POA: Diagnosis not present

## 2016-11-03 DIAGNOSIS — Z79899 Other long term (current) drug therapy: Secondary | ICD-10-CM

## 2016-11-03 DIAGNOSIS — Z85118 Personal history of other malignant neoplasm of bronchus and lung: Secondary | ICD-10-CM

## 2016-11-03 DIAGNOSIS — N183 Chronic kidney disease, stage 3 (moderate): Secondary | ICD-10-CM | POA: Diagnosis present

## 2016-11-03 DIAGNOSIS — G2581 Restless legs syndrome: Secondary | ICD-10-CM | POA: Diagnosis present

## 2016-11-03 DIAGNOSIS — I509 Heart failure, unspecified: Secondary | ICD-10-CM | POA: Diagnosis not present

## 2016-11-03 DIAGNOSIS — K219 Gastro-esophageal reflux disease without esophagitis: Secondary | ICD-10-CM | POA: Diagnosis present

## 2016-11-03 DIAGNOSIS — J9621 Acute and chronic respiratory failure with hypoxia: Secondary | ICD-10-CM | POA: Diagnosis present

## 2016-11-03 DIAGNOSIS — N179 Acute kidney failure, unspecified: Secondary | ICD-10-CM | POA: Diagnosis present

## 2016-11-03 DIAGNOSIS — R05 Cough: Secondary | ICD-10-CM | POA: Diagnosis not present

## 2016-11-03 DIAGNOSIS — I350 Nonrheumatic aortic (valve) stenosis: Secondary | ICD-10-CM | POA: Diagnosis not present

## 2016-11-03 DIAGNOSIS — I5031 Acute diastolic (congestive) heart failure: Secondary | ICD-10-CM | POA: Diagnosis not present

## 2016-11-03 DIAGNOSIS — I480 Paroxysmal atrial fibrillation: Secondary | ICD-10-CM | POA: Diagnosis present

## 2016-11-03 DIAGNOSIS — R059 Cough, unspecified: Secondary | ICD-10-CM

## 2016-11-03 DIAGNOSIS — I481 Persistent atrial fibrillation: Secondary | ICD-10-CM | POA: Diagnosis not present

## 2016-11-03 DIAGNOSIS — M6281 Muscle weakness (generalized): Secondary | ICD-10-CM

## 2016-11-03 DIAGNOSIS — I130 Hypertensive heart and chronic kidney disease with heart failure and stage 1 through stage 4 chronic kidney disease, or unspecified chronic kidney disease: Secondary | ICD-10-CM | POA: Diagnosis present

## 2016-11-03 DIAGNOSIS — J441 Chronic obstructive pulmonary disease with (acute) exacerbation: Secondary | ICD-10-CM | POA: Diagnosis present

## 2016-11-03 DIAGNOSIS — Z7951 Long term (current) use of inhaled steroids: Secondary | ICD-10-CM

## 2016-11-03 LAB — CBC
HCT: 27.9 % — ABNORMAL LOW (ref 35.0–47.0)
Hemoglobin: 9.4 g/dL — ABNORMAL LOW (ref 12.0–16.0)
MCH: 29.3 pg (ref 26.0–34.0)
MCHC: 33.6 g/dL (ref 32.0–36.0)
MCV: 87.1 fL (ref 80.0–100.0)
PLATELETS: 243 10*3/uL (ref 150–440)
RBC: 3.21 MIL/uL — ABNORMAL LOW (ref 3.80–5.20)
RDW: 15.2 % — AB (ref 11.5–14.5)
WBC: 7.8 10*3/uL (ref 3.6–11.0)

## 2016-11-03 LAB — COMPREHENSIVE METABOLIC PANEL
ALBUMIN: 3.8 g/dL (ref 3.5–5.0)
ALT: 11 U/L — ABNORMAL LOW (ref 14–54)
ANION GAP: 10 (ref 5–15)
AST: 15 U/L (ref 15–41)
Alkaline Phosphatase: 85 U/L (ref 38–126)
BUN: 33 mg/dL — ABNORMAL HIGH (ref 6–20)
CALCIUM: 8.8 mg/dL — AB (ref 8.9–10.3)
CO2: 35 mmol/L — AB (ref 22–32)
Chloride: 94 mmol/L — ABNORMAL LOW (ref 101–111)
Creatinine, Ser: 1.5 mg/dL — ABNORMAL HIGH (ref 0.44–1.00)
GFR calc non Af Amer: 32 mL/min — ABNORMAL LOW (ref >60)
GFR, EST AFRICAN AMERICAN: 37 mL/min — AB (ref >60)
Glucose, Bld: 109 mg/dL — ABNORMAL HIGH (ref 65–99)
POTASSIUM: 3.8 mmol/L (ref 3.5–5.1)
SODIUM: 139 mmol/L (ref 135–145)
Total Bilirubin: 0.9 mg/dL (ref 0.3–1.2)
Total Protein: 6.9 g/dL (ref 6.5–8.1)

## 2016-11-03 LAB — TROPONIN I
Troponin I: 0.03 ng/mL (ref <0.03)
Troponin I: <0.03 ng/mL (ref <0.03)

## 2016-11-03 LAB — BRAIN NATRIURETIC PEPTIDE: B NATRIURETIC PEPTIDE 5: 338 pg/mL — AB (ref 0.0–100.0)

## 2016-11-03 MED ORDER — DILTIAZEM HCL ER COATED BEADS 180 MG PO CP24
300.0000 mg | ORAL_CAPSULE | Freq: Every day | ORAL | Status: DC
  Administered 2016-11-04 – 2016-11-08 (×5): 300 mg via ORAL
  Filled 2016-11-03 (×5): qty 1

## 2016-11-03 MED ORDER — ONDANSETRON HCL 4 MG/2ML IJ SOLN: 4.0000 mg | Freq: Four times a day (QID) | INTRAMUSCULAR | Status: DC | PRN

## 2016-11-03 MED ORDER — ACETAMINOPHEN 650 MG RE SUPP: 650.0000 mg | Freq: Four times a day (QID) | RECTAL | Status: DC | PRN

## 2016-11-03 MED ORDER — FUROSEMIDE 10 MG/ML IJ SOLN
40.0000 mg | Freq: Three times a day (TID) | INTRAMUSCULAR | Status: DC
  Administered 2016-11-04: 40 mg via INTRAVENOUS
  Filled 2016-11-03: qty 4

## 2016-11-03 MED ORDER — PANTOPRAZOLE SODIUM 40 MG PO TBEC
40.0000 mg | DELAYED_RELEASE_TABLET | Freq: Two times a day (BID) | ORAL | Status: DC
  Administered 2016-11-03 – 2016-11-19 (×32): 40 mg via ORAL
  Filled 2016-11-03 (×32): qty 1

## 2016-11-03 MED ORDER — POTASSIUM CHLORIDE CRYS ER 20 MEQ PO TBCR
20.0000 meq | EXTENDED_RELEASE_TABLET | Freq: Two times a day (BID) | ORAL | Status: AC
  Administered 2016-11-04 (×2): 20 meq via ORAL
  Filled 2016-11-03 (×2): qty 1

## 2016-11-03 MED ORDER — DIPHENHYDRAMINE HCL 25 MG PO CAPS
25.0000 mg | ORAL_CAPSULE | Freq: Once | ORAL | Status: AC
  Administered 2016-11-03: 25 mg via ORAL
  Filled 2016-11-03: qty 1

## 2016-11-03 MED ORDER — FLECAINIDE ACETATE 100 MG PO TABS
100.0000 mg | ORAL_TABLET | Freq: Two times a day (BID) | ORAL | Status: DC
  Administered 2016-11-03 – 2016-11-10 (×14): 100 mg via ORAL
  Filled 2016-11-03 (×14): qty 1

## 2016-11-03 MED ORDER — FUROSEMIDE 10 MG/ML IJ SOLN
40.0000 mg | Freq: Once | INTRAMUSCULAR | Status: AC
  Administered 2016-11-03: 40 mg via INTRAVENOUS
  Filled 2016-11-03: qty 4

## 2016-11-03 MED ORDER — LORATADINE 10 MG PO TABS: 10.0000 mg | ORAL_TABLET | Freq: Every day | ORAL | Status: DC | PRN

## 2016-11-03 MED ORDER — ALBUTEROL SULFATE (2.5 MG/3ML) 0.083% IN NEBU
2.5000 mg | INHALATION_SOLUTION | Freq: Once | RESPIRATORY_TRACT | Status: AC
  Administered 2016-11-03: 2.5 mg via RESPIRATORY_TRACT
  Filled 2016-11-03: qty 3

## 2016-11-03 MED ORDER — OMEGA-3-ACID ETHYL ESTERS 1 G PO CAPS
1.0000 g | ORAL_CAPSULE | Freq: Every day | ORAL | Status: DC
  Administered 2016-11-04 – 2016-11-19 (×16): 1 g via ORAL
  Filled 2016-11-03 (×16): qty 1

## 2016-11-03 MED ORDER — ACETAMINOPHEN 325 MG PO TABS
650.0000 mg | ORAL_TABLET | Freq: Four times a day (QID) | ORAL | Status: DC | PRN
  Administered 2016-11-03 – 2016-11-04 (×2): 650 mg via ORAL
  Filled 2016-11-03 (×3): qty 2

## 2016-11-03 MED ORDER — APIXABAN 5 MG PO TABS
5.0000 mg | ORAL_TABLET | Freq: Two times a day (BID) | ORAL | Status: DC
  Administered 2016-11-03 – 2016-11-07 (×8): 5 mg via ORAL
  Filled 2016-11-03 (×8): qty 1

## 2016-11-03 MED ORDER — FUROSEMIDE 10 MG/ML IJ SOLN: 40.0000 mg | Freq: Three times a day (TID) | INTRAMUSCULAR | Status: DC

## 2016-11-03 MED ORDER — DIPHENHYDRAMINE HCL 50 MG/ML IJ SOLN
12.5000 mg | Freq: Once | INTRAMUSCULAR | Status: DC
  Filled 2016-11-03: qty 1

## 2016-11-03 MED ORDER — METHYLPREDNISOLONE SODIUM SUCC 125 MG IJ SOLR
125.0000 mg | Freq: Once | INTRAMUSCULAR | Status: AC
  Administered 2016-11-03: 125 mg via INTRAVENOUS
  Filled 2016-11-03: qty 2

## 2016-11-03 MED ORDER — IPRATROPIUM-ALBUTEROL 0.5-2.5 (3) MG/3ML IN SOLN
9.0000 mL | Freq: Once | RESPIRATORY_TRACT | Status: AC
  Administered 2016-11-03: 9 mL via RESPIRATORY_TRACT
  Filled 2016-11-03: qty 6

## 2016-11-03 MED ORDER — DOXYCYCLINE HYCLATE 100 MG PO TABS
100.0000 mg | ORAL_TABLET | Freq: Once | ORAL | Status: AC
  Administered 2016-11-03: 100 mg via ORAL
  Filled 2016-11-03: qty 1

## 2016-11-03 MED ORDER — LEVOTHYROXINE SODIUM 75 MCG PO TABS
75.0000 ug | ORAL_TABLET | Freq: Every day | ORAL | Status: DC
  Administered 2016-11-04 – 2016-11-19 (×16): 75 ug via ORAL
  Filled 2016-11-03 (×16): qty 1

## 2016-11-03 MED ORDER — CALCIUM CARBONATE ANTACID 500 MG PO CHEW
1.0000 | CHEWABLE_TABLET | Freq: Two times a day (BID) | ORAL | Status: DC
  Administered 2016-11-03 – 2016-11-19 (×30): 200 mg via ORAL
  Filled 2016-11-03 (×30): qty 1

## 2016-11-03 MED ORDER — SODIUM CHLORIDE 0.9% FLUSH
3.0000 mL | Freq: Two times a day (BID) | INTRAVENOUS | Status: DC
  Administered 2016-11-03 – 2016-11-19 (×29): 3 mL via INTRAVENOUS

## 2016-11-03 MED ORDER — IPRATROPIUM-ALBUTEROL 0.5-2.5 (3) MG/3ML IN SOLN
3.0000 mL | RESPIRATORY_TRACT | Status: DC
  Administered 2016-11-03: 3 mL via RESPIRATORY_TRACT
  Filled 2016-11-03: qty 3

## 2016-11-03 MED ORDER — METHYLPREDNISOLONE SODIUM SUCC 125 MG IJ SOLR
60.0000 mg | Freq: Three times a day (TID) | INTRAMUSCULAR | Status: DC
  Administered 2016-11-04 (×2): 60 mg via INTRAVENOUS
  Filled 2016-11-03 (×2): qty 2

## 2016-11-03 MED ORDER — IPRATROPIUM-ALBUTEROL 0.5-2.5 (3) MG/3ML IN SOLN
3.0000 mL | Freq: Four times a day (QID) | RESPIRATORY_TRACT | Status: DC
  Administered 2016-11-04 – 2016-11-19 (×62): 3 mL via RESPIRATORY_TRACT
  Filled 2016-11-03 (×63): qty 3

## 2016-11-03 MED ORDER — ONDANSETRON HCL 4 MG PO TABS: 4.0000 mg | ORAL_TABLET | Freq: Four times a day (QID) | ORAL | Status: DC | PRN

## 2016-11-03 NOTE — ED Provider Notes (Signed)
[facility] Emergency Department Provider Note  ____________________________________________   First MD Initiated Contact with Patient 11/03/16 1653     (approximate)  I have reviewed the triage vital signs and the nursing notes.   HISTORY  Chief Complaint Shortness of Breath   HPI Toni [name] is a 82 y.o. female with a history of COPD as well as CHF on as needed home oxygen who is presenting to the emergency department with chest pressure as well as worsening breathing since this morning. She says the pressure in her chest feels like it is across the front of her chest as a 6 out of 10 at this time. Says that she has felt similarly in the past with her COPD. She also reports a mild increase in swelling of her bilateral lower extremities since this morning. Denies any cough or fever. Denies any runny nose. Denies any nausea vomiting or diarrhea.   Past Medical History:  Diagnosis Date  . Atypical atrial flutter ([place]) 11/16/2013   a. CHA2DS2VASc = 5-->prev on xarelto, d/c'd due to bleeding skin tear;  c. 11/2013->Placed on flecainide.  . Cataract   . Chronic diastolic CHF (congestive heart failure) ([place])    a. 11/2013 Echo: EF 55-60%, mild AS/MR, mod dil LA/RA, PASP 36mHg.  .Marland KitchenChronic diastolic heart failure (HBrillion   . Collagen vascular disease (HKemp   . COPD (chronic obstructive pulmonary disease) (HAndrews   . Essential hypertension, benign   . Hypokalemia   . Hypothyroidism   . 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 Lung cancer (HH[place]   . Lung nodule    Followed by Dr. FRaul Del . PAF (paroxysmal atrial fibrillation) (HTira    a. 11/2013 Echo: EF 55-60%, mild AS/MR, mod dil LA/RA, PASP 354mg;  b. CHA2DS2VASc = 5-->prev on xarelto, d/c'd due to bleeding skin tear;  c. 11/2013->Placed on flecainide.    Patient Active Problem List   Diagnosis Date Noted  . Aortic valve stenosis, moderate 08/03/2016  . Adjustment disorder with anxiety  02/23/2016  . Dyspepsia   . Esophageal reflux   . Encounter for anticoagulation discussion and counseling 11/17/2015  . Fall 11/17/2015  . Essential hypertension, benign   . Lung infiltrate on CT 06/08/2015  . Chronic diastolic CHF (congestive heart failure) (HCLane08/29/2016  . Insomnia 04/17/2014  . Squamous cell lung cancer, right (HCGoulds07/06/2014  . Postnasal drip 03/05/2014  . Hyperlipidemia 12/04/2013  . Leg edema 12/04/2013  . Atypical atrial flutter (HCHigh [place]03/09/2013  . COPD (chronic obstructive pulmonary disease) (HCSte. Genevieve02/28/2015  . Chronic kidney disease 08/24/2013  . Essential (primary) hypertension 08/24/2013  . Adult hypothyroidism 08/24/2013  . Acute exacerbation of chronic obstructive airways disease (HCLa [place]12/03/2013  . Chronic obstructive pulmonary disease with acute exacerbation (HCNorphlet12/03/2013    Past Surgical History:  Procedure Laterality Date  . EYE SURGERY    . TOTAL KNEE ARTHROPLASTY Bilateral     Prior to Admission medications   Medication Sig Start Date End Date Taking? Authorizing Provider  albuterol (PROVENTIL HFA) 108 (90 Base) MCG/ACT inhaler Inhale 2 puffs into the lungs every 4 (four) hours as needed for wheezing or shortness of breath. 07/23/16  Yes PhCarrie MewMD  apixaban (ELIQUIS) 5 MG TABS tablet Take 1 tablet (5 mg total) by mouth 2 (two) times daily. 08/03/16  Yes TiMinna MerrittsMD  budesonide (PULMICORT) 0.5 MG/2ML nebulizer solution Take 2 mLs (0.5 mg total) by nebulization 2 (two) times  daily. 06/08/15  Yes Flora Lipps, MD  cyanocobalamin (,VITAMIN B-12,) 1000 MCG/ML injection Vit B12 1000 mcg IM once a week for 4 weeks then once a month for 4 months 10/09/16  Yes Historical Provider, MD  diltiazem (CARDIZEM CD) 300 MG 24 hr capsule Take 300 mg by mouth daily.   Yes Historical Provider, MD  flecainide (TAMBOCOR) 100 MG tablet Take 1 tablet (100 mg total) by mouth 2 (two) times daily. 11/24/13  Yes Frazier Richards, MD  formoterol  (PERFOROMIST) 20 MCG/2ML nebulizer solution Take 2 mLs (20 mcg total) by nebulization 2 (two) times daily. 06/08/15  Yes Flora Lipps, MD  furosemide (LASIX) 80 MG tablet Take 1 tablet (80 mg total) by mouth 2 (two) times daily. Patient taking differently: Take 80 mg by mouth daily. Take 160 mg on Monday, Wednesday and Friday. Take 80 mg on Tuesday, Thursday, Saturday and Sunday. 08/03/16  Yes Minna Merritts, MD  ipratropium-albuterol (DUONEB) 0.5-2.5 (3) MG/3ML SOLN Take 3 mLs by nebulization every 4 (four) hours as needed (for wheezing/shortness of breath). DX Code: J44.9   DX: COPD 08/29/16  Yes Wilhelmina Mcardle, MD  levothyroxine (SYNTHROID, LEVOTHROID) 75 MCG tablet Take 75 mcg by mouth daily before breakfast.   Yes Historical Provider, MD  loratadine (CLARITIN) 10 MG tablet Take 10 mg by mouth daily.   Yes Historical Provider, MD  omega-3 acid ethyl esters (LOVAZA) 1 g capsule Take 1 g by mouth daily.   Yes Historical Provider, MD  omeprazole (PRILOSEC) 20 MG capsule Take 20 mg by mouth 2 (two) times daily before a meal.   Yes Historical Provider, MD  potassium chloride (K-DUR,KLOR-CON) 10 MEQ tablet Take 2 tablets (20 mEq total) by mouth 2 (two) times daily. 08/03/16  Yes Minna Merritts, MD    Allergies Belladonna alkaloids and Dabigatran  Family History  Problem Relation Age of Onset  . CAD Father   . Heart disease Father   . Heart attack Father   . Arrhythmia Sister     Social History Social History  Substance Use Topics  . Smoking status: Former Smoker    Packs/day: 1.50    Years: 50.00    Types: Cigarettes    Quit date: 09/18/2002  . Smokeless tobacco: Never Used  . Alcohol use No    Review of Systems Constitutional: No fever/chills Eyes: No visual changes. ENT: No sore throat. Cardiovascular: as above Respiratory:as above Gastrointestinal: No abdominal pain.  No nausea, no vomiting.   Genitourinary: Negative for dysuria. Musculoskeletal: Negative for back  pain. Skin: Negative for rash. Neurological: Negative for headaches, focal weakness or numbness.  10-point ROS otherwise negative.  ____________________________________________   PHYSICAL EXAM:  VITAL SIGNS: ED Triage Vitals [11/03/16 1614]  Enc Vitals Group     BP (!) 130/59     Pulse Rate 65     Resp (!) 32     Temp 98 F (36.7 C)     Temp Source Oral     SpO2 99 %     Weight 180 lb (81.6 kg)     Height 5' 6"  (1.676 m)     Head Circumference      Peak Flow      Pain Score      Pain Loc      Pain Edu?      Excl. in [place]?     Constitutional: Alert and oriented. Well appearing and in no acute distress. Eyes: Conjunctivae are normal. PERRL. EOMI. Head: Atraumatic.  Nose: No congestion/rhinnorhea. Mouth/Throat: Mucous membranes are moist.  Neck: No stridor.   Cardiovascular: Normal rate, regular rhythm. Grossly normal heart sounds.   Respiratory: Normal respiratory effort. Prolonged expiratory phase with wheezing throughout. Gastrointestinal: Soft and nontender. No distention.  Musculoskeletal: No lower extremity tenderness nor edema.  No joint effusions. Neurologic:  Normal speech and language. No gross focal neurologic deficits are appreciated.  Skin:  Skin is warm, dry and intact. No rash noted. Psychiatric: Mood and affect are normal. Speech and behavior are normal.  ____________________________________________   LABS (all labs ordered are listed, but only abnormal results are displayed)  Labs Reviewed  CBC - Abnormal; Notable for the following:       Result Value   RBC 3.21 (*)    Hemoglobin 9.4 (*)    HCT 27.9 (*)    RDW 15.2 (*)    All other components within normal limits  COMPREHENSIVE METABOLIC PANEL - Abnormal; Notable for the following:    Chloride 94 (*)    CO2 35 (*)    Glucose, Bld 109 (*)    BUN 33 (*)    Creatinine, Ser 1.50 (*)    Calcium 8.8 (*)    ALT 11 (*)    GFR calc non Af Amer 32 (*)    GFR calc Af Amer 37 (*)    All other  components within normal limits  TROPONIN I - Abnormal; Notable for the following:    Troponin I 0.03 (*)    All other components within normal limits  BRAIN NATRIURETIC PEPTIDE - Abnormal; Notable for the following:    B Natriuretic Peptide 338.0 (*)    All other components within normal limits   ____________________________________________  EKG  ED ECG REPORT I, Doran Stabler, the attending physician, personally viewed and interpreted this ECG.   Date: 11/03/2016  EKG Time: 1610  Rate: 68  Rhythm: atrial fibrillation, rate 68  Axis: normal  Intervals:Prolonged QT at 4-93 ms  ST&T Change: No ST segment elevation or depression. T-wave inversions in aVL. No significant change from EKG of 09/15/2016. ____________________________________________  RADIOLOGY  DG Chest 2 View (Final result)  Result time 11/03/16 17:09:16  Final result by Santa Lighter, MD (11/03/16 17:09:16)           Narrative:   CLINICAL DATA: Acute onset of shortness of breath. Initial encounter.  EXAM: CHEST 2 VIEW  COMPARISON: Chest radiograph and CTA of the chest performed 07/30/2016  FINDINGS: The lungs are well-aerated. Vascular congestion is noted. Bilateral central airspace opacification may reflect pulmonary edema or pneumonia. There is no evidence of pleural effusion or pneumothorax.  The heart is borderline normal in size. No acute osseous abnormalities are seen. A moderate hiatal hernia is noted.  IMPRESSION: 1. Vascular congestion noted. Bilateral central airspace opacification may reflect pulmonary edema or pneumonia. 2. Moderate hiatal hernia seen.   Electronically Signed By: Garald Balding M.D. On: 11/03/2016 17:09            ____________________________________________   PROCEDURES  Procedure(s) performed:   Procedures  Critical Care performed:   ____________________________________________   INITIAL IMPRESSION / ASSESSMENT AND PLAN / ED  COURSE  Pertinent labs & imaging results that were available during my care of the patient were reviewed by me and considered in my medical decision making (see chart for details).  ----------------------------------------- 6:42 PM on 11/03/2016 -----------------------------------------  Patient still with wheezing throughout. Also her nurse had her stand to walk to the bathroom on room  air and the patient desaturated to 78%. She will be admitted to the hospital. Discussed with the patient as well as her husband was at bedside. Signed out to Dr. Tressia Miners for admission to the hospital.      ____________________________________________   FINAL CLINICAL IMPRESSION(S) / ED DIAGNOSES  CHF. COPD. Hypoxia. Community acquired pna.     NEW MEDICATIONS STARTED DURING THIS VISIT:  New Prescriptions   No medications on file     Note:  This document was prepared using Dragon voice recognition software and may include unintentional dictation errors.    Orbie Pyo, MD 11/03/16 [phone]

## 2016-11-03 NOTE — Telephone Encounter (Signed)
Pt spouse is calling asking how often is patient allowed to use her inhaler  Please advise.

## 2016-11-03 NOTE — Telephone Encounter (Signed)
If she is requiring Duoneb every 4 hrs, I believe that she needs evaluation by MD - either urgent care center or emergency department. If she will not do that, we should try to get her in to office on Monday  Waunita Schooner

## 2016-11-03 NOTE — ED Notes (Signed)
Meal tray provided. Pt updated on delay in placement in admission bed.

## 2016-11-03 NOTE — Telephone Encounter (Signed)
Asking how often she can use her Duoneb. Informed husband of instructions. Husband states she very SOB more than usual. Spoke with pt and she states no cough and has chest tightness. Pt is using her Pulmicort BID and Perforomist BID and using Duoneb q4hrs. Please advise.

## 2016-11-03 NOTE — Progress Notes (Signed)
Patient is requesting for something for sleep but doesn't take any sleeping aid at home. Dr. Marcille Blanco notified and received new order for Benadryl 25 mg  oral x 1 dose. Will continue to monitor.

## 2016-11-03 NOTE — Telephone Encounter (Signed)
Spoke with pt and husband and gave response. They states they will come to [facility] ER. Nothing further needed.

## 2016-11-03 NOTE — ED Notes (Signed)
Pt sipping on po fluids in no acute distress. Pt requesting pain medication for "my back, I can't sit here much longer". Pt repositioned in bed with some improvement in comfort.

## 2016-11-03 NOTE — ED Triage Notes (Signed)
Pt with shortness of breath started today and some pressure on her chest.

## 2016-11-03 NOTE — H&P (Signed)
[place] [facility] [place] NAME: Toni [name]    MR#:  [id number]  DATE OF BIRTH:  May 29, 1935  DATE OF ADMISSION:  11/03/2016  PRIMARY CARE PHYSICIAN: Dr. Ida Rogue  REQUESTING/REFERRING PHYSICIAN: Dr. Larae Grooms  CHIEF COMPLAINT:   Chief Complaint  Patient presents with  . Shortness of Breath    HISTORY OF PRESENT ILLNESS:  Toni [name]  is a 82 y.o. female with a known history of Paroxysmal atrial flutter on eliquis, chronic diastolic CHF, COPD on nocturnal home oxygen, hypertension, hypothyroidism, CKD stage II presents to hospital secondary to worsening shortness of breath. Patient states her symptoms started about 2-3 days ago. Denies any recent travel or sick contacts. Denies any fevers, chills, myalgias, nausea or vomiting. Her breathing got worse in the last couple of days. Called her pulmonologist's office today about how many times she can take the duo nebs. She was advised to come to the emergency room if her symptoms were worse. She was very tight on exam here. Chest x-ray shows pulmonary edema and emphysema. Also has 2+ leg edema. Hasn't been weighing herself lately. The swelling of her feet has worsened in the last 2 days. Denies any increased intake of salt.  PAST MEDICAL HISTORY:   Past Medical History:  Diagnosis Date  . Atypical atrial flutter ([place]) 11/16/2013   a. CHA2DS2VASc = 5-->prev on xarelto, d/c'd due to bleeding skin tear;  c. 11/2013->Placed on flecainide.  . Cataract   . Chronic diastolic CHF (congestive heart failure) ([place])    a. 11/2013 Echo: EF 55-60%, mild AS/MR, mod dil LA/RA, PASP 73mHg.  .Marland KitchenChronic diastolic heart failure (HPeeples Valley   . Collagen vascular disease (HBall[place]   . COPD (chronic obstructive pulmonary disease) (HEugene   . Essential hypertension, benign   . Hypokalemia   . Hypothyroidism   . 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 Lung cancer (HLakewood [place]   . Lung nodule    Followed  by Dr. FRaul Del . PAF (paroxysmal atrial fibrillation) (HRoche[place]    a. 11/2013 Echo: EF 55-60%, mild AS/MR, mod dil LA/RA, PASP 315mg;  b. CHA2DS2VASc = 5-->prev on xarelto, d/c'd due to bleeding skin tear;  c. 11/2013->Placed on flecainide.    PAST SURGICAL HISTORY:   Past Surgical History:  Procedure Laterality Date  . APPENDECTOMY    . EYE SURGERY    . TOTAL KNEE ARTHROPLASTY Bilateral     SOCIAL HISTORY:   Social History  Substance Use Topics  . Smoking status: Former Smoker    Packs/day: 1.50    Years: 50.00    Types: Cigarettes    Quit date: 09/18/2002  . Smokeless tobacco: Never Used  . Alcohol use No    FAMILY HISTORY:   Family History  Problem Relation Age of Onset  . CAD Father   . Heart disease Father   . Heart attack Father   . Arrhythmia Sister     DRUG ALLERGIES:   Allergies  Allergen Reactions  . Belladonna Alkaloids Anaphylaxis and Other (See Comments)    Pt states that this medication makes her eyes cross.    . Dabigatran Other (See Comments)    REVIEW OF SYSTEMS:   Review of Systems  Constitutional: Negative for chills, fever and weight loss.  HENT: Negative for ear discharge, ear pain, hearing loss and nosebleeds.   Eyes: Negative for blurred vision, double vision and photophobia.  Respiratory: Positive for wheezing. Negative for  cough, hemoptysis and shortness of breath.   Cardiovascular: Positive for leg swelling. Negative for chest pain, palpitations and orthopnea.  Gastrointestinal: Negative for abdominal pain, constipation, diarrhea, melena, nausea and vomiting.  Genitourinary: Negative for dysuria and urgency.  Musculoskeletal: Negative for back pain, myalgias and neck pain.  Skin: Negative for rash.  Neurological: Negative for dizziness, tingling, sensory change, speech change and focal weakness.  Endo/Heme/Allergies: Does not bruise/bleed easily.  Psychiatric/Behavioral: Negative for depression.    MEDICATIONS AT HOME:   Prior to  Admission medications   Medication Sig Start Date End Date Taking? Authorizing Provider  albuterol (PROVENTIL HFA) 108 (90 Base) MCG/ACT inhaler Inhale 2 puffs into the lungs every 4 (four) hours as needed for wheezing or shortness of breath. 07/23/16  Yes Carrie Mew, MD  apixaban (ELIQUIS) 5 MG TABS tablet Take 1 tablet (5 mg total) by mouth 2 (two) times daily. 08/03/16  Yes Minna Merritts, MD  budesonide (PULMICORT) 0.5 MG/2ML nebulizer solution Take 2 mLs (0.5 mg total) by nebulization 2 (two) times daily. 06/08/15  Yes Flora Lipps, MD  cyanocobalamin (,VITAMIN B-12,) 1000 MCG/ML injection Vit B12 1000 mcg IM once a week for 4 weeks then once a month for 4 months 10/09/16  Yes Historical Provider, MD  diltiazem (CARDIZEM CD) 300 MG 24 hr capsule Take 300 mg by mouth daily.   Yes Historical Provider, MD  flecainide (TAMBOCOR) 100 MG tablet Take 1 tablet (100 mg total) by mouth 2 (two) times daily. 11/24/13  Yes Frazier Richards, MD  formoterol (PERFOROMIST) 20 MCG/2ML nebulizer solution Take 2 mLs (20 mcg total) by nebulization 2 (two) times daily. 06/08/15  Yes Flora Lipps, MD  furosemide (LASIX) 80 MG tablet Take 1 tablet (80 mg total) by mouth 2 (two) times daily. Patient taking differently: Take 80 mg by mouth daily. Take 160 mg on Monday, Wednesday and Friday. Take 80 mg on Tuesday, Thursday, Saturday and Sunday. 08/03/16  Yes Minna Merritts, MD  ipratropium-albuterol (DUONEB) 0.5-2.5 (3) MG/3ML SOLN Take 3 mLs by nebulization every 4 (four) hours as needed (for wheezing/shortness of breath). DX Code: J44.9   DX: COPD 08/29/16  Yes Wilhelmina Mcardle, MD  levothyroxine (SYNTHROID, LEVOTHROID) 75 MCG tablet Take 75 mcg by mouth daily before breakfast.   Yes Historical Provider, MD  loratadine (CLARITIN) 10 MG tablet Take 10 mg by mouth daily.   Yes Historical Provider, MD  omega-3 acid ethyl esters (LOVAZA) 1 g capsule Take 1 g by mouth daily.   Yes Historical Provider, MD  omeprazole (PRILOSEC)  20 MG capsule Take 20 mg by mouth 2 (two) times daily before a meal.   Yes Historical Provider, MD  potassium chloride (K-DUR,KLOR-CON) 10 MEQ tablet Take 2 tablets (20 mEq total) by mouth 2 (two) times daily. 08/03/16  Yes Minna Merritts, MD      VITAL SIGNS:  Blood pressure (!) 121/52, pulse 61, temperature 98 F (36.7 C), temperature source Oral, resp. rate 19, height 5' 6"  (1.676 m), weight 81.6 kg (180 lb), SpO2 98 %.  PHYSICAL EXAMINATION:   Physical Exam  GENERAL:  82 y.o.-year-old patient lying in the bed with no acute distress.  EYES: Pupils equal, round, reactive to light and accommodation. No scleral icterus. Extraocular muscles intact.  HEENT: Head atraumatic, normocephalic. Oropharynx and nasopharynx clear.  NECK:  Supple, no jugular venous distention. No thyroid enlargement, no tenderness.  LUNGS: Diffuse scattered wheezing all over the lung fields with coarse bibasilar breath sounds, no rales,rhonchi  or crepitation. No use of accessory muscles of respiration.  CARDIOVASCULAR: S1, S2 normal. No  rubs, or gallops. 3/6 systolic murmur is present ABDOMEN: Soft, nontender, nondistended. Bowel sounds present. No organomegaly or mass.  EXTREMITIES: No cyanosis, or clubbing. 2+ bilateral pedal edema noted NEUROLOGIC: Cranial nerves II through XII are intact. Muscle strength 5/5 in all extremities. Sensation intact. Gait not checked. Global weakness noted PSYCHIATRIC: The patient is alert and oriented x 3.  SKIN: No obvious rash, lesion, or ulcer.   LABORATORY PANEL:   CBC  Recent Labs Lab 11/03/16 1625  WBC 7.8  HGB 9.4*  HCT 27.9*  PLT 243   ------------------------------------------------------------------------------------------------------------------  Chemistries   Recent Labs Lab 11/03/16 1625  NA 139  K 3.8  CL 94*  CO2 35*  GLUCOSE 109*  BUN 33*  CREATININE 1.50*  CALCIUM 8.8*  AST 15  ALT 11*  ALKPHOS 85  BILITOT 0.9    ------------------------------------------------------------------------------------------------------------------  Cardiac Enzymes  Recent Labs Lab 11/03/16 1625  TROPONINI 0.03*   ------------------------------------------------------------------------------------------------------------------  RADIOLOGY:  Dg Chest 2 View  Result Date: 11/03/2016 CLINICAL DATA:  Acute onset of shortness of breath. Initial encounter. EXAM: CHEST  2 VIEW COMPARISON:  Chest radiograph and CTA of the chest performed 07/30/2016 FINDINGS: The lungs are well-aerated. Vascular congestion is noted. Bilateral central airspace opacification may reflect pulmonary edema or pneumonia. There is no evidence of pleural effusion or pneumothorax. The heart is borderline normal in size. No acute osseous abnormalities are seen. A moderate hiatal hernia is noted. IMPRESSION: 1. Vascular congestion noted. Bilateral central airspace opacification may reflect pulmonary edema or pneumonia. 2. Moderate hiatal hernia seen. Electronically Signed   By: Garald Balding M.D.   On: 11/03/2016 17:09    EKG:   Orders placed or performed during the hospital encounter of 11/03/16  . EKG 12-Lead  . EKG 12-Lead  . ED EKG  . ED EKG    IMPRESSION AND PLAN:   Toni [name]  is a 81 y.o. female with a known history of Paroxysmal atrial flutter on eliquis, chronic diastolic CHF, COPD on nocturnal home oxygen, hypertension, hypothyroidism, CKD stage II presents to hospital secondary to worsening shortness of breath.  #1 acute on chronic diastolic CHF exacerbation-admit to telemetry, recycle troponins. -Echocardiogram from last June showing EF of 16-10% and diastolic dysfunction. -Change Lasix to IV every 8 hours. Daily weights and strict intake and output monitoring. -Cardiology consulted.  #2 acute on chronic COPD exacerbation-stopped smoking more than 10 years ago. Started on IV steroids and DuoNeb's. -Continue inhalers as needed. -No  evidence of any bronchitis. We'll hold off on antibiotics at this time.  #3 paroxysmal atrial flutter-on Flecanide. Cardiology monitoring. In normal sinus rhythm -On eliquis for anticoagulation. Rate is controlled at this time.  #4 hypothyroidism-on Synthroid  #5 DVT prophylaxis-already on eliquis  Physical therapy consulted. Care management consult for home health needs    All the records are reviewed and case discussed with ED provider. Management plans discussed with the patient, family and they are in agreement.  CODE STATUS: Full Code  TOTAL TIME TAKING CARE OF THIS PATIENT: 50 minutes.    Gladstone Lighter M.D on 11/03/2016 at 7:04 PM  Between 7am to 6pm - Pager - [phone]  After 6pm go to [email or web address] - password [email or web address] [place] [facility]  Office  [phone]  CC: Primary care physician; Pcp Not In System

## 2016-11-04 DIAGNOSIS — I350 Nonrheumatic aortic (valve) stenosis: Secondary | ICD-10-CM

## 2016-11-04 DIAGNOSIS — I480 Paroxysmal atrial fibrillation: Secondary | ICD-10-CM

## 2016-11-04 DIAGNOSIS — I5033 Acute on chronic diastolic (congestive) heart failure: Secondary | ICD-10-CM

## 2016-11-04 DIAGNOSIS — I10 Essential (primary) hypertension: Secondary | ICD-10-CM

## 2016-11-04 LAB — BASIC METABOLIC PANEL
ANION GAP: 11 (ref 5–15)
BUN: 37 mg/dL — ABNORMAL HIGH (ref 6–20)
CALCIUM: 9 mg/dL (ref 8.9–10.3)
CO2: 31 mmol/L (ref 22–32)
Chloride: 95 mmol/L — ABNORMAL LOW (ref 101–111)
Creatinine, Ser: 1.63 mg/dL — ABNORMAL HIGH (ref 0.44–1.00)
GFR, EST AFRICAN AMERICAN: 33 mL/min — AB (ref >60)
GFR, EST NON AFRICAN AMERICAN: 29 mL/min — AB (ref >60)
GLUCOSE: 140 mg/dL — AB (ref 65–99)
POTASSIUM: 4.1 mmol/L (ref 3.5–5.1)
Sodium: 137 mmol/L (ref 135–145)

## 2016-11-04 LAB — CBC
HEMATOCRIT: 28.4 % — AB (ref 35.0–47.0)
HEMOGLOBIN: 9.5 g/dL — AB (ref 12.0–16.0)
MCH: 29.3 pg (ref 26.0–34.0)
MCHC: 33.5 g/dL (ref 32.0–36.0)
MCV: 87.5 fL (ref 80.0–100.0)
Platelets: 249 10*3/uL (ref 150–440)
RBC: 3.24 MIL/uL — AB (ref 3.80–5.20)
RDW: 15.1 % — ABNORMAL HIGH (ref 11.5–14.5)
WBC: 4.9 10*3/uL (ref 3.6–11.0)

## 2016-11-04 LAB — TROPONIN I: TROPONIN I: 0.03 ng/mL — AB (ref <0.03)

## 2016-11-04 MED ORDER — TRAZODONE HCL 50 MG PO TABS
50.0000 mg | ORAL_TABLET | Freq: Every day | ORAL | Status: DC
  Administered 2016-11-04 – 2016-11-18 (×15): 50 mg via ORAL
  Filled 2016-11-04 (×15): qty 1

## 2016-11-04 MED ORDER — ALUM & MAG HYDROXIDE-SIMETH 200-200-20 MG/5ML PO SUSP
30.0000 mL | ORAL | Status: DC | PRN
  Administered 2016-11-04: 30 mL via ORAL
  Filled 2016-11-04: qty 30

## 2016-11-04 MED ORDER — OXYCODONE-ACETAMINOPHEN 5-325 MG PO TABS
1.0000 | ORAL_TABLET | Freq: Four times a day (QID) | ORAL | Status: DC | PRN
  Administered 2016-11-04 – 2016-11-18 (×19): 1 via ORAL
  Filled 2016-11-04 (×19): qty 1

## 2016-11-04 MED ORDER — METHYLPREDNISOLONE SODIUM SUCC 40 MG IJ SOLR
40.0000 mg | Freq: Three times a day (TID) | INTRAMUSCULAR | Status: DC
  Administered 2016-11-04 – 2016-11-05 (×2): 40 mg via INTRAVENOUS
  Filled 2016-11-04 (×2): qty 1

## 2016-11-04 MED ORDER — FUROSEMIDE 10 MG/ML IJ SOLN
80.0000 mg | Freq: Two times a day (BID) | INTRAMUSCULAR | Status: DC
  Administered 2016-11-04: 80 mg via INTRAVENOUS
  Filled 2016-11-04: qty 8

## 2016-11-04 MED ORDER — SIMETHICONE 80 MG PO CHEW
80.0000 mg | CHEWABLE_TABLET | Freq: Four times a day (QID) | ORAL | Status: DC | PRN
  Administered 2016-11-04 – 2016-11-05 (×3): 80 mg via ORAL
  Filled 2016-11-04 (×3): qty 1

## 2016-11-04 MED ORDER — ALPRAZOLAM 0.25 MG PO TABS
0.2500 mg | ORAL_TABLET | Freq: Three times a day (TID) | ORAL | Status: DC | PRN
  Administered 2016-11-04 – 2016-11-10 (×7): 0.25 mg via ORAL
  Filled 2016-11-04 (×7): qty 1

## 2016-11-04 NOTE — Progress Notes (Signed)
Patient  is admitted to room 235 with the diagnosis of CHF exacerbation.  Alert and oriented x 4. Denied any acute pain. Tele box called to CCMD with Estill Bamberg as a second Psychologist, counselling. Skin assessment done with Ninfa Meeker. RN, noted scattered bruises on upper and lower extremities and abrasiion on top of  right knee and lateral part of the right knee. Bed alarm activated and the bed is in the lowest position. Will continue to monitor.

## 2016-11-04 NOTE — Progress Notes (Signed)
Patient is requesting for sleeping medication. Stated the Benadryl she received last night did not work.  Dr. Ara Kussmaul notified with a new order for Trazodone 50 mg oral at bedtime. Will continue to monitor.

## 2016-11-04 NOTE — Progress Notes (Signed)
Pt. is complaining of generalized pain of 9/10 on the pain scale. Dr. Marcille Blanco notified with a new order for Percocet 5/325 mg oral every 6 hours as needed for pain. Will continue to monitor.

## 2016-11-04 NOTE — Progress Notes (Signed)
Pt request something for gas. MD notified. Order received. I will continue to assess.

## 2016-11-04 NOTE — Progress Notes (Signed)
Pt request medication for anxiety. MD notified. Orders received. I will continue to assess.

## 2016-11-04 NOTE — Progress Notes (Signed)
[facility] [place] NAME: Toni [name]    MR#:  [id number]  DATE OF BIRTH:  Dec 14, 1934  SUBJECTIVE:  CHIEF COMPLAINT:  Shortness of breath is better. Family bedside  REVIEW OF SYSTEMS:  CONSTITUTIONAL: No fever, fatigue or weakness.  EYES: No blurred or double vision.  EARS, NOSE, AND THROAT: No tinnitus or ear pain.  RESPIRATORY: No cough,Improving shortness of breath, wheezing or hemoptysis.  CARDIOVASCULAR: No chest pain, orthopnea, edema.  GASTROINTESTINAL: No nausea, vomiting, diarrhea or abdominal pain.  GENITOURINARY: No dysuria, hematuria.  ENDOCRINE: No polyuria, nocturia,  HEMATOLOGY: No anemia, easy bruising or bleeding SKIN: No rash or lesion. MUSCULOSKELETAL: No joint pain or arthritis.   NEUROLOGIC: No tingling, numbness, weakness.  PSYCHIATRY: No anxiety or depression.   DRUG ALLERGIES:   Allergies  Allergen Reactions  . Belladonna Alkaloids Anaphylaxis and Other (See Comments)    Pt states that this medication makes her eyes cross.    . Dabigatran Other (See Comments)    VITALS:  Blood pressure (!) 140/47, pulse 66, temperature 98 F (36.7 C), temperature source Oral, resp. rate 18, height '5\' 6"'$  (1.676 m), weight 86.3 kg (190 lb 3.2 oz), SpO2 94 %.  PHYSICAL EXAMINATION:  GENERAL:  82 y.o.-year-old patient lying in the bed with no acute distress.  EYES: Pupils equal, round, reactive to light and accommodation. No scleral icterus. Extraocular muscles intact.  HEENT: Head atraumatic, normocephalic. Oropharynx and nasopharynx clear.  NECK:  Supple, no jugular venous distention. No thyroid enlargement, no tenderness.  LUNGS: Mod breath sounds bilaterally, no wheezing, rales,rhonchi or crepitation. No use of accessory muscles of respiration.  CARDIOVASCULAR: S1, S2 normal. No murmurs, rubs, or gallops.  ABDOMEN: Soft, nontender, nondistended. Bowel sounds present. No organomegaly or mass.  EXTREMITIES: No  pedal edema, cyanosis, or clubbing.  NEUROLOGIC: Cranial nerves II through XII are intact. Muscle strength 5/5 in all extremities. Sensation intact. Gait not checked.  PSYCHIATRIC: The patient is alert and oriented x 3.  SKIN: No obvious rash, lesion, or ulcer.    LABORATORY PANEL:   CBC  Recent Labs Lab 11/04/16 0842  WBC 4.9  HGB 9.5*  HCT 28.4*  PLT 249   ------------------------------------------------------------------------------------------------------------------  Chemistries   Recent Labs Lab 11/03/16 1625 11/04/16 0842  NA 139 137  K 3.8 4.1  CL 94* 95*  CO2 35* 31  GLUCOSE 109* 140*  BUN 33* 37*  CREATININE 1.50* 1.63*  CALCIUM 8.8* 9.0  AST 15  --   ALT 11*  --   ALKPHOS 85  --   BILITOT 0.9  --    ------------------------------------------------------------------------------------------------------------------  Cardiac Enzymes  Recent Labs Lab 11/04/16 0842  TROPONINI 0.03*   ------------------------------------------------------------------------------------------------------------------  RADIOLOGY:  Dg Chest 2 View  Result Date: 11/03/2016 CLINICAL DATA:  Acute onset of shortness of breath. Initial encounter. EXAM: CHEST  2 VIEW COMPARISON:  Chest radiograph and CTA of the chest performed 07/30/2016 FINDINGS: The lungs are well-aerated. Vascular congestion is noted. Bilateral central airspace opacification may reflect pulmonary edema or pneumonia. There is no evidence of pleural effusion or pneumothorax. The heart is borderline normal in size. No acute osseous abnormalities are seen. A moderate hiatal hernia is noted. IMPRESSION: 1. Vascular congestion noted. Bilateral central airspace opacification may reflect pulmonary edema or pneumonia. 2. Moderate hiatal hernia seen. Electronically Signed   By: Garald Balding M.D.   On: 11/03/2016 17:09    EKG:   Orders placed or performed during the hospital  encounter of 11/03/16  . EKG 12-Lead  . EKG  12-Lead    ASSESSMENT AND PLAN:   Toni [name]  is a 82 y.o. female with a known history of Paroxysmal atrial flutter on eliquis, chronic diastolic CHF, COPD on nocturnal home oxygen, hypertension, hypothyroidism, CKD stage II presents to hospital secondary to worsening shortness of breath.  #1 acute on chronic diastolic CHF exacerbation-admit to telemetry, recycle troponins. -Echocardiogram from last June showing EF of 01-09% and diastolic dysfunction. -Change Lasix to IV every 8 hours.  -Daily weights and strict intake and output monitoring. -f/u [facility] cardio   #2 acute on chronic COPD exacerbation-stopped smoking more than 10 years ago. Started on IV steroids and DuoNeb's. -Continue inhalers as needed. -No evidence of any bronchitis. We'll hold off on antibiotics at this time.  #3 paroxysmal atrial flutter-on Flecanide. Cardiology monitoring. In normal sinus rhythm -On eliquis for anticoagulation. Rate is controlled at this time.  # hypothyroidism-on Synthroid  # Mod Aortic stenosis - seen on recent echocardiogram from 2017 #5 DVT prophylaxis-already on eliquis  Physical therapy consulted. Care management consult for home health needs     All the records are reviewed and case discussed with Care Management/Social Workerr. Management plans discussed with the patient, family and they are in agreement.  CODE STATUS: fc  TOTAL TIME TAKING CARE OF THIS PATIENT: 35  minutes.   POSSIBLE D/C IN 1-2 DAYS, DEPENDING ON CLINICAL CONDITION.  Note: This dictation was prepared with Dragon dictation along with smaller phrase technology. Any transcriptional errors that result from this process are unintentional.   Nicholes Mango M.D on 11/04/2016 at 3:58 PM  Between 7am to 6pm - Pager - [phone] After 6pm go to [email or web address] - password [email or web address] [facility]  Office  [phone]  CC: Primary care physician; Pcp Not In System

## 2016-11-04 NOTE — Consult Note (Addendum)
CARDIOLOGY CONSULT NOTE     Primary Care Physician: Pcp Not In System Referring Physician: Tressia Miners  Admit Date: 11/03/2016  Reason for consultation: atrial fibrillation, CHF  Toni [name] is a 82 y.o. female with a h/o paroxysmal atrial flutter on eliquis, chronic diastolic CHF, COPD on nocturnal home oxygen, hypertension, hypothyroidism, CKD stage II presented to hospital secondary to worsening shortness of breath. Symptoms started to 2-3 days ago. For the last couple of days, she has been getting short of breath with minimal amounts of exertion. She has not had any chest pain. She has not been sleeping in the bed, but has been sleeping in a recliner. She has not been able to lie flat. On a daily basis, she does sleep with her head elevated and does take oxygen at night, but she has been worse over the last few days.  Past Medical History:  Diagnosis Date  . Atypical atrial flutter ([place]) 11/16/2013   a. CHA2DS2VASc = 5-->prev on xarelto, d/c'd due to bleeding skin tear;  c. 11/2013->Placed on flecainide.  . Cataract   . Chronic diastolic CHF (congestive heart failure) ([place])    a. 11/2013 Echo: EF 55-60%, mild AS/MR, mod dil LA/RA, PASP 23mHg.  .[place] Kitchen[place]ronic diastolic heart failure ([place]   . Collagen vascular disease ([facility]   . COPD (chronic obstructive pulmonary disease) ([facility]   . Essential hypertension, benign   . Hypokalemia   . Hypothyroidism   . 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 Lung cancer ([facility]   . Lung nodule    Followed by Dr. FRaul Del . PAF (paroxysmal atrial fibrillation) ([facility]    a. 11/2013 Echo: EF 55-60%, mild AS/MR, mod dil LA/RA, PASP 385mg;  b. CHA2DS2VASc = 5-->prev on xarelto, d/c'd due to bleeding skin tear;  c. 11/2013->Placed on flecainide.   Past Surgical History:  Procedure Laterality Date  . APPENDECTOMY    . EYE SURGERY    . TOTAL KNEE ARTHROPLASTY Bilateral     . apixaban  5 mg Oral BID  . calcium carbonate  1 tablet Oral BID    . diltiazem  300 mg Oral Daily  . flecainide  100 mg Oral BID  . furosemide  40 mg Intravenous Q8H  . ipratropium-albuterol  3 mL Nebulization Q6H  . levothyroxine  75 mcg Oral QAC breakfast  . methylPREDNISolone (SOLU-MEDROL) injection  60 mg Intravenous Q8H  . omega-3 acid ethyl esters  1 g Oral Daily  . pantoprazole  40 mg Oral BID  . potassium chloride  20 mEq Oral BID  . sodium chloride flush  3 mL Intravenous Q12H     Allergies  Allergen Reactions  . Belladonna Alkaloids Anaphylaxis and Other (See Comments)    Pt states that this medication makes her eyes cross.    . Dabigatran Other (See Comments)    Social History   Social History  . Marital status: Married    Spouse name: N/A  . Number of children: N/A  . Years of education: N/A   Occupational History  . Not on file.   Social History Main Topics  . Smoking status: Former Smoker    Packs/day: 1.50    Years: 50.00    Types: Cigarettes    Quit date: 09/18/2002  . Smokeless tobacco: Never Used  . Alcohol use No  . Drug use: No  . Sexual activity: Not on file   Other Topics Concern  . Not on file   Social  History Narrative   Lives at home with husband. Independent at baseline.    Family History  Problem Relation Age of Onset  . CAD Father   . Heart disease Father   . Heart attack Father   . Arrhythmia Sister     ROS- All systems are reviewed and negative except as per the HPI above  Physical Exam: Telemetry: Vitals:   11/03/16 2128 11/04/16 0334 11/04/16 0751 11/04/16 1121  BP:  (!) 119/53 (!) 131/56 (!) 140/47  Pulse:  60 64 66  Resp:  18 19 18   Temp:  98.2 F (36.8 C) 98 F (36.7 C) 98 F (36.7 C)  TempSrc:  Oral Oral Oral  SpO2: 95% 95% 97% 97%  Weight:  190 lb 3.2 oz (86.3 kg)    Height:        GEN- The patient is well appearing, alert and oriented x 3 today.   Head- normocephalic, atraumatic Eyes-  Sclera clear, conjunctiva pink Ears- hearing intact Oropharynx- clear Neck-  supple, no JVP Lymph- no cervical lymphadenopathy Lungs- Crackles bilaterally, normal work of breathing Heart- Regular rate and rhythm, no murmurs, rubs or gallops, PMI not laterally displaced GI- soft, NT, ND, + BS Extremities- no clubbing, cyanosis, or edema MS- no significant deformity or atrophy Skin- no rash or lesion Psych- euthymic mood, full affect Neuro- strength and sensation are intact  EKG: atrial fibrillation, prolonged QTc  Labs:   Lab Results  Component Value Date   WBC 4.9 11/04/2016   HGB 9.5 (L) 11/04/2016   HCT 28.4 (L) 11/04/2016   MCV 87.5 11/04/2016   PLT 249 11/04/2016    Recent Labs Lab 11/03/16 1625 11/04/16 0842  NA 139 137  K 3.8 4.1  CL 94* 95*  CO2 35* 31  BUN 33* 37*  CREATININE 1.50* 1.63*  CALCIUM 8.8* 9.0  PROT 6.9  --   BILITOT 0.9  --   ALKPHOS 85  --   ALT 11*  --   AST 15  --   GLUCOSE 109* 140*   Lab Results  Component Value Date   CKTOTAL 52 09/26/2013   CKMB 3.3 09/26/2013   TROPONINI 0.03 (HH) 11/04/2016    Lab Results  Component Value Date   CHOL 220 (H) 11/16/2013   Lab Results  Component Value Date   HDL 53 11/16/2013   Lab Results  Component Value Date   LDLCALC 155 (H) 11/16/2013   Lab Results  Component Value Date   TRIG 58 11/16/2013   Lab Results  Component Value Date   CHOLHDL 4.2 11/16/2013   No results found for: LDLDIRECT    Radiology: 1. Vascular congestion noted. Bilateral central airspace opacification may reflect pulmonary edema or pneumonia. 2. Moderate hiatal hernia seen.  Echo 02/2016: - Left ventricle: There was mild to moderate concentric   hypertrophy. Systolic function was normal. The estimated ejection   fraction was in the range of 60% to 65%. - Aortic valve: Valve mobility was restricted. There was moderate   stenosis. Valve area (VTI): 1.3 cm^2. Valve area (Vmax): 1.2   cm^2. Valve area (Vmean): 1.18 cm^2. There appears to be aortic   regurgitation, no PHT was  measured. - Mitral valve: Moderately calcified annulus. There was moderate to   severe regurgitation directed centrally. - Left atrium: The atrium was moderately to severely dilated. - Right atrium: The atrium was moderately dilated. - Tricuspid valve: There was moderate-severe regurgitation directed   centrally. - Pulmonic valve: Peak gradient (S):  14 mm Hg. - Pulmonary arteries: Systolic pressure was severely increased.  ASSESSMENT AND PLAN:   1. Acute on chronic diastolic heart failure: Currently receiving IV Lasix every 8 hours. Is -240 mL since admission. Would continue current diuresis regimen. Would also monitor daily weights. Creatinine is certainly elevated from her baseline. This Clemente Dewey potentially improve as she is further diuresed. She has not put out much urine since being in the hospital. [facility] plan to adjust her Lasix to 80 mg twice a day. Would continue to monitor her renal function and potassium as she is diuresed. I have adjusted her Lasix dose to get her last dose at 2 PM. Should she not diurese well for the day, she was potentially require an extra dose in the evening.  2. Paroxysmal atrial fibrillation: In atrial fibrillation on admission, but in sinus rhythm today. Would continue flecainide and Eliquis  3. Essential hypertension: Well-controlled today. No changes necessary.  4. Moderate aortic stenosis: Seen on echocardiogram from 2017. TTE currently not indicated.  This patients CHA2DS2-VASc Score and unadjusted Ischemic Stroke Rate (% per year) is equal to 4.8 % stroke rate/year from a score of 4  Above score calculated as 1 point each if present [CHF, HTN, DM, Vascular=MI/PAD/Aortic Plaque, Age if 65-74, or Female] Above score calculated as 2 points each if present [Age > 75, or Stroke/TIA/TE]      Betha Shadix Meredith Leeds, MD 11/04/2016  11:24 AM

## 2016-11-04 NOTE — Evaluation (Signed)
Physical Therapy Evaluation Patient Details Name: KOYA HUNGER MRN: [id number] DOB: 1935-06-21 Today's Date: 11/04/2016   History of Present Illness  presented to ER secondary to worsening SOB; admitted with acute/chronic CHF, COPD.  Currently on 2L supplemental O2 via [place]  Clinical Impression  Upon evaluation, patient alert and oriented; follows all commands and demonstrates good insight/safety awareness.  Bilat UE/LE strength and ROM grossly symmetrical and WFL for basic transfers and mobility; no acute changes appreciated.  Able to complete sit/stand, basic transfers and gait (50') with RW, cga.  Does prefer use of RW at this time for external support and energy conservation.  No buckling or LOB, but significant SOB with exertion.  BORG rating 9-10/10 after above distance; sats >94% on 2L supplemental O2. Would benefit from skilled PT to address above deficits and promote optimal return to PLOF; Recommend transition to [place] upon discharge from acute hospitalization.     Follow Up Recommendations Home health PT    Equipment Recommendations   (has RW in home environment)    Recommendations for Other Services       Precautions / Restrictions Precautions Precautions: Fall Restrictions Weight Bearing Restrictions: No      Mobility  Bed Mobility               General bed mobility comments: seated in recliner beginning/end of treatment sesssion  Transfers Overall transfer level: Needs assistance Equipment used: Rolling walker (2 wheeled) Transfers: Sit to/from Stand Sit to Stand: Min guard            Ambulation/Gait Ambulation/Gait assistance: Min guard Ambulation Distance (Feet): 50 Feet Assistive device: Rolling walker (2 wheeled)     Gait velocity interpretation: <1.8 ft/sec, indicative of risk for recurrent falls General Gait Details: reciprocal stepping pattern; slow and guarded performance, but no buckling or LOB.  Mod SOB with exertion BORG 9-10/10 after  noted distance; sats >94% on 2L supplemental O2.  Stairs            Wheelchair Mobility    Modified Rankin (Stroke Patients Only)       Balance Overall balance assessment: Needs assistance Sitting-balance support: No upper extremity supported;Feet supported Sitting balance-Leahy Scale: Good     Standing balance support: Bilateral upper extremity supported Standing balance-Leahy Scale: Fair                               Pertinent Vitals/Pain Pain Assessment: No/denies pain    Home Living Family/patient expects to be discharged to:: Private residence Living Arrangements: Spouse/significant other Available Help at Discharge: Family Type of Home: House Home Access: Stairs to enter Entrance Stairs-Rails: Right Entrance Stairs-Number of Steps: 2 Home Layout: One level;Laundry or work area in [place]: Environmental consultant - 2 wheels      Prior Function Level of Independence: Independent         Comments: Indep with ADLs, household and community mobility; 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  Denies fall history; home O2 at night.     Hand Dominance        Extremity/Trunk Assessment   Upper Extremity Assessment Upper Extremity Assessment: Overall WFL for tasks assessed (bilat shoulder elevation limited, all planes, due to chronic arthritic changes)    Lower Extremity Assessment Lower Extremity Assessment: Overall WFL for tasks assessed       Communication   Communication: No difficulties  Cognition Arousal/Alertness: Awake/alert Behavior During Therapy: WFL for tasks assessed/performed  Overall Cognitive Status: Within Functional Limits for tasks assessed                      General Comments      Exercises Other Exercises Other Exercises: Toilet transfer, ambulatory with RW, cga; sit/stand from standard height toilet with grab bar, cga/min assist (multiple reps required due to lower surface height); standing balance at  sink for hand hygiene, close sup   Assessment/Plan    PT Assessment Patient needs continued PT services  PT Problem List Decreased range of motion;Decreased activity tolerance;Decreased balance;Decreased mobility;Cardiopulmonary status limiting activity          PT Treatment Interventions DME instruction;Gait training;Stair training;Functional mobility training;Therapeutic activities;Therapeutic exercise;Balance training;Patient/family education    PT Goals (Current goals can be found in the Care Plan section)  Acute Rehab PT Goals Patient Stated Goal: to return home and have therapy there PT Goal Formulation: With patient Time For Goal Achievement: 11/18/16 Potential to Achieve Goals: Good    Frequency Min 2X/week   Barriers to discharge        Co-evaluation               End of Session Equipment Utilized During Treatment: Gait belt;Oxygen Activity Tolerance: Patient tolerated treatment well Patient left: in chair;with call bell/phone within reach;with chair alarm set           Time: 1351-1415 PT Time Calculation (min) (ACUTE ONLY): 24 min   Charges:   PT Evaluation $PT Eval Low Complexity: 1 Procedure PT Treatments $Therapeutic Activity: 8-22 mins   PT G Codes:        Tayshon Winker H. Owens Shark, PT, DPT, NCS 11/04/16, 2:48 PM [phone]

## 2016-11-05 LAB — BASIC METABOLIC PANEL
ANION GAP: 10 (ref 5–15)
BUN: 52 mg/dL — AB (ref 6–20)
CHLORIDE: 98 mmol/L — AB (ref 101–111)
CO2: 31 mmol/L (ref 22–32)
Calcium: 8.8 mg/dL — ABNORMAL LOW (ref 8.9–10.3)
Creatinine, Ser: 1.69 mg/dL — ABNORMAL HIGH (ref 0.44–1.00)
GFR calc Af Amer: 32 mL/min — ABNORMAL LOW (ref >60)
GFR calc non Af Amer: 28 mL/min — ABNORMAL LOW (ref >60)
GLUCOSE: 143 mg/dL — AB (ref 65–99)
POTASSIUM: 4.3 mmol/L (ref 3.5–5.1)
Sodium: 139 mmol/L (ref 135–145)

## 2016-11-05 LAB — CBC
HEMATOCRIT: 24.7 % — AB (ref 35.0–47.0)
HEMOGLOBIN: 8.4 g/dL — AB (ref 12.0–16.0)
MCH: 29.4 pg (ref 26.0–34.0)
MCHC: 34 g/dL (ref 32.0–36.0)
MCV: 86.6 fL (ref 80.0–100.0)
Platelets: 258 10*3/uL (ref 150–440)
RBC: 2.86 MIL/uL — ABNORMAL LOW (ref 3.80–5.20)
RDW: 14.8 % — AB (ref 11.5–14.5)
WBC: 7 10*3/uL (ref 3.6–11.0)

## 2016-11-05 MED ORDER — ROPINIROLE HCL 0.25 MG PO TABS
0.2500 mg | ORAL_TABLET | Freq: Every day | ORAL | Status: DC
  Administered 2016-11-05 – 2016-11-18 (×14): 0.25 mg via ORAL
  Filled 2016-11-05 (×15): qty 1

## 2016-11-05 MED ORDER — BUDESONIDE 0.5 MG/2ML IN SUSP
0.5000 mg | Freq: Two times a day (BID) | RESPIRATORY_TRACT | Status: DC
  Administered 2016-11-05 – 2016-11-19 (×28): 0.5 mg via RESPIRATORY_TRACT
  Filled 2016-11-05 (×28): qty 2

## 2016-11-05 MED ORDER — POLYETHYLENE GLYCOL 3350 17 G PO PACK
17.0000 g | PACK | Freq: Every day | ORAL | Status: DC
  Administered 2016-11-05 – 2016-11-19 (×13): 17 g via ORAL
  Filled 2016-11-05 (×15): qty 1

## 2016-11-05 MED ORDER — METHYLPREDNISOLONE SODIUM SUCC 40 MG IJ SOLR
40.0000 mg | Freq: Every day | INTRAMUSCULAR | Status: DC
  Administered 2016-11-06 – 2016-11-13 (×8): 40 mg via INTRAVENOUS
  Filled 2016-11-05 (×8): qty 1

## 2016-11-05 MED ORDER — FUROSEMIDE 10 MG/ML IJ SOLN
40.0000 mg | Freq: Two times a day (BID) | INTRAMUSCULAR | Status: DC
  Administered 2016-11-05 – 2016-11-06 (×3): 40 mg via INTRAVENOUS
  Filled 2016-11-05 (×3): qty 4

## 2016-11-05 MED ORDER — CYANOCOBALAMIN 1000 MCG/ML IJ SOLN
1000.0000 ug | Freq: Once | INTRAMUSCULAR | Status: AC
  Administered 2016-11-05: 1000 ug via INTRAMUSCULAR
  Filled 2016-11-05: qty 1

## 2016-11-05 MED ORDER — MAGNESIUM HYDROXIDE 400 MG/5ML PO SUSP: 30.0000 mL | Freq: Every evening | ORAL | Status: DC | PRN

## 2016-11-05 NOTE — Progress Notes (Signed)
Progress Note  Patient Name: Toni [name] Date of Encounter: 11/05/2016  Primary Cardiologist: Rockey Situ  Subjective   He is feeling much better today. Less short of breath. Was able to sleep in bed with her head mildly elevated last night, similar to how she sleeps in bed at home. He did wake up and had some shortness of breath this morning.  Inpatient Medications    Scheduled Meds: . apixaban  5 mg Oral BID  . calcium carbonate  1 tablet Oral BID  . diltiazem  300 mg Oral Daily  . flecainide  100 mg Oral BID  . furosemide  40 mg Intravenous BID  . ipratropium-albuterol  3 mL Nebulization Q6H  . levothyroxine  75 mcg Oral QAC breakfast  . methylPREDNISolone (SOLU-MEDROL) injection  40 mg Intravenous Q8H  . omega-3 acid ethyl esters  1 g Oral Daily  . pantoprazole  40 mg Oral BID  . polyethylene glycol  17 g Oral Daily  . sodium chloride flush  3 mL Intravenous Q12H  . traZODone  50 mg Oral QHS   Continuous Infusions:  PRN Meds: acetaminophen **OR** acetaminophen, ALPRAZolam, alum & mag hydroxide-simeth, loratadine, magnesium hydroxide, ondansetron **OR** ondansetron (ZOFRAN) IV, oxyCODONE-acetaminophen, simethicone   Vital Signs    Vitals:   11/04/16 2014 11/05/16 0122 11/05/16 0459 11/05/16 1117  BP:   (!) 100/47 (!) 126/55  Pulse:   (!) 59 (!) 55  Resp:   18 18  Temp:   98.3 F (36.8 C) 98.1 F (36.7 C)  TempSrc:    Oral  SpO2: 97% 97% 95% 100%  Weight:   192 lb 9.6 oz (87.4 kg)   Height:        Intake/Output Summary (Last 24 hours) at 11/05/16 1154 Last data filed at 11/05/16 1052  Gross per 24 hour  Intake              480 ml  Output             1050 ml  Net             -570 ml   Filed Weights   11/03/16 2048 11/04/16 0334 11/05/16 0459  Weight: 191 lb 3.2 oz (86.7 kg) 190 lb 3.2 oz (86.3 kg) 192 lb 9.6 oz (87.4 kg)    Telemetry    Sinus rhythm - Personally Reviewed  ECG    AF, rate 68, prolonged QTc - Personally Reviewed  Physical Exam    GEN: No acute distress.   Neck: No JVD Cardiac: RRR, no murmurs, rubs, or gallops.  Respiratory: Clear to auscultation bilaterally. GI: Soft, nontender, non-distended  MS: 1-2+ edema; No deformity. Neuro:  Nonfocal  Psych: Normal affect   Labs    Chemistry Recent Labs Lab 11/03/16 1625 11/04/16 0842 11/05/16 0414  NA 139 137 139  K 3.8 4.1 4.3  CL 94* 95* 98*  CO2 35* 31 31  GLUCOSE 109* 140* 143*  BUN 33* 37* 52*  CREATININE 1.50* 1.63* 1.69*  CALCIUM 8.8* 9.0 8.8*  PROT 6.9  --   --   ALBUMIN 3.8  --   --   AST 15  --   --   ALT 11*  --   --   ALKPHOS 85  --   --   BILITOT 0.9  --   --   GFRNONAA 32* 29* 28*  GFRAA 37* 33* 32*  ANIONGAP '10 11 10     '$ Hematology Recent Labs Lab 11/03/16 1625  11/04/16 0842 11/05/16 0414  WBC 7.8 4.9 7.0  RBC 3.21* 3.24* 2.86*  HGB 9.4* 9.5* 8.4*  HCT 27.9* 28.4* 24.7*  MCV 87.1 87.5 86.6  MCH 29.3 29.3 29.4  MCHC 33.6 33.5 34.0  RDW 15.2* 15.1* 14.8*  PLT 243 249 258    Cardiac Enzymes Recent Labs Lab 11/03/16 1625 11/03/16 2056 11/04/16 0217 11/04/16 0842  TROPONINI 0.03* <0.03 <0.03 0.03*   No results for input(s): TROPIPOC in the last 168 hours.   BNP Recent Labs Lab 11/03/16 1625  BNP 338.0*     DDimer No results for input(s): DDIMER in the last 168 hours.   Radiology    Dg Chest 2 View  Result Date: 11/03/2016 CLINICAL DATA:  Acute onset of shortness of breath. Initial encounter. EXAM: CHEST  2 VIEW COMPARISON:  Chest radiograph and CTA of the chest performed 07/30/2016 FINDINGS: The lungs are well-aerated. Vascular congestion is noted. Bilateral central airspace opacification may reflect pulmonary edema or pneumonia. There is no evidence of pleural effusion or pneumothorax. The heart is borderline normal in size. No acute osseous abnormalities are seen. A moderate hiatal hernia is noted. IMPRESSION: 1. Vascular congestion noted. Bilateral central airspace opacification may reflect pulmonary edema or  pneumonia. 2. Moderate hiatal hernia seen. Electronically Signed   By: Garald Balding M.D.   On: 11/03/2016 17:09    Cardiac Studies   - Procedure narrative: Transthoracic echocardiography. The study   was technically difficult. - Left ventricle: There was mild to moderate concentric   hypertrophy. Systolic function was normal. The estimated ejection   fraction was in the range of 60% to 65%. - Aortic valve: Valve mobility was restricted. There was moderate   stenosis. Valve area (VTI): 1.3 cm^2. Valve area (Vmax): 1.2   cm^2. Valve area (Vmean): 1.18 cm^2. There appears to be aortic   regurgitation, no PHT was measured. - Mitral valve: Moderately calcified annulus. There was moderate to   severe regurgitation directed centrally. - Left atrium: The atrium was moderately to severely dilated. - Right atrium: The atrium was moderately dilated. - Tricuspid valve: There was moderate-severe regurgitation directed   centrally. - Pulmonic valve: Peak gradient (S): 14 mm Hg. - Pulmonary arteries: Systolic pressure was severely increased.  Patient Profile     82 y.o. female with a h/o paroxysmal atrial flutter on eliquis, chronic diastolic CHF, COPD on nocturnal home oxygen, hypertension, hypothyroidism, CKD stage II presented to hospital secondary to worsening shortness of breath.  Assessment & Plan    1. Acute on chronic diastolic heart failure: Currently receiving IV Lasix every 8Is -810 mL since admission. Would continue diuresis as she has lower extremity edema.. Weight has gone up by 2 pounds since admission. Creatinine remains elevated. She does have continued lower extremity edema. Would continue to diuresis.  2. Paroxysmal atrial fibrillation: In atrial fibrillation on admission, but in sinus rhythm today. Would continue flecainide and Eliquis  3. Essential hypertension: Well-controlled today. No changes necessary.  4. Moderate aortic stenosis: Seen on echocardiogram from 2017.  TTE currently not indicated.  This patients CHA2DS2-VASc Score and unadjusted Ischemic Stroke Rate (% per year) is equal to 4.8 % stroke rate/year from a score of 4  Above score calculated as 1 point each if present [CHF, HTN, DM, Vascular=MI/PAD/Aortic Plaque, Age if 65-74, or Female] Above score calculated as 2 points each if present [Age > 75, or Stroke/TIA/TE]  Signed, Aki Burdin Meredith Leeds, MD  11/05/2016, 11:54 AM

## 2016-11-05 NOTE — Progress Notes (Addendum)
Noted bright  red blood coming  from patient 's  left index finger. Per  Patient her left index fingernail  was caught on her underpad and broke off. Small amount of blood noted and pressure applied to stop the bleeding. No acute distress noted. Will continue to monitor.

## 2016-11-05 NOTE — Progress Notes (Signed)
Pt is due the 4th shot in a series of 4 of B12 shots. MD notified. Order received. I will continue to assess.

## 2016-11-05 NOTE — Progress Notes (Signed)
Pt requesting to sleep and not be woke up for 2am tx

## 2016-11-05 NOTE — Progress Notes (Signed)
Patient ID: Toni [name], female   DOB: Apr 15, 1935, 82 y.o.   MRN: [id number]  [facility] PROGRESS NOTE  Toni [name] [id number]:[id number] DOB: 10/09/1934 DOA: 11/03/2016 PCP: Pcp Not In System  HPI/Subjective: Patient was feeling a little bit better yesterday than today. This morning she was short of breath with exertion. Nonproductive cough. Patient complains of throbbing in her legs at night.  Objective: Vitals:   11/05/16 0459 11/05/16 1117  BP: (!) 100/47 (!) 126/55  Pulse: (!) 59 (!) 55  Resp: 18 18  Temp: 98.3 F (36.8 C) 98.1 F (36.7 C)    Filed Weights   11/03/16 2048 11/04/16 0334 11/05/16 0459  Weight: 86.7 kg (191 lb 3.2 oz) 86.3 kg (190 lb 3.2 oz) 87.4 kg (192 lb 9.6 oz)    ROS: Review of Systems  Constitutional: Negative for chills and fever.  Eyes: Negative for blurred vision.  Respiratory: Positive for cough, shortness of breath and wheezing.   Cardiovascular: Negative for chest pain.  Gastrointestinal: Negative for abdominal pain, constipation, diarrhea, nausea and vomiting.  Genitourinary: Negative for dysuria.  Musculoskeletal: Negative for joint pain.  Neurological: Negative for dizziness and headaches.   Exam: Physical Exam  HENT:  Nose: No mucosal edema.  Mouth/Throat: No oropharyngeal exudate or posterior oropharyngeal edema.  Eyes: Conjunctivae, EOM and lids are normal. Pupils are equal, round, and reactive to light.  Neck: No JVD present. Carotid bruit is not present. No edema present. No thyroid mass and no thyromegaly present.  Cardiovascular: S1 normal and S2 normal.  Exam reveals no gallop.   Murmur heard.  Systolic murmur is present with a grade of 4/6  Pulses:      Dorsalis pedis pulses are 2+ on the right side, and 2+ on the left side.  Respiratory: No respiratory distress. She has decreased breath sounds in the right lower field and the left lower field. She has wheezes in the right middle field, the right lower field, the left middle  field and the left lower field. She has no rhonchi. She has no rales.  GI: Soft. Bowel sounds are normal. There is no tenderness.  Musculoskeletal:       Right ankle: She exhibits swelling.       Left ankle: She exhibits swelling.  Lymphadenopathy:    She has no cervical adenopathy.  Neurological: She is alert. No cranial nerve deficit.  Skin: Skin is warm. No rash noted. Nails show no clubbing.  Bruising upper extremities  Psychiatric: She has a normal mood and affect.      Data Reviewed: Basic Metabolic Panel:  Recent Labs Lab 11/03/16 1625 11/04/16 0842 11/05/16 0414  NA 139 137 139  K 3.8 4.1 4.3  CL 94* 95* 98*  CO2 35* 31 31  GLUCOSE 109* 140* 143*  BUN 33* 37* 52*  CREATININE 1.50* 1.63* 1.69*  CALCIUM 8.8* 9.0 8.8*   Liver Function Tests:  Recent Labs Lab 11/03/16 1625  AST 15  ALT 11*  ALKPHOS 85  BILITOT 0.9  PROT 6.9  ALBUMIN 3.8   CBC:  Recent Labs Lab 11/03/16 1625 11/04/16 0842 11/05/16 0414  WBC 7.8 4.9 7.0  HGB 9.4* 9.5* 8.4*  HCT 27.9* 28.4* 24.7*  MCV 87.1 87.5 86.6  PLT 243 249 258   Cardiac Enzymes:  Recent Labs Lab 11/03/16 1625 11/03/16 2056 11/04/16 0217 11/04/16 0842  TROPONINI 0.03* <0.03 <0.03 0.03*   BNP (last 3 results)  Recent Labs  11/03/16 1625  BNP 338.0*  Studies: Dg Chest 2 View  Result Date: 11/03/2016 CLINICAL DATA:  Acute onset of shortness of breath. Initial encounter. EXAM: CHEST  2 VIEW COMPARISON:  Chest radiograph and CTA of the chest performed 07/30/2016 FINDINGS: The lungs are well-aerated. Vascular congestion is noted. Bilateral central airspace opacification may reflect pulmonary edema or pneumonia. There is no evidence of pleural effusion or pneumothorax. The heart is borderline normal in size. No acute osseous abnormalities are seen. A moderate hiatal hernia is noted. IMPRESSION: 1. Vascular congestion noted. Bilateral central airspace opacification may reflect pulmonary edema or  pneumonia. 2. Moderate hiatal hernia seen. Electronically Signed   By: Garald Balding M.D.   On: 11/03/2016 17:09    Scheduled Meds: . apixaban  5 mg Oral BID  . calcium carbonate  1 tablet Oral BID  . diltiazem  300 mg Oral Daily  . flecainide  100 mg Oral BID  . furosemide  40 mg Intravenous BID  . ipratropium-albuterol  3 mL Nebulization Q6H  . levothyroxine  75 mcg Oral QAC breakfast  . [START ON 11/06/2016] methylPREDNISolone (SOLU-MEDROL) injection  40 mg Intravenous Daily  . omega-3 acid ethyl esters  1 g Oral Daily  . pantoprazole  40 mg Oral BID  . polyethylene glycol  17 g Oral Daily  . rOPINIRole  0.25 mg Oral QHS  . sodium chloride flush  3 mL Intravenous Q12H  . traZODone  50 mg Oral QHS    Assessment/Plan:  1. Acute diastolic congestive heart failure with moderate aortic stenosis. Repeat echocardiogram to evaluate aortic stenosis. Gentle diuresis with IV Lasix. Patient already on flecainide and Cardizem so I will let cardiology decide if they want a beta blocker or not. 2. COPD exacerbation. Decrease Solu-Medrol to daily dosing. Continue nebulizer treatments. Repeat chest x-ray tomorrow 3. Atrial fibrillation. On flecainide and diltiazem. Anticoagulated with Eliquis. 4. Leg pain at night which could be restless leg syndrome. Trial of Requip. 5. Hypothyroidism unspecified on levothyroxine 6. GERD on Protonix 7. Weakness. Physical therapy evaluation 8. History of lung cancer and history of lung nodule  Code Status:     Code Status Orders        Start     Ordered   11/03/16 2040  Full code  Continuous     11/03/16 2039    Code Status History    Date Active Date Inactive Code Status Order ID Comments User Context   02/16/2016  9:27 PM 02/26/2016  2:33 PM Full Code [id number]  Lance Coon, MD Inpatient   11/16/2013 12:33 AM 11/24/2013  6:53 PM Full Code [id number]  Angelica Ran, MD Inpatient     Family Communication: Husband at the bedside Disposition Plan:  Evaluate on a daily basis for potential discharge  Consultants:  Cardiology  Time spent: 28 minutes  Loletha Grayer  [employer]

## 2016-11-05 NOTE — Progress Notes (Signed)
MD notified. Pt reports constipation. Miralax and MOM will be added to patients daily medication . I will continue to assess.

## 2016-11-05 NOTE — Progress Notes (Signed)
MD notified. Pt scheduled '80mg'$  IV lasix. BUN/Creatinine increased from previous labs. MD orders to reduce '80mg'$  to '40mg'$  BID. I will continue to assess.

## 2016-11-05 NOTE — Plan of Care (Signed)
Problem: Respiratory: Goal: Ability to maintain normal oxygenation or baseline will improve Outcome: Progressing Patient is progressing toward basline oxygenation with 2L per [place].

## 2016-11-06 ENCOUNTER — Inpatient Hospital Stay (HOSPITAL_COMMUNITY)
Admit: 2016-11-06 | Discharge: 2016-11-06 | Disposition: A | Discharge status: Home / Self Care | Payer: Medicare Other | Attending: Internal Medicine | Admitting: Internal Medicine

## 2016-11-06 ENCOUNTER — Inpatient Hospital Stay: Payer: Medicare Other

## 2016-11-06 DIAGNOSIS — I5031 Acute diastolic (congestive) heart failure: Secondary | ICD-10-CM

## 2016-11-06 LAB — BASIC METABOLIC PANEL
ANION GAP: 10 (ref 5–15)
BUN: 73 mg/dL — ABNORMAL HIGH (ref 6–20)
CHLORIDE: 96 mmol/L — AB (ref 101–111)
CO2: 30 mmol/L (ref 22–32)
Calcium: 8.7 mg/dL — ABNORMAL LOW (ref 8.9–10.3)
Creatinine, Ser: 1.84 mg/dL — ABNORMAL HIGH (ref 0.44–1.00)
GFR calc non Af Amer: 25 mL/min — ABNORMAL LOW (ref >60)
GFR, EST AFRICAN AMERICAN: 29 mL/min — AB (ref >60)
Glucose, Bld: 132 mg/dL — ABNORMAL HIGH (ref 65–99)
POTASSIUM: 4.1 mmol/L (ref 3.5–5.1)
SODIUM: 136 mmol/L (ref 135–145)

## 2016-11-06 LAB — ECHOCARDIOGRAM COMPLETE
Height: 66 in
Weight: 3129.6 oz

## 2016-11-06 MED ORDER — NYSTATIN 100000 UNIT/ML MT SUSP
5.0000 mL | Freq: Four times a day (QID) | OROMUCOSAL | Status: DC
  Administered 2016-11-06 – 2016-11-19 (×46): 500000 [IU] via ORAL
  Filled 2016-11-06 (×48): qty 5

## 2016-11-06 MED ORDER — AZITHROMYCIN 250 MG PO TABS
250.0000 mg | ORAL_TABLET | Freq: Every day | ORAL | Status: AC
  Administered 2016-11-07 – 2016-11-10 (×4): 250 mg via ORAL
  Filled 2016-11-06 (×4): qty 1

## 2016-11-06 MED ORDER — AZITHROMYCIN 500 MG PO TABS
500.0000 mg | ORAL_TABLET | Freq: Every day | ORAL | Status: AC
  Administered 2016-11-06: 500 mg via ORAL
  Filled 2016-11-06: qty 1

## 2016-11-06 MED ORDER — DEXTROSE 5 % IV SOLN
2.0000 g | INTRAVENOUS | Status: AC
  Administered 2016-11-06 – 2016-11-12 (×7): 2 g via INTRAVENOUS
  Filled 2016-11-06 (×7): qty 2

## 2016-11-06 MED ORDER — DOCUSATE SODIUM 100 MG PO CAPS
100.0000 mg | ORAL_CAPSULE | Freq: Two times a day (BID) | ORAL | Status: DC
  Administered 2016-11-06 – 2016-11-19 (×25): 100 mg via ORAL
  Filled 2016-11-06 (×27): qty 1

## 2016-11-06 NOTE — Progress Notes (Signed)
Patient ID: Toni [name], female   DOB: 12-Feb-1935, 82 y.o.   MRN: [id number]  [facility] PROGRESS NOTE  Toni [name] [id number]:[id number] DOB: [birth year] DOA: 11/03/2016 PCP: Pcp Not In System  HPI/Subjective: Patient states she had the best rest with medication I gave last night. No jumping in her legs or pain in her legs. She states she is breathing okay but still short of breath with moving around. Some cough but nonproductive. Feels some discomfort in her mouth. Some constipation  Objective: Vitals:   11/06/16 0808 11/06/16 1126  BP: (!) 147/55 (!) 130/49  Pulse: 64 60  Resp: 18 18  Temp: 98.4 F (36.9 C) 98.1 F (36.7 C)    Filed Weights   11/04/16 0334 11/05/16 0459 11/06/16 0337  Weight: 86.3 kg (190 lb 3.2 oz) 87.4 kg (192 lb 9.6 oz) 88.7 kg (195 lb 9.6 oz)    ROS: Review of Systems  Constitutional: Negative for chills and fever.  Eyes: Negative for blurred vision.  Respiratory: Positive for cough, shortness of breath and wheezing.   Cardiovascular: Negative for chest pain.  Gastrointestinal: Positive for constipation. Negative for diarrhea, nausea and vomiting.  Genitourinary: Negative for dysuria.  Musculoskeletal: Negative for joint pain.  Neurological: Negative for dizziness and headaches.   Exam: Physical Exam  Constitutional: She is oriented to person, place, and time.  HENT:  Nose: No mucosal edema.  Mouth/Throat: No oropharyngeal exudate or posterior oropharyngeal edema.  Eyes: Conjunctivae, EOM and lids are normal. Pupils are equal, round, and reactive to light.  Neck: No JVD present. Carotid bruit is not present. No edema present. No thyroid mass and no thyromegaly present.  Cardiovascular: S1 normal and S2 normal.  Exam reveals no gallop.   Murmur heard.  Systolic murmur is present with a grade of 4/6  Pulses:      Dorsalis pedis pulses are 2+ on the right side, and 2+ on the left side.  Respiratory: No respiratory distress. She has decreased  breath sounds in the right lower field and the left lower field. She has no wheezes. She has rhonchi in the right lower field and the left lower field. She has no rales.  GI: Soft. Bowel sounds are normal. There is no tenderness.  Musculoskeletal:       Right ankle: She exhibits swelling.       Left ankle: She exhibits swelling.  Lymphadenopathy:    She has no cervical adenopathy.  Neurological: She is alert and oriented to person, place, and time. No cranial nerve deficit.  Skin: Skin is warm. No rash noted. Nails show no clubbing.  Psychiatric: She has a normal mood and affect.      Data Reviewed: Basic Metabolic Panel:  Recent Labs Lab 11/03/16 1625 11/04/16 0842 11/05/16 0414 11/06/16 0440  NA 139 137 139 136  K 3.8 4.1 4.3 4.1  CL 94* 95* 98* 96*  CO2 35* '31 31 30  '$ GLUCOSE 109* 140* 143* 132*  BUN 33* 37* 52* 73*  CREATININE 1.50* 1.63* 1.69* 1.84*  CALCIUM 8.8* 9.0 8.8* 8.7*   Liver Function Tests:  Recent Labs Lab 11/03/16 1625  AST 15  ALT 11*  ALKPHOS 85  BILITOT 0.9  PROT 6.9  ALBUMIN 3.8   CBC:  Recent Labs Lab 11/03/16 1625 11/04/16 0842 11/05/16 0414  WBC 7.8 4.9 7.0  HGB 9.4* 9.5* 8.4*  HCT 27.9* 28.4* 24.7*  MCV 87.1 87.5 86.6  PLT 243 249 258   Cardiac Enzymes:  Recent  Labs Lab 11/03/16 1625 11/03/16 2056 11/04/16 0217 11/04/16 0842  TROPONINI 0.03* <0.03 <0.03 0.03*   BNP (last 3 results)  Recent Labs  11/03/16 1625  BNP 338.0*    Studies: Dg Chest Port 1 View  Result Date: 11/06/2016 CLINICAL DATA:  Cough EXAM: PORTABLE CHEST 1 VIEW COMPARISON:  November 03, 2016 and July 30, 2016 FINDINGS: There is increased airspace consolidation in the left mid lung and right base regions. Elsewhere the interstitium is prominent, likely due to mild interstitial edema superimposed on fibrotic type change. Heart is mildly enlarged with mild pulmonary venous hypertension. No adenopathy. There is aortic atherosclerosis. There is a  hiatal hernia. Bones are osteoporotic with degenerative change in shoulders. IMPRESSION: Increase in airspace consolidation in the left mid lung and to a lesser extent right base regions. Suspect developing multifocal pneumonia, although alveolar edema could present in this manner. There is felt to be a degree of underlying pulmonary vascular congestion with mild interstitial edema. These changes appear essentially stable. There is aortic atherosclerosis. There is a hiatal hernia. Electronically Signed   By: Lowella Grip III M.D.   On: 11/06/2016 07:05    Scheduled Meds: . apixaban  5 mg Oral BID  . [START ON 11/07/2016] azithromycin  250 mg Oral Daily  . budesonide (PULMICORT) nebulizer solution  0.5 mg Nebulization BID  . calcium carbonate  1 tablet Oral BID  . cefTRIAXone (ROCEPHIN)  IV  2 g Intravenous Q24H  . diltiazem  300 mg Oral Daily  . docusate sodium  100 mg Oral BID  . flecainide  100 mg Oral BID  . ipratropium-albuterol  3 mL Nebulization Q6H  . levothyroxine  75 mcg Oral QAC breakfast  . methylPREDNISolone (SOLU-MEDROL) injection  40 mg Intravenous Daily  . nystatin  5 mL Oral QID  . omega-3 acid ethyl esters  1 g Oral Daily  . pantoprazole  40 mg Oral BID  . polyethylene glycol  17 g Oral Daily  . rOPINIRole  0.25 mg Oral QHS  . sodium chloride flush  3 mL Intravenous Q12H  . traZODone  50 mg Oral QHS    Assessment/Plan:  1. COPD exacerbation with pneumonia on repeat chest x-ray. Continue Solu-Medrol. Added Rocephin and Zithromax. Continue nebulizer treatments. 2. Acute diastolic congestive heart failure with moderate to severe aortic stenosis. Cardiology following. Lasix being held this afternoon for increasing creatinine. Patient on diltiazem and flecainide. Defer to cardiology on whether beta blocker can be given. 3. Acute respiratory failure usually only wears oxygen at night. Try to taper off oxygen during the hospital course. 4. Atrial fibrillation. On flecainide  and diltiazem. Anticoagulated with Eliquis 5. Restless leg syndrome. Improved with Requip 6. Acute kidney injury on chronic kidney disease stage III. Hold Lasix this afternoon. Recheck creatinine tomorrow. 7. Weakness. Physical therapy evaluation 8. Hypothyroidism unspecified on levothyroxine 9. GERD on Protonix 10. History of lung cancer and lung nodule  Code Status:     Code Status Orders        Start     Ordered   11/03/16 2040  Full code  Continuous     11/03/16 2039    Code Status History    Date Active Date Inactive Code Status Order ID Comments User Context   02/16/2016  9:27 PM 02/26/2016  2:33 PM Full Code [id number]  Lance Coon, MD Inpatient   11/16/2013 12:33 AM 11/24/2013  6:53 PM Full Code [id number]  Angelica Ran, MD Inpatient     Family  Communication: Husband at the bedside Disposition Plan: To be determined  Consultants:  Cardiology  Antibiotics:  Rocephin  Zithromax  Time spent: 25 minutes  [place], [place]

## 2016-11-06 NOTE — Care Management Important Message (Signed)
Important Message  Patient Details  Name: EMALEY APPLIN MRN: [id number] Date of Birth: 1934-11-28   Medicare Important Message Given:  Yes    Shelbie Ammons, RN 11/06/2016, 12:48 PM

## 2016-11-06 NOTE — Progress Notes (Signed)
*  PRELIMINARY RESULTS* Echocardiogram 2D Echocardiogram has been performed.  Sherrie Sport 11/06/2016, 8:47 AM

## 2016-11-06 NOTE — Care Management (Addendum)
Admitted to [facility] with the diagnosis of CHF. Lives with husband, Darnell Level. Did see Dr Manuella Ghazi but now has an appointment at [facility] to see another physician 11/16/16. Appointment with [facility] [place] [id number]. Home Health per [facility] in the past . No skilled facility. Home oxygen per [facility] 4-5 years.. Wears her oxygen at night or as needed. Cane and rolling walker in the home. Prescriptions are filled at [facility] in [place]. Takes care of all basic activities of daily living herself. Husband does errands. No falls. Good appetite. Husband will transport.  Physical therapy evaluation completed.Marland Kitchen Recommends Home with Home Health  & physical therapy. Would like [facility]. Shelbie Ammons RN MSN CCM Care Management

## 2016-11-06 NOTE — Progress Notes (Signed)
A&O. Up with one assist. On 3L [place] O2. No S/S distress. Receiving nebs, IV lasix and solu-medrol.

## 2016-11-06 NOTE — Progress Notes (Signed)
Patient Name: Toni [name] Date of Encounter: 11/06/2016  Primary Cardiologist: [facility] Problem List     Active Problems:   CHF exacerbation ([place])     Subjective   Slept well overnight. Weight is up 5 pounds this admission to date. Renal function trending upwards. Net - 1.5 L for the admission. Still with SOB and LD swelling. Echo from this morning pending.   Inpatient Medications    Scheduled Meds: . apixaban  5 mg Oral BID  . [START ON 11/07/2016] azithromycin  250 mg Oral Daily  . budesonide (PULMICORT) nebulizer solution  0.5 mg Nebulization BID  . calcium carbonate  1 tablet Oral BID  . cefTRIAXone (ROCEPHIN)  IV  2 g Intravenous Q24H  . diltiazem  300 mg Oral Daily  . flecainide  100 mg Oral BID  . furosemide  40 mg Intravenous BID  . ipratropium-albuterol  3 mL Nebulization Q6H  . levothyroxine  75 mcg Oral QAC breakfast  . methylPREDNISolone (SOLU-MEDROL) injection  40 mg Intravenous Daily  . omega-3 acid ethyl esters  1 g Oral Daily  . pantoprazole  40 mg Oral BID  . polyethylene glycol  17 g Oral Daily  . rOPINIRole  0.25 mg Oral QHS  . sodium chloride flush  3 mL Intravenous Q12H  . traZODone  50 mg Oral QHS   Continuous Infusions:  PRN Meds: acetaminophen **OR** acetaminophen, ALPRAZolam, alum & mag hydroxide-simeth, loratadine, magnesium hydroxide, ondansetron **OR** ondansetron (ZOFRAN) IV, oxyCODONE-acetaminophen, simethicone   Vital Signs    Vitals:   11/06/16 0337 11/06/16 0400 11/06/16 0808 11/06/16 1126  BP: (!) 147/66  (!) 147/55 (!) 130/49  Pulse: (!) 59  64 60  Resp: '16  18 18  '$ Temp: 97.7 F (36.5 C)  98.4 F (36.9 C) 98.1 F (36.7 C)  TempSrc: Oral  Oral Oral  SpO2: 97% 97% 97% 97%  Weight: 195 lb 9.6 oz (88.7 kg)     Height:        Intake/Output Summary (Last 24 hours) at 11/06/16 1159 Last data filed at 11/06/16 0900  Gross per 24 hour  Intake              240 ml  Output             1000 ml  Net             -760  ml   Filed Weights   11/04/16 0334 11/05/16 0459 11/06/16 0337  Weight: 190 lb 3.2 oz (86.3 kg) 192 lb 9.6 oz (87.4 kg) 195 lb 9.6 oz (88.7 kg)    Physical Exam    GEN: Well nourished, well developed, in no acute distress.  HEENT: Grossly normal.  Neck: Supple, no JVD, carotid bruits, or masses. Cardiac: RRR, no murmurs, rubs, or gallops. No clubbing or cyanosis. 1+ pitting LE edema.  Radials/DP/PT 2+ and equal bilaterally.  Respiratory:  Faint bibasilar crackles, left > right. GI: Soft, nontender, nondistended, BS + x 4. MS: no deformity or atrophy. Skin: warm and dry, no rash. Neuro:  Strength and sensation are intact. Psych: AAOx3.  Normal affect.  Labs    CBC  Recent Labs  11/04/16 0842 11/05/16 0414  WBC 4.9 7.0  HGB 9.5* 8.4*  HCT 28.4* 24.7*  MCV 87.5 86.6  PLT 249 130   Basic Metabolic Panel  Recent Labs  11/05/16 0414 11/06/16 0440  NA 139 136  K 4.3 4.1  CL 98* 96*  CO2 31  30  GLUCOSE 143* 132*  BUN 52* 73*  CREATININE 1.69* 1.84*  CALCIUM 8.8* 8.7*   Liver Function Tests  Recent Labs  11/03/16 1625  AST 15  ALT 11*  ALKPHOS 85  BILITOT 0.9  PROT 6.9  ALBUMIN 3.8   No results for input(s): LIPASE, AMYLASE in the last 72 hours. Cardiac Enzymes  Recent Labs  11/03/16 2056 11/04/16 0217 11/04/16 0842  TROPONINI <0.03 <0.03 0.03*   BNP Invalid input(s): POCBNP D-Dimer No results for input(s): DDIMER in the last 72 hours. Hemoglobin A1C No results for input(s): HGBA1C in the last 72 hours. Fasting Lipid Panel No results for input(s): CHOL, HDL, LDLCALC, TRIG, CHOLHDL, LDLDIRECT in the last 72 hours. Thyroid Function Tests No results for input(s): TSH, T4TOTAL, T3FREE, THYROIDAB in the last 72 hours.  Invalid input(s): FREET3  Telemetry    Currently in sinus rhythm, 70's, episodes of PAF rate controlled - Personally Reviewed  ECG    n/a - Personally Reviewed  Radiology    Dg Chest Port 1 View  Result Date:  11/06/2016 CLINICAL DATA:  Cough EXAM: PORTABLE CHEST 1 VIEW COMPARISON:  November 03, 2016 and July 30, 2016 FINDINGS: There is increased airspace consolidation in the left mid lung and right base regions. Elsewhere the interstitium is prominent, likely due to mild interstitial edema superimposed on fibrotic type change. Heart is mildly enlarged with mild pulmonary venous hypertension. No adenopathy. There is aortic atherosclerosis. There is a hiatal hernia. Bones are osteoporotic with degenerative change in shoulders. IMPRESSION: Increase in airspace consolidation in the left mid lung and to a lesser extent right base regions. Suspect developing multifocal pneumonia, although alveolar edema could present in this manner. There is felt to be a degree of underlying pulmonary vascular congestion with mild interstitial edema. These changes appear essentially stable. There is aortic atherosclerosis. There is a hiatal hernia. Electronically Signed   By: Lowella Grip III M.D.   On: 11/06/2016 07:05    Cardiac Studies   TTE 02/2016: Study Conclusions  - Procedure narrative: Transthoracic echocardiography. The study   was technically difficult. - Left ventricle: There was mild to moderate concentric   hypertrophy. Systolic function was normal. The estimated ejection   fraction was in the range of 60% to 65%. - Aortic valve: Valve mobility was restricted. There was moderate   stenosis. Valve area (VTI): 1.3 cm^2. Valve area (Vmax): 1.2   cm^2. Valve area (Vmean): 1.18 cm^2. There appears to be aortic   regurgitation, no PHT was measured. - Mitral valve: Moderately calcified annulus. There was moderate to   severe regurgitation directed centrally. - Left atrium: The atrium was moderately to severely dilated. - Right atrium: The atrium was moderately dilated. - Tricuspid valve: There was moderate-severe regurgitation directed   centrally. - Pulmonic valve: Peak gradient (S): 14 mm Hg. -  Pulmonary arteries: Systolic pressure was severely increased.  Impressions:  - The previous study from 11/17/2013 was reviewed. AS severity was   likely moderate with AVA of 1.34cm2. MR and TR have increased, as   well as PASP.   Echo from 11/06/2016 pending  Patient Profile     82 y.o. female with history of paroxysmal atrial flutter on eliquis, chronic diastolic CHF, COPD on nocturnal home oxygen, hypertension, hypothyroidism, CKD stage II presentedto hospital secondary to worsening shortness of breath.  Assessment & Plan    1. Acute on chronic diastolic CHF: -Has been receiving IV Lasix 80 mg bid since admission with modest UOP -  Weight up 5 pounds since admission -Renal function continues to increase -Hold IV Lasix this afternoon (already given this AM) -Continued LE edema and crackles on exam -Consider third spacing from anemia -Cannot rule out some LE swelling from Cardizem for her Afib  2. Anemia: -Remains low -Likely playing a role in the above -Per IM  3. PAF: -Currently in sinus rhythm -Paroxysmal Afib noted on tele -Currently on Eliquis, monitor with anemia -Does not meet 2/3 reduced dosing criteria for Eliquis -Consider changing to Xarelto if renal function continues to worsen -Cardizem for rate control and flecainide for rhythm control  -If breathing would allow, consider changing to beta blocker for rate control -CHADS2VASc 4  4. Moderate AS: -Echo pending  Signed, Christell Faith, PA-C [facility] Pager: [phone] 11/06/2016, 11:59 AM

## 2016-11-06 NOTE — Progress Notes (Signed)
Initial HF Clinic appointment scheduled for November 14, 2016 at 9:00am. Thank you.

## 2016-11-07 DIAGNOSIS — I272 Pulmonary hypertension, unspecified: Secondary | ICD-10-CM

## 2016-11-07 DIAGNOSIS — D631 Anemia in chronic kidney disease: Secondary | ICD-10-CM

## 2016-11-07 DIAGNOSIS — N189 Chronic kidney disease, unspecified: Secondary | ICD-10-CM

## 2016-11-07 DIAGNOSIS — D649 Anemia, unspecified: Secondary | ICD-10-CM

## 2016-11-07 LAB — CBC
HEMATOCRIT: 24.5 % — AB (ref 35.0–47.0)
Hemoglobin: 7.9 g/dL — ABNORMAL LOW (ref 12.0–16.0)
MCH: 28.5 pg (ref 26.0–34.0)
MCHC: 32.5 g/dL (ref 32.0–36.0)
MCV: 87.7 fL (ref 80.0–100.0)
PLATELETS: 269 10*3/uL (ref 150–440)
RBC: 2.79 MIL/uL — ABNORMAL LOW (ref 3.80–5.20)
RDW: 14.9 % — AB (ref 11.5–14.5)
WBC: 8.2 10*3/uL (ref 3.6–11.0)

## 2016-11-07 LAB — BASIC METABOLIC PANEL
Anion gap: 9 (ref 5–15)
BUN: 73 mg/dL — AB (ref 6–20)
CALCIUM: 8.4 mg/dL — AB (ref 8.9–10.3)
CO2: 32 mmol/L (ref 22–32)
CREATININE: 1.69 mg/dL — AB (ref 0.44–1.00)
Chloride: 98 mmol/L — ABNORMAL LOW (ref 101–111)
GFR calc Af Amer: 32 mL/min — ABNORMAL LOW (ref >60)
GFR, EST NON AFRICAN AMERICAN: 28 mL/min — AB (ref >60)
GLUCOSE: 118 mg/dL — AB (ref 65–99)
POTASSIUM: 3.9 mmol/L (ref 3.5–5.1)
SODIUM: 139 mmol/L (ref 135–145)

## 2016-11-07 LAB — MAGNESIUM: MAGNESIUM: 2.4 mg/dL (ref 1.7–2.4)

## 2016-11-07 MED ORDER — POTASSIUM CHLORIDE CRYS ER 20 MEQ PO TBCR
20.0000 meq | EXTENDED_RELEASE_TABLET | Freq: Two times a day (BID) | ORAL | Status: DC
  Administered 2016-11-07 – 2016-11-14 (×16): 20 meq via ORAL
  Filled 2016-11-07 (×16): qty 1

## 2016-11-07 MED ORDER — LACTULOSE 10 GM/15ML PO SOLN
30.0000 g | Freq: Once | ORAL | Status: AC
  Administered 2016-11-07: 30 g via ORAL
  Filled 2016-11-07: qty 60

## 2016-11-07 MED ORDER — FUROSEMIDE 10 MG/ML IJ SOLN
40.0000 mg | Freq: Two times a day (BID) | INTRAMUSCULAR | Status: DC
  Administered 2016-11-07 – 2016-11-08 (×4): 40 mg via INTRAVENOUS
  Filled 2016-11-07 (×4): qty 4

## 2016-11-07 NOTE — Progress Notes (Signed)
Patient is a devoted [place] who appreciated prayers Chaplain provided. Patient has a hearing issues which were cause by her not having her hearing device. Chaplain prayed with patient before leaving patient's room.

## 2016-11-07 NOTE — Progress Notes (Signed)
Physical Therapy Treatment Patient Details Name: Toni [name] MRN: [id number] DOB: [birth year] Today's Date: 11/07/2016    History of Present Illness presented to ER secondary to worsening SOB; admitted with acute/chronic CHF, COPD.  Currently on 2L supplemental O2 via [place]    PT Comments    Pt is making good progress towards goals with slightly increased ambulation distance this session. Safe technique performed with RW. All mobility performed on 2L of O2 with sats WNL. Pt limited by fatigue and dyspnea on exertion. Good tolerance with there-ex. Will continue to progress.   Follow Up Recommendations  Home health PT     Equipment Recommendations       Recommendations for Other Services       Precautions / Restrictions Precautions Precautions: Fall Restrictions Weight Bearing Restrictions: No    Mobility  Bed Mobility               General bed mobility comments: not performed as pt received in recliner  Transfers Overall transfer level: Needs assistance Equipment used: Rolling walker (2 wheeled) Transfers: Sit to/from Stand Sit to Stand: Supervision         General transfer comment: safe technique with correct sequencing and use of RW. Upright posture noted. 2L of O2 donned for all mobility  Ambulation/Gait Ambulation/Gait assistance: Min guard Ambulation Distance (Feet): 70 Feet Assistive device: Rolling walker (2 wheeled) Gait Pattern/deviations: Step-through pattern     General Gait Details: reciprocal gait pattern performed with slow speed. Increased work of breathing and slight pain with ambulation. Sats at 92% post ambulation. Cued for pursed lip breathing.   Stairs            Wheelchair Mobility    Modified Rankin (Stroke Patients Only)       Balance                                    Cognition Arousal/Alertness: Awake/alert Behavior During Therapy: WFL for tasks assessed/performed Overall Cognitive Status: Within  Functional Limits for tasks assessed                      Exercises Other Exercises Other Exercises: Pt performed seated ther-ex including B LE alt. LAQ, marching, and sit<>Stand using RW. Safe technique performed x 10 reps. CUed for pursed lip breathing.    General Comments        Pertinent Vitals/Pain Pain Assessment: Faces Faces Pain Scale: Hurts little more Pain Location: L hip/knee with ambulation Pain Descriptors / Indicators: Aching;Dull Pain Intervention(s): Repositioned;Limited activity within patient's tolerance    Home Living                      Prior Function            PT Goals (current goals can now be found in the care plan section) Acute Rehab PT Goals Patient Stated Goal: to return home and have therapy there PT Goal Formulation: With patient Time For Goal Achievement: 11/18/16 Potential to Achieve Goals: Good Progress towards PT goals: Progressing toward goals    Frequency    Min 2X/week      PT Plan Current plan remains appropriate    Co-evaluation             End of Session Equipment Utilized During Treatment: Gait belt;Oxygen Activity Tolerance: Patient tolerated treatment well Patient left: in chair;with call bell/phone within reach;with  chair alarm set Nurse Communication: Mobility status PT Visit Diagnosis: Unsteadiness on feet (R26.81);Muscle weakness (generalized) (M62.81)     Time: 0413-6438 PT Time Calculation (min) (ACUTE ONLY): 13 min  Charges:  $Gait Training: 8-22 mins                    G Codes:       Cherina Dhillon 2016-12-06, 4:50 PM  Greggory Stallion, PT, DPT [phone]

## 2016-11-07 NOTE — Progress Notes (Signed)
Patient Name: Toni [name] Date of Encounter: 11/07/2016  Primary Cardiologist: [facility] Problem List     Active Problems:   CHF exacerbation ([place])     Subjective   Weight continues to increase, now up 10 pounds this admission. HGB continues to drop, down to 7.9 this morning. She denies any melena, BRBPR, hematuria, or hematemesis. Net -3 L. Renal function improving with holding of Lasix.   Inpatient Medications    Scheduled Meds: . apixaban  5 mg Oral BID  . azithromycin  250 mg Oral Daily  . budesonide (PULMICORT) nebulizer solution  0.5 mg Nebulization BID  . calcium carbonate  1 tablet Oral BID  . cefTRIAXone (ROCEPHIN)  IV  2 g Intravenous Q24H  . diltiazem  300 mg Oral Daily  . docusate sodium  100 mg Oral BID  . flecainide  100 mg Oral BID  . ipratropium-albuterol  3 mL Nebulization Q6H  . lactulose  30 g Oral Once  . levothyroxine  75 mcg Oral QAC breakfast  . methylPREDNISolone (SOLU-MEDROL) injection  40 mg Intravenous Daily  . nystatin  5 mL Oral QID  . omega-3 acid ethyl esters  1 g Oral Daily  . pantoprazole  40 mg Oral BID  . polyethylene glycol  17 g Oral Daily  . rOPINIRole  0.25 mg Oral QHS  . sodium chloride flush  3 mL Intravenous Q12H  . traZODone  50 mg Oral QHS   Continuous Infusions:  PRN Meds: acetaminophen **OR** acetaminophen, ALPRAZolam, alum & mag hydroxide-simeth, loratadine, magnesium hydroxide, ondansetron **OR** ondansetron (ZOFRAN) IV, oxyCODONE-acetaminophen, simethicone   Vital Signs    Vitals:   11/07/16 0403 11/07/16 0757 11/07/16 0816 11/07/16 0819  BP: (!) 121/53  97/85 (!) 114/102  Pulse: 60  61 68  Resp: 18   20  Temp: 97.6 F (36.4 C)     TempSrc: Oral     SpO2: 96% 96%  100%  Weight: 200 lb 3.2 oz (90.8 kg)     Height:        Intake/Output Summary (Last 24 hours) at 11/07/16 1116 Last data filed at 11/07/16 1002  Gross per 24 hour  Intake              410 ml  Output             1900 ml  Net             -1490 ml   Filed Weights   11/05/16 0459 11/06/16 0337 11/07/16 0403  Weight: 192 lb 9.6 oz (87.4 kg) 195 lb 9.6 oz (88.7 kg) 200 lb 3.2 oz (90.8 kg)    Physical Exam    GEN: Well nourished, well developed, in no acute distress.  HEENT: Grossly normal.  Neck: Supple, no JVD, carotid bruits, or masses. Cardiac: RRR, no murmurs, rubs, or gallops. No clubbing or cyanosis. 1+ pitting LE edema.  Radials/DP/PT 2+ and equal bilaterally.  Respiratory:  Faint bibasilar crackles. GI: Soft, nontender, nondistended, BS + x 4. MS: no deformity or atrophy. Skin: warm and dry, no rash. Neuro:  Strength and sensation are intact. Psych: AAOx3.  Normal affect.  Labs    CBC  Recent Labs  11/05/16 0414 11/07/16 0504  WBC 7.0 8.2  HGB 8.4* 7.9*  HCT 24.7* 24.5*  MCV 86.6 87.7  PLT 258 734   Basic Metabolic Panel  Recent Labs  11/06/16 0440 11/07/16 0504  NA 136 139  K 4.1 3.9  CL  96* 98*  CO2 30 32  GLUCOSE 132* 118*  BUN 73* 73*  CREATININE 1.84* 1.69*  CALCIUM 8.7* 8.4*  MG  --  2.4   Liver Function Tests No results for input(s): AST, ALT, ALKPHOS, BILITOT, PROT, ALBUMIN in the last 72 hours. No results for input(s): LIPASE, AMYLASE in the last 72 hours. Cardiac Enzymes No results for input(s): CKTOTAL, CKMB, CKMBINDEX, TROPONINI in the last 72 hours. BNP Invalid input(s): POCBNP D-Dimer No results for input(s): DDIMER in the last 72 hours. Hemoglobin A1C No results for input(s): HGBA1C in the last 72 hours. Fasting Lipid Panel No results for input(s): CHOL, HDL, LDLCALC, TRIG, CHOLHDL, LDLDIRECT in the last 72 hours. Thyroid Function Tests No results for input(s): TSH, T4TOTAL, T3FREE, THYROIDAB in the last 72 hours.  Invalid input(s): FREET3  Telemetry    Sinus rhythm 70's bpm - Personally Reviewed  ECG    n/a - Personally Reviewed  Radiology    Dg Chest Port 1 View  Result Date: 11/06/2016 CLINICAL DATA:  Cough EXAM: PORTABLE CHEST 1 VIEW  COMPARISON:  November 03, 2016 and July 30, 2016 FINDINGS: There is increased airspace consolidation in the left mid lung and right base regions. Elsewhere the interstitium is prominent, likely due to mild interstitial edema superimposed on fibrotic type change. Heart is mildly enlarged with mild pulmonary venous hypertension. No adenopathy. There is aortic atherosclerosis. There is a hiatal hernia. Bones are osteoporotic with degenerative change in shoulders. IMPRESSION: Increase in airspace consolidation in the left mid lung and to a lesser extent right base regions. Suspect developing multifocal pneumonia, although alveolar edema could present in this manner. There is felt to be a degree of underlying pulmonary vascular congestion with mild interstitial edema. These changes appear essentially stable. There is aortic atherosclerosis. There is a hiatal hernia. Electronically Signed   By: Lowella Grip III M.D.   On: 11/06/2016 07:05    Cardiac Studies   TTE 11/06/16: Study Conclusions  - Left ventricle: The cavity size was normal. There was mild   concentric hypertrophy. Systolic function was normal. The   estimated ejection fraction was in the range of 55% to 60%. Wall   motion was normal; there were no regional wall motion   abnormalities. Features are consistent with a pseudonormal left   ventricular filling pattern, with concomitant abnormal relaxation   and increased filling pressure (grade 2 diastolic dysfunction). - Aortic valve: There was moderate to severe stenosis. Mean   gradient (S): 30 mm Hg. Valve area (VTI): 0.98 cm^2. Valve area   (Vmax): 1.05 cm^2. Valve area (Vmean): 1.01 cm^2. - Mitral valve: Severely calcified annulus. There was moderate   regurgitation. - Left atrium: The atrium was severely dilated. - Right atrium: The atrium was moderately dilated. - Tricuspid valve: There was moderate regurgitation. - Pulmonary arteries: Systolic pressure was severely  increased. PA   peak pressure: 70 mm Hg (S).  Patient Profile     82 y.o. female with history of paroxysmal atrial flutter on eliquis, chronic diastolic CHF, COPD on nocturnal home oxygen, hypertension, hypothyroidism, CKD stage II presentedto hospital secondary to worsening shortness of breath.  Assessment & Plan    1. Acute on chronic diastolic CHF/pulmonary HTN: -Has been receiving IV Lasix 80 mg bid since admission with modest UOP -Weight up 10 pounds since admission, possibly in the setting of 3rd spacing given her anemia -Restart IV Lasix 40 mg bid with KCl repletion given her elevated right heart pressure -Would benefit from  R/LHC when breathing better to further evaluate her pulmonary HTN -Continued LE edema and crackles on exam -Cannot rule out some LE swelling from Cardizem for her Afib  2. Anemia: -HGB continues to down trend to 7.9 today -Denies any melena, BRBPR, hematuria, or hematemesis -Defer work up to IM -Likely playing a role in the above -Will hold Eliquis given her worsening anemia until etiology is discovered   3. PAF: -Currently in sinus rhythm -Paroxysmal Afib noted on tele -Hold Eliquis at this time given her anemia -Cardizem for rate control and flecainide for rhythm control  -If breathing would allow, consider changing to beta blocker for rate control -CHADS2VASc 4  4. Moderate AS: -Echo as above, though suspect some of her gradient readings are slightly exacerbated given her anemia -Would recommend repeat her echo after her anemia is evaluated, treated and improved  Signed, Christell Faith, PA-C [place] Pager: [phone] 11/07/2016, 11:16 AM

## 2016-11-07 NOTE — Progress Notes (Signed)
Patient ID: Toni [name], female   DOB: 1934/09/29, 82 y.o.   MRN: [id number]   [facility] PROGRESS NOTE  Toni [name]:[id number] DOB: 27-Jul-1935 DOA: 11/03/2016 PCP: Pcp Not In System  HPI/Subjective: Patient seen this morning. She had some right-sided abdominal pain but had resolved. Still with some constipation. Still short of breath.  Objective: Vitals:   11/07/16 0819 11/07/16 1125  BP: (!) 114/102 (!) 125/51  Pulse: 68 (!) 55  Resp: 20 18  Temp:  98.2 F (36.8 C)    Filed Weights   11/05/16 0459 11/06/16 0337 11/07/16 0403  Weight: 87.4 kg (192 lb 9.6 oz) 88.7 kg (195 lb 9.6 oz) 90.8 kg (200 lb 3.2 oz)    ROS: Review of Systems  Constitutional: Negative for chills and fever.  Eyes: Negative for blurred vision.  Respiratory: Positive for cough, shortness of breath and wheezing.   Cardiovascular: Negative for chest pain.  Gastrointestinal: Positive for abdominal pain and constipation. Negative for diarrhea, nausea and vomiting.  Genitourinary: Negative for dysuria.  Musculoskeletal: Negative for joint pain.  Neurological: Negative for dizziness and headaches.   Exam: Physical Exam  Constitutional: She is oriented to person, place, and time.  HENT:  Nose: No mucosal edema.  Mouth/Throat: No oropharyngeal exudate or posterior oropharyngeal edema.  Eyes: Conjunctivae, EOM and lids are normal. Pupils are equal, round, and reactive to light.  Neck: No JVD present. Carotid bruit is not present. No edema present. No thyroid mass and no thyromegaly present.  Cardiovascular: S1 normal and S2 normal.  Exam reveals no gallop.   Murmur heard.  Systolic murmur is present with a grade of 4/6  Pulses:      Dorsalis pedis pulses are 2+ on the right side, and 2+ on the left side.  Respiratory: No respiratory distress. She has decreased breath sounds in the right lower field and the left lower field. She has no wheezes. She has rhonchi in the right lower field and the  left lower field. She has no rales.  GI: Soft. Bowel sounds are normal. There is no tenderness.  Musculoskeletal:       Right ankle: She exhibits swelling.       Left ankle: She exhibits swelling.  Lymphadenopathy:    She has no cervical adenopathy.  Neurological: She is alert and oriented to person, place, and time. No cranial nerve deficit.  Skin: Skin is warm. No rash noted. Nails show no clubbing.  Psychiatric: She has a normal mood and affect.      Data Reviewed: Basic Metabolic Panel:  Recent Labs Lab 11/03/16 1625 11/04/16 0842 11/05/16 0414 11/06/16 0440 11/07/16 0504  NA 139 137 139 136 139  K 3.8 4.1 4.3 4.1 3.9  CL 94* 95* 98* 96* 98*  CO2 35* '31 31 30 '$ 32  GLUCOSE 109* 140* 143* 132* 118*  BUN 33* 37* 52* 73* 73*  CREATININE 1.50* 1.63* 1.69* 1.84* 1.69*  CALCIUM 8.8* 9.0 8.8* 8.7* 8.4*  MG  --   --   --   --  2.4   Liver Function Tests:  Recent Labs Lab 11/03/16 1625  AST 15  ALT 11*  ALKPHOS 85  BILITOT 0.9  PROT 6.9  ALBUMIN 3.8   CBC:  Recent Labs Lab 11/03/16 1625 11/04/16 0842 11/05/16 0414 11/07/16 0504  WBC 7.8 4.9 7.0 8.2  HGB 9.4* 9.5* 8.4* 7.9*  HCT 27.9* 28.4* 24.7* 24.5*  MCV 87.1 87.5 86.6 87.7  PLT 243 249 258 269  Cardiac Enzymes:  Recent Labs Lab 11/03/16 1625 11/03/16 2056 11/04/16 0217 11/04/16 0842  TROPONINI 0.03* <0.03 <0.03 0.03*   BNP (last 3 results)  Recent Labs  11/03/16 1625  BNP 338.0*    Studies: Dg Chest Port 1 View  Result Date: 11/06/2016 CLINICAL DATA:  Cough EXAM: PORTABLE CHEST 1 VIEW COMPARISON:  November 03, 2016 and July 30, 2016 FINDINGS: There is increased airspace consolidation in the left mid lung and right base regions. Elsewhere the interstitium is prominent, likely due to mild interstitial edema superimposed on fibrotic type change. Heart is mildly enlarged with mild pulmonary venous hypertension. No adenopathy. There is aortic atherosclerosis. There is a hiatal hernia. Bones  are osteoporotic with degenerative change in shoulders. IMPRESSION: Increase in airspace consolidation in the left mid lung and to a lesser extent right base regions. Suspect developing multifocal pneumonia, although alveolar edema could present in this manner. There is felt to be a degree of underlying pulmonary vascular congestion with mild interstitial edema. These changes appear essentially stable. There is aortic atherosclerosis. There is a hiatal hernia. Electronically Signed   By: Lowella Grip III M.D.   On: 11/06/2016 07:05    Scheduled Meds: . azithromycin  250 mg Oral Daily  . budesonide (PULMICORT) nebulizer solution  0.5 mg Nebulization BID  . calcium carbonate  1 tablet Oral BID  . cefTRIAXone (ROCEPHIN)  IV  2 g Intravenous Q24H  . diltiazem  300 mg Oral Daily  . docusate sodium  100 mg Oral BID  . flecainide  100 mg Oral BID  . furosemide  40 mg Intravenous Q12H  . ipratropium-albuterol  3 mL Nebulization Q6H  . levothyroxine  75 mcg Oral QAC breakfast  . methylPREDNISolone (SOLU-MEDROL) injection  40 mg Intravenous Daily  . nystatin  5 mL Oral QID  . omega-3 acid ethyl esters  1 g Oral Daily  . pantoprazole  40 mg Oral BID  . polyethylene glycol  17 g Oral Daily  . potassium chloride  20 mEq Oral BID  . rOPINIRole  0.25 mg Oral QHS  . sodium chloride flush  3 mL Intravenous Q12H  . traZODone  50 mg Oral QHS    Assessment/Plan:  1. COPD exacerbation with pneumonia on repeat chest x-ray. Continue Solu-Medrol. Added Rocephin and Zithromax Yesterday. Continue nebulizer treatments. 2. Acute diastolic congestive heart failure with moderate to severe aortic stenosis. Cardiology following. Lasix being held this afternoon for increasing creatinine. Patient on diltiazem and flecainide. Defer to cardiology on whether beta blocker can be given. 3. Acute respiratory failure usually only wears oxygen at night. Try to taper off oxygen during the hospital course. 4. Atrial  fibrillation. On flecainide and diltiazem. Holding Eliquis with drop in hemoglobin 5. Restless leg syndrome. Improved with Requip 6. Acute kidney injury on chronic kidney disease stage III. Restarted oral Lasix 7. Weakness. Physical therapy evaluation 8. Hypothyroidism unspecified on levothyroxine 9. GERD on Protonix 10. History of lung cancer and lung nodule 11. Anemia with slowly dropping hemoglobin. Anticoagulation on hold. May end up needing a unit of blood if drops hemoglobin further. 12. Constipation lactulose given. May end up needing Fleet enema or do collect suppositories if that does not help.  Code Status:     Code Status Orders        Start     Ordered   11/03/16 2040  Full code  Continuous     11/03/16 2039    Code Status History    Date Active  Date Inactive Code Status Order ID Comments User Context   02/16/2016  9:27 PM 02/26/2016  2:33 PM Full Code [id number]  Lance Coon, MD Inpatient   11/16/2013 12:33 AM 11/24/2013  6:53 PM Full Code [id number]  Angelica Ran, MD Inpatient     Family Communication: Husband Yesterday Disposition Plan: To be determined  Consultants:  Cardiology  Antibiotics:  Rocephin  Zithromax  Time spent: 24 minutes  [place], [place]

## 2016-11-07 NOTE — Progress Notes (Signed)
States she felt nervous and jittery a little after 1600.  Received xanax which helped for an hour or so, but she now feels emotional.  Will monitor

## 2016-11-08 DIAGNOSIS — D631 Anemia in chronic kidney disease: Secondary | ICD-10-CM

## 2016-11-08 DIAGNOSIS — N189 Chronic kidney disease, unspecified: Secondary | ICD-10-CM

## 2016-11-08 DIAGNOSIS — R05 Cough: Secondary | ICD-10-CM

## 2016-11-08 DIAGNOSIS — R059 Cough, unspecified: Secondary | ICD-10-CM

## 2016-11-08 DIAGNOSIS — R600 Localized edema: Secondary | ICD-10-CM

## 2016-11-08 DIAGNOSIS — J441 Chronic obstructive pulmonary disease with (acute) exacerbation: Secondary | ICD-10-CM

## 2016-11-08 LAB — CBC
HCT: 22.8 % — ABNORMAL LOW (ref 35.0–47.0)
Hemoglobin: 7.7 g/dL — ABNORMAL LOW (ref 12.0–16.0)
MCH: 29.2 pg (ref 26.0–34.0)
MCHC: 33.8 g/dL (ref 32.0–36.0)
MCV: 86.4 fL (ref 80.0–100.0)
PLATELETS: 308 10*3/uL (ref 150–440)
RBC: 2.64 MIL/uL — ABNORMAL LOW (ref 3.80–5.20)
RDW: 14.8 % — ABNORMAL HIGH (ref 11.5–14.5)
WBC: 10.4 10*3/uL (ref 3.6–11.0)

## 2016-11-08 LAB — RETICULOCYTES
RBC.: 3.25 MIL/uL — AB (ref 3.80–5.20)
RETIC CT PCT: 2.8 % (ref 0.4–3.1)
Retic Count, Absolute: 91 10*3/uL (ref 19.0–183.0)

## 2016-11-08 LAB — BASIC METABOLIC PANEL
Anion gap: 7 (ref 5–15)
BUN: 71 mg/dL — AB (ref 6–20)
CALCIUM: 8.2 mg/dL — AB (ref 8.9–10.3)
CO2: 31 mmol/L (ref 22–32)
Chloride: 99 mmol/L — ABNORMAL LOW (ref 101–111)
Creatinine, Ser: 1.65 mg/dL — ABNORMAL HIGH (ref 0.44–1.00)
GFR calc Af Amer: 33 mL/min — ABNORMAL LOW (ref >60)
GFR, EST NON AFRICAN AMERICAN: 28 mL/min — AB (ref >60)
GLUCOSE: 101 mg/dL — AB (ref 65–99)
Potassium: 4.2 mmol/L (ref 3.5–5.1)
Sodium: 137 mmol/L (ref 135–145)

## 2016-11-08 LAB — IRON AND TIBC
Iron: 53 ug/dL (ref 28–170)
SATURATION RATIOS: 16 % (ref 10.4–31.8)
TIBC: 324 ug/dL (ref 250–450)
UIBC: 271 ug/dL

## 2016-11-08 LAB — FERRITIN: FERRITIN: 48 ng/mL (ref 11–307)

## 2016-11-08 LAB — PREPARE RBC (CROSSMATCH)

## 2016-11-08 LAB — ABO/RH: ABO/RH(D): A POS

## 2016-11-08 MED ORDER — FUROSEMIDE 10 MG/ML IJ SOLN
20.0000 mg | Freq: Once | INTRAMUSCULAR | Status: AC
  Administered 2016-11-08: 20 mg via INTRAVENOUS
  Filled 2016-11-08: qty 2

## 2016-11-08 MED ORDER — DILTIAZEM HCL ER COATED BEADS 120 MG PO CP24
120.0000 mg | ORAL_CAPSULE | Freq: Every day | ORAL | Status: DC
  Administered 2016-11-09 – 2016-11-19 (×11): 120 mg via ORAL
  Filled 2016-11-08 (×11): qty 1

## 2016-11-08 MED ORDER — SODIUM CHLORIDE 0.9 % IV SOLN: Freq: Once | INTRAVENOUS | Status: DC

## 2016-11-08 NOTE — Care Management Note (Addendum)
Case Management Note  Patient Details  Name: Toni [name] MRN: [id number] Date of Birth: 06-Jan-1935   Heads up Home health referral called to [facility] for [place].  Patient says her oxygen is nocturnal- then says she uses it when she needs it.  Does have portable tanks.  Request a protable home concentrator.  Contacted [facility] to determine actual order for patient 02.  Expected Discharge Date:                  Expected Discharge Plan:  [place]  In-House Referral:     Discharge planning Services  CM Consult  Post Acute Care Choice:    Choice offered to:  Patient  DME Arranged:    DME Agency:     HH Arranged:  RN, PT, Nurse's Aide [place] Agency:  [place]  Status of Service:  In process, will continue to follow  If discussed at Long Length of Stay Meetings, dates discussed:    Additional Comments:  Katrina Stack, RN 11/08/2016, 1:42 PM

## 2016-11-08 NOTE — Care Management (Signed)
Confirmed current home oxygen order with [facility].  Patient's order is for continuous.  Patient has requested a portable home 02 concentrator.  Requested the order from attending

## 2016-11-08 NOTE — Progress Notes (Signed)
Patient Name: Toni [name] Date of Encounter: 11/08/2016  Primary Cardiologist: [facility] Problem List     Active Problems:   CHF exacerbation ([place])   Anemia     Subjective   SOB persists. Weight up 7 pounds for the admission to date. HGB continues to trend down at 7.7 today. Stool card pending. Renal function stable.   Inpatient Medications    Scheduled Meds: . sodium chloride   Intravenous Once  . azithromycin  250 mg Oral Daily  . budesonide (PULMICORT) nebulizer solution  0.5 mg Nebulization BID  . calcium carbonate  1 tablet Oral BID  . cefTRIAXone (ROCEPHIN)  IV  2 g Intravenous Q24H  . diltiazem  300 mg Oral Daily  . docusate sodium  100 mg Oral BID  . flecainide  100 mg Oral BID  . furosemide  20 mg Intravenous Once  . furosemide  40 mg Intravenous Q12H  . ipratropium-albuterol  3 mL Nebulization Q6H  . levothyroxine  75 mcg Oral QAC breakfast  . methylPREDNISolone (SOLU-MEDROL) injection  40 mg Intravenous Daily  . nystatin  5 mL Oral QID  . omega-3 acid ethyl esters  1 g Oral Daily  . pantoprazole  40 mg Oral BID  . polyethylene glycol  17 g Oral Daily  . potassium chloride  20 mEq Oral BID  . rOPINIRole  0.25 mg Oral QHS  . sodium chloride flush  3 mL Intravenous Q12H  . traZODone  50 mg Oral QHS   Continuous Infusions:  PRN Meds: acetaminophen **OR** acetaminophen, ALPRAZolam, alum & mag hydroxide-simeth, loratadine, magnesium hydroxide, ondansetron **OR** ondansetron (ZOFRAN) IV, oxyCODONE-acetaminophen, simethicone   Vital Signs    Vitals:   11/08/16 0157 11/08/16 0435 11/08/16 0756 11/08/16 1112  BP:  (!) 132/50 (!) 125/54 136/63  Pulse:  (!) 59 (!) 59 (!) 57  Resp:  '18 18 18  '$ Temp:  98.3 F (36.8 C) 97.8 F (36.6 C) 97.9 F (36.6 C)  TempSrc:  Oral Oral Oral  SpO2: 95% 97% 99% 96%  Weight:  197 lb 15.9 oz (89.8 kg)    Height:        Intake/Output Summary (Last 24 hours) at 11/08/16 1228 Last data filed at 11/08/16 0955  Gross per 24 hour  Intake              530 ml  Output              800 ml  Net             -270 ml   Filed Weights   11/06/16 0337 11/07/16 0403 11/08/16 0435  Weight: 195 lb 9.6 oz (88.7 kg) 200 lb 3.2 oz (90.8 kg) 197 lb 15.9 oz (89.8 kg)    Physical Exam    GEN: Well nourished, well developed, in no acute distress.  HEENT: Grossly normal.  Neck: Supple, no JVD, carotid bruits, or masses. Cardiac: Bradycardic, no murmurs, rubs, or gallops. No clubbing, cyanosis. 1+ pitting LE edema.  Radials/DP/PT 2+ and equal bilaterally.  Respiratory:  Respirations regular and unlabored, clear to auscultation bilaterally. GI: Soft, nontender, nondistended, BS + x 4. MS: no deformity or atrophy. Skin: warm and dry, no rash. Neuro:  Strength and sensation are intact. Psych: AAOx3.  Normal affect.  Labs    CBC  Recent Labs  11/07/16 0504 11/08/16 0529  WBC 8.2 10.4  HGB 7.9* 7.7*  HCT 24.5* 22.8*  MCV 87.7 86.4  PLT 269 308  Basic Metabolic Panel  Recent Labs  11/07/16 0504 11/08/16 0529  NA 139 137  K 3.9 4.2  CL 98* 99*  CO2 32 31  GLUCOSE 118* 101*  BUN 73* 71*  CREATININE 1.69* 1.65*  CALCIUM 8.4* 8.2*  MG 2.4  --    Liver Function Tests No results for input(s): AST, ALT, ALKPHOS, BILITOT, PROT, ALBUMIN in the last 72 hours. No results for input(s): LIPASE, AMYLASE in the last 72 hours. Cardiac Enzymes No results for input(s): CKTOTAL, CKMB, CKMBINDEX, TROPONINI in the last 72 hours. BNP Invalid input(s): POCBNP D-Dimer No results for input(s): DDIMER in the last 72 hours. Hemoglobin A1C No results for input(s): HGBA1C in the last 72 hours. Fasting Lipid Panel No results for input(s): CHOL, HDL, LDLCALC, TRIG, CHOLHDL, LDLDIRECT in the last 72 hours. Thyroid Function Tests No results for input(s): TSH, T4TOTAL, T3FREE, THYROIDAB in the last 72 hours.  Invalid input(s): FREET3  Telemetry    Sinus bradycardia, 50's currently, intermittent rate controlled  Afib - Personally Reviewed  ECG    n/a - Personally Reviewed  Radiology    No results found.  Cardiac Studies   TTE 11/06/16: Study Conclusions  - Left ventricle: The cavity size was normal. There was mild concentric hypertrophy. Systolic function was normal. The estimated ejection fraction was in the range of 55% to 60%. Wall motion was normal; there were no regional wall motion abnormalities. Features are consistent with a pseudonormal left ventricular filling pattern, with concomitant abnormal relaxation and increased filling pressure (grade 2 diastolic dysfunction). - Aortic valve: There was moderate to severe stenosis. Mean gradient (S): 30 mm Hg. Valve area (VTI): 0.98 cm^2. Valve area (Vmax): 1.05 cm^2. Valve area (Vmean): 1.01 cm^2. - Mitral valve: Severely calcified annulus. There was moderate regurgitation. - Left atrium: The atrium was severely dilated. - Right atrium: The atrium was moderately dilated. - Tricuspid valve: There was moderate regurgitation. - Pulmonary arteries: Systolic pressure was severely increased. PA peak pressure: 70 mm Hg (S).  Patient Profile     82 y.o. female with history of paroxysmal atrial flutter on eliquis, chronic diastolic CHF, COPD on nocturnal home oxygen, hypertension, hypothyroidism, CKD stage II presentedto hospital secondary to worsening shortness of breath.  Assessment & Plan    1. Acute on chronic diastolic CHF/pulmonary HTN: -Has been receiving IV Lasix 80 mg bid since admission with modest UOP -Weight up 7 pounds since admission, possibly in the setting of 3rd spacing given her anemia -Continue IV Lasix 40 mg bid with KCl repletion given her elevated right heart pressure, renal function stable today -Would benefit from [facility] when breathing better to further evaluate her pulmonary HTN pending workup of her anemia -Continued LE edema and crackles on exam -Cannot rule out some LE swelling from  Cardizem for her Afib  2. Anemia: -HGB continues to down trend to 7.7 today -Denies any melena, BRBPR, hematuria, or hematemesis -Discussed with IM, possible transfusion today -Defer work up to IM -Likely playing a role in the above -Continue to hold Eliquis given her worsening anemia until etiology is discovered   3. PAF: -Currently in sinus rhythm with a bradycardic rate, asymptomatic  -Paroxysmal Afib noted on tele -Hold Eliquis at this time given her anemia -Cardizem for rate control and flecainide for rhythm control  -If breathing would allow, consider changing to beta blocker for rate control -CHADS2VASc 4  4. Moderate AS:  -Echo as above, though suspect some of her gradient readings are slightly exacerbated given  her anemia -Would recommend repeat her echo after her anemia is evaluated, treated and improved   Signed, Christell Faith, PA-C [place] Pager: [phone] 11/08/2016, 12:28 PM    Attending Note Patient seen and examined, agree with detailed note above,  Patient presentation and plan discussed on rounds.   Patient reports worsening leg swelling shortness of breath in the past 2 weeks Review of lab work shows dramatic drop in hemoglobin from July to February She denies any discolored stools concerning for GI bleed Stool cards pending, iron studies are not back yet She received 1 unit packed red blood cells today  On clinical exam, lungs with crackles at the bases halfway up bilaterally, heart sounds irregularly irregular, 2/6 systolic ejection murmur right sternal border, abdomen soft nontender, 1+ pitting edema to below the knees bilaterally left greater than right  Review of lab work shows hemoglobin 7.7  --- Atrial fibrillation Current rhythm is unclear, concerning for underlying atrial fibrillation Clear change in P-wave morphology April 2017 to  05//2017 Currently off her anticoagulation given anemia If anemia felt to be unrelated to GI  bleed, would only restart when hemoglobin has improved At that time would recommend follow-up with Dr. Caryl Comes, EP. He is unavailable this week to consult on the patient -She was previously on flecainide, family reports this was held by primary care  ----Anemia Etiology unclear, stool cards pending, needs full iron studies Previously was on iron pill but stopped secondary to constipation  ----Leg edema Likely multifactorial including anemia, diltiazem, fluid retention Recommend we decrease her diltiazem dose down to 120 mg daily  ----Moderate aortic valve stenosis Will need periodic echocardiography on annual basis  -----Acute on chronic diastolic CHF Exacerbated by severe anemia Agree with continued gentle diuresis  Consider additional unit packed red blood cells x2  Long discussion with family concerning the above Greater than 50% was spent in counseling and coordination of care with patient Total encounter time 35 minutes or more   Signed: Esmond Plants  M.D., Ph.D. [facility]

## 2016-11-08 NOTE — Progress Notes (Signed)
Patient ID: Toni [name], female   DOB: 01-Dec-1934, 82 y.o.   MRN: [id number]   [facility] PROGRESS NOTE  Toni [name] MGN:[id number] DOB: December 14, 1934 DOA: 11/03/2016 PCP: Pcp Not In System  HPI/Subjective: Patient with shortness of breath on exertion. Some cough but nonproductive. Still not feeling well.  Objective: Vitals:   11/08/16 1358 11/08/16 1429  BP: (!) 123/46 (!) 137/47  Pulse: (!) 57 (!) 55  Resp: 20 19  Temp: 98.6 F (37 C) 98 F (36.7 C)    Filed Weights   11/06/16 0337 11/07/16 0403 11/08/16 0435  Weight: 88.7 kg (195 lb 9.6 oz) 90.8 kg (200 lb 3.2 oz) 89.8 kg (197 lb 15.9 oz)    ROS: Review of Systems  Constitutional: Negative for chills and fever.  Eyes: Negative for blurred vision.  Respiratory: Positive for cough, shortness of breath and wheezing.   Cardiovascular: Negative for chest pain.  Gastrointestinal: Negative for abdominal pain, constipation, diarrhea, nausea and vomiting.  Genitourinary: Negative for dysuria.  Musculoskeletal: Negative for joint pain.  Neurological: Negative for dizziness and headaches.   Exam: Physical Exam  Constitutional: She is oriented to person, place, and time.  HENT:  Nose: No mucosal edema.  Mouth/Throat: No oropharyngeal exudate or posterior oropharyngeal edema.  Eyes: Conjunctivae, EOM and lids are normal. Pupils are equal, round, and reactive to light.  Neck: No JVD present. Carotid bruit is not present. No edema present. No thyroid mass and no thyromegaly present.  Cardiovascular: S1 normal and S2 normal.  Exam reveals no gallop.   Murmur heard.  Systolic murmur is present with a grade of 4/6  Pulses:      Dorsalis pedis pulses are 2+ on the right side, and 2+ on the left side.  Respiratory: No respiratory distress. She has decreased breath sounds in the right lower field and the left lower field. She has wheezes in the right middle field, the right lower field, the left middle field and the left lower  field. She has no rhonchi. She has no rales.  GI: Soft. Bowel sounds are normal. There is no tenderness.  Musculoskeletal:       Right ankle: She exhibits swelling.       Left ankle: She exhibits swelling.  Lymphadenopathy:    She has no cervical adenopathy.  Neurological: She is alert and oriented to person, place, and time. No cranial nerve deficit.  Skin: Skin is warm. No rash noted. Nails show no clubbing.  Psychiatric: She has a normal mood and affect.      Data Reviewed: Basic Metabolic Panel:  Recent Labs Lab 11/04/16 0842 11/05/16 0414 11/06/16 0440 11/07/16 0504 11/08/16 0529  NA 137 139 136 139 137  K 4.1 4.3 4.1 3.9 4.2  CL 95* 98* 96* 98* 99*  CO2 '31 31 30 '$ 32 31  GLUCOSE 140* 143* 132* 118* 101*  BUN 37* 52* 73* 73* 71*  CREATININE 1.63* 1.69* 1.84* 1.69* 1.65*  CALCIUM 9.0 8.8* 8.7* 8.4* 8.2*  MG  --   --   --  2.4  --    Liver Function Tests:  Recent Labs Lab 11/03/16 1625  AST 15  ALT 11*  ALKPHOS 85  BILITOT 0.9  PROT 6.9  ALBUMIN 3.8   CBC:  Recent Labs Lab 11/03/16 1625 11/04/16 0842 11/05/16 0414 11/07/16 0504 11/08/16 0529  WBC 7.8 4.9 7.0 8.2 10.4  HGB 9.4* 9.5* 8.4* 7.9* 7.7*  HCT 27.9* 28.4* 24.7* 24.5* 22.8*  MCV 87.1 87.5  86.6 87.7 86.4  PLT 243 249 258 269 308   Cardiac Enzymes:  Recent Labs Lab 11/03/16 1625 11/03/16 2056 11/04/16 0217 11/04/16 0842  TROPONINI 0.03* <0.03 <0.03 0.03*   BNP (last 3 results)  Recent Labs  11/03/16 1625  BNP 338.0*     Scheduled Meds: . sodium chloride   Intravenous Once  . azithromycin  250 mg Oral Daily  . budesonide (PULMICORT) nebulizer solution  0.5 mg Nebulization BID  . calcium carbonate  1 tablet Oral BID  . cefTRIAXone (ROCEPHIN)  IV  2 g Intravenous Q24H  . diltiazem  300 mg Oral Daily  . docusate sodium  100 mg Oral BID  . flecainide  100 mg Oral BID  . furosemide  40 mg Intravenous Q12H  . ipratropium-albuterol  3 mL Nebulization Q6H  . levothyroxine  75 mcg  Oral QAC breakfast  . methylPREDNISolone (SOLU-MEDROL) injection  40 mg Intravenous Daily  . nystatin  5 mL Oral QID  . omega-3 acid ethyl esters  1 g Oral Daily  . pantoprazole  40 mg Oral BID  . polyethylene glycol  17 g Oral Daily  . potassium chloride  20 mEq Oral BID  . rOPINIRole  0.25 mg Oral QHS  . sodium chloride flush  3 mL Intravenous Q12H  . traZODone  50 mg Oral QHS    Assessment/Plan:  1. COPD exacerbation with pneumonia on repeat chest x-ray. Continue Solu-Medrol. Continue Rocephin and Zithromax. Continue nebulizer treatments. 2. Acute diastolic congestive heart failure with moderate to severe aortic stenosis. Cardiology following. Lasix being held this afternoon for increasing creatinine. Patient on diltiazem and flecainide. Defer to cardiology on whether beta blocker can be given. 3. Acute respiratory failure usually only wears oxygen at night. Try to taper off oxygen during the hospital course. 4. Atrial fibrillation. On flecainide and diltiazem. Holding Eliquis with drop in hemoglobin 5. Restless leg syndrome. Improved with Requip 6. Acute kidney injury on chronic kidney disease stage III. Restarted oral Lasix 7. Weakness. Physical therapy evaluation 8. Hypothyroidism unspecified on levothyroxine 9. GERD on Protonix 10. History of lung cancer and lung nodule 11. Anemia with slowly dropping hemoglobin. Anticoagulation on hold. Transfuse 1 unit of packed red blood cells today. Benefits and risks of blood transfusion explained to patient and husband. Lasix prior to blood transfusion. 12. Constipation- patient had bowel movement last night.  Code Status:     Code Status Orders        Start     Ordered   11/03/16 2040  Full code  Continuous     11/03/16 2039    Code Status History    Date Active Date Inactive Code Status Order ID Comments User Context   02/16/2016  9:27 PM 02/26/2016  2:33 PM Full Code [id number]  Lance Coon, MD Inpatient   11/16/2013 12:33 AM  11/24/2013  6:53 PM Full Code [id number]  Angelica Ran, MD Inpatient     Family Communication: Husband Today Disposition Plan: To be determined  Consultants:  Cardiology  Antibiotics:  Rocephin  Zithromax  Time spent: 24 minutes  [place], [place]

## 2016-11-09 DIAGNOSIS — I480 Paroxysmal atrial fibrillation: Secondary | ICD-10-CM

## 2016-11-09 LAB — TYPE AND SCREEN
Blood Product Expiration Date: 201803142359
ISSUE DATE / TIME: 201802211402
Unit Type and Rh: 6200

## 2016-11-09 LAB — BASIC METABOLIC PANEL
ANION GAP: 7 (ref 5–15)
BUN: 68 mg/dL — AB (ref 6–20)
CHLORIDE: 99 mmol/L — AB (ref 101–111)
CO2: 32 mmol/L (ref 22–32)
Calcium: 8.1 mg/dL — ABNORMAL LOW (ref 8.9–10.3)
Creatinine, Ser: 1.71 mg/dL — ABNORMAL HIGH (ref 0.44–1.00)
GFR calc Af Amer: 32 mL/min — ABNORMAL LOW (ref >60)
GFR, EST NON AFRICAN AMERICAN: 27 mL/min — AB (ref >60)
GLUCOSE: 126 mg/dL — AB (ref 65–99)
POTASSIUM: 4.1 mmol/L (ref 3.5–5.1)
Sodium: 138 mmol/L (ref 135–145)

## 2016-11-09 LAB — HEMOGLOBIN: HEMOGLOBIN: 8.9 g/dL — AB (ref 12.0–16.0)

## 2016-11-09 MED ORDER — DIPHENHYDRAMINE HCL 25 MG PO CAPS
25.0000 mg | ORAL_CAPSULE | Freq: Every evening | ORAL | Status: DC | PRN
  Administered 2016-11-09: 25 mg via ORAL
  Filled 2016-11-09: qty 1

## 2016-11-09 MED ORDER — FUROSEMIDE 10 MG/ML IJ SOLN
40.0000 mg | Freq: Two times a day (BID) | INTRAMUSCULAR | Status: DC
  Administered 2016-11-09 (×2): 40 mg via INTRAVENOUS
  Filled 2016-11-09 (×2): qty 4

## 2016-11-09 MED ORDER — FUROSEMIDE 10 MG/ML IJ SOLN: 20.0000 mg | Freq: Two times a day (BID) | INTRAMUSCULAR | Status: DC

## 2016-11-09 NOTE — Progress Notes (Signed)
Patient Name: Toni [name] Date of Encounter: 11/09/2016  Primary Cardiologist: [facility] Problem List     Active Problems:   Acute on chronic diastolic heart failure (HCC)   Anemia in chronic kidney disease   Cough   PAF (paroxysmal atrial fibrillation) (HCC)     Subjective   Received 1 unit pRBC on 2/21 with hgb this morning improved to 8.9 from 7.7 the prior day. Feels like her breathing is a little improved this morning. Up sitting in recliner eating breakfast. Still notes LE swelling. Renal function slightly increased this morning at 1.71 from 1.65 the prior day. K+ at goal. BP 638-756 systolic.   Inpatient Medications    Scheduled Meds: . sodium chloride   Intravenous Once  . azithromycin  250 mg Oral Daily  . budesonide (PULMICORT) nebulizer solution  0.5 mg Nebulization BID  . calcium carbonate  1 tablet Oral BID  . cefTRIAXone (ROCEPHIN)  IV  2 g Intravenous Q24H  . diltiazem  120 mg Oral Daily  . docusate sodium  100 mg Oral BID  . flecainide  100 mg Oral BID  . furosemide  40 mg Intravenous Q12H  . ipratropium-albuterol  3 mL Nebulization Q6H  . levothyroxine  75 mcg Oral QAC breakfast  . methylPREDNISolone (SOLU-MEDROL) injection  40 mg Intravenous Daily  . nystatin  5 mL Oral QID  . omega-3 acid ethyl esters  1 g Oral Daily  . pantoprazole  40 mg Oral BID  . polyethylene glycol  17 g Oral Daily  . potassium chloride  20 mEq Oral BID  . rOPINIRole  0.25 mg Oral QHS  . sodium chloride flush  3 mL Intravenous Q12H  . traZODone  50 mg Oral QHS   Continuous Infusions:  PRN Meds: acetaminophen **OR** acetaminophen, ALPRAZolam, alum & mag hydroxide-simeth, diphenhydrAMINE, loratadine, magnesium hydroxide, ondansetron **OR** ondansetron (ZOFRAN) IV, oxyCODONE-acetaminophen, simethicone   Vital Signs    Vitals:   11/08/16 2025 11/09/16 0201 11/09/16 0337 11/09/16 0730  BP:   (!) 144/52 (!) 143/49  Pulse:   (!) 55 60  Resp:   16 18  Temp:   98  F (36.7 C) 98 F (36.7 C)  TempSrc:    Oral  SpO2: 96% 95% 97% 99%  Weight:   199 lb 4.8 oz (90.4 kg)   Height:        Intake/Output Summary (Last 24 hours) at 11/09/16 0831 Last data filed at 11/09/16 0500  Gross per 24 hour  Intake              891 ml  Output             1450 ml  Net             -559 ml   Filed Weights   11/07/16 0403 11/08/16 0435 11/09/16 0337  Weight: 200 lb 3.2 oz (90.8 kg) 197 lb 15.9 oz (89.8 kg) 199 lb 4.8 oz (90.4 kg)    Physical Exam    GEN: Well nourished, well developed, in no acute distress.  HEENT: Grossly normal.  Neck: Supple, no JVD, carotid bruits, or masses. Cardiac: RRR, no murmurs, rubs, or gallops. No clubbing, cyanosis. 1+ pitting edema to the bilateral knees.  Radials/DP/PT 2+ and equal bilaterally.  Respiratory:  Improved crackles, continued expiratory wheezing bilaterally. GI: Soft, nontender, nondistended, BS + x 4. MS: no deformity or atrophy. Skin: warm and dry, no rash. Neuro:  Strength and sensation are intact. Psych:  AAOx3.  Normal affect.  Labs    CBC  Recent Labs  11/07/16 0504 11/08/16 0529 11/09/16 0313  WBC 8.2 10.4  --   HGB 7.9* 7.7* 8.9*  HCT 24.5* 22.8*  --   MCV 87.7 86.4  --   PLT 269 308  --    Basic Metabolic Panel  Recent Labs  11/07/16 0504 11/08/16 0529 11/09/16 0313  NA 139 137 138  K 3.9 4.2 4.1  CL 98* 99* 99*  CO2 32 31 32  GLUCOSE 118* 101* 126*  BUN 73* 71* 68*  CREATININE 1.69* 1.65* 1.71*  CALCIUM 8.4* 8.2* 8.1*  MG 2.4  --   --    Liver Function Tests No results for input(s): AST, ALT, ALKPHOS, BILITOT, PROT, ALBUMIN in the last 72 hours. No results for input(s): LIPASE, AMYLASE in the last 72 hours. Cardiac Enzymes No results for input(s): CKTOTAL, CKMB, CKMBINDEX, TROPONINI in the last 72 hours. BNP Invalid input(s): POCBNP D-Dimer No results for input(s): DDIMER in the last 72 hours. Hemoglobin A1C No results for input(s): HGBA1C in the last 72 hours. Fasting  Lipid Panel No results for input(s): CHOL, HDL, LDLCALC, TRIG, CHOLHDL, LDLDIRECT in the last 72 hours. Thyroid Function Tests No results for input(s): TSH, T4TOTAL, T3FREE, THYROIDAB in the last 72 hours.  Invalid input(s): FREET3  Telemetry    Appears to be sinus rhythm 60's bpm though P waves are difficult to see, intermittent Afib, rate controlled - Personally Reviewed  ECG    n/a - Personally Reviewed  Radiology    No results found.  Cardiac Studies   TTE 11/06/16: Study Conclusions  - Left ventricle: The cavity size was normal. There was mild concentric hypertrophy. Systolic function was normal. The estimated ejection fraction was in the range of 55% to 60%. Wall motion was normal; there were no regional wall motion abnormalities. Features are consistent with a pseudonormal left ventricular filling pattern, with concomitant abnormal relaxation and increased filling pressure (grade 2 diastolic dysfunction). - Aortic valve: There was moderate to severe stenosis. Mean gradient (S): 30 mm Hg. Valve area (VTI): 0.98 cm^2. Valve area (Vmax): 1.05 cm^2. Valve area (Vmean): 1.01 cm^2. - Mitral valve: Severely calcified annulus. There was moderate regurgitation. - Left atrium: The atrium was severely dilated. - Right atrium: The atrium was moderately dilated. - Tricuspid valve: There was moderate regurgitation. - Pulmonary arteries: Systolic pressure was severely increased. PA peak pressure: 70 mm Hg (S).  Patient Profile     82 y.o. female with history of paroxysmal atrial flutter on eliquis, chronic diastolic CHF, COPD on nocturnal home oxygen, hypertension, hypothyroidism, CKD stage II presentedto hospital secondary to worsening shortness of breath.  Assessment & Plan    1. Acute on chronic diastolic CHF/pulmonary HTN: -Had been receiving IV Lasix 80 mg bid upon admission with worsening renal function, held 2/19, restarted on IV Lasix 40 mg bid on  2/20 with good UOP and relatively stable renal function until this morning with a slight bump on SCr as above  -Weight up 9pounds since admission as of 2/22 -Given slight decline in renal function will decrease IV Lasix to 20 mg bid with adjusted KCl repletion given her elevated right heart pressure and LE swelling -Would benefit from [facility] when breathing better to further evaluate her pulmonary HTN pending workup of her anemia -Continued LE edema with improved crackles on exam, though wheezing persists -Cannot rule out some LE swelling from Cardizem for her Afib, Cardizem decreased to 120  mg daily on 2/21 -Continues to wheeze, receiving breathing treatments  2. Anemia: -HGB down to 7.7 on 2/21, status post 1 unit pRBC on 2/21 with hgb 8.9 morning of 2/22 and some improvement in SOB -Denies any melena, BRBPR, hematuria, or hematemesis -Maintain hgb > 8.5 -Previously on iron -Iron studies normal at this time -Cannot rule out GI bleed -Likely playing a role in the above -Continue to hold Eliquis given her worsening anemia until etiology is discovered   3. PAF: -Appears to be in sinus rhythm currently with heart rate in the 60's bpm -Check 12-lead EKG -Paroxysmal Afib noted on tele -Continue to hold Eliquis at this time given her anemia -Cardizem, now 120 mg daily for rate control and flecainide for rhythm control  -If breathing would allow, consider changing to beta blocker for rate control given her LE swelling -CHADS2VASc 4  4. Moderate AS:  -Echo as above, though suspect some of her gradient readings are slightly exacerbated given her anemia -Would recommend repeat her echo after her anemia is evaluated, treated and improved   Signed, Christell Faith, PA-C [place] Pager: [phone] 11/09/2016, 8:31 AM

## 2016-11-09 NOTE — Progress Notes (Signed)
Patient ID: Toni [name], female   DOB: 1934-10-14, 82 y.o.   MRN: [id number]   [employer] PROGRESS NOTE  PENELOPI MIKRUT VOZ:[id number] DOB: [birth year] DOA: 11/03/2016 PCP: Pcp Not In System  HPI/Subjective: Patient's major complaint is shortness of breath with exertion. Still having cough but nonproductive. Still short of breath and wheeze.  Objective: Vitals:   11/09/16 0730 11/09/16 1142  BP: (!) 143/49 (!) 124/59  Pulse: 60 60  Resp: 18 20  Temp: 98 F (36.7 C) 98.1 F (36.7 C)    Filed Weights   11/07/16 0403 11/08/16 0435 11/09/16 0337  Weight: 90.8 kg (200 lb 3.2 oz) 89.8 kg (197 lb 15.9 oz) 90.4 kg (199 lb 4.8 oz)    ROS: Review of Systems  Constitutional: Negative for chills and fever.  Eyes: Negative for blurred vision.  Respiratory: Positive for cough, shortness of breath and wheezing.   Cardiovascular: Negative for chest pain.  Gastrointestinal: Negative for abdominal pain, constipation, diarrhea, nausea and vomiting.  Genitourinary: Negative for dysuria.  Musculoskeletal: Negative for joint pain.  Neurological: Negative for dizziness and headaches.   Exam: Physical Exam  Constitutional: She is oriented to person, place, and time.  HENT:  Nose: No mucosal edema.  Mouth/Throat: No oropharyngeal exudate or posterior oropharyngeal edema.  Eyes: Conjunctivae, EOM and lids are normal. Pupils are equal, round, and reactive to light.  Neck: No JVD present. Carotid bruit is not present. No edema present. No thyroid mass and no thyromegaly present.  Cardiovascular: S1 normal and S2 normal.  Exam reveals no gallop.   Murmur heard.  Systolic murmur is present with a grade of 4/6  Pulses:      Dorsalis pedis pulses are 2+ on the right side, and 2+ on the left side.  Respiratory: No respiratory distress. She has decreased breath sounds in the right lower field and the left lower field. She has no wheezes. She has no rhonchi. She has no rales.  GI: Soft. Bowel  sounds are normal. There is no tenderness.  Musculoskeletal:       Right ankle: She exhibits swelling.       Left ankle: She exhibits swelling.  Lymphadenopathy:    She has no cervical adenopathy.  Neurological: She is alert and oriented to person, place, and time. No cranial nerve deficit.  Skin: Skin is warm. No rash noted. Nails show no clubbing.  Psychiatric: She has a normal mood and affect.      Data Reviewed: Basic Metabolic Panel:  Recent Labs Lab 11/05/16 0414 11/06/16 0440 11/07/16 0504 11/08/16 0529 11/09/16 0313  NA 139 136 139 137 138  K 4.3 4.1 3.9 4.2 4.1  CL 98* 96* 98* 99* 99*  CO2 31 30 32 31 32  GLUCOSE 143* 132* 118* 101* 126*  BUN 52* 73* 73* 71* 68*  CREATININE 1.69* 1.84* 1.69* 1.65* 1.71*  CALCIUM 8.8* 8.7* 8.4* 8.2* 8.1*  MG  --   --  2.4  --   --    Liver Function Tests:  Recent Labs Lab 11/03/16 1625  AST 15  ALT 11*  ALKPHOS 85  BILITOT 0.9  PROT 6.9  ALBUMIN 3.8   CBC:  Recent Labs Lab 11/03/16 1625 11/04/16 0842 11/05/16 0414 11/07/16 0504 11/08/16 0529 11/09/16 0313  WBC 7.8 4.9 7.0 8.2 10.4  --   HGB 9.4* 9.5* 8.4* 7.9* 7.7* 8.9*  HCT 27.9* 28.4* 24.7* 24.5* 22.8*  --   MCV 87.1 87.5 86.6 87.7 86.4  --  PLT 243 249 258 269 308  --    Cardiac Enzymes:  Recent Labs Lab 11/03/16 1625 11/03/16 2056 11/04/16 0217 11/04/16 0842  TROPONINI 0.03* <0.03 <0.03 0.03*   BNP (last 3 results)  Recent Labs  11/03/16 1625  BNP 338.0*     Scheduled Meds: . sodium chloride   Intravenous Once  . azithromycin  250 mg Oral Daily  . budesonide (PULMICORT) nebulizer solution  0.5 mg Nebulization BID  . calcium carbonate  1 tablet Oral BID  . cefTRIAXone (ROCEPHIN)  IV  2 g Intravenous Q24H  . diltiazem  120 mg Oral Daily  . docusate sodium  100 mg Oral BID  . flecainide  100 mg Oral BID  . furosemide  40 mg Intravenous Q12H  . ipratropium-albuterol  3 mL Nebulization Q6H  . levothyroxine  75 mcg Oral QAC breakfast   . methylPREDNISolone (SOLU-MEDROL) injection  40 mg Intravenous Daily  . nystatin  5 mL Oral QID  . omega-3 acid ethyl esters  1 g Oral Daily  . pantoprazole  40 mg Oral BID  . polyethylene glycol  17 g Oral Daily  . potassium chloride  20 mEq Oral BID  . rOPINIRole  0.25 mg Oral QHS  . sodium chloride flush  3 mL Intravenous Q12H  . traZODone  50 mg Oral QHS    Assessment/Plan:  1. COPD exacerbation with pneumonia on repeat chest x-ray. Continue Solu-Medrol. Continue Rocephin and Zithromax. Continue nebulizer treatments. 2. Acute diastolic congestive heart failure with moderate to severe aortic stenosis. Cardiology following. Lasix Restarted and IV. Patient on diltiazem and flecainide. No beta blocker as per cardiology 3. Acute respiratory failure usually only wears oxygen at night. Try to taper off oxygen during the hospital course. 4. Atrial fibrillation. On flecainide and diltiazem. Holding Eliquis with drop in hemoglobin. 5. Restless leg syndrome. Improved with Requip 6. Acute kidney injury on chronic kidney disease stage III. Watch closely on IV Lasix 7. Weakness. Physical therapy evaluation 8. Hypothyroidism unspecified on levothyroxine 9. GERD on Protonix 10. History of lung cancer and lung nodule 11. Anemia with slowly dropping hemoglobin. Anticoagulation on hold. Responded well to blood transfusion 12. Constipation- resolved  Code Status:     Code Status Orders        Start     Ordered   11/03/16 2040  Full code  Continuous     11/03/16 2039    Code Status History    Date Active Date Inactive Code Status Order ID Comments User Context   02/16/2016  9:27 PM 02/26/2016  2:33 PM Full Code [id number]  Lance Coon, MD Inpatient   11/16/2013 12:33 AM 11/24/2013  6:53 PM Full Code [id number]  Angelica Ran, MD Inpatient     Family Communication: Husband Today Disposition Plan: To be  determined  Consultants:  Cardiology  Antibiotics:  Rocephin  Zithromax  Time spent: 23 minutes  [place], [place]

## 2016-11-09 NOTE — Progress Notes (Signed)
Physical Therapy Treatment Patient Details Name: Toni [name] MRN: [id number] DOB: 07/19/1935 Today's Date: 11/09/2016    History of Present Illness presented to ER secondary to worsening SOB; admitted with acute/chronic CHF, COPD.  Currently on 2L supplemental O2 via [place]    PT Comments    Pt in chair, ready to ambulate.  Stood and was able to ambulate 105' with walker and min guard.  Assist for O2 tank management.  Pt limited with gait due to general fatigue and SOB.  Sats decreased to 82% with gait today but returned to 98% with seated rest and cues for proper breathing.     Follow Up Recommendations  Home health PT     Equipment Recommendations  Rolling walker with 5" wheels    Recommendations for Other Services       Precautions / Restrictions Precautions Precautions: Fall Restrictions Weight Bearing Restrictions: No    Mobility  Bed Mobility Overal bed mobility: Needs Assistance Bed Mobility: Supine to Sit     Supine to sit: Min guard        Transfers Overall transfer level: Needs assistance Equipment used: Rolling walker (2 wheeled) Transfers: Sit to/from Stand Sit to Stand: Supervision         General transfer comment: safe technique with correct sequencing and use of RW. Upright posture noted. 2L of O2 donned for all mobility  Ambulation/Gait Ambulation/Gait assistance: Min guard Ambulation Distance (Feet): 70 Feet Assistive device: Rolling walker (2 wheeled)     Gait velocity interpretation: <1.8 ft/sec, indicative of risk for recurrent falls General Gait Details: reciprocal gait pattern.  Sats 82% after gait on 2 lpm SOB, returned to 98% with seated rest.   Stairs            Wheelchair Mobility    Modified Rankin (Stroke Patients Only)       Balance Overall balance assessment: Needs assistance Sitting-balance support: No upper extremity supported;Feet supported Sitting balance-Leahy Scale: Good     Standing balance support:  Bilateral upper extremity supported Standing balance-Leahy Scale: Fair                      Cognition Arousal/Alertness: Awake/alert Behavior During Therapy: WFL for tasks assessed/performed Overall Cognitive Status: Within Functional Limits for tasks assessed                      Exercises Other Exercises Other Exercises: toilet transfer with min guard.    General Comments        Pertinent Vitals/Pain Pain Assessment: 0-10 Faces Pain Scale: Hurts little more Pain Location: L hip/knee with ambulation Pain Descriptors / Indicators: Aching;Dull Pain Intervention(s): Limited activity within patient's tolerance    Home Living                      Prior Function            PT Goals (current goals can now be found in the care plan section) Progress towards PT goals: Progressing toward goals    Frequency    Min 2X/week      PT Plan Current plan remains appropriate    Co-evaluation             End of Session Equipment Utilized During Treatment: Gait belt;Oxygen Activity Tolerance: Patient tolerated treatment well;Patient limited by fatigue Patient left: in chair;with call bell/phone within reach;with chair alarm set;with family/visitor present Nurse Communication: Other (comment)  Time: 1010-1027 PT Time Calculation (min) (ACUTE ONLY): 17 min  Charges:  $Gait Training: 8-22 mins                    G Codes:       Chesley Noon 12-03-16, 11:09 AM

## 2016-11-09 NOTE — Progress Notes (Signed)
Notified cardio in person. Dunn P.A. About cardizem and flecinide dosages due and pts HR is 60. Orders to give doses. I will continue to assess.

## 2016-11-09 NOTE — Progress Notes (Signed)
Pt complains of not being able to sleep. Notified MD. Awaiting orders. Will continue to monitor and assess.

## 2016-11-10 DIAGNOSIS — I509 Heart failure, unspecified: Secondary | ICD-10-CM

## 2016-11-10 DIAGNOSIS — N183 Chronic kidney disease, stage 3 (moderate): Secondary | ICD-10-CM

## 2016-11-10 LAB — BASIC METABOLIC PANEL
ANION GAP: 10 (ref 5–15)
BUN: 64 mg/dL — ABNORMAL HIGH (ref 6–20)
CALCIUM: 8.2 mg/dL — AB (ref 8.9–10.3)
CO2: 32 mmol/L (ref 22–32)
CREATININE: 1.53 mg/dL — AB (ref 0.44–1.00)
Chloride: 100 mmol/L — ABNORMAL LOW (ref 101–111)
GFR, EST AFRICAN AMERICAN: 36 mL/min — AB (ref >60)
GFR, EST NON AFRICAN AMERICAN: 31 mL/min — AB (ref >60)
Glucose, Bld: 108 mg/dL — ABNORMAL HIGH (ref 65–99)
Potassium: 4.3 mmol/L (ref 3.5–5.1)
SODIUM: 142 mmol/L (ref 135–145)

## 2016-11-10 LAB — HEMOGLOBIN: HEMOGLOBIN: 9 g/dL — AB (ref 12.0–16.0)

## 2016-11-10 MED ORDER — FUROSEMIDE 10 MG/ML IJ SOLN
40.0000 mg | Freq: Two times a day (BID) | INTRAMUSCULAR | Status: DC
  Administered 2016-11-10 – 2016-11-11 (×3): 40 mg via INTRAVENOUS
  Filled 2016-11-10 (×3): qty 4

## 2016-11-10 MED ORDER — ALPRAZOLAM 0.25 MG PO TABS
0.2500 mg | ORAL_TABLET | Freq: Four times a day (QID) | ORAL | Status: DC | PRN
  Administered 2016-11-10 – 2016-11-18 (×12): 0.25 mg via ORAL
  Filled 2016-11-10 (×12): qty 1

## 2016-11-10 NOTE — Care Management Important Message (Signed)
Important Message  Patient Details  Name: Toni [name] MRN: [id number] Date of Birth: 1935-04-11   Medicare Important Message Given:  Yes    Katrina Stack, RN 11/10/2016, 2:04 PM

## 2016-11-10 NOTE — Care Management (Signed)
[facility] care [phone] of anticipated discharge over the weekend.  have faxed clinical information.  Will only need to fax home health order for PT SN and Aide at discharge. [facility] is aware that attending has entered the order for patient's portable home concentrator.  Agency will provide this after discharge home.  Patient/husband aware and will have own portable tank for transport home

## 2016-11-10 NOTE — Progress Notes (Signed)
CCMD reported a 2.08 second pause. MD Marcille Blanco made aware. Pt sleeping comfortably. Will continue to monitor

## 2016-11-10 NOTE — Progress Notes (Signed)
Patient: Toni [name] / Admit Date: 11/03/2016 / Date of Encounter: 11/10/2016, 10:37 AM   Hospital Problem List     Active Problems:   Acute on chronic diastolic heart failure (HCC)   Anemia in chronic kidney disease   Cough   PAF (paroxysmal atrial fibrillation) (HCC)     Subjective  Reports she is feeling somewhat better Had blood transfusion Legs are less swollen Still with thick cough, chronic issue   Inpatient Medications    Scheduled Meds: . sodium chloride   Intravenous Once  . budesonide (PULMICORT) nebulizer solution  0.5 mg Nebulization BID  . calcium carbonate  1 tablet Oral BID  . cefTRIAXone (ROCEPHIN)  IV  2 g Intravenous Q24H  . diltiazem  120 mg Oral Daily  . docusate sodium  100 mg Oral BID  . flecainide  100 mg Oral BID  . furosemide  40 mg Intravenous Q12H  . ipratropium-albuterol  3 mL Nebulization Q6H  . levothyroxine  75 mcg Oral QAC breakfast  . methylPREDNISolone (SOLU-MEDROL) injection  40 mg Intravenous Daily  . nystatin  5 mL Oral QID  . omega-3 acid ethyl esters  1 g Oral Daily  . pantoprazole  40 mg Oral BID  . polyethylene glycol  17 g Oral Daily  . potassium chloride  20 mEq Oral BID  . rOPINIRole  0.25 mg Oral QHS  . sodium chloride flush  3 mL Intravenous Q12H  . traZODone  50 mg Oral QHS   Continuous Infusions:  PRN Meds: acetaminophen **OR** acetaminophen, ALPRAZolam, alum & mag hydroxide-simeth, diphenhydrAMINE, loratadine, magnesium hydroxide, ondansetron **OR** ondansetron (ZOFRAN) IV, oxyCODONE-acetaminophen, simethicone   Objective: Telemetry: Likely sinus bradycardia with junctional escape rhythm Physical Exam: Blood pressure (!) 139/47, pulse 62, temperature 98 F (36.7 C), temperature source Oral, resp. rate 16, height '5\' 6"'$  (1.676 m), weight 198 lb 1.6 oz (89.9 kg), SpO2 96 %. Body mass index is 31.97 kg/m. GEN: Well nourished, well developed, in no acute distress.  HEENT: Grossly normal.  Neck: Supple, no JVD,  carotid bruits, or masses. Cardiac: Bradycardic, no murmurs, rubs, or gallops. No clubbing, cyanosis. 1+ pitting LE edema.  Radials/DP/PT 2+ and equal bilaterally.  Respiratory:  Respirations regular and unlabored, clear to auscultation bilaterally. GI: Soft, nontender, nondistended, BS + x 4. MS: no deformity or atrophy. Skin: warm and dry, no rash. Neuro:  Strength and sensation are intact. Psych: AAOx3.  Normal affect.   Intake/Output Summary (Last 24 hours) at 11/10/16 1037 Last data filed at 11/10/16 0900  Gross per 24 hour  Intake              600 ml  Output             1975 ml  Net            -1375 ml     Labs: CBC  Recent Labs  11/08/16 0529 11/09/16 0313 11/10/16 0333  WBC 10.4  --   --   HGB 7.7* 8.9* 9.0*  HCT 22.8*  --   --   MCV 86.4  --   --   PLT 308  --   --    Basic Metabolic Panel  Recent Labs  11/09/16 0313 11/10/16 0333  NA 138 142  K 4.1 4.3  CL 99* 100*  CO2 32 32  GLUCOSE 126* 108*  BUN 68* 64*  CREATININE 1.71* 1.53*  CALCIUM 8.1* 8.2*   Liver Function Tests No results for input(s): AST,  ALT, ALKPHOS, BILITOT, PROT, ALBUMIN in the last 72 hours. No results for input(s): LIPASE, AMYLASE in the last 72 hours. Cardiac Enzymes No results for input(s): CKTOTAL, CKMB, CKMBINDEX, TROPONINI in the last 72 hours. BNP Invalid input(s): POCBNP D-Dimer No results for input(s): DDIMER in the last 72 hours. Hemoglobin A1C No results for input(s): HGBA1C in the last 72 hours. Fasting Lipid Panel No results for input(s): CHOL, HDL, LDLCALC, TRIG, CHOLHDL, LDLDIRECT in the last 72 hours. Thyroid Function Tests No results for input(s): TSH, T4TOTAL, T3FREE, THYROIDAB in the last 72 hours.  Invalid input(s): FREET3  Weights: Filed Weights   11/08/16 0435 11/09/16 0337 11/10/16 0335  Weight: 197 lb 15.9 oz (89.8 kg) 199 lb 4.8 oz (90.4 kg) 198 lb 1.6 oz (89.9 kg)     Radiology/Studies:  Dg Chest 2 View  Result Date: 11/03/2016 CLINICAL  DATA:  Acute onset of shortness of breath. Initial encounter. EXAM: CHEST  2 VIEW COMPARISON:  Chest radiograph and CTA of the chest performed 07/30/2016 FINDINGS: The lungs are well-aerated. Vascular congestion is noted. Bilateral central airspace opacification may reflect pulmonary edema or pneumonia. There is no evidence of pleural effusion or pneumothorax. The heart is borderline normal in size. No acute osseous abnormalities are seen. A moderate hiatal hernia is noted. IMPRESSION: 1. Vascular congestion noted. Bilateral central airspace opacification may reflect pulmonary edema or pneumonia. 2. Moderate hiatal hernia seen. Electronically Signed   By: Garald Balding M.D.   On: 11/03/2016 17:09   Dg Chest Port 1 View  Result Date: 11/06/2016 CLINICAL DATA:  Cough EXAM: PORTABLE CHEST 1 VIEW COMPARISON:  November 03, 2016 and July 30, 2016 FINDINGS: There is increased airspace consolidation in the left mid lung and right base regions. Elsewhere the interstitium is prominent, likely due to mild interstitial edema superimposed on fibrotic type change. Heart is mildly enlarged with mild pulmonary venous hypertension. No adenopathy. There is aortic atherosclerosis. There is a hiatal hernia. Bones are osteoporotic with degenerative change in shoulders. IMPRESSION: Increase in airspace consolidation in the left mid lung and to a lesser extent right base regions. Suspect developing multifocal pneumonia, although alveolar edema could present in this manner. There is felt to be a degree of underlying pulmonary vascular congestion with mild interstitial edema. These changes appear essentially stable. There is aortic atherosclerosis. There is a hiatal hernia. Electronically Signed   By: Lowella Grip III M.D.   On: 11/06/2016 07:05     Assessment and Plan  82 y.o. female   --- Atrial fibrillation EKG is reviewed, rhythm concerning for atrial fibrillation though unable to exclude sinus bradycardia with  junctional escape rhythm at 50 bpm Clear change in P-wave morphology April 2017 to  05//2017 Currently off her anticoagulation given anemia Recommend we hold the flecainide  Follow-up as an outpatient with EP. We will make an appointment  ----Anemia Etiology unclear, stool cards pending? Normal iron studies, reticulocyte count We need stool cards or to determine if she is GI bleeding prior to restarting eliquis  ----Leg edema Likely multifactorial including anemia, diltiazem, fluid retention Continue lowered dose diltiazem Will likely continue to wean down and possibly off the diltiazem as an outpatient  ----Moderate aortic valve stenosis Will need periodic echocardiography on annual basis  -----Acute on chronic diastolic CHF Exacerbated by severe anemia, COPD  continue gentle diuresis    Signed, Esmond Plants, MD, Ph.D. [facility] 11/10/2016, 10:37 AM

## 2016-11-10 NOTE — Progress Notes (Signed)
MD notified to change frequency of xanax. Orders to change to every 6 hours prn.  Pt is tearful and requesting anxiety medication. I will continue to assess.

## 2016-11-10 NOTE — Progress Notes (Signed)
Physical Therapy Treatment Patient Details Name: Toni [name] MRN: [id number] DOB: 1934/10/12 Today's Date: 11/10/2016    History of Present Illness presented to ER secondary to worsening SOB; admitted with acute/chronic CHF, COPD.  Currently on 2L supplemental O2 via Melbeta    PT Comments    Pt is making gradual improvement towards goals with slightly improved ambulation distance noted this session, however fatigues quickly and O2 sats decrease to 82% with exertion. Pt requires cues for pursed lip breathing during ambulation. Decreased power and strength noted with 5 time sit<>stand testing. Safe technique used with RW and obstacle avoidance. Will continue to progress.   Follow Up Recommendations  Home health PT     Equipment Recommendations  Rolling walker with 5" wheels    Recommendations for Other Services       Precautions / Restrictions Precautions Precautions: Fall Restrictions Weight Bearing Restrictions: No    Mobility  Bed Mobility               General bed mobility comments: not performed as pt received in recliner  Transfers Overall transfer level: Needs assistance Equipment used: Rolling walker (2 wheeled) Transfers: Sit to/from Stand Sit to Stand: Supervision         General transfer comment: safe technique with correct sequencing and use of RW. Upright posture noted. 2L of O2 donned for all mobility  Ambulation/Gait Ambulation/Gait assistance: Supervision Ambulation Distance (Feet): 80 Feet Assistive device: Rolling walker (2 wheeled) Gait Pattern/deviations: Step-through pattern     General Gait Details: slow gait speed noted, however safe technique. Pt desats with increased ambulation to 83% on 2L of O2. Pt cued for pursed lip breathing with sats able to rise quickly back to 95%. 2L of O2 donned for all mobility. Reciprocal gait pattern performed   Stairs            Wheelchair Mobility    Modified Rankin (Stroke Patients Only)        Balance                                    Cognition Arousal/Alertness: Awake/alert Behavior During Therapy: WFL for tasks assessed/performed Overall Cognitive Status: Within Functional Limits for tasks assessed                      Exercises Other Exercises Other Exercises: Pt assisted for bathroom tasks, needed min assist for hygiene and donning underwear. Safe technique performed with hygiene.  Other Exercises: Pt performed 5 time sit<>stand test in 28 seconds this date using hands for push off. Pt with SOB symptoms noted with exertion.    General Comments        Pertinent Vitals/Pain Pain Assessment: No/denies pain    Home Living                      Prior Function            PT Goals (current goals can now be found in the care plan section) Acute Rehab PT Goals Patient Stated Goal: to return home and have therapy there PT Goal Formulation: With patient Time For Goal Achievement: 11/18/16 Potential to Achieve Goals: Good Progress towards PT goals: Progressing toward goals    Frequency    Min 2X/week      PT Plan Current plan remains appropriate    Co-evaluation  End of Session Equipment Utilized During Treatment: Gait belt;Oxygen Activity Tolerance: Patient tolerated treatment well;Patient limited by fatigue Patient left: in chair;with call bell/phone within reach;with chair alarm set;with family/visitor present Nurse Communication: Mobility status PT Visit Diagnosis: Unsteadiness on feet (R26.81);Muscle weakness (generalized) (M62.81)     Time: 6950-7225 PT Time Calculation (min) (ACUTE ONLY): 24 min  Charges:  $Gait Training: 8-22 mins $Therapeutic Activity: 8-22 mins                    G Codes:       Cianni Manny 11-25-2016, 5:09 PM  Greggory Stallion, PT, DPT [phone]

## 2016-11-10 NOTE — Progress Notes (Signed)
Patient ID: Toni [name], female   DOB: 1935/08/22, 82 y.o.   MRN: [id number]    [employer] PROGRESS NOTE  Toni [name] NLG:[id number] DOB: 07-02-1935 DOA: 11/03/2016 PCP: Pcp Not In System  HPI/Subjective: Patient's major complaint is shortness of breath with exertion. Still having cough but nonproductive. Patient had some pauses overnight and flecainide was stopped by cardiology  Objective: Vitals:   11/10/16 0749 11/10/16 1200  BP: (!) 139/47 (!) 142/58  Pulse: 62 77  Resp: 16 14  Temp: 98 F (36.7 C) 98.2 F (36.8 C)    Filed Weights   11/08/16 0435 11/09/16 0337 11/10/16 0335  Weight: 89.8 kg (197 lb 15.9 oz) 90.4 kg (199 lb 4.8 oz) 89.9 kg (198 lb 1.6 oz)    ROS: Review of Systems  Constitutional: Negative for chills and fever.  Eyes: Negative for blurred vision.  Respiratory: Positive for cough, shortness of breath and wheezing.   Cardiovascular: Negative for chest pain.  Gastrointestinal: Negative for abdominal pain, constipation, diarrhea, nausea and vomiting.  Genitourinary: Negative for dysuria.  Musculoskeletal: Negative for joint pain.  Neurological: Negative for dizziness and headaches.   Exam: Physical Exam  Constitutional: She is oriented to person, place, and time.  HENT:  Nose: No mucosal edema.  Mouth/Throat: No oropharyngeal exudate or posterior oropharyngeal edema.  Eyes: Conjunctivae, EOM and lids are normal. Pupils are equal, round, and reactive to light.  Neck: No JVD present. Carotid bruit is not present. No edema present. No thyroid mass and no thyromegaly present.  Cardiovascular: S1 normal and S2 normal.  Exam reveals no gallop.   Murmur heard.  Systolic murmur is present with a grade of 4/6  Pulses:      Dorsalis pedis pulses are 2+ on the right side, and 2+ on the left side.  Respiratory: No respiratory distress. She has decreased breath sounds in the right lower field and the left lower field. She has no wheezes. She has no  rhonchi. She has no rales.  GI: Soft. Bowel sounds are normal. There is no tenderness.  Musculoskeletal:       Right ankle: She exhibits swelling.       Left ankle: She exhibits swelling.  Lymphadenopathy:    She has no cervical adenopathy.  Neurological: She is alert and oriented to person, place, and time. No cranial nerve deficit.  Skin: Skin is warm. No rash noted. Nails show no clubbing.  Psychiatric: She has a normal mood and affect.      Data Reviewed: Basic Metabolic Panel:  Recent Labs Lab 11/06/16 0440 11/07/16 0504 11/08/16 0529 11/09/16 0313 11/10/16 0333  NA 136 139 137 138 142  K 4.1 3.9 4.2 4.1 4.3  CL 96* 98* 99* 99* 100*  CO2 30 32 31 32 32  GLUCOSE 132* 118* 101* 126* 108*  BUN 73* 73* 71* 68* 64*  CREATININE 1.84* 1.69* 1.65* 1.71* 1.53*  CALCIUM 8.7* 8.4* 8.2* 8.1* 8.2*  MG  --  2.4  --   --   --    Liver Function Tests:  Recent Labs Lab 11/03/16 1625  AST 15  ALT 11*  ALKPHOS 85  BILITOT 0.9  PROT 6.9  ALBUMIN 3.8   CBC:  Recent Labs Lab 11/03/16 1625 11/04/16 0842 11/05/16 0414 11/07/16 0504 11/08/16 0529 11/09/16 0313 11/10/16 0333  WBC 7.8 4.9 7.0 8.2 10.4  --   --   HGB 9.4* 9.5* 8.4* 7.9* 7.7* 8.9* 9.0*  HCT 27.9* 28.4* 24.7* 24.5* 22.8*  --   --  MCV 87.1 87.5 86.6 87.7 86.4  --   --   PLT 243 249 258 269 308  --   --    Cardiac Enzymes:  Recent Labs Lab 11/03/16 1625 11/03/16 2056 11/04/16 0217 11/04/16 0842  TROPONINI 0.03* <0.03 <0.03 0.03*   BNP (last 3 results)  Recent Labs  11/03/16 1625  BNP 338.0*     Scheduled Meds: . sodium chloride   Intravenous Once  . budesonide (PULMICORT) nebulizer solution  0.5 mg Nebulization BID  . calcium carbonate  1 tablet Oral BID  . cefTRIAXone (ROCEPHIN)  IV  2 g Intravenous Q24H  . diltiazem  120 mg Oral Daily  . docusate sodium  100 mg Oral BID  . furosemide  40 mg Intravenous Q12H  . ipratropium-albuterol  3 mL Nebulization Q6H  . levothyroxine  75 mcg  Oral QAC breakfast  . methylPREDNISolone (SOLU-MEDROL) injection  40 mg Intravenous Daily  . nystatin  5 mL Oral QID  . omega-3 acid ethyl esters  1 g Oral Daily  . pantoprazole  40 mg Oral BID  . polyethylene glycol  17 g Oral Daily  . potassium chloride  20 mEq Oral BID  . rOPINIRole  0.25 mg Oral QHS  . sodium chloride flush  3 mL Intravenous Q12H  . traZODone  50 mg Oral QHS    Assessment/Plan:  1. COPD exacerbation with pneumonia on repeat chest x-ray. Continue Solu-Medrol. Continue Rocephin and Zithromax. Continue nebulizer treatments.  Very slow to progress 2. Acute diastolic congestive heart failure with moderate to severe aortic stenosis. Cardiology following. Lasix IV twice a day. Cardiology stopped flecainide. No beta blocker as per cardiology. Patient on diltiazem 3. Acute respiratory failure usually only wears oxygen at night. Try to taper off oxygen during the hospital course. 4. Atrial fibrillation. On diltiazem. Holding Eliquis with drop in hemoglobin. Stopped flecainide with pauses. 5. Restless leg syndrome. Improved with Requip 6. Acute kidney injury on chronic kidney disease stage III. Watch closely on IV Lasix 7. Weakness. Physical therapy evaluation appreciated 8. Hypothyroidism unspecified on levothyroxine 9. GERD on Protonix 10. History of lung cancer and lung nodule 11. Anemia with slowly dropping hemoglobin. Anticoagulation on hold. Responded well to blood transfusion 12. Constipation- resolved  Code Status:     Code Status Orders        Start     Ordered   11/03/16 2040  Full code  Continuous     11/03/16 2039    Code Status History    Date Active Date Inactive Code Status Order ID Comments User Context   02/16/2016  9:27 PM 02/26/2016  2:33 PM Full Code [id number]  Lance Coon, MD Inpatient   11/16/2013 12:33 AM 11/24/2013  6:53 PM Full Code [id number]  Angelica Ran, MD Inpatient     Family Communication: Husband Today Disposition Plan: To be  determined  Consultants:  Cardiology  Antibiotics:  Rocephin  Zithromax  Time spent: 25 minutes  [place], [place]

## 2016-11-11 LAB — BASIC METABOLIC PANEL
ANION GAP: 10 (ref 5–15)
BUN: 59 mg/dL — ABNORMAL HIGH (ref 6–20)
CO2: 32 mmol/L (ref 22–32)
Calcium: 8.2 mg/dL — ABNORMAL LOW (ref 8.9–10.3)
Chloride: 98 mmol/L — ABNORMAL LOW (ref 101–111)
Creatinine, Ser: 1.48 mg/dL — ABNORMAL HIGH (ref 0.44–1.00)
GFR calc Af Amer: 38 mL/min — ABNORMAL LOW (ref >60)
GFR calc non Af Amer: 32 mL/min — ABNORMAL LOW (ref >60)
GLUCOSE: 90 mg/dL (ref 65–99)
POTASSIUM: 4.2 mmol/L (ref 3.5–5.1)
Sodium: 140 mmol/L (ref 135–145)

## 2016-11-11 LAB — HEMOGLOBIN: Hemoglobin: 9.2 g/dL — ABNORMAL LOW (ref 12.0–16.0)

## 2016-11-11 LAB — FERRITIN: Ferritin: 56 ng/mL (ref 11–307)

## 2016-11-11 MED ORDER — FUROSEMIDE 10 MG/ML IJ SOLN
60.0000 mg | Freq: Two times a day (BID) | INTRAMUSCULAR | Status: DC
  Administered 2016-11-11 – 2016-11-13 (×5): 60 mg via INTRAVENOUS
  Filled 2016-11-11 (×5): qty 6

## 2016-11-11 NOTE — Progress Notes (Addendum)
Progress Note  Patient Name: KALISSA GRAYS Date of Encounter: 11/11/2016  Primary Cardiologist: Rockey Situ  Subjective   Dyspnea improved but still not back to baseline  Inpatient Medications    Scheduled Meds: . sodium chloride   Intravenous Once  . budesonide (PULMICORT) nebulizer solution  0.5 mg Nebulization BID  . calcium carbonate  1 tablet Oral BID  . cefTRIAXone (ROCEPHIN)  IV  2 g Intravenous Q24H  . diltiazem  120 mg Oral Daily  . docusate sodium  100 mg Oral BID  . furosemide  40 mg Intravenous Q12H  . ipratropium-albuterol  3 mL Nebulization Q6H  . levothyroxine  75 mcg Oral QAC breakfast  . methylPREDNISolone (SOLU-MEDROL) injection  40 mg Intravenous Daily  . nystatin  5 mL Oral QID  . omega-3 acid ethyl esters  1 g Oral Daily  . pantoprazole  40 mg Oral BID  . polyethylene glycol  17 g Oral Daily  . potassium chloride  20 mEq Oral BID  . rOPINIRole  0.25 mg Oral QHS  . sodium chloride flush  3 mL Intravenous Q12H  . traZODone  50 mg Oral QHS   Continuous Infusions:  PRN Meds: acetaminophen **OR** acetaminophen, ALPRAZolam, alum & mag hydroxide-simeth, diphenhydrAMINE, loratadine, magnesium hydroxide, ondansetron **OR** ondansetron (ZOFRAN) IV, oxyCODONE-acetaminophen, simethicone   Vital Signs    Vitals:   11/10/16 1957 11/10/16 2000 11/11/16 0121 11/11/16 0513  BP: (!) 119/46   (!) 122/52  Pulse: (!) 59   63  Resp: 18   18  Temp: 98.1 F (36.7 C)   97.7 F (36.5 C)  TempSrc: Oral   Oral  SpO2: 96% 96% 95% 90%  Weight:    198 lb 1.6 oz (89.9 kg)  Height:        Intake/Output Summary (Last 24 hours) at 11/11/16 0744 Last data filed at 11/11/16 0208  Gross per 24 hour  Intake              720 ml  Output              300 ml  Net              420 ml   Filed Weights   11/09/16 0337 11/10/16 0335 11/11/16 0513  Weight: 199 lb 4.8 oz (90.4 kg) 198 lb 1.6 oz (89.9 kg) 198 lb 1.6 oz (89.9 kg)    Telemetry    Atrial fib with a controlled VR -  Personally Reviewed    Physical Exam   GEN: No acute distress.   Neck: 6 cm JVD Cardiac: IRIRR, 2/6 systolic murmur c/w AS, rubs, or gallops.  Respiratory: Clear to auscultation bilaterally except for occaisional scattered rales in the bases GI: Soft, nontender, non-distended  MS: No edema; No deformity. Neuro:  Nonfocal  Psych: Normal affect   Labs    Chemistry Recent Labs Lab 11/09/16 0313 11/10/16 0333 11/11/16 0519  NA 138 142 140  K 4.1 4.3 4.2  CL 99* 100* 98*  CO2 32 32 32  GLUCOSE 126* 108* 90  BUN 68* 64* 59*  CREATININE 1.71* 1.53* 1.48*  CALCIUM 8.1* 8.2* 8.2*  GFRNONAA 27* 31* 32*  GFRAA 32* 36* 38*  ANIONGAP '7 10 10     '$ Hematology Recent Labs Lab 11/05/16 0414 11/07/16 0504 11/08/16 0529 11/08/16 2004 11/09/16 0313 11/10/16 0333 11/11/16 0519  WBC 7.0 8.2 10.4  --   --   --   --   RBC 2.86* 2.79* 2.64* 3.25*  --   --   --  HGB 8.4* 7.9* 7.7*  --  8.9* 9.0* 9.2*  HCT 24.7* 24.5* 22.8*  --   --   --   --   MCV 86.6 87.7 86.4  --   --   --   --   MCH 29.4 28.5 29.2  --   --   --   --   MCHC 34.0 32.5 33.8  --   --   --   --   RDW 14.8* 14.9* 14.8*  --   --   --   --   PLT 258 269 308  --   --   --   --     Cardiac Enzymes Recent Labs Lab 11/04/16 0842  TROPONINI 0.03*   No results for input(s): TROPIPOC in the last 168 hours.   BNPNo results for input(s): BNP, PROBNP in the last 168 hours.   DDimer No results for input(s): DDIMER in the last 168 hours.   Radiology    No results found.   Patient Profile     82 y.o. female hx of  Paroxysmal atrial fib, anemia and acute on chronic diastolic CHF  Assessment & Plan    1  Atrial fib - her ventricular rate is controlled. Continue current meds.  2  Acute on chronic diastlic CHF  - she still has some rales on exam. I am not sure what her dry weight is. At this point, consider uptitrating her diuretic until her creatinine goes up to high1's or low 2's.   3  Mod AS  F/U as outpt with  yearly or so echos.  4. Anemia - her hgb is improved.   Signed, Cristopher Peru, MD  11/11/2016, 7:44 AM

## 2016-11-11 NOTE — Progress Notes (Signed)
Patient ID: Toni [name], female   DOB: October 21, 1934, 82 y.o.   MRN: [id number]    [facility] PROGRESS NOTE  CADIENCE BRADFIELD [id number]:[id number] DOB: 06-Jul-1935 DOA: 11/03/2016 PCP: Pcp Not In System  HPI/Subjective: Patient's major complaint is shortness of breath with exertion. Still having cough but nonproductive. Patient only feels slightly better since being here. As per husband, she walks about 50 feet and can be short of breath at home.  Objective: Vitals:   11/11/16 0800 11/11/16 1237  BP: (!) 124/42 (!) 127/55  Pulse: 65 94  Resp: 18   Temp: 97.8 F (36.6 C)     Filed Weights   11/09/16 0337 11/10/16 0335 11/11/16 0513  Weight: 90.4 kg (199 lb 4.8 oz) 89.9 kg (198 lb 1.6 oz) 89.9 kg (198 lb 1.6 oz)    ROS: Review of Systems  Constitutional: Negative for chills and fever.  Eyes: Negative for blurred vision.  Respiratory: Positive for cough and shortness of breath.   Cardiovascular: Negative for chest pain.  Gastrointestinal: Negative for abdominal pain, constipation, diarrhea, nausea and vomiting.  Genitourinary: Negative for dysuria.  Musculoskeletal: Negative for joint pain.  Neurological: Negative for dizziness and headaches.   Exam: Physical Exam  Constitutional: She is oriented to person, place, and time.  HENT:  Nose: No mucosal edema.  Mouth/Throat: No oropharyngeal exudate or posterior oropharyngeal edema.  Eyes: Conjunctivae, EOM and lids are normal. Pupils are equal, round, and reactive to light.  Neck: No JVD present. Carotid bruit is not present. No edema present. No thyroid mass and no thyromegaly present.  Cardiovascular: S1 normal and S2 normal.  Exam reveals no gallop.   Murmur heard.  Systolic murmur is present with a grade of 4/6  Pulses:      Dorsalis pedis pulses are 2+ on the right side, and 2+ on the left side.  Respiratory: No respiratory distress. She has decreased breath sounds in the right lower field and the left lower field. She has  no wheezes. She has no rhonchi. She has no rales.  GI: Soft. Bowel sounds are normal. There is no tenderness.  Musculoskeletal:       Right ankle: She exhibits swelling.       Left ankle: She exhibits swelling.  Lymphadenopathy:    She has no cervical adenopathy.  Neurological: She is alert and oriented to person, place, and time. No cranial nerve deficit.  Skin: Skin is warm. No rash noted. Nails show no clubbing.  Psychiatric: She has a normal mood and affect.      Data Reviewed: Basic Metabolic Panel:  Recent Labs Lab 11/07/16 0504 11/08/16 0529 11/09/16 0313 11/10/16 0333 11/11/16 0519  NA 139 137 138 142 140  K 3.9 4.2 4.1 4.3 4.2  CL 98* 99* 99* 100* 98*  CO2 32 31 32 32 32  GLUCOSE 118* 101* 126* 108* 90  BUN 73* 71* 68* 64* 59*  CREATININE 1.69* 1.65* 1.71* 1.53* 1.48*  CALCIUM 8.4* 8.2* 8.1* 8.2* 8.2*  MG 2.4  --   --   --   --    CBC:  Recent Labs Lab 11/05/16 0414 11/07/16 0504 11/08/16 0529 11/09/16 0313 11/10/16 0333 11/11/16 0519  WBC 7.0 8.2 10.4  --   --   --   HGB 8.4* 7.9* 7.7* 8.9* 9.0* 9.2*  HCT 24.7* 24.5* 22.8*  --   --   --   MCV 86.6 87.7 86.4  --   --   --  PLT 258 269 308  --   --   --    BNP (last 3 results)  Recent Labs  11/03/16 1625  BNP 338.0*     Scheduled Meds: . sodium chloride   Intravenous Once  . budesonide (PULMICORT) nebulizer solution  0.5 mg Nebulization BID  . calcium carbonate  1 tablet Oral BID  . cefTRIAXone (ROCEPHIN)  IV  2 g Intravenous Q24H  . diltiazem  120 mg Oral Daily  . docusate sodium  100 mg Oral BID  . furosemide  60 mg Intravenous BID  . ipratropium-albuterol  3 mL Nebulization Q6H  . levothyroxine  75 mcg Oral QAC breakfast  . methylPREDNISolone (SOLU-MEDROL) injection  40 mg Intravenous Daily  . nystatin  5 mL Oral QID  . omega-3 acid ethyl esters  1 g Oral Daily  . pantoprazole  40 mg Oral BID  . polyethylene glycol  17 g Oral Daily  . potassium chloride  20 mEq Oral BID  .  rOPINIRole  0.25 mg Oral QHS  . sodium chloride flush  3 mL Intravenous Q12H  . traZODone  50 mg Oral QHS    Assessment/Plan:  1. COPD exacerbation with pneumonia on repeat chest x-ray. Continue Solu-Medrol. Continue Rocephin and Zithromax. Continue nebulizer treatments.  Very slow to progress. 2. Acute diastolic congestive heart failure with moderate to severe aortic stenosis. Cardiology following. Increase Lasix '60mg'$  IV twice a day. Cardiology stopped flecainide. No beta blocker as per cardiology. Patient on diltiazem 3. Acute respiratory failure usually only wears oxygen at night. Try to taper off oxygen during the hospital course. 4. Atrial fibrillation. On diltiazem. Holding Eliquis with drop in hemoglobin. Stopped flecainide with pauses. 5. Restless leg syndrome. Improved with Requip 6. Acute kidney injury on chronic kidney disease stage III. Watch closely on IV Lasix 7. Weakness. Physical therapy evaluation appreciated 8. Hypothyroidism unspecified on levothyroxine 9. GERD on Protonix 10. History of lung cancer and lung nodule 11. Anemia with slowly dropping hemoglobin. Anticoagulation on hold. Responded well to blood transfusion 12. Constipation- resolved  Code Status:     Code Status Orders        Start     Ordered   11/03/16 2040  Full code  Continuous     11/03/16 2039    Code Status History    Date Active Date Inactive Code Status Order ID Comments User Context   02/16/2016  9:27 PM 02/26/2016  2:33 PM Full Code [id number]  Lance Coon, MD Inpatient   11/16/2013 12:33 AM 11/24/2013  6:53 PM Full Code [id number]  Angelica Ran, MD Inpatient     Family Communication: Husband Today Disposition Plan: To be determined  Consultants:  Cardiology  Antibiotics:  Rocephin  Zithromax finished  Time spent: 25 minutes  [place], [place]

## 2016-11-11 NOTE — Progress Notes (Signed)
Patient has been moved to room 238 due to her bathroom leaking. No acute distress noted. Mr. Darnell Level ( Husband ) is informed of the room change. Will continue to monitor.

## 2016-11-12 ENCOUNTER — Inpatient Hospital Stay: Payer: Medicare Other

## 2016-11-12 LAB — BASIC METABOLIC PANEL
ANION GAP: 8 (ref 5–15)
BUN: 58 mg/dL — ABNORMAL HIGH (ref 6–20)
CHLORIDE: 98 mmol/L — AB (ref 101–111)
CO2: 33 mmol/L — AB (ref 22–32)
Calcium: 8.1 mg/dL — ABNORMAL LOW (ref 8.9–10.3)
Creatinine, Ser: 1.39 mg/dL — ABNORMAL HIGH (ref 0.44–1.00)
GFR calc non Af Amer: 35 mL/min — ABNORMAL LOW (ref >60)
GFR, EST AFRICAN AMERICAN: 41 mL/min — AB (ref >60)
Glucose, Bld: 101 mg/dL — ABNORMAL HIGH (ref 65–99)
Potassium: 4.3 mmol/L (ref 3.5–5.1)
Sodium: 139 mmol/L (ref 135–145)

## 2016-11-12 LAB — HEMOGLOBIN: Hemoglobin: 9.1 g/dL — ABNORMAL LOW (ref 12.0–16.0)

## 2016-11-12 NOTE — Progress Notes (Signed)
Progress Note  Patient Name: Toni [name] Date of Encounter: 11/12/2016  Primary Cardiologist: Dr. Candis Musa  Subjective   Dyspnea a bit better  Inpatient Medications    Scheduled Meds: . sodium chloride   Intravenous Once  . budesonide (PULMICORT) nebulizer solution  0.5 mg Nebulization BID  . calcium carbonate  1 tablet Oral BID  . diltiazem  120 mg Oral Daily  . docusate sodium  100 mg Oral BID  . furosemide  60 mg Intravenous BID  . ipratropium-albuterol  3 mL Nebulization Q6H  . levothyroxine  75 mcg Oral QAC breakfast  . methylPREDNISolone (SOLU-MEDROL) injection  40 mg Intravenous Daily  . nystatin  5 mL Oral QID  . omega-3 acid ethyl esters  1 g Oral Daily  . pantoprazole  40 mg Oral BID  . polyethylene glycol  17 g Oral Daily  . potassium chloride  20 mEq Oral BID  . rOPINIRole  0.25 mg Oral QHS  . sodium chloride flush  3 mL Intravenous Q12H  . traZODone  50 mg Oral QHS   Continuous Infusions:  PRN Meds: acetaminophen **OR** acetaminophen, ALPRAZolam, alum & mag hydroxide-simeth, diphenhydrAMINE, loratadine, magnesium hydroxide, ondansetron **OR** ondansetron (ZOFRAN) IV, oxyCODONE-acetaminophen, simethicone   Vital Signs    Vitals:   11/11/16 2055 11/12/16 0113 11/12/16 0517 11/12/16 0733  BP:   124/64 (!) 124/57  Pulse:   71 70  Resp:   18 16  Temp:   98.1 F (36.7 C) 98 F (36.7 C)  TempSrc:   Oral Oral  SpO2: 97% 96% 96% 97%  Weight:   198 lb 8 oz (90 kg)   Height:        Intake/Output Summary (Last 24 hours) at 11/12/16 0908 Last data filed at 11/12/16 0517  Gross per 24 hour  Intake              240 ml  Output             1550 ml  Net            -1310 ml   Filed Weights   11/10/16 0335 11/11/16 0513 11/12/16 0517  Weight: 198 lb 1.6 oz (89.9 kg) 198 lb 1.6 oz (89.9 kg) 198 lb 8 oz (90 kg)    Telemetry    Atrial fib with a CVR - Personally Reviewed  ECG   none  Physical Exam   GEN: pleasant elderly woman, No acute distress.     Neck: 6 cm JVD Cardiac: IRIRR, no murmurs, rubs, or gallops.  Respiratory: Clear to auscultation bilaterally. GI: Soft, nontender, non-distended  MS: No edema; No deformity. Neuro:  Nonfocal  Psych: Normal affect   Labs    Chemistry Recent Labs Lab 11/10/16 0333 11/11/16 0519 11/12/16 0431  NA 142 140 139  K 4.3 4.2 4.3  CL 100* 98* 98*  CO2 32 32 33*  GLUCOSE 108* 90 101*  BUN 64* 59* 58*  CREATININE 1.53* 1.48* 1.39*  CALCIUM 8.2* 8.2* 8.1*  GFRNONAA 31* 32* 35*  GFRAA 36* 38* 41*  ANIONGAP '10 10 8     '$ Hematology Recent Labs Lab 11/07/16 0504 11/08/16 0529 11/08/16 2004  11/10/16 0333 11/11/16 0519 11/12/16 0431  WBC 8.2 10.4  --   --   --   --   --   RBC 2.79* 2.64* 3.25*  --   --   --   --   HGB 7.9* 7.7*  --   < > 9.0* 9.2*  9.1*  HCT 24.5* 22.8*  --   --   --   --   --   MCV 87.7 86.4  --   --   --   --   --   MCH 28.5 29.2  --   --   --   --   --   MCHC 32.5 33.8  --   --   --   --   --   RDW 14.9* 14.8*  --   --   --   --   --   PLT 269 308  --   --   --   --   --   < > = values in this interval not displayed.  Cardiac EnzymesNo results for input(s): TROPONINI in the last 168 hours. No results for input(s): TROPIPOC in the last 168 hours.   BNPNo results for input(s): BNP, PROBNP in the last 168 hours.   DDimer No results for input(s): DDIMER in the last 168 hours.   Radiology    Bilateral infiltrates are present  Patient Profile     82 y.o. female with PAF, acute on chronic diastolic CHF and anemia  Assessment & Plan    1  Atrial fib - her ventricular rate is well controlled. Continue current meds.  2  Acute on chronic diastolic CHF - she has been difficult to diurese. I suspect her pulmonary HTN (PASP 70) is playing a role in her symptoms.   3  AS  Moderate - not surgical however. I do not think this is her main problem.  4 Anemia - her hgb is stable 5. Disposition - discussed with Dr. Leslye Peer. It is reasonable to continue more IV  lasix but I think her long term prognosis is poor. Consider palliative care consult for goals of care.  Signed, Cristopher Peru, MD  11/12/2016, 9:08 AM

## 2016-11-12 NOTE — Progress Notes (Signed)
Patient ID: Toni [name], female   DOB: 09-Jul-1935, 82 y.o.   MRN: [id number]    [facility] PROGRESS NOTE  Toni [name] VQM:[id number] DOB: 06-Jul-1935 DOA: 11/03/2016 PCP: Pcp Not In System  HPI/Subjective: Patient's major complaint is shortness of breath with exertion. Still having cough but nonproductive. Patient only feels slightly better since being here. As per husband, she walks about 50 feet and can be short of breath at home.  Objective: Vitals:   11/12/16 0733 11/12/16 1134  BP: (!) 124/57 113/62  Pulse: 70 76  Resp: 16 18  Temp: 98 F (36.7 C) 98 F (36.7 C)    Filed Weights   11/10/16 0335 11/11/16 0513 11/12/16 0517  Weight: 89.9 kg (198 lb 1.6 oz) 89.9 kg (198 lb 1.6 oz) 90 kg (198 lb 8 oz)    ROS: Review of Systems  Constitutional: Negative for chills and fever.  Eyes: Negative for blurred vision.  Respiratory: Positive for cough and shortness of breath.   Cardiovascular: Negative for chest pain.  Gastrointestinal: Negative for abdominal pain, constipation, diarrhea, nausea and vomiting.  Genitourinary: Negative for dysuria.  Musculoskeletal: Negative for joint pain.  Neurological: Negative for dizziness and headaches.   Exam: Physical Exam  Constitutional: She is oriented to person, place, and time.  HENT:  Nose: No mucosal edema.  Mouth/Throat: No oropharyngeal exudate or posterior oropharyngeal edema.  Eyes: Conjunctivae, EOM and lids are normal. Pupils are equal, round, and reactive to light.  Neck: No JVD present. Carotid bruit is not present. No edema present. No thyroid mass and no thyromegaly present.  Cardiovascular: S1 normal and S2 normal.  Exam reveals no gallop.   Murmur heard.  Systolic murmur is present with a grade of 4/6  Pulses:      Dorsalis pedis pulses are 2+ on the right side, and 2+ on the left side.  Respiratory: No respiratory distress. She has decreased breath sounds in the right lower field and the left lower field.  She has no wheezes. She has no rhonchi. She has no rales.  GI: Soft. Bowel sounds are normal. There is no tenderness.  Musculoskeletal:       Right ankle: She exhibits swelling.       Left ankle: She exhibits swelling.  Lymphadenopathy:    She has no cervical adenopathy.  Neurological: She is alert and oriented to person, place, and time. No cranial nerve deficit.  Skin: Skin is warm. No rash noted. Nails show no clubbing.  Psychiatric: She has a normal mood and affect.      Data Reviewed: Basic Metabolic Panel:  Recent Labs Lab 11/07/16 0504 11/08/16 0529 11/09/16 0313 11/10/16 0333 11/11/16 0519 11/12/16 0431  NA 139 137 138 142 140 139  K 3.9 4.2 4.1 4.3 4.2 4.3  CL 98* 99* 99* 100* 98* 98*  CO2 32 31 32 32 32 33*  GLUCOSE 118* 101* 126* 108* 90 101*  BUN 73* 71* 68* 64* 59* 58*  CREATININE 1.69* 1.65* 1.71* 1.53* 1.48* 1.39*  CALCIUM 8.4* 8.2* 8.1* 8.2* 8.2* 8.1*  MG 2.4  --   --   --   --   --    CBC:  Recent Labs Lab 11/07/16 0504 11/08/16 0529 11/09/16 0313 11/10/16 0333 11/11/16 0519 11/12/16 0431  WBC 8.2 10.4  --   --   --   --   HGB 7.9* 7.7* 8.9* 9.0* 9.2* 9.1*  HCT 24.5* 22.8*  --   --   --   --  MCV 87.7 86.4  --   --   --   --   PLT 269 308  --   --   --   --    BNP (last 3 results)  Recent Labs  11/03/16 1625  BNP 338.0*     Scheduled Meds: . sodium chloride   Intravenous Once  . budesonide (PULMICORT) nebulizer solution  0.5 mg Nebulization BID  . calcium carbonate  1 tablet Oral BID  . diltiazem  120 mg Oral Daily  . docusate sodium  100 mg Oral BID  . furosemide  60 mg Intravenous BID  . ipratropium-albuterol  3 mL Nebulization Q6H  . levothyroxine  75 mcg Oral QAC breakfast  . methylPREDNISolone (SOLU-MEDROL) injection  40 mg Intravenous Daily  . nystatin  5 mL Oral QID  . omega-3 acid ethyl esters  1 g Oral Daily  . pantoprazole  40 mg Oral BID  . polyethylene glycol  17 g Oral Daily  . potassium chloride  20 mEq Oral BID   . rOPINIRole  0.25 mg Oral QHS  . sodium chloride flush  3 mL Intravenous Q12H  . traZODone  50 mg Oral QHS    Assessment/Plan:  1. COPD exacerbation with pneumonia on repeat chest x-ray. Could also be a radiation pneumonitis. Continue Solu-Medrol. Patient finished Rocephin and Zithromax course. Continue nebulizer treatments.  Very slow to progress. 2. Acute diastolic congestive heart failure with moderate to severe aortic stenosis. Cardiology following. Continue Lasix '60mg'$  IV twice a day. Cardiology stopped flecainide. No beta blocker as per cardiology. Patient on diltiazem 3. Acute respiratory failure usually only wears oxygen at night. Patient desaturates in the 80s even with oxygen with very limited movement 4. Atrial fibrillation. On diltiazem. Holding Eliquis with drop in hemoglobin. Stopped flecainide with pauses. 5. Restless leg syndrome. Improved with Requip 6. Acute kidney injury on chronic kidney disease stage III. Watch closely on IV Lasix 7. Weakness. Physical therapy evaluation appreciated 8. Hypothyroidism unspecified on levothyroxine 9. GERD on Protonix 10. History of lung cancer and lung nodule 11. Anemia with slowly dropping hemoglobin. Anticoagulation on hold. Responded well to blood transfusion 12. Constipation- resolved  Code Status:     Code Status Orders        Start     Ordered   11/03/16 2040  Full code  Continuous     11/03/16 2039    Code Status History    Date Active Date Inactive Code Status Order ID Comments User Context   02/16/2016  9:27 PM 02/26/2016  2:33 PM Full Code [id number]  Lance Coon, MD Inpatient   11/16/2013 12:33 AM 11/24/2013  6:53 PM Full Code [id number]  Angelica Ran, MD Inpatient     Family Communication: Husband Today Disposition Plan: To be determined  Consultants:  Cardiology  Antibiotics:  Rocephin  Zithromax finished  Time spent: 37 minutes, including acp time  [place], [place]

## 2016-11-12 NOTE — Progress Notes (Signed)
Patient ID: Toni [name], female   DOB: 09/10/1935, 82 y.o.   MRN: [id number]  ACP Note  Patient and husband present for disscussion  Patient has not progressed much since being in the hospital.  She still is short of breath with minimal activity. Pulse ox drops with minimal exertion. I explained that I'm been trying to get the patient to feel better the entire time that she's been here and has not gotten better yet. The patient has some diagnosis that will not improve.  Patients last day of IV abx is today. She has been on steroids and IV lasix. Dx: pneumonia, possible radiation pneumonitis, pulmonary hypertension, Aortic stenosis with CHF.  Discussed CODE STATUS at length. Patient would like to be a full code.  We'll get palliative care consultation tomorrow morning.  Continue IV diuresis as per cardiology  Time spent on ACP discussion 17 minutes Dr. Loletha Grayer

## 2016-11-13 DIAGNOSIS — Z7189 Other specified counseling: Secondary | ICD-10-CM

## 2016-11-13 DIAGNOSIS — Z515 Encounter for palliative care: Secondary | ICD-10-CM

## 2016-11-13 MED ORDER — PREDNISONE 50 MG PO TABS
50.0000 mg | ORAL_TABLET | Freq: Every day | ORAL | Status: DC
  Administered 2016-11-14 – 2016-11-19 (×4): 50 mg via ORAL
  Filled 2016-11-13 (×5): qty 1

## 2016-11-13 NOTE — Progress Notes (Signed)
Patient ID: Toni [name], female   DOB: [birth year], 82 y.o.   MRN: [id number]    [facility] PROGRESS NOTE  Toni [name] [id number]:[id number] DOB: 1934-12-26 DOA: 11/03/2016 PCP: Pcp Not In System  HPI/Subjective: Patient's major complaint is shortness of breath with exertion. Reports her lower extent is swelling remains the same Still having cough but nonproductive..  Objective: Vitals:   11/13/16 0855 11/13/16 1152  BP: (!) 115/48 113/61  Pulse: 86 80  Resp: 18 12  Temp: 98 F (36.7 C) 98.1 F (36.7 C)    Filed Weights   11/11/16 0513 11/12/16 0517 11/13/16 0424  Weight: 89.9 kg (198 lb 1.6 oz) 90 kg (198 lb 8 oz) 90.1 kg (198 lb 11.2 oz)    ROS: Review of Systems  Constitutional: Negative for chills and fever.  Eyes: Negative for blurred vision.  Respiratory: Positive for cough. Negative for shortness of breath.   Cardiovascular: Negative for chest pain.  Gastrointestinal: Negative for abdominal pain, constipation, diarrhea, nausea and vomiting.  Genitourinary: Negative for dysuria.  Musculoskeletal: Negative for joint pain.  Neurological: Negative for dizziness and headaches.   Exam: Physical Exam  Constitutional: She is oriented to person, place, and time.  HENT:  Nose: No mucosal edema.  Mouth/Throat: No oropharyngeal exudate or posterior oropharyngeal edema.  Eyes: Conjunctivae, EOM and lids are normal. Pupils are equal, round, and reactive to light.  Neck: No JVD present. Carotid bruit is not present. No edema present. No thyroid mass and no thyromegaly present.  Cardiovascular: S1 normal and S2 normal.  Exam reveals no gallop.   Murmur heard.  Systolic murmur is present with a grade of 4/6  Pulses:      Dorsalis pedis pulses are 2+ on the right side, and 2+ on the left side.  Respiratory: No respiratory distress. She has decreased breath sounds in the right lower field and the left lower field. She has no wheezes. She has no rhonchi. She has no rales.  GI:  Soft. Bowel sounds are normal. There is no tenderness.  Musculoskeletal:       Right ankle: She exhibits swelling.       Left ankle: She exhibits swelling.  Lymphadenopathy:    She has no cervical adenopathy.  Neurological: She is alert and oriented to person, place, and time. No cranial nerve deficit.  Skin: Skin is warm. No rash noted. Nails show no clubbing.  Psychiatric: She has a normal mood and affect.      Data Reviewed: Basic Metabolic Panel:  Recent Labs Lab 11/07/16 0504 11/08/16 0529 11/09/16 0313 11/10/16 0333 11/11/16 0519 11/12/16 0431  NA 139 137 138 142 140 139  K 3.9 4.2 4.1 4.3 4.2 4.3  CL 98* 99* 99* 100* 98* 98*  CO2 32 31 32 32 32 33*  GLUCOSE 118* 101* 126* 108* 90 101*  BUN 73* 71* 68* 64* 59* 58*  CREATININE 1.69* 1.65* 1.71* 1.53* 1.48* 1.39*  CALCIUM 8.4* 8.2* 8.1* 8.2* 8.2* 8.1*  MG 2.4  --   --   --   --   --    CBC:  Recent Labs Lab 11/07/16 0504 11/08/16 0529 11/09/16 0313 11/10/16 0333 11/11/16 0519 11/12/16 0431  WBC 8.2 10.4  --   --   --   --   HGB 7.9* 7.7* 8.9* 9.0* 9.2* 9.1*  HCT 24.5* 22.8*  --   --   --   --   MCV 87.7 86.4  --   --   --   --  PLT 269 308  --   --   --   --    BNP (last 3 results)  Recent Labs  11/03/16 1625  BNP 338.0*     Scheduled Meds: . sodium chloride   Intravenous Once  . budesonide (PULMICORT) nebulizer solution  0.5 mg Nebulization BID  . calcium carbonate  1 tablet Oral BID  . diltiazem  120 mg Oral Daily  . docusate sodium  100 mg Oral BID  . furosemide  60 mg Intravenous BID  . ipratropium-albuterol  3 mL Nebulization Q6H  . levothyroxine  75 mcg Oral QAC breakfast  . methylPREDNISolone (SOLU-MEDROL) injection  40 mg Intravenous Daily  . nystatin  5 mL Oral QID  . omega-3 acid ethyl esters  1 g Oral Daily  . pantoprazole  40 mg Oral BID  . polyethylene glycol  17 g Oral Daily  . potassium chloride  20 mEq Oral BID  . rOPINIRole  0.25 mg Oral QHS  . sodium chloride flush  3  mL Intravenous Q12H  . traZODone  50 mg Oral QHS    Assessment/Plan:  1. COPD exacerbation with pneumonia on repeat chest x-ray. Could also be a radiation pneumonitis. Continue Solu-Medrol 40 mg IV daily and change it to by mouth prednisone from tomorrow Patient finished Rocephin and Zithromax course. Continue nebulizer treatments.  Very slow to progress. Consult pulmonology if needed 2. Acute diastolic congestive heart failure with moderate to severe aortic stenosis. Cardiology following. Continue Lasix '60mg'$  IV twice a day. Cardiology stopped flecainide. No beta blocker as per cardiology. Patient on diltiazem 3. AcuteOn chronic respiratory failure secondary to acute on chronic CHF, COPD exacerbation and chronic pulmonary hypertension as well as anemia  usually only wears oxygen at night. Patient desaturates in the 80s even with oxygen with very limited movement 4. Atrial fibrillation. On 120 mg diltiazem. Holding Eliquis with drop in hemoglobin. Status post 1 unit of blood transfusion. Monitor hemoglobin closely Stopped flecainide with pauses. 5. Restless leg syndrome. Improved with Requip 6. Acute kidney injury on chronic kidney disease stage III. Watch closely on IV Lasix. Creatinine at 1.5 pre--1.48--1.39 today 7. Weakness. Physical therapy evaluation  8. Hypothyroidism unspecified on levothyroxine 9. GERD on Protonix 10. History of lung cancer and lung nodule 11. Anemia with slowly dropping hemoglobin. Anticoagulation on hold. Responded well to blood transfusion. Hemoglobin at 9.1  12. Constipation- resolved  Code Status:     Code Status Orders        Start     Ordered   11/03/16 2040  Full code  Continuous     11/03/16 2039    Code Status History    Date Active Date Inactive Code Status Order ID Comments User Context   02/16/2016  9:27 PM 02/26/2016  2:33 PM Full Code [id number]  Lance Coon, MD Inpatient   11/16/2013 12:33 AM 11/24/2013  6:53 PM Full Code [id number]  Angelica Ran, MD Inpatient     Family Communication: Husband Today Disposition Plan: To be determined  Consultants:  Cardiology  Antibiotics:  Rocephin  Zithromax finished  Time spent: 33 minutes Toni [name], [place]

## 2016-11-13 NOTE — Consult Note (Signed)
Consultation Note Date: 11/13/2016   Patient Name: Toni [name]  DOB: 1935-02-21  MRN: [id number]  Age / Sex: 82 y.o., female  PCP: Pcp Not In System Referring Physician: Nicholes Mango, MD  Reason for Consultation: Establishing goals of care  HPI/Patient Profile: 82 y.o. female  with past medical history of paroxysmal a fib. (on eliquis), CHF, COPD (on home O2 at night), HTN, hypothyroidism, CKD stage II admitted on 11/03/2016 with increasing SOB.  Workup revealed CHF exacerbation, COPD exacerbation, fluid overload. Admitted for diuresis. Palliative consulted for [place].  Clinical Assessment and Goals of Care: Met with patient and her husband at bedside. Patient was sitting up in her chair. Alert and oriented. Pleasant. 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 She does note she occasionally gets SOB, but otherwise has no complaints, except she tells me she is ready to go home. Eating and sleeping well, no complaints. SOB is easily relieved with O2 and rest. We reviewed the trajectory of CHF and COPD, noting that each exacerbation may leave her a little worse than she was prior. Her main goals are to continue working in her [place] and to be able to communicate with her family.  She does not have advance directives and has never considered them. She hasn't thought about dying. She would want her husband to be her decision maker if she were incapacitated. He agrees with this. She would not want to be kept alive artificially for a long time on a ventilator but "for a little while is ok".  We discussed code status- she is aware she has major illnesses that are unreversible and that we are mainly treating symptoms. She had not considered DNR status before and wasn't aware that was medically recommended for someone  with these advanced illnesses. She was receptive to information. She would like to discuss information with her family.    Primary Decision Maker NEXT OF KIN - spouse    SUMMARY OF RECOMMENDATIONS -Continue current level of care -Will recommend palliative f/u at home when meet with her again tomorrow to f/u on [place]:  Full code   Prognosis:    Unable to determine  Discharge Planning: To Be Determined  Primary Diagnoses: Present on Admission: . Acute on chronic diastolic heart failure ([place])   I have reviewed the medical record, interviewed the patient and family, and examined the patient. The following aspects are pertinent.  Past Medical History:  Diagnosis Date  . Atypical atrial flutter ([place]) 11/16/2013   a. CHA2DS2VASc = 5-->prev on xarelto, d/c'd due to bleeding skin tear;  c. 11/2013->Placed on flecainide.  . Cataract   . Chronic diastolic CHF (congestive heart failure) ([place])    a. 11/2013 Echo: EF 55-60%, mild AS/MR, mod dil LA/RA, PASP 10mHg.  .Marland KitchenChronic diastolic heart failure ([place]   . Collagen vascular disease ([place]   . COPD (  chronic obstructive pulmonary disease) ([place])   . Essential hypertension, benign   . Hypokalemia   . Hypothyroidism   . 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 Lung cancer ([place])   . Lung nodule    Followed by Dr. Raul Del  . PAF (paroxysmal atrial fibrillation) ([place])    a. 11/2013 Echo: EF 55-60%, mild AS/MR, mod dil LA/RA, PASP 35mHg;  b. CHA2DS2VASc = 5-->prev on xarelto, d/c'd due to bleeding skin tear;  c. 11/2013->Placed on flecainide.   Social History   Social History  . Marital status: Married    Spouse name: N/A  . Number of children: N/A  . Years of education: N/A   Social History Main Topics  . Smoking status: Former Smoker    Packs/day: 1.50    Years: 50.00    Types: Cigarettes    Quit date: 09/18/2002  . Smokeless tobacco: Never Used  . Alcohol use No  . Drug  use: No  . Sexual activity: Not Asked   Other Topics Concern  . None   Social History Narrative   Lives at home with husband. Independent at baseline.   Family History  Problem Relation Age of Onset  . CAD Father   . Heart disease Father   . Heart attack Father   . Arrhythmia Sister    Scheduled Meds: . sodium chloride   Intravenous Once  . budesonide (PULMICORT) nebulizer solution  0.5 mg Nebulization BID  . calcium carbonate  1 tablet Oral BID  . diltiazem  120 mg Oral Daily  . docusate sodium  100 mg Oral BID  . furosemide  60 mg Intravenous BID  . ipratropium-albuterol  3 mL Nebulization Q6H  . levothyroxine  75 mcg Oral QAC breakfast  . nystatin  5 mL Oral QID  . omega-3 acid ethyl esters  1 g Oral Daily  . pantoprazole  40 mg Oral BID  . polyethylene glycol  17 g Oral Daily  . potassium chloride  20 mEq Oral BID  . [START ON 11/14/2016] predniSONE  50 mg Oral Q breakfast  . rOPINIRole  0.25 mg Oral QHS  . sodium chloride flush  3 mL Intravenous Q12H  . traZODone  50 mg Oral QHS   Continuous Infusions: PRN Meds:.acetaminophen **OR** acetaminophen, ALPRAZolam, alum & mag hydroxide-simeth, diphenhydrAMINE, loratadine, magnesium hydroxide, ondansetron **OR** ondansetron (ZOFRAN) IV, oxyCODONE-acetaminophen, simethicone Medications Prior to Admission:  Prior to Admission medications   Medication Sig Start Date End Date Taking? Authorizing Provider  albuterol (PROVENTIL HFA) 108 (90 Base) MCG/ACT inhaler Inhale 2 puffs into the lungs every 4 (four) hours as needed for wheezing or shortness of breath. 07/23/16  Yes PCarrie Mew MD  apixaban (ELIQUIS) 5 MG TABS tablet Take 1 tablet (5 mg total) by mouth 2 (two) times daily. 08/03/16  Yes TMinna Merritts MD  budesonide (PULMICORT) 0.5 MG/2ML nebulizer solution Take 2 mLs (0.5 mg total) by nebulization 2 (two) times daily. 06/08/15  Yes KFlora Lipps MD  cyanocobalamin (,VITAMIN B-12,) 1000 MCG/ML injection Vit B12 1000 mcg  IM once a week for 4 weeks then once a month for 4 months 10/09/16  Yes Historical Provider, MD  diltiazem (CARDIZEM CD) 300 MG 24 hr capsule Take 300 mg by mouth daily.   Yes Historical Provider, MD  flecainide (TAMBOCOR) 100 MG tablet Take 1 tablet (100 mg total) by mouth 2 (two) times daily. 11/24/13  Yes EFrazier Richards MD  formoterol (PERFOROMIST) 20 MCG/2ML nebulizer solution  Take 2 mLs (20 mcg total) by nebulization 2 (two) times daily. 06/08/15  Yes Flora Lipps, MD  furosemide (LASIX) 80 MG tablet Take 1 tablet (80 mg total) by mouth 2 (two) times daily. Patient taking differently: Take 80 mg by mouth daily. Take 160 mg on Monday, Wednesday and Friday. Take 80 mg on Tuesday, Thursday, Saturday and Sunday. 08/03/16  Yes Timothy J Gollan, MD  ipratropium-albuterol (DUONEB) 0.5-2.5 (3) MG/3ML SOLN Take 3 mLs by nebulization every 4 (four) hours as needed (for wheezing/shortness of breath). DX Code: J44.9   DX: COPD 08/29/16  Yes David B Simonds, MD  levothyroxine (SYNTHROID, LEVOTHROID) 75 MCG tablet Take 75 mcg by mouth daily before breakfast.   Yes Historical Provider, MD  loratadine (CLARITIN) 10 MG tablet Take 10 mg by mouth daily.   Yes Historical Provider, MD  omega-3 acid ethyl esters (LOVAZA) 1 g capsule Take 1 g by mouth daily.   Yes Historical Provider, MD  omeprazole (PRILOSEC) 20 MG capsule Take 20 mg by mouth 2 (two) times daily before a meal.   Yes Historical Provider, MD  potassium chloride (K-DUR,KLOR-CON) 10 MEQ tablet Take 2 tablets (20 mEq total) by mouth 2 (two) times daily. 08/03/16  Yes Timothy J Gollan, MD   Allergies  Allergen Reactions  . Belladonna Alkaloids Anaphylaxis and Other (See Comments)    Pt states that this medication makes her eyes cross.    . Dabigatran Other (See Comments)   Review of Systems  Constitutional: Positive for activity change and fatigue.  Respiratory: Positive for chest tightness and shortness of breath.   All other systems reviewed and are  negative.   Physical Exam  Constitutional: She appears well-developed and well-nourished. No distress.  Cardiovascular: Normal rate and regular rhythm.   Pulmonary/Chest: Effort normal. No respiratory distress.    Vital Signs: BP 113/61 (BP Location: Left Arm)   Pulse 80   Temp 98.1 F (36.7 C) (Oral)   Resp 12   Ht 5' 6" (1.676 m)   Wt 90.1 kg (198 lb 11.2 oz)   SpO2 94%   BMI 32.07 kg/m  Pain Assessment: No/denies pain   Pain Score: Asleep   SpO2: SpO2: 94 % O2 Device:SpO2: 94 % O2 Flow Rate: .O2 Flow Rate (L/min): 2 L/min  IO: Intake/output summary:  Intake/Output Summary (Last 24 hours) at 11/13/16 1501 Last data filed at 11/13/16 1354  Gross per 24 hour  Intake              840 ml  Output              100 ml  Net              74 0 ml    LBM: Last BM Date: 11/12/16 Baseline Weight: Weight: 81.6 kg (180 lb) Most recent weight: Weight: 90.1 kg (198 lb 11.2 oz)     Palliative Assessment/Data: PPS: 80%   Flowsheet Rows   Flowsheet Row Most Recent Value  Intake Tab  Referral Department  Hospitalist  Unit at Time of Referral  Cardiac/Telemetry Unit  Palliative Care Primary Diagnosis  Pulmonary  Date Notified  11/12/16  Palliative Care Type  New Palliative care  Reason for referral  Clarify Goals of Care  Date of Admission  11/03/16  # of days IP prior to Palliative referral  9  Clinical Assessment  Psychosocial & Spiritual Assessment  Palliative Care Outcomes      Thank you for this consult. Palliative medicine will continue  to follow and assist as needed.   Time Total: 70 minutes   Greater than 50%  of this time was spent counseling and coordinating care related to the above assessment and plan.  Signed by: Mariana Kaufman, AGNP-C Palliative Medicine    Please contact Palliative Medicine Team phone at [phone] for questions and concerns.  For individual provider: See Shea Evans

## 2016-11-13 NOTE — Discharge Instructions (Signed)
Heart Failure Clinic appointment on November 23, 2016 at 9:00am with Darylene Price, [place]. Please call [phone] to reschedule.

## 2016-11-13 NOTE — Progress Notes (Signed)
PT Cancellation Note  Patient Details Name: Toni [name] MRN: [id number] DOB: [birth year]   Cancelled Treatment:    Reason Eval/Treat Not Completed: Other (comment). Treatment attempted, however pt currently receiving bath. Will re-attempt at another time.   [name] 11/13/2016, 10:58 AM  Greggory Stallion, PT, DPT [phone]

## 2016-11-13 NOTE — Progress Notes (Signed)
Due to patient remaining in the hospital, have rescheduled her initial appointment at the HF Clinic to November 23, 2016 at 9:00am. Thank you.

## 2016-11-13 NOTE — Care Management (Signed)
Barrier to discharge- heart rate continued need for IV diuresis, IV steroids.  Updated [facility]

## 2016-11-13 NOTE — Progress Notes (Signed)
Patient remains alert and oriented, sitting in the chair at this time, family at bedside, denies any pain or discomfort, vss mood calm and pleasant. Palliative care consult completed

## 2016-11-13 NOTE — Progress Notes (Signed)
Patient Name: Toni [name] Date of Encounter: 11/13/2016  Primary Cardiologist: [facility] Problem List     Active Problems:   Acute on chronic diastolic heart failure (HCC)   Anemia in chronic kidney disease   Cough   PAF (paroxysmal atrial fibrillation) (HCC)   Congestive heart failure (HCC)     Subjective   Breathing remains about the same. Continues on 2 L via nasal cannula. Weight up 8 pounds for the admission. Has ambulated some in her room. Net -7 L for the admission. Renal function and hgb stable. Palliative care to see today for goals of care.   Inpatient Medications    Scheduled Meds: . sodium chloride   Intravenous Once  . budesonide (PULMICORT) nebulizer solution  0.5 mg Nebulization BID  . calcium carbonate  1 tablet Oral BID  . diltiazem  120 mg Oral Daily  . docusate sodium  100 mg Oral BID  . furosemide  60 mg Intravenous BID  . ipratropium-albuterol  3 mL Nebulization Q6H  . levothyroxine  75 mcg Oral QAC breakfast  . methylPREDNISolone (SOLU-MEDROL) injection  40 mg Intravenous Daily  . nystatin  5 mL Oral QID  . omega-3 acid ethyl esters  1 g Oral Daily  . pantoprazole  40 mg Oral BID  . polyethylene glycol  17 g Oral Daily  . potassium chloride  20 mEq Oral BID  . rOPINIRole  0.25 mg Oral QHS  . sodium chloride flush  3 mL Intravenous Q12H  . traZODone  50 mg Oral QHS   Continuous Infusions:  PRN Meds: acetaminophen **OR** acetaminophen, ALPRAZolam, alum & mag hydroxide-simeth, diphenhydrAMINE, loratadine, magnesium hydroxide, ondansetron **OR** ondansetron (ZOFRAN) IV, oxyCODONE-acetaminophen, simethicone   Vital Signs    Vitals:   11/12/16 2025 11/13/16 0241 11/13/16 0424 11/13/16 0724  BP: (!) 108/53  126/76   Pulse: 80  87   Resp: 18  18   Temp: 98.1 F (36.7 C)  97.9 F (36.6 C)   TempSrc: Oral  Oral   SpO2: 95% 98% 97% 97%  Weight:   198 lb 11.2 oz (90.1 kg)   Height:        Intake/Output Summary (Last 24 hours) at  11/13/16 0814 Last data filed at 11/13/16 0801  Gross per 24 hour  Intake              720 ml  Output              475 ml  Net              245 ml   Filed Weights   11/11/16 0513 11/12/16 0517 11/13/16 0424  Weight: 198 lb 1.6 oz (89.9 kg) 198 lb 8 oz (90 kg) 198 lb 11.2 oz (90.1 kg)    Physical Exam    GEN: Well nourished, well developed, in no acute distress.  HEENT: Grossly normal.  Neck: Supple, JVD ~ 6 cm, no carotid bruits, or masses. Cardiac: Irregularly irregular, II/VI systolic murmur, no rubs, or gallops. No clubbing, cyanosis. Trace pre-tibial edema.  Radials/DP/PT 2+ and equal bilaterally.   Respiratory:  Faint crackles left base. GI: Soft, nontender, nondistended, BS + x 4. MS: no deformity or atrophy. Skin: warm and dry, no rash. Neuro:  Strength and sensation are intact. Psych: AAOx3.  Normal affect.  Labs    CBC  Recent Labs  11/11/16 0519 11/12/16 0431  HGB 9.2* 9.1*   Basic Metabolic Panel  Recent Labs  11/11/16  3716 11/12/16 0431  NA 140 139  K 4.2 4.3  CL 98* 98*  CO2 32 33*  GLUCOSE 90 101*  BUN 59* 58*  CREATININE 1.48* 1.39*  CALCIUM 8.2* 8.1*   Liver Function Tests No results for input(s): AST, ALT, ALKPHOS, BILITOT, PROT, ALBUMIN in the last 72 hours. No results for input(s): LIPASE, AMYLASE in the last 72 hours. Cardiac Enzymes No results for input(s): CKTOTAL, CKMB, CKMBINDEX, TROPONINI in the last 72 hours. BNP Invalid input(s): POCBNP D-Dimer No results for input(s): DDIMER in the last 72 hours. Hemoglobin A1C No results for input(s): HGBA1C in the last 72 hours. Fasting Lipid Panel No results for input(s): CHOL, HDL, LDLCALC, TRIG, CHOLHDL, LDLDIRECT in the last 72 hours. Thyroid Function Tests No results for input(s): TSH, T4TOTAL, T3FREE, THYROIDAB in the last 72 hours.  Invalid input(s): FREET3  Telemetry    Afib, 80's bpm, occasional PVC - Personally Reviewed  ECG    n/a - Personally Reviewed  Radiology      Dg Chest 2 View  Result Date: 11/12/2016 CLINICAL DATA:  Nonproductive cough and shortness of breath with exertion. History of lung cancer. Patient admitted 11/03/2016 for chest pressure and shortness of breath. EXAM: CHEST  2 VIEW COMPARISON:  Single-view of the chest 11/06/2016 and 11/03/2016. FINDINGS: Bilateral airspace disease, worst in the left mid and upper lung zones, has improved. No pneumothorax is identified. Heart size enlarged. There are likely trace bilateral pleural effusions. IMPRESSION: Improved airspace disease, particularly in the left upper lobe. No new abnormality. Emphysema. Cardiomegaly. Atherosclerosis. Hiatal hernia. Electronically Signed   By: Inge Rise M.D.   On: 11/12/2016 11:12    Cardiac Studies   TTE 11/06/16: Study Conclusions  - Left ventricle: The cavity size was normal. There was mild concentric hypertrophy. Systolic function was normal. The estimated ejection fraction was in the range of 55% to 60%. Wall motion was normal; there were no regional wall motion abnormalities. Features are consistent with a pseudonormal left ventricular filling pattern, with concomitant abnormal relaxation and increased filling pressure (grade 2 diastolic dysfunction). - Aortic valve: There was moderate to severe stenosis. Mean gradient (S): 30 mm Hg. Valve area (VTI): 0.98 cm^2. Valve area (Vmax): 1.05 cm^2. Valve area (Vmean): 1.01 cm^2. - Mitral valve: Severely calcified annulus. There was moderate regurgitation. - Left atrium: The atrium was severely dilated. - Right atrium: The atrium was moderately dilated. - Tricuspid valve: There was moderate regurgitation. - Pulmonary arteries: Systolic pressure was severely increased. PA peak pressure: 70 mm Hg (S).  Patient Profile     82 y.o. female with history of paroxysmal atrial flutter on eliquis, chronic diastolic CHF, COPD on nocturnal home oxygen, hypertension, hypothyroidism, CKD stage II  presentedto hospital secondary to worsening shortness of breath.  Assessment & Plan    1. Acute on chronic respiratory failure: -Overall a difficult situation  -Likely multifactorial including acute on chronic diastolic CHF/pulmonary hypertension, anemia requiring one unit of pRBC, and valvular heart disease -Wants to go home -Needs to ambulate  2. Acute on chronic diastolic CHF: -Diuresis has been difficult -Continue IV Lasix 60 mg bid today -Consider RHC if she remains inpatient to further assess her right-heart pressure after multiple days of diuresis  -Not on beta blocker given pulmonary status   3. Persistent Afib: -Rate controlled -Continue Cardizem 120 mg daily -Not on beta blocker as above -Eliquis was stopped earlier this admission 2/2 down trending hgb -Uncertain she would be a candidate for resuming of Eliquis  upon discharge -CHADS2VASc at least 6 (CHF, HTN, age x 2, vascular disease, female)  4. Anemia: -HGB stable currently -Status post 1 unit pRBC this admission -Per IM  5. AS: -Moderate -Will need outpatient follow up -Certain at this time if she would be a surgical candidate down the road   6. Dispo: -Palliative care to see today to discuss goals of care  Signed, Marcille Blanco [place] Pager: [phone] 11/13/2016, 8:14 AM

## 2016-11-13 NOTE — Progress Notes (Signed)
Nutrition Brief Note  Patient identified for length of stay >10 days  Wt Readings from Last 15 Encounters:  11/13/16 198 lb 11.2 oz (90.1 kg)  10/10/16 191 lb 5.8 oz (86.8 kg)  09/15/16 192 lb 8 oz (87.3 kg)  08/15/16 189 lb (85.7 kg)  08/03/16 194 lb 8 oz (88.2 kg)  08/01/16 194 lb (88 kg)  07/23/16 175 lb (79.4 kg)  06/02/16 185 lb 6.4 oz (84.1 kg)  05/03/16 191 lb (86.6 kg)  02/26/16 187 lb 6.4 oz (85 kg)  02/16/16 183 lb (83 kg)  01/25/16 177 lb (80.3 kg)  12/21/15 171 lb (77.6 kg)  12/16/15 171 lb (77.6 kg)  11/17/15 173 lb (78.5 kg)    Body mass index is 32.07 kg/m. Patient meets criteria for obese based on current BMI.   Current diet order is heart healthy, patient is consuming approximately 100% of meals at this time. Labs and medications reviewed.   No nutrition interventions warranted at this time. If nutrition issues arise, please consult RD.   Satira Anis. Tykel Badie, MS, RD LDN Inpatient Clinical Dietitian Pager [phone]

## 2016-11-14 ENCOUNTER — Ambulatory Visit: Payer: Medicare Other | Admitting: Family

## 2016-11-14 LAB — CBC
HCT: 30.9 % — ABNORMAL LOW (ref 35.0–47.0)
Hemoglobin: 10.1 g/dL — ABNORMAL LOW (ref 12.0–16.0)
MCH: 28.5 pg (ref 26.0–34.0)
MCHC: 32.9 g/dL (ref 32.0–36.0)
MCV: 86.8 fL (ref 80.0–100.0)
PLATELETS: 384 10*3/uL (ref 150–440)
RBC: 3.56 MIL/uL — AB (ref 3.80–5.20)
RDW: 16.2 % — ABNORMAL HIGH (ref 11.5–14.5)
WBC: 11.5 10*3/uL — ABNORMAL HIGH (ref 3.6–11.0)

## 2016-11-14 LAB — BASIC METABOLIC PANEL
Anion gap: 10 (ref 5–15)
BUN: 55 mg/dL — AB (ref 6–20)
CO2: 33 mmol/L — ABNORMAL HIGH (ref 22–32)
CREATININE: 1.28 mg/dL — AB (ref 0.44–1.00)
Calcium: 8.3 mg/dL — ABNORMAL LOW (ref 8.9–10.3)
Chloride: 95 mmol/L — ABNORMAL LOW (ref 101–111)
GFR, EST AFRICAN AMERICAN: 45 mL/min — AB (ref >60)
GFR, EST NON AFRICAN AMERICAN: 39 mL/min — AB (ref >60)
Glucose, Bld: 109 mg/dL — ABNORMAL HIGH (ref 65–99)
POTASSIUM: 4.3 mmol/L (ref 3.5–5.1)
SODIUM: 138 mmol/L (ref 135–145)

## 2016-11-14 MED ORDER — FUROSEMIDE 10 MG/ML IJ SOLN: 80.0000 mg | Freq: Two times a day (BID) | INTRAMUSCULAR | Status: DC

## 2016-11-14 MED ORDER — FUROSEMIDE 10 MG/ML IJ SOLN
10.0000 mg/h | INTRAVENOUS | Status: AC
  Administered 2016-11-14: 10 mg/h via INTRAVENOUS
  Filled 2016-11-14: qty 25

## 2016-11-14 NOTE — Progress Notes (Signed)
Physical Therapy Treatment Patient Details Name: Toni [name] MRN: [id number] DOB: [birth year] Today's Date: 11/14/2016    History of Present Illness presented to ER secondary to worsening SOB; admitted with acute/chronic CHF, COPD.  Currently on 2L supplemental O2 via [place]    PT Comments    Pt is making good progress towards goals and is very motivated to hopefully go home this date. Improved O2 sats with exertion with mobility performed on 3L of O2. Slight improvement in 5 time sit<>Stand testing, still with SOB symptoms with exertion demonstrating poor power in B LEs. Will continue to progress.   Follow Up Recommendations  Home health PT     Equipment Recommendations  Rolling walker with 5" wheels    Recommendations for Other Services       Precautions / Restrictions Precautions Precautions: Fall Restrictions Weight Bearing Restrictions: No    Mobility  Bed Mobility               General bed mobility comments: not performed as pt received in recliner  Transfers Overall transfer level: Modified independent Equipment used: Rolling walker (2 wheeled) Transfers: Sit to/from Stand Sit to Stand: Modified independent (Device/Increase time)         General transfer comment: safe technique with RW used. 3L of O2 used for mobility  Ambulation/Gait Ambulation/Gait assistance: Supervision Ambulation Distance (Feet): 80 Feet Assistive device: Rolling walker (2 wheeled) Gait Pattern/deviations: Step-through pattern     General Gait Details: improved gait speed with cues for pursed lip breathing during ambulation. Correct use of RW demonstrated. Decreased SOB symptoms noted this session as sats remained at 98% pre/post exertion.   Stairs            Wheelchair Mobility    Modified Rankin (Stroke Patients Only)       Balance                                    Cognition Arousal/Alertness: Awake/alert Behavior During Therapy: WFL for  tasks assessed/performed Overall Cognitive Status: Within Functional Limits for tasks assessed                      Exercises Other Exercises Other Exercises: Pt assisted for bathroom tasks, needed cga for hygiene and donning underwear. Safe technique performed with hygiene.  Other Exercises: Pt performed 5 time sit<>stand test in 26 seconds this date using hands for push off. Pt with SOB symptoms noted with exertion.    General Comments        Pertinent Vitals/Pain Pain Assessment: No/denies pain    Home Living                      Prior Function            PT Goals (current goals can now be found in the care plan section) Acute Rehab PT Goals Patient Stated Goal: to return home and have therapy there PT Goal Formulation: With patient Time For Goal Achievement: 11/18/16 Potential to Achieve Goals: Good Progress towards PT goals: Progressing toward goals    Frequency    Min 2X/week      PT Plan Current plan remains appropriate    Co-evaluation             End of Session Equipment Utilized During Treatment: Gait belt;Oxygen Activity Tolerance: Patient tolerated treatment well;Patient limited by fatigue Patient left: in  chair;with call bell/phone within reach;with chair alarm set;with family/visitor present Nurse Communication: Mobility status PT Visit Diagnosis: Unsteadiness on feet (R26.81);Muscle weakness (generalized) (M62.81)     Time: 7897-8478 PT Time Calculation (min) (ACUTE ONLY): 19 min  Charges:  $Gait Training: 8-22 mins                    G Codes:       Kamoni Depree 2016/11/16, 10:29 AM  Greggory Stallion, PT, DPT [phone]

## 2016-11-14 NOTE — Care Management Important Message (Signed)
Important Message  Patient Details  Name: Toni [name] MRN: [id number] Date of Birth: [birth year]   Medicare Important Message Given:  Yes    Katrina Stack, RN 11/14/2016, 1:10 PM

## 2016-11-14 NOTE — Progress Notes (Signed)
Patient Name: Toni [name] Date of Encounter: 11/14/2016  Primary Cardiologist: [facility] Problem List     Active Problems:   Acute on chronic diastolic heart failure (HCC)   Anemia in chronic kidney disease   Cough   PAF (paroxysmal atrial fibrillation) (HCC)   Congestive heart failure (HCC)   Goals of care, counseling/discussion   Palliative care by specialist     Subjective   SOB about the same. Remains in nasal cannula at 2 L. Weight has remained documented at 198 pounds for the past 5 days. Has ambulated in her room. HGB continues to improve, up to 10.1 this morning. Renal function continues to improve with IV diuresis. Net - 6.7 L.   Inpatient Medications    Scheduled Meds: . sodium chloride   Intravenous Once  . budesonide (PULMICORT) nebulizer solution  0.5 mg Nebulization BID  . calcium carbonate  1 tablet Oral BID  . diltiazem  120 mg Oral Daily  . docusate sodium  100 mg Oral BID  . furosemide  80 mg Intravenous BID  . ipratropium-albuterol  3 mL Nebulization Q6H  . levothyroxine  75 mcg Oral QAC breakfast  . nystatin  5 mL Oral QID  . omega-3 acid ethyl esters  1 g Oral Daily  . pantoprazole  40 mg Oral BID  . polyethylene glycol  17 g Oral Daily  . potassium chloride  20 mEq Oral BID  . predniSONE  50 mg Oral Q breakfast  . rOPINIRole  0.25 mg Oral QHS  . sodium chloride flush  3 mL Intravenous Q12H  . traZODone  50 mg Oral QHS   Continuous Infusions:  PRN Meds: acetaminophen **OR** acetaminophen, ALPRAZolam, alum & mag hydroxide-simeth, diphenhydrAMINE, loratadine, magnesium hydroxide, ondansetron **OR** ondansetron (ZOFRAN) IV, oxyCODONE-acetaminophen, simethicone   Vital Signs    Vitals:   11/13/16 1955 11/14/16 0211 11/14/16 0443 11/14/16 0752  BP:   120/64 (!) 143/86  Pulse:   77 82  Resp:   18 18  Temp:   97.6 F (36.4 C) 97.7 F (36.5 C)  TempSrc:   Oral Oral  SpO2: 97% 97% 100% 100%  Weight:   198 lb 1.6 oz (89.9 kg)     Height:        Intake/Output Summary (Last 24 hours) at 11/14/16 1011 Last data filed at 11/14/16 0443  Gross per 24 hour  Intake              240 ml  Output              300 ml  Net              -60 ml   Filed Weights   11/12/16 0517 11/13/16 0424 11/14/16 0443  Weight: 198 lb 8 oz (90 kg) 198 lb 11.2 oz (90.1 kg) 198 lb 1.6 oz (89.9 kg)    Physical Exam    GEN: Well nourished, well developed, in no acute distress.  HEENT: Grossly normal.  Neck: Supple, JVD elevated ~ 6 cm, no carotid bruits, or masses. Cardiac: Irregularly irregular, II/VI systolic murmur, no rubs, or gallops. No clubbing, cyanosis. Trace pre-tibial edema.  Radials/DP/PT 2+ and equal bilaterally.  Respiratory:  Respirations regular and unlabored, clear to auscultation bilaterally. GI: Soft, nontender, nondistended, BS + x 4. MS: no deformity or atrophy. Skin: warm and dry, no rash. Neuro:  Strength and sensation are intact. Psych: AAOx3.  Normal affect.  Labs    CBC  Recent  Labs  11/12/16 0431 11/14/16 0308  WBC  --  11.5*  HGB 9.1* 10.1*  HCT  --  30.9*  MCV  --  86.8  PLT  --  517   Basic Metabolic Panel  Recent Labs  11/12/16 0431 11/14/16 0308  NA 139 138  K 4.3 4.3  CL 98* 95*  CO2 33* 33*  GLUCOSE 101* 109*  BUN 58* 55*  CREATININE 1.39* 1.28*  CALCIUM 8.1* 8.3*   Liver Function Tests No results for input(s): AST, ALT, ALKPHOS, BILITOT, PROT, ALBUMIN in the last 72 hours. No results for input(s): LIPASE, AMYLASE in the last 72 hours. Cardiac Enzymes No results for input(s): CKTOTAL, CKMB, CKMBINDEX, TROPONINI in the last 72 hours. BNP Invalid input(s): POCBNP D-Dimer No results for input(s): DDIMER in the last 72 hours. Hemoglobin A1C No results for input(s): HGBA1C in the last 72 hours. Fasting Lipid Panel No results for input(s): CHOL, HDL, LDLCALC, TRIG, CHOLHDL, LDLDIRECT in the last 72 hours. Thyroid Function Tests No results for input(s): TSH, T4TOTAL, T3FREE,  THYROIDAB in the last 72 hours.  Invalid input(s): FREET3  Telemetry    Afib, 80's bpm - Personally Reviewed  ECG    n/a - Personally Reviewed  Radiology    No results found.  Cardiac Studies   TTE 11/06/16: Study Conclusions  - Left ventricle: The cavity size was normal. There was mild concentric hypertrophy. Systolic function was normal. The estimated ejection fraction was in the range of 55% to 60%. Wall motion was normal; there were no regional wall motion abnormalities. Features are consistent with a pseudonormal left ventricular filling pattern, with concomitant abnormal relaxation and increased filling pressure (grade 2 diastolic dysfunction). - Aortic valve: There was moderate to severe stenosis. Mean gradient (S): 30 mm Hg. Valve area (VTI): 0.98 cm^2. Valve area (Vmax): 1.05 cm^2. Valve area (Vmean): 1.01 cm^2. - Mitral valve: Severely calcified annulus. There was moderate regurgitation. - Left atrium: The atrium was severely dilated. - Right atrium: The atrium was moderately dilated. - Tricuspid valve: There was moderate regurgitation. - Pulmonary arteries: Systolic pressure was severely increased. PA peak pressure: 70 mm Hg (S).  Patient Profile     82 y.o. female with history of paroxysmal atrial flutter on eliquis, chronic diastolic CHF, COPD on nocturnal home oxygen, hypertension, hypothyroidism, CKD stage II presentedto hospital secondary to worsening shortness of breath.  Assessment & Plan    1. Acute on chronic respiratory failure: -Overall a difficult situation  -Likely multifactorial including acute on chronic diastolic CHF/pulmonary hypertension, anemia requiring one unit of pRBC, and valvular heart disease -Wants to go home -Continue to ambulate  2. Acute on chronic diastolic CHF: -Diuresis has been difficult -Continue IV Lasix 60 mg bid today, consider further titration of IV Lasix as based on her weights it does not  appear there has been much progress in her status -Consider RHC if she remains inpatient to further assess her right-heart pressure after multiple days of diuresis  -Not on beta blocker given pulmonary status   3. Persistent Afib: -Rate controlled -Continue Cardizem 120 mg daily -Not on beta blocker as above -Eliquis was stopped earlier this admission 2/2 down trending hgb -Flecainide stopped -Uncertain she would be a candidate for resuming of Eliquis upon discharge -CHADS2VASc at least 6 (CHF, HTN, age x 2, vascular disease, female)  4. Anemia: -HGB stable currently -Status post 1 unit pRBC this admission -Per IM  5. AS: -Moderate -Will need outpatient follow up -Uncertain at this  time if she would be a surgical candidate down the road   6. Dispo: -Palliative care on board  Signed, Marcille Blanco [place] Pager: [phone] 11/14/2016, 10:11 AM

## 2016-11-14 NOTE — Progress Notes (Signed)
Patient ID: Toni [name], female   DOB: October 03, 1934, 82 y.o.   MRN: [id number]    [employer] PROGRESS NOTE  Toni [name] SAY:[id number] DOB: 18-Nov-1934 Toni [name]: 11/03/2016 PCP: Pcp Not In System  HPI/Subjective: Patient's major complaint is shortness of breath with exertion. Reports her lower extent is swelling is getting worse , no significant weight loss for the past  3 days Still having cough but nonproductive..  Objective: Vitals:   11/14/16 0752 11/14/16 1230  BP: (!) 143/86 (!) 120/51  Pulse: 82 81  Resp: 18 12  Temp: 97.7 F (36.5 C)     Filed Weights   11/12/16 0517 11/13/16 0424 11/14/16 0443  Weight: 90 kg (198 lb 8 oz) 90.1 kg (198 lb 11.2 oz) 89.9 kg (198 lb 1.6 oz)    ROS: Review of Systems  Constitutional: Negative for chills and fever.  Eyes: Negative for blurred vision.  Respiratory: Positive for cough. Negative for shortness of breath.   Cardiovascular: Negative for chest pain.  Gastrointestinal: Negative for abdominal pain, constipation, diarrhea, nausea and vomiting.  Genitourinary: Negative for dysuria.  Musculoskeletal: Negative for joint pain.  Neurological: Negative for dizziness and headaches.   Exam: Physical Exam  Constitutional: She is oriented to person, place, and time.  HENT:  Nose: No mucosal edema.  Mouth/Throat: No oropharyngeal exudate or posterior oropharyngeal edema.  Eyes: Conjunctivae, EOM and lids are normal. Pupils are equal, round, and reactive to light.  Neck: No JVD present. Carotid bruit is not present. No edema present. No thyroid mass and no thyromegaly present.  Cardiovascular: S1 normal and S2 normal.  Exam reveals no gallop.   Murmur heard.  Systolic murmur is present with a grade of 4/6  Pulses:      Dorsalis pedis pulses are 2+ on the right side, and 2+ on the left side.  Respiratory: No respiratory distress. She has decreased breath sounds in the right lower field and the left lower field. She has no wheezes. She  has no rhonchi. She has no rales.  GI: Soft. Bowel sounds are normal. There is no tenderness.  Musculoskeletal:       Right ankle: She exhibits swelling.       Left ankle: She exhibits swelling.  Lymphadenopathy:    She has no cervical adenopathy.  Neurological: She is alert and oriented to person, place, and time. No cranial nerve deficit.  Skin: Skin is warm. No rash noted. Nails show no clubbing.  Psychiatric: She has a normal mood and affect.      Data Reviewed: Basic Metabolic Panel:  Recent Labs Lab 11/09/16 0313 11/10/16 0333 11/11/16 0519 11/12/16 0431 11/14/16 0308  NA 138 142 140 139 138  K 4.1 4.3 4.2 4.3 4.3  CL 99* 100* 98* 98* 95*  CO2 32 32 32 33* 33*  GLUCOSE 126* 108* 90 101* 109*  BUN 68* 64* 59* 58* 55*  CREATININE 1.71* 1.53* 1.48* 1.39* 1.28*  CALCIUM 8.1* 8.2* 8.2* 8.1* 8.3*   CBC:  Recent Labs Lab 11/08/16 0529 11/09/16 0313 11/10/16 0333 11/11/16 0519 11/12/16 0431 11/14/16 0308  WBC 10.4  --   --   --   --  11.5*  HGB 7.7* 8.9* 9.0* 9.2* 9.1* 10.1*  HCT 22.8*  --   --   --   --  30.9*  MCV 86.4  --   --   --   --  86.8  PLT 308  --   --   --   --  384   BNP (last 3 results)  Recent Labs  11/03/16 1625  BNP 338.0*     Scheduled Meds: . sodium chloride   Intravenous Once  . budesonide (PULMICORT) nebulizer solution  0.5 mg Nebulization BID  . calcium carbonate  1 tablet Oral BID  . diltiazem  120 mg Oral Daily  . docusate sodium  100 mg Oral BID  . ipratropium-albuterol  3 mL Nebulization Q6H  . levothyroxine  75 mcg Oral QAC breakfast  . nystatin  5 mL Oral QID  . omega-3 acid ethyl esters  1 g Oral Daily  . pantoprazole  40 mg Oral BID  . polyethylene glycol  17 g Oral Daily  . potassium chloride  20 mEq Oral BID  . predniSONE  50 mg Oral Q breakfast  . rOPINIRole  0.25 mg Oral QHS  . sodium chloride flush  3 mL Intravenous Q12H  . traZODone  50 mg Oral QHS    Assessment/Plan:  1. Acute diastolic congestive heart  failure with moderate to severe aortic stenosis, significant pitting edema of lower extremities. Not clinically improving Cardiology following. Patient is started on Lasix drip today. Continue close monitoring of daily weights and monitor renal function Cardiology stopped flecainide. No beta blocker as per cardiology. Patient on diltiazem 2. COPD exacerbation with pneumonia on repeat chest x-ray. Could also be a radiation pneumonitis. Continue   by mouth prednisone  Patient finished Rocephin and Zithromax course. Continue nebulizer treatments.  Very slow to progress. Consult pulmonology if needed 3.   AcuteOn chronic respiratory failure secondary to acute on chronic CHF, COPD exacerbation and chronic pulmonary hypertension as well as anemia  usually only wears oxygen at night. Patient desaturates in the 80s even with oxygen with very limited movement 3. Atrial fibrillation. On 120 mg diltiazem. Holding Eliquis with drop in hemoglobin. Status post 1 unit of blood transfusion. Monitor hemoglobin closely Stopped flecainide with pauses. 4. Restless leg syndrome. Improved with Requip 5. Acute kidney injury on chronic kidney disease stage III. Watch closely on IV Lasix. Creatinine at 1.5 pre--1.48--1.39--1.28  today 6. Weakness. Physical therapy evaluation  7. Hypothyroidism unspecified on levothyroxine 8. GERD on Protonix 9. History of lung cancer and lung nodule 10. Anemia with slowly dropping hemoglobin. Anticoagulation on hold. Responded well to blood transfusion. Hemoglobin at 10.1  11. Constipation- resolved  Code Status:     Code Status Orders        Start     Ordered   11/03/16 2040  Full code  Continuous     11/03/16 2039    Code Status History    Date Active Date Inactive Code Status Order ID Comments User Context   02/16/2016  9:27 PM 02/26/2016  2:33 PM Full Code [id number]  Lance Coon, MD Inpatient   11/16/2013 12:33 AM 11/24/2013  6:53 PM Full Code [id number]  Angelica Ran, MD  Inpatient     Family Communication: Husband Today Disposition Plan: To be determined  Consultants:  Cardiology  Antibiotics:  Rocephin  Zithromax finished  Time spent: 33 minutes Beckam Abdulaziz, [employer]

## 2016-11-14 NOTE — Care Management (Signed)
Confirmed that [employer] has the order for the portable concentrator.  CM will notify agency when patient discharges and it will be delivered to her home

## 2016-11-14 NOTE — Progress Notes (Signed)
Daily Progress Note   Patient Name: Toni [name]       Date: 11/14/2016 DOB: Mar 11, 1935  Age: 82 y.o. MRN#: [id number] Attending Physician: Nicholes Mango, MD Primary Care Physician: Pcp Not In System Admit Date: 11/03/2016  Reason for Consultation/Follow-up: Establishing goals of care  Subjective: Patient sitting up in bed. Asks me if I can discharge her. Has no complaints. I reviewed palliative medicine concepts with her again. We discussed that her illness exacerbations are likely to increase in frequency and intensity over time. She verbalizes understanding, but I believe she has limited insight into her disease state and trajectory. Again discussed advance directives and code status. She continues to desire full scope of treatment including CPR and intubation if needed despite the fact she has a chronic life limiting illness. She agrees to palliative services at home for continued [place] discussions.  Review of Systems  Respiratory: Positive for cough and shortness of breath.   Cardiovascular: Positive for leg swelling.  Psychiatric/Behavioral: Negative for depression.    Length of Stay: 11  Current Medications: Scheduled Meds:  . sodium chloride   Intravenous Once  . budesonide (PULMICORT) nebulizer solution  0.5 mg Nebulization BID  . calcium carbonate  1 tablet Oral BID  . diltiazem  120 mg Oral Daily  . docusate sodium  100 mg Oral BID  . ipratropium-albuterol  3 mL Nebulization Q6H  . levothyroxine  75 mcg Oral QAC breakfast  . nystatin  5 mL Oral QID  . omega-3 acid ethyl esters  1 g Oral Daily  . pantoprazole  40 mg Oral BID  . polyethylene glycol  17 g Oral Daily  . potassium chloride  20 mEq Oral BID  . predniSONE  50 mg Oral Q breakfast  . rOPINIRole  0.25 mg Oral QHS  .  sodium chloride flush  3 mL Intravenous Q12H  . traZODone  50 mg Oral QHS    Continuous Infusions: . furosemide (LASIX) infusion      PRN Meds: acetaminophen **OR** acetaminophen, ALPRAZolam, alum & mag hydroxide-simeth, diphenhydrAMINE, loratadine, magnesium hydroxide, ondansetron **OR** ondansetron (ZOFRAN) IV, oxyCODONE-acetaminophen, simethicone  Physical Exam  Constitutional: She is oriented to person, place, and time.  Cardiovascular: Normal rate and regular rhythm.   BLE 1+ edema  Pulmonary/Chest: Effort normal.  Diminished   Neurological: She is  alert and oriented to person, place, and time.  Psychiatric:  Limited insight            Vital Signs: BP (!) 143/86 (BP Location: Right Arm)   Pulse 82   Temp 97.7 F (36.5 C) (Oral)   Resp 18   Ht '5\' 6"'$  (1.676 m)   Wt 89.9 kg (198 lb 1.6 oz)   SpO2 100%   BMI 31.97 kg/m  SpO2: SpO2: 100 % O2 Device: O2 Device: Nasal Cannula O2 Flow Rate: O2 Flow Rate (L/min): 3 L/min  Intake/output summary:  Intake/Output Summary (Last 24 hours) at 11/14/16 1121 Last data filed at 11/14/16 1055  Gross per 24 hour  Intake              600 ml  Output              450 ml  Net              150 ml   LBM: Last BM Date: 11/13/16 Baseline Weight: Weight: 81.6 kg (180 lb) Most recent weight: Weight: 89.9 kg (198 lb 1.6 oz)       Palliative Assessment/Data: PPS: 50%   Flowsheet Rows   Flowsheet Row Most Recent Value  Intake Tab  Referral Department  Hospitalist  Unit at Time of Referral  Cardiac/Telemetry Unit  Palliative Care Primary Diagnosis  Pulmonary  Date Notified  11/12/16  Palliative Care Type  New Palliative care  Reason for referral  Clarify Goals of Care  Date of Admission  11/03/16  # of days IP prior to Palliative referral  9  Clinical Assessment  Psychosocial & Spiritual Assessment  Palliative Care Outcomes      Patient Active Problem List   Diagnosis Date Noted  . Goals of care, counseling/discussion   .  Palliative care by specialist   . Congestive heart failure ([place])   . PAF (paroxysmal atrial fibrillation) ([place]) 11/09/2016  . Cough   . Anemia in chronic kidney disease 11/07/2016  . Aortic valve stenosis, moderate 08/03/2016  . Adjustment disorder with anxiety 02/23/2016  . Dyspepsia   . Esophageal reflux   . Encounter for anticoagulation discussion and counseling 11/17/2015  . Fall 11/17/2015  . Essential hypertension, benign   . Lung infiltrate on CT 06/08/2015  . Acute on chronic diastolic heart failure ([place]) 05/17/2015  . Insomnia 04/17/2014  . Squamous cell lung cancer, right ([place]) 03/27/2014  . Postnasal drip 03/05/2014  . Hyperlipidemia 12/04/2013  . Bilateral leg edema 12/04/2013  . COPD exacerbation ([place]) 11/18/2013  . Atypical atrial flutter ([place]) 11/16/2013  . COPD (chronic obstructive pulmonary disease) ([place]) 11/15/2013  . Chronic kidney disease 08/24/2013  . Essential (primary) hypertension 08/24/2013  . Adult hypothyroidism 08/24/2013  . Acute exacerbation of chronic obstructive airways disease ([place]) 08/24/2013  . Chronic obstructive pulmonary disease with acute exacerbation ([place]) 08/24/2013    Palliative Care Assessment & Plan   Patient Profile: 82 y.o. female  with past medical history of paroxysmal a fib. (on eliquis), CHF, COPD (on home O2 at night), HTN, hypothyroidism, CKD stage II admitted on 11/03/2016 with increasing SOB.  Workup revealed CHF exacerbation, COPD exacerbation, fluid overload. Admitted for diuresis. Palliative consulted for [place].  Assessment/Recommendations/Plan   Patient with limited insight into disease state and trajectory. Poor prognosis going forward due to difficult to diurese this admission.  Wishes to continue full scope care including full code status.   She would benefit from ongoing [place] discussion from her PCP, pulmonologist  and cardiologist at discharge.  Spoke with Scientist, research (medical) at [facility] re: palliative services at  home for continued [place] and home transition. Community is able to provide services and monitor patient status at home alongside other home health services including home PT.   Goals of Care and Additional Recommendations:  Limitations on Scope of Treatment: Full Scope Treatment  Code Status:  Full code  Prognosis:   Unable to determine  Discharge Planning:  Home with Palliative Services  Care plan was discussed with patient.  Thank you for allowing the Palliative Medicine Team to assist in the care of this patient.   Total time: 35 minutes   Greater than 50%  of this time was spent counseling and coordinating care related to the above assessment and plan.  Mariana Kaufman, AGNP-C Palliative Medicine   Please contact Palliative Medicine Team phone at [phone] for questions and concerns.

## 2016-11-14 NOTE — Plan of Care (Signed)
Problem: Respiratory: Goal: Ability to achieve and maintain a regular respiratory rate will improve Outcome: Progressing Dyspnea on exertion continues when up to bedside commode with 1 assist.  Recovers quickly with rest.

## 2016-11-15 DIAGNOSIS — Z7189 Other specified counseling: Secondary | ICD-10-CM

## 2016-11-15 DIAGNOSIS — N039 Chronic nephritic syndrome with unspecified morphologic changes: Secondary | ICD-10-CM

## 2016-11-15 LAB — BASIC METABOLIC PANEL
ANION GAP: 7 (ref 5–15)
BUN: 52 mg/dL — ABNORMAL HIGH (ref 6–20)
CHLORIDE: 96 mmol/L — AB (ref 101–111)
CO2: 36 mmol/L — AB (ref 22–32)
Calcium: 8.2 mg/dL — ABNORMAL LOW (ref 8.9–10.3)
Creatinine, Ser: 1.21 mg/dL — ABNORMAL HIGH (ref 0.44–1.00)
GFR calc non Af Amer: 41 mL/min — ABNORMAL LOW (ref >60)
GFR, EST AFRICAN AMERICAN: 48 mL/min — AB (ref >60)
GLUCOSE: 114 mg/dL — AB (ref 65–99)
Potassium: 3.7 mmol/L (ref 3.5–5.1)
Sodium: 139 mmol/L (ref 135–145)

## 2016-11-15 LAB — CBC
HEMATOCRIT: 28.7 % — AB (ref 35.0–47.0)
HEMOGLOBIN: 9.6 g/dL — AB (ref 12.0–16.0)
MCH: 29.3 pg (ref 26.0–34.0)
MCHC: 33.5 g/dL (ref 32.0–36.0)
MCV: 87.5 fL (ref 80.0–100.0)
Platelets: 387 10*3/uL (ref 150–440)
RBC: 3.28 MIL/uL — AB (ref 3.80–5.20)
RDW: 16.6 % — ABNORMAL HIGH (ref 11.5–14.5)
WBC: 9.9 10*3/uL (ref 3.6–11.0)

## 2016-11-15 MED ORDER — FUROSEMIDE 40 MG PO TABS
60.0000 mg | ORAL_TABLET | Freq: Two times a day (BID) | ORAL | Status: DC
  Administered 2016-11-15: 60 mg via ORAL
  Filled 2016-11-15: qty 1

## 2016-11-15 MED ORDER — FUROSEMIDE 10 MG/ML IJ SOLN
60.0000 mg | Freq: Two times a day (BID) | INTRAMUSCULAR | Status: DC
  Administered 2016-11-16 – 2016-11-19 (×6): 60 mg via INTRAVENOUS
  Filled 2016-11-15 (×6): qty 6

## 2016-11-15 MED ORDER — POTASSIUM CHLORIDE CRYS ER 20 MEQ PO TBCR
30.0000 meq | EXTENDED_RELEASE_TABLET | Freq: Two times a day (BID) | ORAL | Status: DC
  Administered 2016-11-15 – 2016-11-19 (×9): 30 meq via ORAL
  Filled 2016-11-15 (×9): qty 1

## 2016-11-15 NOTE — Progress Notes (Signed)
Patient Name: Toni [name] Date of Encounter: 11/15/2016  Primary Cardiologist: [facility] Problem List     Active Problems:   Acute on chronic diastolic heart failure (HCC)   Anemia in chronic kidney disease   Cough   PAF (paroxysmal atrial fibrillation) (HCC)   Congestive heart failure (HCC)   Goals of care, counseling/discussion   Palliative care by specialist     Subjective   SOB about the same. Remains in 2 L via nasal cannula. Minus 2.3 L for the past 24 hours on Lasix gtt. Weight down 2 pounds this morning, though remains 6 pounds up for the admission. Lasix gtt stopped overnight. HGB down 0.5 g/dL this morning. Renal function continues to improve.     Inpatient Medications    Scheduled Meds: . sodium chloride   Intravenous Once  . budesonide (PULMICORT) nebulizer solution  0.5 mg Nebulization BID  . calcium carbonate  1 tablet Oral BID  . diltiazem  120 mg Oral Daily  . docusate sodium  100 mg Oral BID  . ipratropium-albuterol  3 mL Nebulization Q6H  . levothyroxine  75 mcg Oral QAC breakfast  . nystatin  5 mL Oral QID  . omega-3 acid ethyl esters  1 g Oral Daily  . pantoprazole  40 mg Oral BID  . polyethylene glycol  17 g Oral Daily  . potassium chloride  20 mEq Oral BID  . predniSONE  50 mg Oral Q breakfast  . rOPINIRole  0.25 mg Oral QHS  . sodium chloride flush  3 mL Intravenous Q12H  . traZODone  50 mg Oral QHS   Continuous Infusions:  PRN Meds: acetaminophen **OR** acetaminophen, ALPRAZolam, alum & mag hydroxide-simeth, diphenhydrAMINE, loratadine, magnesium hydroxide, ondansetron **OR** ondansetron (ZOFRAN) IV, oxyCODONE-acetaminophen, simethicone   Vital Signs    Vitals:   11/14/16 1944 11/14/16 2011 11/15/16 0205 11/15/16 0433  BP: (!) 117/45   (!) 142/58  Pulse: 82   87  Resp: 18   18  Temp: 98 F (36.7 C)   97.6 F (36.4 C)  TempSrc: Oral   Oral  SpO2: 95% 94% 95% 99%  Weight:    196 lb 11.2 oz (89.2 kg)  Height:         Intake/Output Summary (Last 24 hours) at 11/15/16 0840 Last data filed at 11/15/16 0800  Gross per 24 hour  Intake           753.34 ml  Output             2300 ml  Net         -1546.66 ml   Filed Weights   11/13/16 0424 11/14/16 0443 11/15/16 0433  Weight: 198 lb 11.2 oz (90.1 kg) 198 lb 1.6 oz (89.9 kg) 196 lb 11.2 oz (89.2 kg)    Physical Exam    GEN: Well nourished, well developed, in no acute distress.  HEENT: Grossly normal.  Neck: Supple, no JVD, carotid bruits, or masses. Cardiac: Irregularly irregular, II/VI systolic murmur, no rubs, or gallops. No clubbing, cyanosis. Trace pre-tibial edema.  Radials/DP/PT 2+ and equal bilaterally.  Respiratory:  Respirations regular and unlabored, clear to auscultation bilaterally. GI: Soft, nontender, nondistended, BS + x 4. MS: no deformity or atrophy. Skin: warm and dry, no rash. Neuro:  Strength and sensation are intact. Psych: AAOx3.  Normal affect.  Labs    CBC  Recent Labs  11/14/16 0308 11/15/16 0323  WBC 11.5* 9.9  HGB 10.1* 9.6*  HCT 30.9*  28.7*  MCV 86.8 87.5  PLT 384 562   Basic Metabolic Panel  Recent Labs  11/14/16 0308 11/15/16 0323  NA 138 139  K 4.3 3.7  CL 95* 96*  CO2 33* 36*  GLUCOSE 109* 114*  BUN 55* 52*  CREATININE 1.28* 1.21*  CALCIUM 8.3* 8.2*   Liver Function Tests No results for input(s): AST, ALT, ALKPHOS, BILITOT, PROT, ALBUMIN in the last 72 hours. No results for input(s): LIPASE, AMYLASE in the last 72 hours. Cardiac Enzymes No results for input(s): CKTOTAL, CKMB, CKMBINDEX, TROPONINI in the last 72 hours. BNP Invalid input(s): POCBNP D-Dimer No results for input(s): DDIMER in the last 72 hours. Hemoglobin A1C No results for input(s): HGBA1C in the last 72 hours. Fasting Lipid Panel No results for input(s): CHOL, HDL, LDLCALC, TRIG, CHOLHDL, LDLDIRECT in the last 72 hours. Thyroid Function Tests No results for input(s): TSH, T4TOTAL, T3FREE, THYROIDAB in the last 72  hours.  Invalid input(s): FREET3  Telemetry    Afib, 70's bpm, frequent PVCs, occasionally in a pattern of ventricular bigeminy - Personally Reviewed  ECG    n/a - Personally Reviewed  Radiology    No results found.  Cardiac Studies   TTE 11/06/16: Study Conclusions  - Left ventricle: The cavity size was normal. There was mild concentric hypertrophy. Systolic function was normal. The estimated ejection fraction was in the range of 55% to 60%. Wall motion was normal; there were no regional wall motion abnormalities. Features are consistent with a pseudonormal left ventricular filling pattern, with concomitant abnormal relaxation and increased filling pressure (grade 2 diastolic dysfunction). - Aortic valve: There was moderate to severe stenosis. Mean gradient (S): 30 mm Hg. Valve area (VTI): 0.98 cm^2. Valve area (Vmax): 1.05 cm^2. Valve area (Vmean): 1.01 cm^2. - Mitral valve: Severely calcified annulus. There was moderate regurgitation. - Left atrium: The atrium was severely dilated. - Right atrium: The atrium was moderately dilated. - Tricuspid valve: There was moderate regurgitation. - Pulmonary arteries: Systolic pressure was severely increased. PA peak pressure: 70 mm Hg (S).  Patient Profile     82 y.o. female with history of paroxysmal atrial flutter on eliquis, chronic diastolic CHF, COPD on nocturnal home oxygen, hypertension, hypothyroidism, CKD stage II presentedto hospital secondary to worsening shortness of breath.  Assessment & Plan    1. Acute on chronic respiratory failure: -Overall a difficult situation  -Likely multifactorial including acute on chronic diastolic CHF/pulmonary hypertension, anemia requiring one unit of pRBC, and valvular heart disease -Wants to go home -Continue to ambulate  2. Acute on chronic diastolic CHF: -Diuresis has been difficult -Started on Lasix gtt on 2/27 that was discontinued overnight. Minus 2.3  L for the past 24 hours with decrease in weight of 2 pounds -If no plans to resume Lasix gtt would start PO Lasix 60 mg bid with KCl repletion  -Consider RHC if she remains inpatient to further assess her right-heart pressure after multiple days of diuresis  -Not on beta blocker given pulmonary status   3. Persistent Afib/frequent PVCs: -Rate controlled -Continue Cardizem 120 mg daily -Not on beta blocker as above -Eliquis was stopped earlier this admission 2/2 down trending hgb -Flecainide stopped -Uncertain she would be a candidate for resuming of Eliquis upon discharge -CHADS2VASc at least 6 (CHF, HTN, age x 2, vascular disease, female)  4. Anemia: -HGB down 0.5 g/dL this morning -Etiology of anemia remains uncertain -Consider hemoccult, defer to IM -Status post 1 unit pRBC this admission  5. AS: -  Moderate -Will need outpatient follow up -Uncertain at this time if she would be a surgical candidate down the road   6. Dispo: -Palliative care on board  Signed, Marcille Blanco [place] Pager: [phone] 11/15/2016, 8:40 AM   Attending Note Patient seen and examined, agree with detailed note above,  Patient presentation and plan discussed on rounds.   Patient reports she is in good spirits, sometimes tearful in the late afternoon Still on 3-4 L nasal cannula oxygen, leg/foot swelling bilaterally Chronic cough Has not been ambulating.  Telemetry reviewed person by myself showing atrial fibrillation, PVCs  On physical exam coarse lung sounds to bases, Heart sounds irregularly irregular, ectopy noted Abdomen soft nontender, 1-2+ pitting edema of her feet, mid to lower shins   --- Acute on chronic diastolic CHF Given continued pitting edema lower extremities, stable if not improved renal function Would continue  diuresis either with Lasix infusion or high-dose IV Lasix bolus twice a day Currently on Lasix by mouth Will not give her dose of Lasix IV this  evening given blood pressure 99 systolic  ----Atrial fibrillation Rate relatively well-controlled Anticoagulation on hold given anemia  -----COPD Long history of smoking, likely contributing to underlying pulmonary hypertension  Greater than 50% was spent in counseling and coordination of care with patient Total encounter time 25 minutes or more   Signed: Esmond Plants  M.D., Ph.D. [facility]

## 2016-11-15 NOTE — Progress Notes (Signed)
Physical Therapy Treatment Patient Details Name: Toni [name] MRN: [id number] DOB: 1935/08/02 Today's Date: 11/15/2016    History of Present Illness presented to ER secondary to worsening SOB; admitted with acute/chronic CHF, COPD.  Currently on 2L supplemental O2 via [place]    PT Comments    Pt has met goals for therapy and has been educated on endurance training and knowing limits of exertion. Pt able to ambulate on 3L of O2 with safe technique with seated rest breaks as necessary. Simulated stair training with pt able to safely alt. Toe taps with B UE assistance. Pt is also making improvement on 5 time sit<>stand testing progressing to 18 sec this date inferring increased power/speed. Recommending dc home with HHPT for further needs. Will dc in house at this time.   Follow Up Recommendations  Home health PT     Equipment Recommendations  Rolling walker with 5" wheels    Recommendations for Other Services       Precautions / Restrictions Precautions Precautions: Fall Restrictions Weight Bearing Restrictions: No    Mobility  Bed Mobility               General bed mobility comments: not performed as pt received in recliner  Transfers Overall transfer level: Modified independent Equipment used: Rolling walker (2 wheeled) Transfers: Sit to/from Stand Sit to Stand: Modified independent (Device/Increase time)         General transfer comment: safe technique performed with RW. 3L of O2 used for mobility  Ambulation/Gait Ambulation/Gait assistance: Supervision Ambulation Distance (Feet): 80 Feet Assistive device: Rolling walker (2 wheeled) Gait Pattern/deviations: Step-through pattern     General Gait Details: Slightly decreased ambulation speed this session however reciprocal gait pattern performed and safe technique. Slight increased SOB symptoms noted with ambulation. O2 sats decreased to 86% with exertion and HR increases to 131. Takes a few minutes for sats to  improve to 95%. Seated rest break given.   Stairs            Wheelchair Mobility    Modified Rankin (Stroke Patients Only)       Balance                                    Cognition Arousal/Alertness: Awake/alert Behavior During Therapy: WFL for tasks assessed/performed Overall Cognitive Status: Within Functional Limits for tasks assessed                      Exercises Other Exercises Other Exercises: Pt assisted for bathroom tasks, needed cga for hygiene and donning underwear. Safe technique performed with hygiene.  Other Exercises: Pt performed 5 time sit<>stand test in 18 seconds this date using hands for push off. Pt with SOB symptoms noted with exertion. Other Exercises: Standing alt. toe taps on low shelf to simulate stair training. Pt able to perform 10 reps on B LE using surface counter top for stability. Safe technique performed with O2 sats remaining at 98% on 3L of O2.    General Comments        Pertinent Vitals/Pain Pain Assessment: Faces Faces Pain Scale: Hurts a little bit Pain Location: L knee with ambulation Pain Descriptors / Indicators: Aching;Dull Pain Intervention(s): Limited activity within patient's tolerance    Home Living                      Prior Function  PT Goals (current goals can now be found in the care plan section) Acute Rehab PT Goals Patient Stated Goal: to return home and have therapy there PT Goal Formulation: With patient Time For Goal Achievement: 11/18/16 Potential to Achieve Goals: Good Progress towards PT goals: Progressing toward goals;Goals met/education completed, patient discharged from PT    Frequency    Min 2X/week      PT Plan Current plan remains appropriate    Co-evaluation             End of Session Equipment Utilized During Treatment: Gait belt;Oxygen Activity Tolerance: Patient tolerated treatment well;Patient limited by fatigue Patient left: in  chair;with call bell/phone within reach;with chair alarm set;with family/visitor present Nurse Communication: Mobility status PT Visit Diagnosis: Unsteadiness on feet (R26.81);Muscle weakness (generalized) (M62.81)     Time: 7014-1030 PT Time Calculation (min) (ACUTE ONLY): 23 min  Charges:  $Gait Training: 8-22 mins $Therapeutic Exercise: 8-22 mins                    G Codes:       Martesha Niedermeier 16-Nov-2016, 3:26 PM  Greggory Stallion, PT, DPT [phone]

## 2016-11-15 NOTE — Progress Notes (Signed)
Patient ID: Toni [name], female   DOB: 1935-04-28, 82 y.o.   MRN: [id number]    [employer] PROGRESS NOTE  Toni [name] [id number] DOB: 1935/08/26 DOA: 11/03/2016 PCP: Pcp Not In System  HPI/Subjective: She is about 7.5 L fluid negative, creatinine improved.  She is still feeling quite short of breath while sitting in chair. her husband is at bedside  Objective: Vitals:   11/15/16 0433 11/15/16 0900  BP: (!) 142/58 (!) 99/56  Pulse: 87 75  Resp: 18 18  Temp: 97.6 F (36.4 C) 98 F (36.7 C)    Filed Weights   11/13/16 0424 11/14/16 0443 11/15/16 0433  Weight: 90.1 kg (198 lb 11.2 oz) 89.9 kg (198 lb 1.6 oz) 89.2 kg (196 lb 11.2 oz)    ROS: Review of Systems  Constitutional: Negative for chills and fever.  Eyes: Negative for blurred vision.  Respiratory: Positive for cough and shortness of breath.   Cardiovascular: Negative for chest pain.  Gastrointestinal: Negative for abdominal pain, constipation, diarrhea, nausea and vomiting.  Genitourinary: Negative for dysuria.  Musculoskeletal: Negative for joint pain.  Neurological: Negative for dizziness and headaches.   Exam: Physical Exam  Constitutional: She is oriented to person, place, and time.  HENT:  Nose: No mucosal edema.  Mouth/Throat: No oropharyngeal exudate or posterior oropharyngeal edema.  Eyes: Conjunctivae, EOM and lids are normal. Pupils are equal, round, and reactive to light.  Neck: No JVD present. Carotid bruit is not present. No edema present. No thyroid mass and no thyromegaly present.  Cardiovascular: S1 normal and S2 normal.  Exam reveals no gallop.   Murmur heard.  Systolic murmur is present with a grade of 4/6  Pulses:      Dorsalis pedis pulses are 2+ on the right side, and 2+ on the left side.  Respiratory: No respiratory distress. She has decreased breath sounds in the right lower field and the left lower field. She has no wheezes. She has no rhonchi. She has no rales.  GI: Soft.  Bowel sounds are normal. There is no tenderness.  Musculoskeletal:       Right ankle: She exhibits swelling.       Left ankle: She exhibits swelling.  Lymphadenopathy:    She has no cervical adenopathy.  Neurological: She is alert and oriented to person, place, and time. No cranial nerve deficit.  Skin: Skin is warm. No rash noted. Nails show no clubbing.  Psychiatric: She has a normal mood and affect.      Data Reviewed: Basic Metabolic Panel:  Recent Labs Lab 11/10/16 0333 11/11/16 0519 11/12/16 0431 11/14/16 0308 11/15/16 0323  NA 142 140 139 138 139  K 4.3 4.2 4.3 4.3 3.7  CL 100* 98* 98* 95* 96*  CO2 32 32 33* 33* 36*  GLUCOSE 108* 90 101* 109* 114*  BUN 64* 59* 58* 55* 52*  CREATININE 1.53* 1.48* 1.39* 1.28* 1.21*  CALCIUM 8.2* 8.2* 8.1* 8.3* 8.2*   CBC:  Recent Labs Lab 11/10/16 0333 11/11/16 0519 11/12/16 0431 11/14/16 0308 11/15/16 0323  WBC  --   --   --  11.5* 9.9  HGB 9.0* 9.2* 9.1* 10.1* 9.6*  HCT  --   --   --  30.9* 28.7*  MCV  --   --   --  86.8 87.5  PLT  --   --   --  384 387   BNP (last 3 results)  Recent Labs  11/03/16 1625  BNP 338.0*  Scheduled Meds: . sodium chloride   Intravenous Once  . budesonide (PULMICORT) nebulizer solution  0.5 mg Nebulization BID  . calcium carbonate  1 tablet Oral BID  . diltiazem  120 mg Oral Daily  . docusate sodium  100 mg Oral BID  . furosemide  60 mg Oral BID  . ipratropium-albuterol  3 mL Nebulization Q6H  . levothyroxine  75 mcg Oral QAC breakfast  . nystatin  5 mL Oral QID  . omega-3 acid ethyl esters  1 g Oral Daily  . pantoprazole  40 mg Oral BID  . polyethylene glycol  17 g Oral Daily  . potassium chloride  30 mEq Oral BID  . predniSONE  50 mg Oral Q breakfast  . rOPINIRole  0.25 mg Oral QHS  . sodium chloride flush  3 mL Intravenous Q12H  . traZODone  50 mg Oral QHS    Assessment/Plan:  1. Acute diastolic congestive heart failure with moderate to severe aortic stenosis,  significant pitting edema of lower extremities. Not clinically improving Cardiology following.  We will put her on Lasix IV 60 mg twice daily continue close monitoring of daily weights and monitor renal function Cardiology stopped flecainide. No beta blocker as per cardiology. Patient on diltiazem 2. COPD exacerbation with pneumonia on repeat chest x-ray. Could also be a radiation pneumonitis. Continue   by mouth prednisone  Patient finished Rocephin and Zithromax course. Continue nebulizer treatments.  Very slow to progress.  3.   Acute on chronic respiratory failure secondary to acute on chronic CHF, COPD exacerbation and chronic pulmonary hypertension as well as anemia. usually only wears oxygen at night. Patient desaturates in the 80s even with oxygen with very limited movement 3. Atrial fibrillation. On 120 mg diltiazem. Holding Eliquis with drop in hemoglobin. Status post 1 unit of blood transfusion. Monitor hemoglobin closely Stopped flecainide with pauses. 4. Restless leg syndrome. Improved with Requip 5. Acute kidney injury on chronic kidney disease stage III. Watch closely on IV Lasix. Creatinine at 1.5 pre--1.48-> 1.21 today 6. Weakness. Physical therapy recommends home health PT 7. Hypothyroidism unspecified on levothyroxine 8. GERD on Protonix 9. History of lung cancer and lung nodule 10. Anemia with slowly dropping hemoglobin. Anticoagulation on hold. Responded well to blood transfusion. Hemoglobin at 10.1 -check Hemoccult stool  11. Constipation- resolved  Code Status: DNR  Family Communication: Husband Today, also discussed case with Dr. Rockey Situ Disposition Plan: Home with home health PT. may be next 2-3 days depending on her diuresis and kidney function  Consultants:  Cardiology  Antibiotics:  Rocephin  Zithromax finished  Time spent: 33 minutes [place]

## 2016-11-15 NOTE — Progress Notes (Signed)
Daily Progress Note   Patient Name: Toni [name]       Date: 11/15/2016 DOB: [birth year]  Age: 82 y.o. MRN#: [id number] Attending Physician: Max Sane, MD Primary Care Physician: Pcp Not In System Admit Date: 11/03/2016  Reason for Consultation/Follow-up: Establishing goals of care  Subjective: Patient in chair at bedside. Continues to require O2. Note per cardiology- still up 6lbs. Off IV diuresis.  Continued GOC discussion- has severe aortic stenosis- may not be surgical candidate. CHF and COPD are likely advancing with new O2 requirements, difficulty diuresing. Her goals continue to be to stabilize and return home. She seems to have more insight and thoughtfulness about her disease state today, is answering my questions, rather than laughing and saying "I don't know" as she was before. Code status discussed- she desires DNR. She is receptive to palliative followup at home.  Review of Systems  Respiratory: Positive for cough and shortness of breath.   Cardiovascular: Positive for leg swelling.  Psychiatric/Behavioral: Negative for depression.    Length of Stay: 12  Current Medications: Scheduled Meds:  . sodium chloride   Intravenous Once  . budesonide (PULMICORT) nebulizer solution  0.5 mg Nebulization BID  . calcium carbonate  1 tablet Oral BID  . diltiazem  120 mg Oral Daily  . docusate sodium  100 mg Oral BID  . furosemide  60 mg Oral BID  . ipratropium-albuterol  3 mL Nebulization Q6H  . levothyroxine  75 mcg Oral QAC breakfast  . nystatin  5 mL Oral QID  . omega-3 acid ethyl esters  1 g Oral Daily  . pantoprazole  40 mg Oral BID  . polyethylene glycol  17 g Oral Daily  . potassium chloride  30 mEq Oral BID  . predniSONE  50 mg Oral Q breakfast  . rOPINIRole  0.25 mg Oral  QHS  . sodium chloride flush  3 mL Intravenous Q12H  . traZODone  50 mg Oral QHS    Continuous Infusions:   PRN Meds: acetaminophen **OR** acetaminophen, ALPRAZolam, alum & mag hydroxide-simeth, diphenhydrAMINE, loratadine, magnesium hydroxide, ondansetron **OR** ondansetron (ZOFRAN) IV, oxyCODONE-acetaminophen, simethicone  Physical Exam  Constitutional: She is oriented to person, place, and time.  Cardiovascular: Normal rate and regular rhythm.   BLE 1+ edema  Pulmonary/Chest: Effort normal.  Diminished   Neurological: She is  alert and oriented to person, place, and time.  Skin: Skin is warm and dry.  Psychiatric: She has a normal mood and affect. Her behavior is normal. Judgment and thought content normal.            Vital Signs: BP (!) 99/56 (BP Location: Left Arm)   Pulse 75   Temp 98 F (36.7 C) (Oral)   Resp 18   Ht '5\' 6"'$  (1.676 m)   Wt 89.2 kg (196 lb 11.2 oz)   SpO2 100%   BMI 31.75 kg/m  SpO2: SpO2: 100 % O2 Device: O2 Device: Nasal Cannula O2 Flow Rate: O2 Flow Rate (L/min): 3 L/min  Intake/output summary:   Intake/Output Summary (Last 24 hours) at 11/15/16 1234 Last data filed at 11/15/16 1008  Gross per 24 hour  Intake           753.34 ml  Output             2150 ml  Net         -1396.66 ml   LBM: Last BM Date: 11/13/16 Baseline Weight: Weight: 81.6 kg (180 lb) Most recent weight: Weight: 89.2 kg (196 lb 11.2 oz)       Palliative Assessment/Data: PPS: 50%   Flowsheet Rows   Flowsheet Row Most Recent Value  Intake Tab  Referral Department  Hospitalist  Unit at Time of Referral  Cardiac/Telemetry Unit  Palliative Care Primary Diagnosis  Pulmonary  Date Notified  11/12/16  Palliative Care Type  New Palliative care  Reason for referral  Clarify Goals of Care  Date of Admission  11/03/16  # of days IP prior to Palliative referral  9  Clinical Assessment  Psychosocial & Spiritual Assessment  Palliative Care Outcomes      Patient Active  Problem List   Diagnosis Date Noted  . Goals of care, counseling/discussion   . Palliative care by specialist   . Congestive heart failure ([place])   . PAF (paroxysmal atrial fibrillation) ([place]) 11/09/2016  . Cough   . Anemia in chronic kidney disease 11/07/2016  . Aortic valve stenosis, moderate 08/03/2016  . Adjustment disorder with anxiety 02/23/2016  . Dyspepsia   . Esophageal reflux   . Encounter for anticoagulation discussion and counseling 11/17/2015  . Fall 11/17/2015  . Essential hypertension, benign   . Lung infiltrate on CT 06/08/2015  . Acute on chronic diastolic heart failure ([place]) 05/17/2015  . Insomnia 04/17/2014  . Squamous cell lung cancer, right ([place]) 03/27/2014  . Postnasal drip 03/05/2014  . Hyperlipidemia 12/04/2013  . Bilateral leg edema 12/04/2013  . COPD exacerbation ([place]) 11/18/2013  . Atypical atrial flutter ([place]) 11/16/2013  . COPD (chronic obstructive pulmonary disease) ([place]) 11/15/2013  . Chronic kidney disease 08/24/2013  . Essential (primary) hypertension 08/24/2013  . Adult hypothyroidism 08/24/2013  . Acute exacerbation of chronic obstructive airways disease ([place]) 08/24/2013  . Chronic obstructive pulmonary disease with acute exacerbation ([place]) 08/24/2013    Palliative Care Assessment & Plan   Patient Profile: 82 y.o. female  with past medical history of paroxysmal a fib. (on eliquis), CHF, COPD (on home O2 at night), HTN, hypothyroidism, CKD stage II admitted on 11/03/2016 with increasing SOB.  Workup revealed CHF exacerbation, COPD exacerbation, fluid overload. Admitted for diuresis. Palliative consulted for [place].  Assessment/Recommendations/Plan   Patient with more insight today. Poor prognosis going forward due to difficult to diurese this admission.  Wishes to continue full scope but does desire DNR.  Out of facility DNR to  chart.   Case manager consult for home palliative services.  Goals of Care and Additional  Recommendations:  Limitations on Scope of Treatment: Full Scope Treatment  Code Status:  DNR  Prognosis:   Unable to determine  Discharge Planning:  Home with Palliative Services  Care plan was discussed with patient and spouse.  Thank you for allowing the Palliative Medicine Team to assist in the care of this patient.   Total time: 25 minutes   Greater than 50%  of this time was spent counseling and coordinating care related to the above assessment and plan.  Mariana Kaufman, AGNP-C Palliative Medicine   Please contact Palliative Medicine Team phone at [phone] for questions and concerns.

## 2016-11-16 ENCOUNTER — Ambulatory Visit: Payer: Medicare Other | Admitting: Cardiovascular Disease

## 2016-11-16 ENCOUNTER — Ambulatory Visit: Payer: Medicare Other | Admitting: Pulmonary Disease

## 2016-11-16 LAB — CBC
HCT: 28.4 % — ABNORMAL LOW (ref 35.0–47.0)
Hemoglobin: 9.4 g/dL — ABNORMAL LOW (ref 12.0–16.0)
MCH: 28.7 pg (ref 26.0–34.0)
MCHC: 33.2 g/dL (ref 32.0–36.0)
MCV: 86.6 fL (ref 80.0–100.0)
PLATELETS: 375 10*3/uL (ref 150–440)
RBC: 3.28 MIL/uL — ABNORMAL LOW (ref 3.80–5.20)
RDW: 16.8 % — AB (ref 11.5–14.5)
WBC: 10.4 10*3/uL (ref 3.6–11.0)

## 2016-11-16 LAB — BASIC METABOLIC PANEL
ANION GAP: 7 (ref 5–15)
BUN: 57 mg/dL — ABNORMAL HIGH (ref 6–20)
CALCIUM: 8.6 mg/dL — AB (ref 8.9–10.3)
CO2: 34 mmol/L — ABNORMAL HIGH (ref 22–32)
CREATININE: 1.53 mg/dL — AB (ref 0.44–1.00)
Chloride: 97 mmol/L — ABNORMAL LOW (ref 101–111)
GFR calc Af Amer: 36 mL/min — ABNORMAL LOW (ref >60)
GFR, EST NON AFRICAN AMERICAN: 31 mL/min — AB (ref >60)
GLUCOSE: 95 mg/dL (ref 65–99)
Potassium: 4.2 mmol/L (ref 3.5–5.1)
Sodium: 138 mmol/L (ref 135–145)

## 2016-11-16 MED ORDER — SODIUM CHLORIDE 0.9 % IV SOLN
INTRAVENOUS | Status: DC
  Administered 2016-11-17: 07:00:00 via INTRAVENOUS

## 2016-11-16 MED ORDER — SODIUM CHLORIDE 0.9% FLUSH
3.0000 mL | Freq: Two times a day (BID) | INTRAVENOUS | Status: DC
  Administered 2016-11-16: 3 mL via INTRAVENOUS

## 2016-11-16 MED ORDER — SODIUM CHLORIDE 0.9 % IV SOLN: 250.0000 mL | INTRAVENOUS | Status: DC | PRN

## 2016-11-16 MED ORDER — SODIUM CHLORIDE 0.9% FLUSH: 3.0000 mL | INTRAVENOUS | Status: DC | PRN

## 2016-11-16 NOTE — Progress Notes (Signed)
Diuresing continues with lasix,breathing improving,ambulated fro room to nurses station and back to room and said she feels good,vital signs within normal limits.

## 2016-11-16 NOTE — Care Management (Signed)
Found that patient would meet criteria for [facility] but not  select.  Attending discussed this as a treatment option with patient and husband and they may be open to consider.  CM made attempt to speak with them but had visitors throughout the day.  Schefduled for cardiac cath 3.2

## 2016-11-16 NOTE — Progress Notes (Signed)
Patient Name: Toni [name] Date of Encounter: 11/16/2016  Primary Cardiologist: [facility] Problem List     Active Problems:   Acute on chronic diastolic heart failure (HCC)   Anemia in chronic kidney disease   Cough   PAF (paroxysmal atrial fibrillation) (HCC)   Congestive heart failure (HCC)   Goals of care, counseling/discussion   Palliative care by specialist   Advance care planning     Subjective   SOB about the same. Remains in 2 L via nasal cannula.  Lasix was increased yesterday to 60 mg IV twice daily but her weight actually went up.   Inpatient Medications    Scheduled Meds: . sodium chloride   Intravenous Once  . budesonide (PULMICORT) nebulizer solution  0.5 mg Nebulization BID  . calcium carbonate  1 tablet Oral BID  . diltiazem  120 mg Oral Daily  . docusate sodium  100 mg Oral BID  . furosemide  60 mg Intravenous BID  . ipratropium-albuterol  3 mL Nebulization Q6H  . levothyroxine  75 mcg Oral QAC breakfast  . nystatin  5 mL Oral QID  . omega-3 acid ethyl esters  1 g Oral Daily  . pantoprazole  40 mg Oral BID  . polyethylene glycol  17 g Oral Daily  . potassium chloride  30 mEq Oral BID  . predniSONE  50 mg Oral Q breakfast  . rOPINIRole  0.25 mg Oral QHS  . sodium chloride flush  3 mL Intravenous Q12H  . traZODone  50 mg Oral QHS   Continuous Infusions:  PRN Meds: acetaminophen **OR** acetaminophen, ALPRAZolam, alum & mag hydroxide-simeth, diphenhydrAMINE, loratadine, magnesium hydroxide, ondansetron **OR** ondansetron (ZOFRAN) IV, oxyCODONE-acetaminophen, simethicone   Vital Signs    Vitals:   11/16/16 0238 11/16/16 0617 11/16/16 0629 11/16/16 0720  BP:   139/73 (!) 115/53  Pulse:   85 85  Resp:   16 17  Temp:   97.7 F (36.5 C) 97.5 F (36.4 C)  TempSrc:   Oral Oral  SpO2: 99%  100% 99%  Weight:  202 lb 12.8 oz (92 kg)    Height:        Intake/Output Summary (Last 24 hours) at 11/16/16 0949 Last data filed at 11/15/16  1300  Gross per 24 hour  Intake              600 ml  Output                0 ml  Net              600 ml   Filed Weights   11/14/16 0443 11/15/16 0433 11/16/16 0617  Weight: 198 lb 1.6 oz (89.9 kg) 196 lb 11.2 oz (89.2 kg) 202 lb 12.8 oz (92 kg)    Physical Exam    GEN: Well nourished, well developed, in no acute distress.  HEENT: Grossly normal.  Neck: Supple, no JVD, carotid bruits, or masses. Cardiac: Irregularly irregular, II/VI systolic murmur, no rubs, or gallops. No clubbing, cyanosis. Trace pre-tibial edema.  Radials/DP/PT 2+ and equal bilaterally.  Respiratory:  Respirations regular and unlabored, clear to auscultation bilaterally. GI: Soft, nontender, nondistended, BS + x 4. MS: no deformity or atrophy. Skin: warm and dry, no rash. Neuro:  Strength and sensation are intact. Psych: AAOx3.  Normal affect.  Labs    CBC  Recent Labs  11/15/16 0323 11/16/16 0505  WBC 9.9 10.4  HGB 9.6* 9.4*  HCT 28.7* 28.4*  MCV 87.5 86.6  PLT 387 809   Basic Metabolic Panel  Recent Labs  11/15/16 0323 11/16/16 0505  NA 139 138  K 3.7 4.2  CL 96* 97*  CO2 36* 34*  GLUCOSE 114* 95  BUN 52* 57*  CREATININE 1.21* 1.53*  CALCIUM 8.2* 8.6*   Liver Function Tests No results for input(s): AST, ALT, ALKPHOS, BILITOT, PROT, ALBUMIN in the last 72 hours. No results for input(s): LIPASE, AMYLASE in the last 72 hours. Cardiac Enzymes No results for input(s): CKTOTAL, CKMB, CKMBINDEX, TROPONINI in the last 72 hours. BNP Invalid input(s): POCBNP D-Dimer No results for input(s): DDIMER in the last 72 hours. Hemoglobin A1C No results for input(s): HGBA1C in the last 72 hours. Fasting Lipid Panel No results for input(s): CHOL, HDL, LDLCALC, TRIG, CHOLHDL, LDLDIRECT in the last 72 hours. Thyroid Function Tests No results for input(s): TSH, T4TOTAL, T3FREE, THYROIDAB in the last 72 hours.  Invalid input(s): FREET3  Telemetry    Afib, 90s bpm, frequent PVCs - Personally  Reviewed  ECG    n/a - Personally Reviewed  Radiology    No results found.  Cardiac Studies   TTE 11/06/16: Study Conclusions  - Left ventricle: The cavity size was normal. There was mild concentric hypertrophy. Systolic function was normal. The estimated ejection fraction was in the range of 55% to 60%. Wall motion was normal; there were no regional wall motion abnormalities. Features are consistent with a pseudonormal left ventricular filling pattern, with concomitant abnormal relaxation and increased filling pressure (grade 2 diastolic dysfunction). - Aortic valve: There was moderate to severe stenosis. Mean gradient (S): 30 mm Hg. Valve area (VTI): 0.98 cm^2. Valve area (Vmax): 1.05 cm^2. Valve area (Vmean): 1.01 cm^2. - Mitral valve: Severely calcified annulus. There was moderate regurgitation. - Left atrium: The atrium was severely dilated. - Right atrium: The atrium was moderately dilated. - Tricuspid valve: There was moderate regurgitation. - Pulmonary arteries: Systolic pressure was severely increased. PA peak pressure: 70 mm Hg (S).  Patient Profile     82 y.o. female with history of paroxysmal atrial flutter on eliquis, chronic diastolic CHF, COPD on nocturnal home oxygen, hypertension, hypothyroidism, CKD stage II presentedto hospital secondary to worsening shortness of breath.  Assessment & Plan    1. Acute on chronic respiratory failure: -Overall a difficult situation  -Likely multifactorial including acute on chronic diastolic CHF/pulmonary hypertension, anemia requiring one unit of pRBC, and valvular heart disease   2. Acute on chronic diastolic CHF: -Diuresis has been difficult Due to severe pulmonary hypertension and underlying chronic kidney disease. It's very difficult to determine her volume status. I think the best option is to proceed with a right heart catheterization to determine that. I discussed the procedure in details as  well as risk and benefits and we'll proceed tomorrow at 8:30. In the meanwhile, continue current dose of furosemide.  3. Persistent Afib/frequent PVCs: -Rate controlled -Continue Cardizem 120 mg daily -Not on beta blocker as above -Eliquis was stopped earlier this admission 2/2 down trending hgb -Flecainide stopped -Uncertain she would be a candidate for resuming of Eliquis upon discharge -CHADS2VASc at least 6 (CHF, HTN, age x 2, vascular disease, female)  4. Anemia: -Etiology of anemia remains uncertain - stable.   5. AS: -Moderate -Will need outpatient follow up -Uncertain at this time if she would be a surgical candidate down the road   6. Dispo: -Palliative care on board  Signed, Kathlyn [place], MD [facility] 11/16/2016, 9:49 AM

## 2016-11-16 NOTE — Progress Notes (Signed)
Patient ID: Toni [name], female   DOB: [birth year], 82 y.o.   MRN: [id number]    [employer] PROGRESS NOTE  Toni [name] WUJ:[id number] DOB: 03/14/1935 DOA: 11/03/2016 PCP: Pcp Not In System  HPI/Subjective: She is about 7.6 L fluid negative, denies any new symptoms but continues to have shortness of breath.  Does not recall having good bowel movement for quite a few days she does report having a small bowel movement on 27 February  Objective: Vitals:   11/16/16 0629 11/16/16 0720  BP: 139/73 (!) 115/53  Pulse: 85 85  Resp: 16 17  Temp: 97.7 F (36.5 C) 97.5 F (36.4 C)    Filed Weights   11/14/16 0443 11/15/16 0433 11/16/16 0617  Weight: 89.9 kg (198 lb 1.6 oz) 89.2 kg (196 lb 11.2 oz) 92 kg (202 lb 12.8 oz)    ROS: Review of Systems  Constitutional: Negative for chills and fever.  Eyes: Negative for blurred vision.  Respiratory: Positive for cough and shortness of breath.   Cardiovascular: Negative for chest pain.  Gastrointestinal: Negative for abdominal pain, constipation, diarrhea, nausea and vomiting.  Genitourinary: Negative for dysuria.  Musculoskeletal: Negative for joint pain.  Neurological: Negative for dizziness and headaches.   Exam: Physical Exam  Constitutional: She is oriented to person, place, and time.  HENT:  Nose: No mucosal edema.  Mouth/Throat: No oropharyngeal exudate or posterior oropharyngeal edema.  Eyes: Conjunctivae, EOM and lids are normal. Pupils are equal, round, and reactive to light.  Neck: No JVD present. Carotid bruit is not present. No edema present. No thyroid mass and no thyromegaly present.  Cardiovascular: S1 normal and S2 normal.  Exam reveals no gallop.   Murmur heard.  Systolic murmur is present with a grade of 4/6  Pulses:      Dorsalis pedis pulses are 2+ on the right side, and 2+ on the left side.  Respiratory: No respiratory distress. She has decreased breath sounds in the right lower field and the left lower  field. She has no wheezes. She has no rhonchi. She has no rales.  GI: Soft. Bowel sounds are normal. There is no tenderness.  Musculoskeletal:       Right ankle: She exhibits no swelling.       Left ankle: She exhibits no swelling.  Lymphadenopathy:    She has no cervical adenopathy.  Neurological: She is alert and oriented to person, place, and time. No cranial nerve deficit.  Skin: Skin is warm. No rash noted. Nails show no clubbing.  Psychiatric: She has a normal mood and affect.    Data Reviewed: Basic Metabolic Panel:  Recent Labs Lab 11/11/16 0519 11/12/16 0431 11/14/16 0308 11/15/16 0323 11/16/16 0505  NA 140 139 138 139 138  K 4.2 4.3 4.3 3.7 4.2  CL 98* 98* 95* 96* 97*  CO2 32 33* 33* 36* 34*  GLUCOSE 90 101* 109* 114* 95  BUN 59* 58* 55* 52* 57*  CREATININE 1.48* 1.39* 1.28* 1.21* 1.53*  CALCIUM 8.2* 8.1* 8.3* 8.2* 8.6*   CBC:  Recent Labs Lab 11/11/16 0519 11/12/16 0431 11/14/16 0308 11/15/16 0323 11/16/16 0505  WBC  --   --  11.5* 9.9 10.4  HGB 9.2* 9.1* 10.1* 9.6* 9.4*  HCT  --   --  30.9* 28.7* 28.4*  MCV  --   --  86.8 87.5 86.6  PLT  --   --  384 387 375   BNP (last 3 results)  Recent Labs  11/03/16  1625  BNP 338.0*     Scheduled Meds: . sodium chloride   Intravenous Once  . budesonide (PULMICORT) nebulizer solution  0.5 mg Nebulization BID  . calcium carbonate  1 tablet Oral BID  . diltiazem  120 mg Oral Daily  . docusate sodium  100 mg Oral BID  . furosemide  60 mg Intravenous BID  . ipratropium-albuterol  3 mL Nebulization Q6H  . levothyroxine  75 mcg Oral QAC breakfast  . nystatin  5 mL Oral QID  . omega-3 acid ethyl esters  1 g Oral Daily  . pantoprazole  40 mg Oral BID  . polyethylene glycol  17 g Oral Daily  . potassium chloride  30 mEq Oral BID  . predniSONE  50 mg Oral Q breakfast  . rOPINIRole  0.25 mg Oral QHS  . sodium chloride flush  3 mL Intravenous Q12H  . traZODone  50 mg Oral QHS     Assessment/Plan:  1. Acute diastolic congestive heart failure with moderate to severe aortic stenosis, significant pitting edema of lower extremities. Cardiology following.  Continue Lasix IV 60 mg twice daily. continue close monitoring of daily weights and monitor renal function.  Continue Cardizem CD 120 mg daily.  She is -7.6 L of fluid. 2. COPD exacerbation with pneumonia on repeat chest x-ray. Could also be a radiation pneumonitis. Continue   by mouth prednisone  Patient finished Rocephin and Zithromax course. Continue nebulizer treatments.  Very slow to progress.  3.   Acute on chronic respiratory failure secondary to acute on chronic CHF, COPD exacerbation and chronic pulmonary hypertension as well as  anemia. usually only wears oxygen at night. Patient desaturates in the 80s even with oxygen with very limited movement 3. Atrial fibrillation. On 120 mg diltiazem. Holding Eliquis with drop in hemoglobin. Status post 1 unit of blood transfusion. Monitor hemoglobin closely Stopped flecainide with pauses. 4. Restless leg syndrome: Continue Requip 5. Acute kidney injury on chronic kidney disease stage III. Watch closely on IV Lasix. Creatinine at 1.5 pre--1.48-> 1.21 today 6. Weakness. Physical therapy recommends home health PT 7. Hypothyroidism: Continue levothyroxine 8. GERD on Protonix 9. History of lung cancer and lung nodule 10. Anemia with slowly dropping hemoglobin. Anticoagulation on hold. Responded well to blood transfusion. Hemoglobin at 9.4-check Hemoccult stool  11. Constipation-we will add stool softener  Code Status: DNR  Family Communication: none at bedside, also discussed case with Thurmond Butts Disposition Plan: Home with home health PT. may be next 2-3 days depending on her diuresis and kidney function  Consultants:  Cardiology  Antibiotics:  Rocephin  Zithromax finished  Time spent: 33 minutes [place]

## 2016-11-17 ENCOUNTER — Encounter: Payer: Self-pay | Admitting: Cardiovascular Disease

## 2016-11-17 ENCOUNTER — Encounter
Admission: EM | Disposition: A | Discharge status: Home Health | Payer: Self-pay | Source: Home / Self Care | Attending: Internal Medicine

## 2016-11-17 DIAGNOSIS — I481 Persistent atrial fibrillation: Secondary | ICD-10-CM

## 2016-11-17 HISTORY — PX: RIGHT HEART CATH: CATH118263

## 2016-11-17 LAB — PROTIME-INR
INR: 1.22
PROTHROMBIN TIME: 15.5 s — AB (ref 11.4–15.2)

## 2016-11-17 SURGERY — RIGHT HEART CATH
Anesthesia: Moderate Sedation

## 2016-11-17 MED ORDER — FENTANYL CITRATE (PF) 100 MCG/2ML IJ SOLN
INTRAMUSCULAR | Status: DC | PRN
  Administered 2016-11-17 (×2): 25 ug via INTRAVENOUS

## 2016-11-17 MED ORDER — FENTANYL CITRATE (PF) 100 MCG/2ML IJ SOLN
INTRAMUSCULAR | Status: AC
  Filled 2016-11-17: qty 2

## 2016-11-17 SURGICAL SUPPLY — 9 items
CATH BALLN WEDGE 5F 110CM (CATHETERS) ×3 IMPLANT
DEVICE INFLAT 30 PLUS (MISCELLANEOUS) IMPLANT
GLIDESHEATH SLEND SS 6F .021 (SHEATH) ×3 IMPLANT
KIT MANI 3VAL PERCEP (MISCELLANEOUS) ×3 IMPLANT
KIT RIGHT HEART (MISCELLANEOUS) IMPLANT
NEEDLE PERC 18GX7CM (NEEDLE) IMPLANT
PACK CARDIAC CATH (CUSTOM PROCEDURE TRAY) ×3 IMPLANT
SHEATH AVANTI 6FR X 11CM (SHEATH) IMPLANT
WIRE EMERALD 3MM-J .035X150CM (WIRE) IMPLANT

## 2016-11-17 NOTE — Care Management Important Message (Signed)
Important Message  Patient Details  Name: Toni [name] MRN: [id number] Date of Birth: January 02, 1935   Medicare Important Message Given:  Yes    Katrina Stack, RN 11/17/2016, 5:13 PM

## 2016-11-17 NOTE — Interval H&P Note (Signed)
History and Physical Interval Note:  11/17/2016 9:03 AM  Toni [name]  has presented today for surgery, with the diagnosis of volume overload/acute on chronic diastolic congestive heart failure  The various methods of treatment have been discussed with the patient and family. After consideration of risks, benefits and other options for treatment, the patient has consented to  Procedure(s): Right Heart Cath (N/A) as a surgical intervention .  The patient's history has been reviewed, patient examined, no change in status, stable for surgery.  I have reviewed the patient's chart and labs.  Questions were answered to the patient's satisfaction.     Kathlyn Sacramento

## 2016-11-17 NOTE — Progress Notes (Addendum)
Patient ID: Toni [name], female   DOB: 05-18-1935, 82 y.o.   MRN: [id number]    [facility] PROGRESS NOTE  Toni [name] GGY:[id number] DOB: 1935-07-21 DOA: 11/03/2016 PCP: Pcp Not In System  HPI/Subjective: Still short of breath, about 8.2 L fluid negative, underwent right heart catheterization earlier today - husband at bedside.  Objective: Vitals:   11/17/16 0945 11/17/16 1000  BP: (!) 119/48 (!) 115/52  Pulse: 88   Resp: (!) 21 15  Temp:      Filed Weights   11/15/16 0433 11/16/16 0617 11/17/16 0618  Weight: 89.2 kg (196 lb 11.2 oz) 92 kg (202 lb 12.8 oz) 90.3 kg (199 lb 1.6 oz)    ROS: Review of Systems  Constitutional: Negative for chills and fever.  Eyes: Negative for blurred vision.  Respiratory: Positive for cough and shortness of breath.   Cardiovascular: Negative for chest pain.  Gastrointestinal: Negative for abdominal pain, constipation, diarrhea, nausea and vomiting.  Genitourinary: Negative for dysuria.  Musculoskeletal: Negative for joint pain.  Neurological: Negative for dizziness and headaches.   Exam: Physical Exam  Constitutional: She is oriented to person, place, and time.  HENT:  Nose: No mucosal edema.  Mouth/Throat: No oropharyngeal exudate or posterior oropharyngeal edema.  Eyes: Conjunctivae, EOM and lids are normal. Pupils are equal, round, and reactive to light.  Neck: No JVD present. Carotid bruit is not present. No edema present. No thyroid mass and no thyromegaly present.  Cardiovascular: S1 normal and S2 normal.  Exam reveals no gallop.   Murmur heard.  Systolic murmur is present with a grade of 4/6  Pulses:      Dorsalis pedis pulses are 2+ on the right side, and 2+ on the left side.  Respiratory: No respiratory distress. She has decreased breath sounds in the right lower field and the left lower field. She has no wheezes. She has no rhonchi. She has no rales.  GI: Soft. Bowel sounds are normal. There is no tenderness.   Musculoskeletal:       Right ankle: She exhibits no swelling.       Left ankle: She exhibits no swelling.  Lymphadenopathy:    She has no cervical adenopathy.  Neurological: She is alert and oriented to person, place, and time. No cranial nerve deficit.  Skin: Skin is warm. No rash noted. Nails show no clubbing.  Psychiatric: She has a normal mood and affect.    Data Reviewed: Basic Metabolic Panel:  Recent Labs Lab 11/11/16 0519 11/12/16 0431 11/14/16 0308 11/15/16 0323 11/16/16 0505  NA 140 139 138 139 138  K 4.2 4.3 4.3 3.7 4.2  CL 98* 98* 95* 96* 97*  CO2 32 33* 33* 36* 34*  GLUCOSE 90 101* 109* 114* 95  BUN 59* 58* 55* 52* 57*  CREATININE 1.48* 1.39* 1.28* 1.21* 1.53*  CALCIUM 8.2* 8.1* 8.3* 8.2* 8.6*   CBC:  Recent Labs Lab 11/11/16 0519 11/12/16 0431 11/14/16 0308 11/15/16 0323 11/16/16 0505  WBC  --   --  11.5* 9.9 10.4  HGB 9.2* 9.1* 10.1* 9.6* 9.4*  HCT  --   --  30.9* 28.7* 28.4*  MCV  --   --  86.8 87.5 86.6  PLT  --   --  384 387 375   BNP (last 3 results)  Recent Labs  11/03/16 1625  BNP 338.0*     Scheduled Meds: . sodium chloride   Intravenous Once  . budesonide (PULMICORT) nebulizer solution  0.5 mg Nebulization  BID  . calcium carbonate  1 tablet Oral BID  . diltiazem  120 mg Oral Daily  . docusate sodium  100 mg Oral BID  . furosemide  60 mg Intravenous BID  . ipratropium-albuterol  3 mL Nebulization Q6H  . levothyroxine  75 mcg Oral QAC breakfast  . nystatin  5 mL Oral QID  . omega-3 acid ethyl esters  1 g Oral Daily  . pantoprazole  40 mg Oral BID  . polyethylene glycol  17 g Oral Daily  . potassium chloride  30 mEq Oral BID  . predniSONE  50 mg Oral Q breakfast  . rOPINIRole  0.25 mg Oral QHS  . sodium chloride flush  3 mL Intravenous Q12H  . traZODone  50 mg Oral QHS    Assessment/Plan:  1. Acute diastolic congestive heart failure with moderate to severe aortic stenosis, pitting edema of lower extremities. Cardiology  following.  Continue Lasix IV 60 mg twice daily. continue close monitoring of daily weights and monitor renal function.  Continue Cardizem CD 120 mg daily.  She is -8.2 L of fluid.  Underwent right heart catheterization today which showed: Mild to moderately elevated filling pressures with moderate pulmonary hypertension and normal cardiac output. 2. COPD exacerbation with pneumonia. Could also be a radiation pneumonitis. Continue by mouth prednisone  Patient finished Rocephin and Zithromax course. Continue nebulizer treatments.  Very slow to progress.  3.   Acute on chronic respiratory failure secondary to acute on chronic CHF, COPD exacerbation and chronic pulmonary hypertension as well as  anemia. usually only wears oxygen at night. Patient desaturates in the 80s even with oxygen with very limited movement 3. Atrial fibrillation. On 120 mg diltiazem. Holding Eliquis with drop in hemoglobin. Status post 1 unit of blood transfusion. Monitor hemoglobin closely Stopped flecainide with pauses. Unsure if she would be a good candidate to resume her Eliquis on discharge 4. Restless leg syndrome: Continue Requip 5. Acute kidney injury on chronic kidney disease stage III. Watch closely on IV Lasix. Creatinine at 1.5 6. Weakness. Physical therapy recommends home health PT 7. Hypothyroidism: Continue levothyroxine 8. GERD on Protonix 9. History of lung cancer and lung nodule 10. Anemia with slowly dropping hemoglobin. Anticoagulation on hold. Responded well to blood transfusion. Hemoglobin at 9.4-check Hemoccult stool  11. Constipation-stool softener as needed  Code Status: DNR  Family Communication: none at bedside, also discussed case with Thurmond Butts Disposition Plan: Home with home health PT. may be next 1-2 days depending on her clinical condition  Consultants:  Cardiology  Antibiotics:  Rocephin  Zithromax finished  Time spent: 33 minutes [place]

## 2016-11-17 NOTE — Progress Notes (Signed)
Patient Name: Toni [name] Date of Encounter: 11/17/2016  Primary Cardiologist: [facility] Problem List     Active Problems:   Acute on chronic diastolic heart failure (HCC)   Anemia in chronic kidney disease   Cough   PAF (paroxysmal atrial fibrillation) (HCC)   Congestive heart failure (HCC)   Goals of care, counseling/discussion   Palliative care by specialist   Advance care planning     Subjective   SOB about the same. Underwent a right heart cath today. She had difficulty tolerating the procedure due to severe back pain.  Inpatient Medications    Scheduled Meds: . [MAR Hold] sodium chloride   Intravenous Once  . [MAR Hold] budesonide (PULMICORT) nebulizer solution  0.5 mg Nebulization BID  . [MAR Hold] calcium carbonate  1 tablet Oral BID  . [MAR Hold] diltiazem  120 mg Oral Daily  . [MAR Hold] docusate sodium  100 mg Oral BID  . [MAR Hold] furosemide  60 mg Intravenous BID  . [MAR Hold] ipratropium-albuterol  3 mL Nebulization Q6H  . [MAR Hold] levothyroxine  75 mcg Oral QAC breakfast  . [MAR Hold] nystatin  5 mL Oral QID  . [MAR Hold] omega-3 acid ethyl esters  1 g Oral Daily  . [MAR Hold] pantoprazole  40 mg Oral BID  . [MAR Hold] polyethylene glycol  17 g Oral Daily  . [MAR Hold] potassium chloride  30 mEq Oral BID  . [MAR Hold] predniSONE  50 mg Oral Q breakfast  . [MAR Hold] rOPINIRole  0.25 mg Oral QHS  . [MAR Hold] sodium chloride flush  3 mL Intravenous Q12H  . [MAR Hold] traZODone  50 mg Oral QHS   Continuous Infusions:  PRN Meds: [MAR Hold] acetaminophen **OR** [MAR Hold] acetaminophen, [MAR Hold] ALPRAZolam, [MAR Hold] alum & mag hydroxide-simeth, [MAR Hold] diphenhydrAMINE, [MAR Hold] loratadine, [MAR Hold] magnesium hydroxide, [MAR Hold] ondansetron **OR** [MAR Hold] ondansetron (ZOFRAN) IV, [MAR Hold] oxyCODONE-acetaminophen, [MAR Hold] simethicone   Vital Signs    Vitals:   11/17/16 0722 11/17/16 0802 11/17/16 0858 11/17/16 0930    BP:  125/64  115/60  Pulse:  82  90  Resp:  20  20  Temp:  98.1 F (36.7 C)    TempSrc:  Oral    SpO2: 98% 100% 100% 98%  Weight:      Height:        Intake/Output Summary (Last 24 hours) at 11/17/16 0945 Last data filed at 11/17/16 0700  Gross per 24 hour  Intake              840 ml  Output             1400 ml  Net             -560 ml   Filed Weights   11/15/16 0433 11/16/16 0617 11/17/16 0618  Weight: 196 lb 11.2 oz (89.2 kg) 202 lb 12.8 oz (92 kg) 199 lb 1.6 oz (90.3 kg)    Physical Exam    GEN: Well nourished, well developed, in no acute distress.  HEENT: Grossly normal.  Neck: Supple, no JVD, carotid bruits, or masses. Cardiac: Irregularly irregular, II/VI systolic murmur, no rubs, or gallops. No clubbing, cyanosis. Trace pre-tibial edema.  Radials/DP/PT 2+ and equal bilaterally.  Respiratory:  Respirations regular and unlabored, clear to auscultation bilaterally. GI: Soft, nontender, nondistended, BS + x 4. MS: no deformity or atrophy. Skin: warm and dry, no rash. Neuro:  Strength  and sensation are intact. Psych: AAOx3.  Normal affect.  Labs    CBC  Recent Labs  11/15/16 0323 11/16/16 0505  WBC 9.9 10.4  HGB 9.6* 9.4*  HCT 28.7* 28.4*  MCV 87.5 86.6  PLT 387 962   Basic Metabolic Panel  Recent Labs  11/15/16 0323 11/16/16 0505  NA 139 138  K 3.7 4.2  CL 96* 97*  CO2 36* 34*  GLUCOSE 114* 95  BUN 52* 57*  CREATININE 1.21* 1.53*  CALCIUM 8.2* 8.6*   Liver Function Tests No results for input(s): AST, ALT, ALKPHOS, BILITOT, PROT, ALBUMIN in the last 72 hours. No results for input(s): LIPASE, AMYLASE in the last 72 hours. Cardiac Enzymes No results for input(s): CKTOTAL, CKMB, CKMBINDEX, TROPONINI in the last 72 hours. BNP Invalid input(s): POCBNP D-Dimer No results for input(s): DDIMER in the last 72 hours. Hemoglobin A1C No results for input(s): HGBA1C in the last 72 hours. Fasting Lipid Panel No results for input(s): CHOL, HDL,  LDLCALC, TRIG, CHOLHDL, LDLDIRECT in the last 72 hours. Thyroid Function Tests No results for input(s): TSH, T4TOTAL, T3FREE, THYROIDAB in the last 72 hours.  Invalid input(s): FREET3  Telemetry    Afib, 90s bpm, frequent PVCs - Personally Reviewed  ECG    n/a - Personally Reviewed  Radiology    No results found.  Cardiac Studies   TTE 11/06/16: Study Conclusions  - Left ventricle: The cavity size was normal. There was mild concentric hypertrophy. Systolic function was normal. The estimated ejection fraction was in the range of 55% to 60%. Wall motion was normal; there were no regional wall motion abnormalities. Features are consistent with a pseudonormal left ventricular filling pattern, with concomitant abnormal relaxation and increased filling pressure (grade 2 diastolic dysfunction). - Aortic valve: There was moderate to severe stenosis. Mean gradient (S): 30 mm Hg. Valve area (VTI): 0.98 cm^2. Valve area (Vmax): 1.05 cm^2. Valve area (Vmean): 1.01 cm^2. - Mitral valve: Severely calcified annulus. There was moderate regurgitation. - Left atrium: The atrium was severely dilated. - Right atrium: The atrium was moderately dilated. - Tricuspid valve: There was moderate regurgitation. - Pulmonary arteries: Systolic pressure was severely increased. PA peak pressure: 70 mm Hg (S).  Patient Profile     82 y.o. female with history of paroxysmal atrial flutter on eliquis, chronic diastolic CHF, COPD on nocturnal home oxygen, hypertension, hypothyroidism, CKD stage II presentedto hospital secondary to worsening shortness of breath.  Assessment & Plan    1. Acute on chronic respiratory failure: -Overall a difficult situation  -Likely multifactorial including acute on chronic diastolic CHF/pulmonary hypertension, anemia requiring one unit of pRBC, and valvular heart disease   2. Acute on chronic diastolic CHF: - Right heart cath today showed: Mild to  moderately elevated filling pressures with moderate pulmonary hypertension and normal cardiac output.  RA: 10 mmHg RV : 36 / 6 mmHg PA: 44/19 with a mean of 31 mmHg Pulmonary capillary wedge pressure: 22 mmHg with a prominent V wave suggestive of significant mitral regurgitation Cardiac output was 5.92 with a cardiac index of 2.96  Continue IV furosemide for at least another day. These numbers do not fully explain her degree of shortness of breath which I suspect is multifactorial due to  cardiac and pulmonary disease.  The patient likely has significant aortic stenosis and mitral regurgitation but I doubt that she will be a good candidate for dual valve surgery or even TAVR alone.  I suspect that she has significant pulmonary disease  related to prolonged history of smoking.  I discussed the findings with the patient and husband.   3. Persistent Afib/frequent PVCs: -Rate controlled -Continue Cardizem 120 mg daily -Not on beta blocker as above -Eliquis was stopped earlier this admission 2/2 down trending hgb -Flecainide stopped -Uncertain she would be a candidate for resuming of Eliquis upon discharge -CHADS2VASc at least 6 (CHF, HTN, age x 2, vascular disease, female)  4. Anemia: -Etiology of anemia remains uncertain - stable.     Signed, Kathlyn Sacramento, MD [facility] 11/17/2016, 9:45 AM

## 2016-11-17 NOTE — Progress Notes (Signed)
Patient remains off unit for cardiac procedure. Will resume care upon return. Wenda Low [facility]

## 2016-11-17 NOTE — Care Management (Addendum)
Updated [facility] on patient status.  CM found that palliative care has made a referral to [facility] out of [facility].

## 2016-11-17 NOTE — H&P (View-Only) (Signed)
Patient Name: Toni [name] Date of Encounter: 11/16/2016  Primary Cardiologist: [facility] Problem List     Active Problems:   Acute on chronic diastolic heart failure (HCC)   Anemia in chronic kidney disease   Cough   PAF (paroxysmal atrial fibrillation) (HCC)   Congestive heart failure (HCC)   Goals of care, counseling/discussion   Palliative care by specialist   Advance care planning     Subjective   SOB about the same. Remains in 2 L via nasal cannula.  Lasix was increased yesterday to 60 mg IV twice daily but her weight actually went up.   Inpatient Medications    Scheduled Meds: . sodium chloride   Intravenous Once  . budesonide (PULMICORT) nebulizer solution  0.5 mg Nebulization BID  . calcium carbonate  1 tablet Oral BID  . diltiazem  120 mg Oral Daily  . docusate sodium  100 mg Oral BID  . furosemide  60 mg Intravenous BID  . ipratropium-albuterol  3 mL Nebulization Q6H  . levothyroxine  75 mcg Oral QAC breakfast  . nystatin  5 mL Oral QID  . omega-3 acid ethyl esters  1 g Oral Daily  . pantoprazole  40 mg Oral BID  . polyethylene glycol  17 g Oral Daily  . potassium chloride  30 mEq Oral BID  . predniSONE  50 mg Oral Q breakfast  . rOPINIRole  0.25 mg Oral QHS  . sodium chloride flush  3 mL Intravenous Q12H  . traZODone  50 mg Oral QHS   Continuous Infusions:  PRN Meds: acetaminophen **OR** acetaminophen, ALPRAZolam, alum & mag hydroxide-simeth, diphenhydrAMINE, loratadine, magnesium hydroxide, ondansetron **OR** ondansetron (ZOFRAN) IV, oxyCODONE-acetaminophen, simethicone   Vital Signs    Vitals:   11/16/16 0238 11/16/16 0617 11/16/16 0629 11/16/16 0720  BP:   139/73 (!) 115/53  Pulse:   85 85  Resp:   16 17  Temp:   97.7 F (36.5 C) 97.5 F (36.4 C)  TempSrc:   Oral Oral  SpO2: 99%  100% 99%  Weight:  202 lb 12.8 oz (92 kg)    Height:        Intake/Output Summary (Last 24 hours) at 11/16/16 0949 Last data filed at 11/15/16  1300  Gross per 24 hour  Intake              600 ml  Output                0 ml  Net              600 ml   Filed Weights   11/14/16 0443 11/15/16 0433 11/16/16 0617  Weight: 198 lb 1.6 oz (89.9 kg) 196 lb 11.2 oz (89.2 kg) 202 lb 12.8 oz (92 kg)    Physical Exam    GEN: Well nourished, well developed, in no acute distress.  HEENT: Grossly normal.  Neck: Supple, no JVD, carotid bruits, or masses. Cardiac: Irregularly irregular, II/VI systolic murmur, no rubs, or gallops. No clubbing, cyanosis. Trace pre-tibial edema.  Radials/DP/PT 2+ and equal bilaterally.  Respiratory:  Respirations regular and unlabored, clear to auscultation bilaterally. GI: Soft, nontender, nondistended, BS + x 4. MS: no deformity or atrophy. Skin: warm and dry, no rash. Neuro:  Strength and sensation are intact. Psych: AAOx3.  Normal affect.  Labs    CBC  Recent Labs  11/15/16 0323 11/16/16 0505  WBC 9.9 10.4  HGB 9.6* 9.4*  HCT 28.7* 28.4*  MCV 87.5 86.6  PLT 387 594   Basic Metabolic Panel  Recent Labs  11/15/16 0323 11/16/16 0505  NA 139 138  K 3.7 4.2  CL 96* 97*  CO2 36* 34*  GLUCOSE 114* 95  BUN 52* 57*  CREATININE 1.21* 1.53*  CALCIUM 8.2* 8.6*   Liver Function Tests No results for input(s): AST, ALT, ALKPHOS, BILITOT, PROT, ALBUMIN in the last 72 hours. No results for input(s): LIPASE, AMYLASE in the last 72 hours. Cardiac Enzymes No results for input(s): CKTOTAL, CKMB, CKMBINDEX, TROPONINI in the last 72 hours. BNP Invalid input(s): POCBNP D-Dimer No results for input(s): DDIMER in the last 72 hours. Hemoglobin A1C No results for input(s): HGBA1C in the last 72 hours. Fasting Lipid Panel No results for input(s): CHOL, HDL, LDLCALC, TRIG, CHOLHDL, LDLDIRECT in the last 72 hours. Thyroid Function Tests No results for input(s): TSH, T4TOTAL, T3FREE, THYROIDAB in the last 72 hours.  Invalid input(s): FREET3  Telemetry    Afib, 90s bpm, frequent PVCs - Personally  Reviewed  ECG    n/a - Personally Reviewed  Radiology    No results found.  Cardiac Studies   TTE 11/06/16: Study Conclusions  - Left ventricle: The cavity size was normal. There was mild concentric hypertrophy. Systolic function was normal. The estimated ejection fraction was in the range of 55% to 60%. Wall motion was normal; there were no regional wall motion abnormalities. Features are consistent with a pseudonormal left ventricular filling pattern, with concomitant abnormal relaxation and increased filling pressure (grade 2 diastolic dysfunction). - Aortic valve: There was moderate to severe stenosis. Mean gradient (S): 30 mm Hg. Valve area (VTI): 0.98 cm^2. Valve area (Vmax): 1.05 cm^2. Valve area (Vmean): 1.01 cm^2. - Mitral valve: Severely calcified annulus. There was moderate regurgitation. - Left atrium: The atrium was severely dilated. - Right atrium: The atrium was moderately dilated. - Tricuspid valve: There was moderate regurgitation. - Pulmonary arteries: Systolic pressure was severely increased. PA peak pressure: 70 mm Hg (S).  Patient Profile     82 y.o. female with history of paroxysmal atrial flutter on eliquis, chronic diastolic CHF, COPD on nocturnal home oxygen, hypertension, hypothyroidism, CKD stage II presentedto hospital secondary to worsening shortness of breath.  Assessment & Plan    1. Acute on chronic respiratory failure: -Overall a difficult situation  -Likely multifactorial including acute on chronic diastolic CHF/pulmonary hypertension, anemia requiring one unit of pRBC, and valvular heart disease   2. Acute on chronic diastolic CHF: -Diuresis has been difficult Due to severe pulmonary hypertension and underlying chronic kidney disease. It's very difficult to determine her volume status. I think the best option is to proceed with a right heart catheterization to determine that. I discussed the procedure in details as  well as risk and benefits and we'll proceed tomorrow at 8:30. In the meanwhile, continue current dose of furosemide.  3. Persistent Afib/frequent PVCs: -Rate controlled -Continue Cardizem 120 mg daily -Not on beta blocker as above -Eliquis was stopped earlier this admission 2/2 down trending hgb -Flecainide stopped -Uncertain she would be a candidate for resuming of Eliquis upon discharge -CHADS2VASc at least 6 (CHF, HTN, age x 2, vascular disease, female)  4. Anemia: -Etiology of anemia remains uncertain - stable.   5. AS: -Moderate -Will need outpatient follow up -Uncertain at this time if she would be a surgical candidate down the road   6. Dispo: -Palliative care on board  Signed, Kathlyn Sacramento, MD [facility] 11/16/2016, 9:49 AM

## 2016-11-18 DIAGNOSIS — Z7189 Other specified counseling: Secondary | ICD-10-CM

## 2016-11-18 LAB — CBC
HCT: 27 % — ABNORMAL LOW (ref 35.0–47.0)
Hemoglobin: 8.8 g/dL — ABNORMAL LOW (ref 12.0–16.0)
MCH: 28.3 pg (ref 26.0–34.0)
MCHC: 32.4 g/dL (ref 32.0–36.0)
MCV: 87.4 fL (ref 80.0–100.0)
PLATELETS: 302 10*3/uL (ref 150–440)
RBC: 3.09 MIL/uL — AB (ref 3.80–5.20)
RDW: 16.8 % — AB (ref 11.5–14.5)
WBC: 7.9 10*3/uL (ref 3.6–11.0)

## 2016-11-18 LAB — BASIC METABOLIC PANEL
ANION GAP: 5 (ref 5–15)
BUN: 50 mg/dL — ABNORMAL HIGH (ref 6–20)
CALCIUM: 8 mg/dL — AB (ref 8.9–10.3)
CO2: 33 mmol/L — ABNORMAL HIGH (ref 22–32)
Chloride: 100 mmol/L — ABNORMAL LOW (ref 101–111)
Creatinine, Ser: 1.32 mg/dL — ABNORMAL HIGH (ref 0.44–1.00)
GFR, EST AFRICAN AMERICAN: 43 mL/min — AB (ref >60)
GFR, EST NON AFRICAN AMERICAN: 37 mL/min — AB (ref >60)
Glucose, Bld: 88 mg/dL (ref 65–99)
POTASSIUM: 4.4 mmol/L (ref 3.5–5.1)
SODIUM: 138 mmol/L (ref 135–145)

## 2016-11-18 NOTE — Progress Notes (Signed)
Patient ID: Toni [name], female   DOB: Aug 19, 1935, 82 y.o.   MRN: [id number]    [facility] PROGRESS NOTE  [place]A SNK:[id number] DOB: 1935-09-16 DOA: 11/03/2016 PCP: Pcp Not In System  HPI/Subjective: She is about 7.5 L fluid negative, creatinine improved.  She is still feeling quite short of breath while sitting in chair. her husband is at bedside  Objective: Vitals:   11/18/16 0436 11/18/16 1126  BP: (!) 114/49 132/62  Pulse: 93 97  Resp: 16 18  Temp: 98.3 F (36.8 C) 98 F (36.7 C)    Filed Weights   11/16/16 0617 11/17/16 0618 11/18/16 0435  Weight: 92 kg (202 lb 12.8 oz) 90.3 kg (199 lb 1.6 oz) 89.1 kg (196 lb 8 oz)    ROS: Review of Systems  Constitutional: Negative for chills and fever.  Eyes: Negative for blurred vision.  Respiratory: Positive for cough and shortness of breath.   Cardiovascular: Negative for chest pain.  Gastrointestinal: Negative for abdominal pain, constipation, diarrhea, nausea and vomiting.  Genitourinary: Negative for dysuria.  Musculoskeletal: Negative for joint pain.  Neurological: Negative for dizziness and headaches.   Exam: Physical Exam  Constitutional: She is oriented to person, place, and time.  HENT:  Nose: No mucosal edema.  Mouth/Throat: No oropharyngeal exudate or posterior oropharyngeal edema.  Eyes: Conjunctivae, EOM and lids are normal. Pupils are equal, round, and reactive to light.  Neck: No JVD present. Carotid bruit is not present. No edema present. No thyroid mass and no thyromegaly present.  Cardiovascular: S1 normal and S2 normal.  Exam reveals no gallop.   Murmur heard.  Systolic murmur is present with a grade of 4/6  Pulses:      Dorsalis pedis pulses are 2+ on the right side, and 2+ on the left side.  Respiratory: No respiratory distress. She has decreased breath sounds in the right lower field and the left lower field. She has no wheezes. She has no rhonchi. She has no rales.  GI: Soft. Bowel  sounds are normal. There is no tenderness.  Musculoskeletal:       Right ankle: She exhibits swelling.       Left ankle: She exhibits swelling.  Lymphadenopathy:    She has no cervical adenopathy.  Neurological: She is alert and oriented to person, place, and time. No cranial nerve deficit.  Skin: Skin is warm. No rash noted. Nails show no clubbing.  Psychiatric: She has a normal mood and affect.      Data Reviewed: Basic Metabolic Panel:  Recent Labs Lab 11/12/16 0431 11/14/16 0308 11/15/16 0323 11/16/16 0505 11/18/16 0506  NA 139 138 139 138 138  K 4.3 4.3 3.7 4.2 4.4  CL 98* 95* 96* 97* 100*  CO2 33* 33* 36* 34* 33*  GLUCOSE 101* 109* 114* 95 88  BUN 58* 55* 52* 57* 50*  CREATININE 1.39* 1.28* 1.21* 1.53* 1.32*  CALCIUM 8.1* 8.3* 8.2* 8.6* 8.0*   CBC:  Recent Labs Lab 11/12/16 0431 11/14/16 0308 11/15/16 0323 11/16/16 0505 11/18/16 0506  WBC  --  11.5* 9.9 10.4 7.9  HGB 9.1* 10.1* 9.6* 9.4* 8.8*  HCT  --  30.9* 28.7* 28.4* 27.0*  MCV  --  86.8 87.5 86.6 87.4  PLT  --  384 387 375 302   BNP (last 3 results)  Recent Labs  11/03/16 1625  BNP 338.0*     Scheduled Meds: . sodium chloride   Intravenous Once  . budesonide (PULMICORT) nebulizer solution  0.5 mg Nebulization BID  . calcium carbonate  1 tablet Oral BID  . diltiazem  120 mg Oral Daily  . docusate sodium  100 mg Oral BID  . furosemide  60 mg Intravenous BID  . ipratropium-albuterol  3 mL Nebulization Q6H  . levothyroxine  75 mcg Oral QAC breakfast  . nystatin  5 mL Oral QID  . omega-3 acid ethyl esters  1 g Oral Daily  . pantoprazole  40 mg Oral BID  . polyethylene glycol  17 g Oral Daily  . potassium chloride  30 mEq Oral BID  . predniSONE  50 mg Oral Q breakfast  . rOPINIRole  0.25 mg Oral QHS  . sodium chloride flush  3 mL Intravenous Q12H  . traZODone  50 mg Oral QHS    Assessment/Plan:  1. Acute diastolic congestive heart failure with moderate to severe aortic stenosis,  Continue IV Lasix today. Cardiology is following.\\ 2.  3. COPD exacerbation with pneumonia on repeat chest x-ray. Could also be a radiation pneumonitis. Continue   by mouth prednisone  Patient finished Rocephin and Zithromax course. Continue nebulizer treatments.  Very slow to progress. Lungs are clear today. 4.  3.   Acute on chronic respiratory failure secondary to acute on chronic CHF, valvular heart disease: COPD exacerbation and chronic pulmonary hypertension as well as anemia. usually only wears oxygen at night. Patient desaturates in the 80s even with oxygen with very limited movement, continue oxygen. 5. Atrial fibrillation. On 120 mg diltiazem. Hold eliquis,. lreceived 1 unit of transfusion.  6.  7.  8. Restless leg syndrome. Improved with Requip 9.  10. Acute kidney injury on chronic kidney disease stage III. Monitor closely. 11.  12.  13. Weakness. Physical therapy recommends home health PT 14.  15. Hypothyroidism unspecified on levothyroxine 16.  17. GERD on Protonix 18.  19. History of lung cancer and lung nodule 20.  21. Anemia with slowly dropping hemoglobin. Anticoagulation on hold. Responded well to blood transfusion. Hemoglobin at 10.1 -check Hemoccult stool  22.  23. Constipation- resolved  Code Status: DNR  Family Communication: Husband Today, also discussed case with Dr. Rockey Situ Disposition Plan: Home with home health PT. may be next 2-3 days depending on her diuresis and kidney function  Consultants:  Cardiology  Antibiotics:  Rocephin  Zithromax finished  Time spent: 33 minutes [employer]

## 2016-11-18 NOTE — Progress Notes (Signed)
Patient Name: Toni [name] Date of Encounter: 11/18/2016  Primary Cardiologist: [facility] Problem List     Active Problems:   Acute on chronic diastolic heart failure (HCC)   Anemia in chronic kidney disease   Cough   PAF (paroxysmal atrial fibrillation) (HCC)   Congestive heart failure (HCC)   Goals of care, counseling/discussion   Palliative care by specialist   Advance care planning     Subjective   Right heart catheterization results reviewed with her in detail PA pressure 50, wedge 22 Still with lower extremity edema, shortness of breath on oxygen Has not ambulated very much secondary to symptoms, leg weakness Reports that she walked with physical therapy once  Inpatient Medications    Scheduled Meds: . sodium chloride   Intravenous Once  . budesonide (PULMICORT) nebulizer solution  0.5 mg Nebulization BID  . calcium carbonate  1 tablet Oral BID  . diltiazem  120 mg Oral Daily  . docusate sodium  100 mg Oral BID  . furosemide  60 mg Intravenous BID  . ipratropium-albuterol  3 mL Nebulization Q6H  . levothyroxine  75 mcg Oral QAC breakfast  . nystatin  5 mL Oral QID  . omega-3 acid ethyl esters  1 g Oral Daily  . pantoprazole  40 mg Oral BID  . polyethylene glycol  17 g Oral Daily  . potassium chloride  30 mEq Oral BID  . predniSONE  50 mg Oral Q breakfast  . rOPINIRole  0.25 mg Oral QHS  . sodium chloride flush  3 mL Intravenous Q12H  . traZODone  50 mg Oral QHS   Continuous Infusions:  PRN Meds: acetaminophen **OR** acetaminophen, ALPRAZolam, alum & mag hydroxide-simeth, diphenhydrAMINE, loratadine, magnesium hydroxide, ondansetron **OR** ondansetron (ZOFRAN) IV, oxyCODONE-acetaminophen, simethicone   Vital Signs    Vitals:   11/18/16 0436 11/18/16 0835 11/18/16 1126 11/18/16 1355  BP: (!) 114/49  132/62   Pulse: 93  97   Resp: 16  18   Temp: 98.3 F (36.8 C)  98 F (36.7 C)   TempSrc:   Oral   SpO2: 99% 100% 100% 98%  Weight:        Height:        Intake/Output Summary (Last 24 hours) at 11/18/16 1712 Last data filed at 11/18/16 1515  Gross per 24 hour  Intake              600 ml  Output             2910 ml  Net            -2310 ml   Filed Weights   11/16/16 0617 11/17/16 0618 11/18/16 0435  Weight: 202 lb 12.8 oz (92 kg) 199 lb 1.6 oz (90.3 kg) 196 lb 8 oz (89.1 kg)    Physical Exam    GEN: Well nourished, well developed, in no acute distress.  HEENT: Grossly normal.  Neck: Supple, no JVD, carotid bruits, or masses. Cardiac: Irregularly irregular, II/VI systolic murmur, no rubs, or gallops. No clubbing, cyanosis.  1+ pitting edema to the knees bilaterally  Radials/DP/PT 2+ and equal bilaterally.  Respiratory:  Coarse breath sounds bilaterally, worse at the bases GI: Soft, nontender, nondistended, BS + x 4. MS: no deformity or atrophy. Skin: warm and dry, no rash. Neuro:  Strength and sensation are intact. Psych: AAOx3.  Normal affect.  Labs    CBC  Recent Labs  11/16/16 0505 11/18/16 0506  WBC 10.4 7.9  HGB 9.4* 8.8*  HCT 28.4* 27.0*  MCV 86.6 87.4  PLT 375 540   Basic Metabolic Panel  Recent Labs  11/16/16 0505 11/18/16 0506  NA 138 138  K 4.2 4.4  CL 97* 100*  CO2 34* 33*  GLUCOSE 95 88  BUN 57* 50*  CREATININE 1.53* 1.32*  CALCIUM 8.6* 8.0*   Liver Function Tests No results for input(s): AST, ALT, ALKPHOS, BILITOT, PROT, ALBUMIN in the last 72 hours. No results for input(s): LIPASE, AMYLASE in the last 72 hours. Cardiac Enzymes No results for input(s): CKTOTAL, CKMB, CKMBINDEX, TROPONINI in the last 72 hours. BNP Invalid input(s): POCBNP D-Dimer No results for input(s): DDIMER in the last 72 hours. Hemoglobin A1C No results for input(s): HGBA1C in the last 72 hours. Fasting Lipid Panel No results for input(s): CHOL, HDL, LDLCALC, TRIG, CHOLHDL, LDLDIRECT in the last 72 hours. Thyroid Function Tests No results for input(s): TSH, T4TOTAL, T3FREE, THYROIDAB in the last  72 hours.  Invalid input(s): FREET3  Telemetry    Afib, 70's bpm, frequent PVCs, occasionally in a pattern of ventricular bigeminy - Personally Reviewed  ECG    n/a - Personally Reviewed  Radiology    No results found.  Cardiac Studies   TTE 11/06/16: Study Conclusions  - Left ventricle: The cavity size was normal. There was mild concentric hypertrophy. Systolic function was normal. The estimated ejection fraction was in the range of 55% to 60%. Wall motion was normal; there were no regional wall motion abnormalities. Features are consistent with a pseudonormal left ventricular filling pattern, with concomitant abnormal relaxation and increased filling pressure (grade 2 diastolic dysfunction). - Aortic valve: There was moderate to severe stenosis. Mean gradient (S): 30 mm Hg. Valve area (VTI): 0.98 cm^2. Valve area (Vmax): 1.05 cm^2. Valve area (Vmean): 1.01 cm^2. - Mitral valve: Severely calcified annulus. There was moderate regurgitation. - Left atrium: The atrium was severely dilated. - Right atrium: The atrium was moderately dilated. - Tricuspid valve: There was moderate regurgitation. - Pulmonary arteries: Systolic pressure was severely increased. PA peak pressure: 70 mm Hg (S).  Patient Profile     82 y.o. female with history of paroxysmal atrial flutter on eliquis, chronic diastolic CHF, COPD on nocturnal home oxygen, hypertension, hypothyroidism, CKD stage II presentedto hospital secondary to worsening shortness of breath.  Assessment & Plan    1. Acute on chronic respiratory failure:  acute on chronic diastolic CHF/pulmonary hypertension, anemia  and valvular heart disease, severe COPD Would continue IV Lasix twice a day Stable renal function  2. Acute on chronic diastolic CHF: Right heart catheterization with elevated wedge pressure, PA pressure Moderately elevated -Not on beta blocker given pulmonary status  Continue IV Lasix  bid Suspect will take additional 2 days of diuresis  3. Persistent Afib/frequent PVCs: -Rate controlled -Continue Cardizem 120 mg daily -Eliquis was stopped earlier this admission 2/2 down trending hgb -Uncertain if she will be a candidate for resuming Eliquis upon discharge given anemia -CHADS2VASc at least 6 (CHF, HTN, age x 2, vascular disease, female)  4. Anemia: -Status post 1 unit pRBC this admission Do not see any stool guaiacs  5. AS: -Moderate  outpatient follow up  6. COPD Long history of smoking, likely contributing to underlying pulmonary hypertension   Esmond Plants  M.D., Ph.D. [facility]

## 2016-11-19 MED ORDER — ALPRAZOLAM 0.25 MG PO TABS: 0.2500 mg | ORAL_TABLET | Freq: Every evening | ORAL | 0 refills | Status: DC | PRN

## 2016-11-19 MED ORDER — NYSTATIN 100000 UNIT/ML MT SUSP: 5.0000 mL | Freq: Four times a day (QID) | OROMUCOSAL | 0 refills | Status: DC

## 2016-11-19 MED ORDER — PREDNISONE 10 MG (21) PO TBPK: ORAL_TABLET | ORAL | 0 refills | Status: DC

## 2016-11-19 MED ORDER — TORSEMIDE 20 MG PO TABS: 40.0000 mg | ORAL_TABLET | Freq: Two times a day (BID) | ORAL | 0 refills | Status: DC

## 2016-11-19 NOTE — Progress Notes (Signed)
Patient given discharge teaching and paperwork regarding medications, diet, follow-up appointments and activity. Patient understanding verbalized. No complaints at this time. IV and telemetry discontinued prior to leaving. Skin assessment as previously charted and vitals are stable; on room air. Patient being discharged to home. Caregiver/family present during discharge teaching. No further needs by Care Management. Prescriptions sent to pharmacy and 1 signed script handed to patient.

## 2016-11-19 NOTE — Care Management Note (Addendum)
Case Management Note  Patient Details  Name: Toni DEJOSEPH MRN: [id number] Date of Birth: May 05, 1935  Subjective/Objective:      [facility] and spoke with Eula Fried, RN, who states that there is no [facility] and Palliative Care, that their name is [facility]. Bambi RN who spoke with Palliative Care at [facility] Mariana Kaufman, NP, Progress Note on 11/14/16 at 11:21am) is an employee of [facility]. Judeen Hammans reports that they do not provide Palliative Care and if they did that it would be a duplication of services for them to provide home services for a client which a home health agency was also providing care. Therefore, it was agreed that the referral for home health PT and RN would be sent to [facility] only by this Probation officer.. All home health orders and discharge information was faxed to [facility].               Action/Plan:   Expected Discharge Date:  11/19/16               Expected Discharge Plan:  [place]  In-House Referral:     Discharge planning Services  CM Consult  Post Acute Care Choice:    Choice offered to:  Patient  DME Arranged:    DME Agency:  [facility] (has the chronic 02 and will make sure patient gets a Marine scientist)  HH Arranged:  RN, PT, Nurse's Aide [place] Agency:  [place]  Status of Service:  Completed, signed off  If discussed at [place] of Stay Meetings, dates discussed:    Additional Comments:  Jai Bear A, RN 11/19/2016, 1:04 PM

## 2016-11-19 NOTE — Discharge Summary (Signed)
Toni [name], is a 82 y.o. female  DOB 08/26/1935  MRN [id number].  Admission date:  11/03/2016  Admitting Physician  Gladstone Lighter, MD  Discharge Date:  11/19/2016   Primary MD  Pcp Not In System  Recommendations for primary care physician for things to follow:   Follow with [facility] cardiology in one week. Follow with CHF clinic.   Admission Diagnosis  COPD exacerbation (HCC) [J44.1] Congestive heart failure, unspecified congestive heart failure chronicity, unspecified congestive heart failure type ([place]) [I50.9] Community acquired pneumonia, unspecified laterality [J18.9]   Discharge Diagnosis  COPD exacerbation (HCC) [J44.1] Congestive heart failure, unspecified congestive heart failure chronicity, unspecified congestive heart failure type ([place]) [I50.9] Community acquired pneumonia, unspecified laterality [J18.9]   Active Problems:   Acute on chronic diastolic heart failure (HCC)   Anemia in chronic kidney disease   Cough   PAF (paroxysmal atrial fibrillation) (HCC)   Congestive heart failure (HCC)   Goals of care, counseling/discussion   Palliative care by specialist   Advance care planning      Past Medical History:  Diagnosis Date  . Atypical atrial flutter ([place]) 11/16/2013   a. CHA2DS2VASc = 5-->prev on xarelto, d/c'd due to bleeding skin tear;  c. 11/2013->Placed on flecainide.  . Cataract   . Chronic diastolic CHF (congestive heart failure) ([place])    a. 11/2013 Echo: EF 55-60%, mild AS/MR, mod dil LA/RA, PASP 34mHg.  .Marland KitchenChronic diastolic heart failure ([place]   . Collagen vascular disease ([place]   . COPD (chronic obstructive pulmonary disease) ([place]   . Essential hypertension, benign   . Hypokalemia   . Hypothyroidism   . 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 Lung cancer ([place]    . Lung nodule    Followed by Dr. FRaul Del . PAF (paroxysmal atrial fibrillation) ([place]    a. 11/2013 Echo: EF 55-60%, mild AS/MR, mod dil LA/RA, PASP 363mg;  b. CHA2DS2VASc = 5-->prev on xarelto, d/c'd due to bleeding skin tear;  c. 11/2013->Placed on flecainide.    Past Surgical History:  Procedure Laterality Date  . APPENDECTOMY    . EYE SURGERY    . RIGHT HEART CATH N/A 11/17/2016   Procedure: Right Heart Cath;  Surgeon: MuWellington HampshireMD;  [facility];  Service: Cardiovascular;  Laterality: N/A;  . TOTAL KNEE ARTHROPLASTY Bilateral        History of present illness and  Hospital Course:     Kindly see H&P for history of present illness and admission details, please review complete Labs, Consult reports and Test reports for all details in brief  HPI  from the history and physical done on the day of admission [age]ear old female patient with  history of proximal atrial fibrillation, chronic diastolic heart failure, COPD, essential hypertension, hypothyroidism, CK D states to comes in with shortness of breath, admitted for acute on chronic diastolic heart failure.   Hospital Course   #1 acute on chronic diastolic heart failure: Admitted to telemetry, started on IV Lasix, cardiology is consulted. Assessment on now Lasix 80 mg IV twice a day since admission, even though she is on now higher dose Lasix continued to be having shortness of breath, weight gain, seen by cardiology and taken to the right heart catheterization for evaluation of pulmonary hypertension. Patient is almost 10 L negative balance since admission.  plan  to discharge her home with torsemide 40 mg twice a day instead of Lasix. Plan discussed  with cardiology. Continue potassium supplements also.   #2 proximal atrial fibrillation: Patient is on Cardizem CD 120 mg daily. Patient had's all this on telemetry so flecainide was stopped. Patient was on flecainide at home but we stopped because of  pauses. Patient  eliquis   also he stopped because of drop in hemoglobin, as the patient not to take in liquids at discharge. Unable to use beta blockers because of pulmonary hypertension, history of COPD.  Persistent PVCs: Continue Cardizem 120 daily, flecainide is stopped. -CHADS2VASc at least 6 (CHF, HTN, age x 2, vascular disease, female Needs outpatient hematology appointment, spoke with Dr. Esmond Plants he will the set that up tomorrow. Advised the patient to stop the epixiban.   #3 acute kidney injury on chronic kidney disease stage III: Patient creatinine bumped up to 1.5 up to 1.32. #4 COPD exacerbation: Patient received Rocephin, Zithromax, IV steroids, lungs are clear, no wheezing. Continue prednisone Dosepak, nebulizer at home.  #5  RLS: Improved with Requip.  #6 weakness physical therapy recommends home health physical therapy  #6 GERD continue PPIs  7.History of lung cancer and lung nodule  #8. anemia patient anticoagulation on hold. Received/needed blood transfusion, hemoglobin stable around 8.8.  #9 goals of care discussed with patient, patient now DO NOT RESUSCITATE.  #10: Acute on chronic respiratory failure: Multifactorial secondary to acute on chronic diastolic heart failure, pulmonary hypertension, well or heart disease, patient had elevated pulmonary artery pressure of 31 mm, with pulmonary capillary wedge pressure with 22 mm with severe mitral regurgitation.  Husband  requested anxiety medicine for patient.wrote xanax.  Prognosis poor.  Discharge Condition: stable   Follow UP  Follow-up Information    Alisa Graff, FNP. Go on 11/23/2016.   Specialty:  Family Medicine Why:  at 9:00am to the Heart Failure Clinic Contact information: [address] [place] [id number] [place] Follow up.   Why:  HOme health nurse, physical therapy aide Contact information:  [phone] [place] in [place] Follow up.   Why:  Will provide outaptient palliative care services i the home.   Contact nurse Bambi 1 [place], MD Follow up in 1 week(s).   Specialty:  Cardiology Contact information: [place] [address] [phone]        Cammie Sickle, MD Follow up in 1 week(s).   Specialties:  Internal Medicine, Oncology Why:  please give phone number for office to family Contact information: [place] [address] [phone]             Discharge Instructions  and  Discharge Medications     Allergies as of 11/19/2016      Reactions   Belladonna Alkaloids Anaphylaxis, Other (See Comments)   Pt states that this medication makes her eyes cross.     Dabigatran Other (See Comments)      Medication List    STOP taking these medications   apixaban 5 MG Tabs tablet Commonly known as:  ELIQUIS   flecainide 100 MG tablet Commonly known as:  TAMBOCOR   furosemide 80 MG tablet Commonly known as:  LASIX     TAKE these medications   albuterol 108 (90 Base) MCG/ACT inhaler Commonly known as:  PROVENTIL HFA Inhale 2 puffs into the lungs every  4 (four) hours as needed for wheezing or shortness of breath.   budesonide 0.5 MG/2ML nebulizer solution Commonly known as:  PULMICORT Take 2 mLs (0.5 mg total) by nebulization 2 (two) times daily.   cyanocobalamin 1000 MCG/ML injection Commonly known as:  (VITAMIN B-12) Vit B12 1000 mcg IM once a week for 4 weeks then once a month for 4 months   diltiazem 300 MG 24 hr capsule Commonly known as:  CARDIZEM CD Take 300 mg by mouth daily.   formoterol 20 MCG/2ML nebulizer solution Commonly known as:  PERFOROMIST Take 2 mLs (20 mcg total) by nebulization 2 (two) times daily.   ipratropium-albuterol 0.5-2.5 (3) MG/3ML Soln Commonly known as:  DUONEB Take 3 mLs by nebulization every 4 (four) hours as needed (for  wheezing/shortness of breath). DX Code: J44.9   DX: COPD   levothyroxine 75 MCG tablet Commonly known as:  SYNTHROID, LEVOTHROID Take 75 mcg by mouth daily before breakfast.   loratadine 10 MG tablet Commonly known as:  CLARITIN Take 10 mg by mouth daily.   nystatin 100000 UNIT/ML suspension Commonly known as:  MYCOSTATIN Take 5 mLs (500,000 Units total) by mouth 4 (four) times daily.   omega-3 acid ethyl esters 1 g capsule Commonly known as:  LOVAZA Take 1 g by mouth daily.   omeprazole 20 MG capsule Commonly known as:  PRILOSEC Take 20 mg by mouth 2 (two) times daily before a meal.   potassium chloride 10 MEQ tablet Commonly known as:  K-DUR,KLOR-CON Take 2 tablets (20 mEq total) by mouth 2 (two) times daily.   predniSONE 10 MG (21) Tbpk tablet Commonly known as:  STERAPRED UNI-PAK 21 TAB Taper by 10 mg daily   torsemide 20 MG tablet Commonly known as:  DEMADEX Take 2 tablets (40 mg total) by mouth 2 (two) times daily.            Durable Medical Equipment        Start     Ordered   11/10/16 1349  For home use only DME oxygen  Once    Comments:  POC with OCD titration  Question Answer Comment  Mode or (Route) Nasal cannula   Liters per Minute 2   Frequency Continuous (stationary and portable oxygen unit needed)   Oxygen conserving device Yes   Oxygen delivery system Gas      11/10/16 1349        Diet and Activity recommendation: See Discharge Instructions above   Consults obtained - cardio  consult   Major procedures and Radiology Reports - PLEASE review detailed and final reports for all details, in brief -     Dg Chest 2 View  Result Date: 11/12/2016 CLINICAL DATA:  Nonproductive cough and shortness of breath with exertion. History of lung cancer. Patient admitted 11/03/2016 for chest pressure and shortness of breath. EXAM: CHEST  2 VIEW COMPARISON:  Single-view of the chest 11/06/2016 and 11/03/2016. FINDINGS: Bilateral airspace disease,  worst in the left mid and upper lung zones, has improved. No pneumothorax is identified. Heart size enlarged. There are likely trace bilateral pleural effusions. IMPRESSION: Improved airspace disease, particularly in the left upper lobe. No new abnormality. Emphysema. Cardiomegaly. Atherosclerosis. Hiatal hernia. Electronically Signed   By: Inge Rise M.D.   On: 11/12/2016 11:12   Dg Chest 2 View  Result Date: 11/03/2016 CLINICAL DATA:  Acute onset of shortness of breath. Initial encounter. EXAM: CHEST  2 VIEW COMPARISON:  Chest radiograph and CTA of the chest  performed 07/30/2016 FINDINGS: The lungs are well-aerated. Vascular congestion is noted. Bilateral central airspace opacification may reflect pulmonary edema or pneumonia. There is no evidence of pleural effusion or pneumothorax. The heart is borderline normal in size. No acute osseous abnormalities are seen. A moderate hiatal hernia is noted. IMPRESSION: 1. Vascular congestion noted. Bilateral central airspace opacification may reflect pulmonary edema or pneumonia. 2. Moderate hiatal hernia seen. Electronically Signed   By: Garald Balding M.D.   On: 11/03/2016 17:09   Dg Chest Port 1 View  Result Date: 11/06/2016 CLINICAL DATA:  Cough EXAM: PORTABLE CHEST 1 VIEW COMPARISON:  November 03, 2016 and July 30, 2016 FINDINGS: There is increased airspace consolidation in the left mid lung and right base regions. Elsewhere the interstitium is prominent, likely due to mild interstitial edema superimposed on fibrotic type change. Heart is mildly enlarged with mild pulmonary venous hypertension. No adenopathy. There is aortic atherosclerosis. There is a hiatal hernia. Bones are osteoporotic with degenerative change in shoulders. IMPRESSION: Increase in airspace consolidation in the left mid lung and to a lesser extent right base regions. Suspect developing multifocal pneumonia, although alveolar edema could present in this manner. There is felt to be a  degree of underlying pulmonary vascular congestion with mild interstitial edema. These changes appear essentially stable. There is aortic atherosclerosis. There is a hiatal hernia. Electronically Signed   By: Lowella Grip III M.D.   On: 11/06/2016 07:05    Micro Results    No results found for this or any previous visit (from the past 240 hour(s)).     Today   Subjective:   Toni [name] today has Shortness of breath, no chest pain. Has leg edema. No hypoxia. Wants to go home. Objective:   Blood pressure 135/60, pulse 96, temperature 98 F (36.7 C), temperature source Oral, resp. rate 18, height 5' 6"  (1.676 m), weight 89.1 kg (196 lb 8 oz), SpO2 99 %.   Intake/Output Summary (Last 24 hours) at 11/19/16 1117 Last data filed at 11/19/16 1015  Gross per 24 hour  Intake              720 ml  Output             2350 ml  Net            -1630 ml    Exam Awake Alert, Oriented x 3, No new F.N deficits, Normal affect .AT,PERRAL Supple Neck,No JVD, No cervical lymphadenopathy appriciated.  Symmetrical Chest wall movement, Good air movement bilaterally, CTAB RRR,No Gallops,Rubs or new Murmurs, No Parasternal Heave +ve B.Sounds, Abd Soft, Non tender, No organomegaly appriciated, No rebound -guarding or rigidity. No Cyanosis, Clubbing or pitting edema 2+ bilaterally., No new Rash or bruise  Data Review   CBC w Diff: Lab Results  Component Value Date   WBC 7.9 11/18/2016   HGB 8.8 (L) 11/18/2016   HGB 9.1 (L) 08/05/2014   HCT 27.0 (L) 11/18/2016   HCT 29.4 (L) 08/05/2014   PLT 302 11/18/2016   PLT 267 08/05/2014   LYMPHOPCT 26 04/06/2016   LYMPHOPCT 20.3 08/05/2014   MONOPCT 10 04/06/2016   MONOPCT 9.2 08/05/2014   EOSPCT 4 04/06/2016   EOSPCT 5.5 08/05/2014   BASOPCT 0 04/06/2016   BASOPCT 2.3 08/05/2014    CMP: Lab Results  Component Value Date   NA 138 11/18/2016   NA 140 08/05/2014   K 4.4 11/18/2016   K 3.9 08/05/2014   CL 100 (L) 11/18/2016  CL 105  08/05/2014   CO2 33 (H) 11/18/2016   CO2 29 08/05/2014   BUN 50 (H) 11/18/2016   BUN 25 (H) 08/05/2014   CREATININE 1.32 (H) 11/18/2016   CREATININE 1.45 (H) 08/05/2014   PROT 6.9 11/03/2016   PROT 6.7 08/05/2014   ALBUMIN 3.8 11/03/2016   ALBUMIN 3.4 08/05/2014   BILITOT 0.9 11/03/2016   BILITOT 0.2 08/05/2014   ALKPHOS 85 11/03/2016   ALKPHOS 133 (H) 08/05/2014   AST 15 11/03/2016   AST 9 (L) 08/05/2014   ALT 11 (L) 11/03/2016   ALT 15 08/05/2014  .   Total Time in preparing paper work, data evaluation and todays exam - 68 minutes  Taylin Mans M.D on 11/19/2016 at 11:17 AM    Note: This dictation was prepared with Dragon dictation along with smaller phrase technology. Any transcriptional errors that result from this process are unintentional.

## 2016-11-19 NOTE — Progress Notes (Signed)
Spoke with Jeani Hawking, [place]. She has set up home health referral and O2 details - patient has spoken to someone who will be bringing equipment to her house tomorrow. Patient states she only wears her O2 as needed and at bedtime. Walked with patient in hallway - she stayed above 92% on room air. Per Jeani Hawking, patient can go home, she already has portable tank and O2 set up at home. No further needs by CM. Husband at bedside. Will proceed with discharge.

## 2016-11-19 NOTE — Care Management Note (Signed)
Case Management Note  Patient Details  Name: Toni [name] MRN: [id number] Date of Birth: 1935/08/20  Subjective/Objective:     Called [employer] about the delivery of [name]'s home 02 concentrator because [name] reports that the concentrator has not yet been delivered to her home by [facility]. Requested that the [place] representative please call [name] and explain their order and delivery process to her, and when she can expect the concentrator to be delivered. Meanwhile, [name] does have a portable 02 tank and home oxygen supplied by [employer].                Action/Plan:   Expected Discharge Date:  11/19/16               Expected Discharge Plan:  [place]  In-House Referral:     Discharge planning Services  CM Consult  Post Acute Care Choice:    Choice offered to:  Patient  DME Arranged:    DME Agency:  [employer] (has the chronic 02 and will make sure patient gets a Marine scientist)  HH Arranged:  RN, PT, Nurse's Aide [place] Agency:  [place]  Status of Service:  Completed, signed off  If discussed at [place] of Stay Meetings, dates discussed:    Additional Comments:  Ethel Meisenheimer A, RN 11/19/2016, 12:32 PM

## 2016-11-19 NOTE — Progress Notes (Signed)
Patient Name: Toni [name] Date of Encounter: 11/19/2016  Primary Cardiologist: [facility] Problem List     Active Problems:   Acute on chronic diastolic heart failure (HCC)   Anemia in chronic kidney disease   Cough   PAF (paroxysmal atrial fibrillation) (HCC)   Congestive heart failure (HCC)   Goals of care, counseling/discussion   Palliative care by specialist   Advance care planning     Subjective   Right heart catheterization results Performed 2 days ago PA pressure 50, wedge 22 She has remained on Lasix IV twice a day for the past 2 days with good urine output She reports significant improvement in her shortness of breath, close to baseline Reports that she walked with physical therapy yesterday Stable with a walker still with very mild leg edema, left leg greater than right leg   Inpatient Medications    Scheduled Meds: . sodium chloride   Intravenous Once  . budesonide (PULMICORT) nebulizer solution  0.5 mg Nebulization BID  . calcium carbonate  1 tablet Oral BID  . diltiazem  120 mg Oral Daily  . docusate sodium  100 mg Oral BID  . furosemide  60 mg Intravenous BID  . ipratropium-albuterol  3 mL Nebulization Q6H  . levothyroxine  75 mcg Oral QAC breakfast  . nystatin  5 mL Oral QID  . omega-3 acid ethyl esters  1 g Oral Daily  . pantoprazole  40 mg Oral BID  . polyethylene glycol  17 g Oral Daily  . potassium chloride  30 mEq Oral BID  . predniSONE  50 mg Oral Q breakfast  . rOPINIRole  0.25 mg Oral QHS  . sodium chloride flush  3 mL Intravenous Q12H  . traZODone  50 mg Oral QHS   Continuous Infusions:  PRN Meds: acetaminophen **OR** acetaminophen, ALPRAZolam, alum & mag hydroxide-simeth, diphenhydrAMINE, loratadine, magnesium hydroxide, ondansetron **OR** ondansetron (ZOFRAN) IV, oxyCODONE-acetaminophen, simethicone   Vital Signs    Vitals:   11/19/16 0403 11/19/16 0713 11/19/16 0837 11/19/16 1121  BP: (!) 105/54  135/60 (!) 118/50    Pulse: 93  96 96  Resp: 18   16  Temp: 98 F (36.7 C)   98.2 F (36.8 C)  TempSrc: Oral   Oral  SpO2: 96% 99%  98%  Weight: 196 lb 8 oz (89.1 kg)     Height:        Intake/Output Summary (Last 24 hours) at 11/19/16 1246 Last data filed at 11/19/16 1015  Gross per 24 hour  Intake              720 ml  Output             1950 ml  Net            -1230 ml   Filed Weights   11/17/16 0618 11/18/16 0435 11/19/16 0403  Weight: 199 lb 1.6 oz (90.3 kg) 196 lb 8 oz (89.1 kg) 196 lb 8 oz (89.1 kg)    Physical Exam    GEN: Well nourished, well developed, in no acute distress.  HEENT: Grossly normal.  Neck: Supple, no JVD, carotid bruits, or masses. Cardiac: Irregularly irregular, II/VI systolic murmur, no rubs, or gallops. No clubbing, cyanosis.  Trace + pitting edema to the knees bilaterally  Radials/DP/PT 2+ and equal bilaterally.  Respiratory:  Coarse breath sounds bilaterally, worse at the bases GI: Soft, nontender, nondistended, BS + x 4. MS: no deformity or atrophy. Skin: warm and  dry, no rash. Neuro:  Strength and sensation are intact. Psych: AAOx3.  Normal affect.  Labs    CBC  Recent Labs  11/18/16 0506  WBC 7.9  HGB 8.8*  HCT 27.0*  MCV 87.4  PLT 161   Basic Metabolic Panel  Recent Labs  11/18/16 0506  NA 138  K 4.4  CL 100*  CO2 33*  GLUCOSE 88  BUN 50*  CREATININE 1.32*  CALCIUM 8.0*   Liver Function Tests No results for input(s): AST, ALT, ALKPHOS, BILITOT, PROT, ALBUMIN in the last 72 hours. No results for input(s): LIPASE, AMYLASE in the last 72 hours. Cardiac Enzymes No results for input(s): CKTOTAL, CKMB, CKMBINDEX, TROPONINI in the last 72 hours. BNP Invalid input(s): POCBNP D-Dimer No results for input(s): DDIMER in the last 72 hours. Hemoglobin A1C No results for input(s): HGBA1C in the last 72 hours. Fasting Lipid Panel No results for input(s): CHOL, HDL, LDLCALC, TRIG, CHOLHDL, LDLDIRECT in the last 72 hours. Thyroid Function  Tests No results for input(s): TSH, T4TOTAL, T3FREE, THYROIDAB in the last 72 hours.  Invalid input(s): FREET3  Telemetry    Paced rhythm, Afib, 70-80 bpm , Personally Reviewed  ECG    n/a - Personally Reviewed  Radiology    No results found.  Cardiac Studies   TTE 11/06/16: Study Conclusions  - Left ventricle: The cavity size was normal. There was mild concentric hypertrophy. Systolic function was normal. The estimated ejection fraction was in the range of 55% to 60%. Wall motion was normal; there were no regional wall motion abnormalities. Features are consistent with a pseudonormal left ventricular filling pattern, with concomitant abnormal relaxation and increased filling pressure (grade 2 diastolic dysfunction). - Aortic valve: There was moderate to severe stenosis. Mean gradient (S): 30 mm Hg. Valve area (VTI): 0.98 cm^2. Valve area (Vmax): 1.05 cm^2. Valve area (Vmean): 1.01 cm^2. - Mitral valve: Severely calcified annulus. There was moderate regurgitation. - Left atrium: The atrium was severely dilated. - Right atrium: The atrium was moderately dilated. - Tricuspid valve: There was moderate regurgitation. - Pulmonary arteries: Systolic pressure was severely increased. PA peak pressure: 70 mm Hg (S).  Patient Profile     82 y.o. female with history of paroxysmal atrial flutter on eliquis, chronic diastolic CHF, COPD on nocturnal home oxygen, hypertension, hypothyroidism, CKD stage II presentedto hospital secondary to worsening shortness of breath.  Assessment & Plan    1. Acute on chronic respiratory failure:  acute on chronic diastolic CHF/pulmonary hypertension, anemia  and valvular heart disease, severe COPD Patient has indicated she would like to go home  Long discussion concerning CHF management  She does have underlying COPD, severe  Stable renal function I feel she is safe to go home later today after morning IV Lasix Close  follow-up in clinic  2. Acute on chronic diastolic CHF: Right heart catheterization with elevated wedge pressure, PA pressure Moderately elevated -Not on beta blocker given pulmonary status  She is on Cardizem for rate control  Would change to torsemide 40 mg twice a day at discharge  Potassium 10 mEq twice a day long discussion concerning daily weights, minimizing salt, fluid intake Discussed also with husband who is in the room Suggested we may need to increase torsemide dosing for weight gain 3 pounds Strongly recommended they call the office for weight gain  3. Persistent Afib -Rate controlledPaced rhythm,  -Continue Cardizem 120 mg daily -Eliquis was stopped earlier this admission 2/2 down trending hgb Long discussion with her  concerning risk and benefit of stopping anticoagulation  Hematocrit 27, still very tenuous  -CHADS2VASc at least 6 (CHF, HTN, age x 2, vascular disease, female) Stool guaiacs still not sent . Referral to GI ? Hematology ? We can help arrange as an outpatient if primary care unable to assist  4. Anemia: -Status post 1 unit pRBC this admission Etiology unclear   5. AS: -Moderate  outpatient follow up  6. COPD Long history of smoking, likely contributing to underlying pulmonary hypertension   Total encounter time more than 35 minutes  Greater than 50% was spent in counseling and coordination of care with the patient    Esmond Plants  M.D., Ph.D. [facility]

## 2016-11-20 ENCOUNTER — Telehealth: Payer: Self-pay | Admitting: Cardiovascular Disease

## 2016-11-20 NOTE — Telephone Encounter (Signed)
Patient contacted regarding discharge from [facility] on 11/19/16.  Patient understands to follow up with Dr. Rockey Situ on 11/28/16 at 2:40 at [facility]. Patient understands discharge instructions? yes Patient understands medications and regiment? yes Patient understands to bring all medications to this visit? yes

## 2016-11-20 NOTE — Telephone Encounter (Signed)
TCM/PH DR Rockey Situ 3/13 2:40

## 2016-11-21 ENCOUNTER — Observation Stay
Admission: EM | Admit: 2016-11-21 | Discharge: 2016-11-23 | Disposition: A | Discharge status: Home / Self Care | Payer: Medicare Other | Attending: Internal Medicine | Admitting: Internal Medicine

## 2016-11-21 ENCOUNTER — Emergency Department: Payer: Medicare Other

## 2016-11-21 DIAGNOSIS — J441 Chronic obstructive pulmonary disease with (acute) exacerbation: Principal | ICD-10-CM | POA: Diagnosis present

## 2016-11-21 DIAGNOSIS — I350 Nonrheumatic aortic (valve) stenosis: Secondary | ICD-10-CM | POA: Diagnosis not present

## 2016-11-21 DIAGNOSIS — R7989 Other specified abnormal findings of blood chemistry: Secondary | ICD-10-CM

## 2016-11-21 DIAGNOSIS — Z8249 Family history of ischemic heart disease and other diseases of the circulatory system: Secondary | ICD-10-CM | POA: Diagnosis not present

## 2016-11-21 DIAGNOSIS — Z96653 Presence of artificial knee joint, bilateral: Secondary | ICD-10-CM | POA: Diagnosis not present

## 2016-11-21 DIAGNOSIS — I5032 Chronic diastolic (congestive) heart failure: Secondary | ICD-10-CM | POA: Diagnosis not present

## 2016-11-21 DIAGNOSIS — I480 Paroxysmal atrial fibrillation: Secondary | ICD-10-CM | POA: Diagnosis present

## 2016-11-21 DIAGNOSIS — E039 Hypothyroidism, unspecified: Secondary | ICD-10-CM | POA: Insufficient documentation

## 2016-11-21 DIAGNOSIS — R0600 Dyspnea, unspecified: Secondary | ICD-10-CM

## 2016-11-21 DIAGNOSIS — I2699 Other pulmonary embolism without acute cor pulmonale: Secondary | ICD-10-CM

## 2016-11-21 DIAGNOSIS — R778 Other specified abnormalities of plasma proteins: Secondary | ICD-10-CM | POA: Diagnosis not present

## 2016-11-21 DIAGNOSIS — Z85118 Personal history of other malignant neoplasm of bronchus and lung: Secondary | ICD-10-CM | POA: Diagnosis not present

## 2016-11-21 DIAGNOSIS — I712 Thoracic aortic aneurysm, without rupture: Secondary | ICD-10-CM | POA: Insufficient documentation

## 2016-11-21 DIAGNOSIS — D631 Anemia in chronic kidney disease: Secondary | ICD-10-CM | POA: Diagnosis not present

## 2016-11-21 DIAGNOSIS — N179 Acute kidney failure, unspecified: Secondary | ICD-10-CM | POA: Insufficient documentation

## 2016-11-21 DIAGNOSIS — J9610 Chronic respiratory failure, unspecified whether with hypoxia or hypercapnia: Secondary | ICD-10-CM | POA: Diagnosis not present

## 2016-11-21 DIAGNOSIS — Z87891 Personal history of nicotine dependence: Secondary | ICD-10-CM | POA: Insufficient documentation

## 2016-11-21 DIAGNOSIS — R0609 Other forms of dyspnea: Secondary | ICD-10-CM

## 2016-11-21 DIAGNOSIS — N182 Chronic kidney disease, stage 2 (mild): Secondary | ICD-10-CM | POA: Diagnosis not present

## 2016-11-21 DIAGNOSIS — R0602 Shortness of breath: Secondary | ICD-10-CM | POA: Diagnosis present

## 2016-11-21 DIAGNOSIS — I130 Hypertensive heart and chronic kidney disease with heart failure and stage 1 through stage 4 chronic kidney disease, or unspecified chronic kidney disease: Secondary | ICD-10-CM | POA: Insufficient documentation

## 2016-11-21 DIAGNOSIS — N189 Chronic kidney disease, unspecified: Secondary | ICD-10-CM | POA: Diagnosis present

## 2016-11-21 DIAGNOSIS — R0603 Acute respiratory distress: Secondary | ICD-10-CM | POA: Diagnosis present

## 2016-11-21 LAB — CBC WITH DIFFERENTIAL/PLATELET
Basophils Absolute: 0 10*3/uL (ref 0–0.1)
Basophils Relative: 0 %
EOS ABS: 0 10*3/uL (ref 0–0.7)
Eosinophils Relative: 0 %
HCT: 31.9 % — ABNORMAL LOW (ref 35.0–47.0)
HEMOGLOBIN: 10.5 g/dL — AB (ref 12.0–16.0)
LYMPHS ABS: 0.3 10*3/uL — AB (ref 1.0–3.6)
Lymphocytes Relative: 4 %
MCH: 28.7 pg (ref 26.0–34.0)
MCHC: 32.9 g/dL (ref 32.0–36.0)
MCV: 87.4 fL (ref 80.0–100.0)
Monocytes Absolute: 0.4 10*3/uL (ref 0.2–0.9)
Monocytes Relative: 5 %
NEUTROS PCT: 91 %
Neutro Abs: 7.8 10*3/uL — ABNORMAL HIGH (ref 1.4–6.5)
Platelets: 273 10*3/uL (ref 150–440)
RBC: 3.65 MIL/uL — ABNORMAL LOW (ref 3.80–5.20)
RDW: 16.9 % — ABNORMAL HIGH (ref 11.5–14.5)
WBC: 8.6 10*3/uL (ref 3.6–11.0)

## 2016-11-21 LAB — COMPREHENSIVE METABOLIC PANEL
ALK PHOS: 62 U/L (ref 38–126)
ALT: 16 U/L (ref 14–54)
ANION GAP: 11 (ref 5–15)
AST: 27 U/L (ref 15–41)
Albumin: 3.9 g/dL (ref 3.5–5.0)
BILIRUBIN TOTAL: 0.6 mg/dL (ref 0.3–1.2)
BUN: 54 mg/dL — ABNORMAL HIGH (ref 6–20)
CALCIUM: 8.3 mg/dL — AB (ref 8.9–10.3)
CO2: 31 mmol/L (ref 22–32)
Chloride: 98 mmol/L — ABNORMAL LOW (ref 101–111)
Creatinine, Ser: 1.73 mg/dL — ABNORMAL HIGH (ref 0.44–1.00)
GFR calc non Af Amer: 27 mL/min — ABNORMAL LOW (ref >60)
GFR, EST AFRICAN AMERICAN: 31 mL/min — AB (ref >60)
Glucose, Bld: 118 mg/dL — ABNORMAL HIGH (ref 65–99)
Potassium: 4.1 mmol/L (ref 3.5–5.1)
SODIUM: 140 mmol/L (ref 135–145)
TOTAL PROTEIN: 6.5 g/dL (ref 6.5–8.1)

## 2016-11-21 LAB — TROPONIN I
Troponin I: 0.07 ng/mL (ref <0.03)
Troponin I: 0.06 ng/mL (ref <0.03)

## 2016-11-21 LAB — APTT: aPTT: 24 seconds (ref 24–36)

## 2016-11-21 LAB — PROTIME-INR
INR: 1.04
PROTHROMBIN TIME: 13.6 s (ref 11.4–15.2)

## 2016-11-21 MED ORDER — TECHNETIUM TC 99M DIETHYLENETRIAME-PENTAACETIC ACID
32.5860 | Freq: Once | INTRAVENOUS | Status: AC | PRN
  Administered 2016-11-21: 32.586 via INTRAVENOUS

## 2016-11-21 MED ORDER — TECHNETIUM TO 99M ALBUMIN AGGREGATED
3.9000 | Freq: Once | INTRAVENOUS | Status: AC | PRN
  Administered 2016-11-21: 3.9 via INTRAVENOUS

## 2016-11-21 MED ORDER — HEPARIN (PORCINE) IN NACL 100-0.45 UNIT/ML-% IJ SOLN
1300.0000 [IU]/h | INTRAMUSCULAR | Status: DC
  Administered 2016-11-22: 1300 [IU]/h via INTRAVENOUS
  Filled 2016-11-21: qty 250

## 2016-11-21 MED ORDER — IPRATROPIUM-ALBUTEROL 0.5-2.5 (3) MG/3ML IN SOLN
3.0000 mL | Freq: Once | RESPIRATORY_TRACT | Status: AC
  Administered 2016-11-21: 3 mL via RESPIRATORY_TRACT
  Filled 2016-11-21: qty 3

## 2016-11-21 MED ORDER — HEPARIN BOLUS VIA INFUSION
4600.0000 [IU] | Freq: Once | INTRAVENOUS | Status: AC
  Administered 2016-11-22: 4600 [IU] via INTRAVENOUS
  Filled 2016-11-21: qty 4600

## 2016-11-21 NOTE — ED Notes (Signed)
Pt arrived via ems for c/o shortness of breath - pt was just discharged from the hospital yesterday for the same - pt has hx of COPD, CHF, and lung CA - pt was sat'ing at 100% on 2L when ems arrived at home - homehealth nurse had given one albuterol tx - ems gave albuterol tx and solu-medrol '125mg'$  IV - at this time pt appears in no acute respiratory distress - she is resting quietly in bed on 2L o2 via n/c - respirations are even and unlabored at this time

## 2016-11-21 NOTE — ED Provider Notes (Signed)
[facility] Emergency Department Provider Note  ____________________________________________  Time seen: Approximately 6:44 PM  I have reviewed the triage vital signs and the nursing notes.   HISTORY  Chief Complaint Shortness of Breath    HPI Toni [name] is a 82 y.o. female complains of shortness of breath, gradually worsening today. She uses 2 L home oxygen when needed at home. States that she's been feeling very short of breath and worse with exertion today. Denies any chest pain. No cough. Had a 10 kg weight loss during a recent hospitalization for CHF, was discharged from the hospital yesterday. Reports that her weight is 3 pounds lower today than it was when she left the hospital. 100% on her home 2 L nasal cannula when EMS arrived at the home.     Past Medical History:  Diagnosis Date  . Atypical atrial flutter ([place]) 11/16/2013   a. CHA2DS2VASc = 5-->prev on xarelto, d/c'd due to bleeding skin tear;  c. 11/2013->Placed on flecainide.  . Cataract   . Chronic diastolic CHF (congestive heart failure) ([place])    a. 11/2013 Echo: EF 55-60%, mild AS/MR, mod dil LA/RA, PASP 1mHg.  .Marland KitchenChronic diastolic heart failure (HCrockett   . Collagen vascular disease (HLa Fermina   . COPD (chronic obstructive pulmonary disease) (HSouth Heart   . Essential hypertension, benign   . Hypokalemia   . Hypothyroidism   . 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 Lung cancer (HLeisure Village West   . Lung nodule    Followed by Dr. FRaul Del . PAF (paroxysmal atrial fibrillation) (HBoyd    a. 11/2013 Echo: EF 55-60%, mild AS/MR, mod dil LA/RA, PASP 396mg;  b. CHA2DS2VASc = 5-->prev on xarelto, d/c'd due to bleeding skin tear;  c. 11/2013->Placed on flecainide.     Patient Active Problem List   Diagnosis Date Noted  . Advance care planning   . Goals of care, counseling/discussion   . Palliative care by specialist   . Congestive heart failure (HCMcCallsburg  . PAF (paroxysmal atrial  fibrillation) (HCMole Lake02/22/2018  . Cough   . Anemia in chronic kidney disease 11/07/2016  . Aortic valve stenosis, moderate 08/03/2016  . Adjustment disorder with anxiety 02/23/2016  . Dyspepsia   . Esophageal reflux   . Encounter for anticoagulation discussion and counseling 11/17/2015  . Fall 11/17/2015  . Essential hypertension, benign   . Lung infiltrate on CT 06/08/2015  . Acute on chronic diastolic heart failure (HCNashwauk08/29/2016  . Insomnia 04/17/2014  . Squamous cell lung cancer, right (HCJuno Ridge07/06/2014  . Postnasal drip 03/05/2014  . Hyperlipidemia 12/04/2013  . Bilateral leg edema 12/04/2013  . COPD exacerbation (HCMarengo03/11/2013  . Atypical atrial flutter (HCWalcott03/09/2013  . COPD (chronic obstructive pulmonary disease) (HCSalunga02/28/2015  . Chronic kidney disease 08/24/2013  . Essential (primary) hypertension 08/24/2013  . Adult hypothyroidism 08/24/2013  . Acute exacerbation of chronic obstructive airways disease (HCCooter12/03/2013  . Chronic obstructive pulmonary disease with acute exacerbation (HCValley City12/03/2013     Past Surgical History:  Procedure Laterality Date  . APPENDECTOMY    . EYE SURGERY    . RIGHT HEART CATH N/A 11/17/2016   Procedure: Right Heart Cath;  Surgeon: MuWellington HampshireMD;  [place] [facility];  Service: Cardiovascular;  Laterality: N/A;  . TOTAL KNEE ARTHROPLASTY Bilateral      Prior to Admission medications   Medication Sig Start Date End Date Taking? Authorizing Provider  albuterol (PROVENTIL HFA) 108 (  90 Base) MCG/ACT inhaler Inhale 2 puffs into the lungs every 4 (four) hours as needed for wheezing or shortness of breath. 07/23/16  Yes Carrie Mew, MD  ALPRAZolam Duanne Moron) 0.25 MG tablet Take 1 tablet (0.25 mg total) by mouth at bedtime as needed for anxiety. 11/19/16  Yes Epifanio Lesches, MD  budesonide (PULMICORT) 0.5 MG/2ML nebulizer solution Take 2 mLs (0.5 mg total) by nebulization 2 (two) times daily. 06/08/15  Yes Flora Lipps, MD  cyanocobalamin (,VITAMIN B-12,) 1000 MCG/ML injection Vit B12 1000 mcg IM once a week for 4 weeks then once a month for 4 months 10/09/16  Yes Historical Provider, MD  diltiazem (CARDIZEM CD) 300 MG 24 hr capsule Take 300 mg by mouth daily.   Yes Historical Provider, MD  formoterol (PERFOROMIST) 20 MCG/2ML nebulizer solution Take 2 mLs (20 mcg total) by nebulization 2 (two) times daily. 06/08/15  Yes Flora Lipps, MD  ipratropium-albuterol (DUONEB) 0.5-2.5 (3) MG/3ML SOLN Take 3 mLs by nebulization every 4 (four) hours as needed (for wheezing/shortness of breath). DX Code: J44.9   DX: COPD 08/29/16  Yes Wilhelmina Mcardle, MD  levothyroxine (SYNTHROID, LEVOTHROID) 75 MCG tablet Take 75 mcg by mouth daily before breakfast.   Yes Historical Provider, MD  nystatin (MYCOSTATIN) 100000 UNIT/ML suspension Take 5 mLs (500,000 Units total) by mouth 4 (four) times daily. 11/19/16  Yes Epifanio Lesches, MD  omega-3 acid ethyl esters (LOVAZA) 1 g capsule Take 1 g by mouth daily.   Yes Historical Provider, MD  omeprazole (PRILOSEC) 20 MG capsule Take 20 mg by mouth 2 (two) times daily before a meal.   Yes Historical Provider, MD  potassium chloride (K-DUR,KLOR-CON) 10 MEQ tablet Take 2 tablets (20 mEq total) by mouth 2 (two) times daily. 08/03/16  Yes Minna Merritts, MD  predniSONE (STERAPRED UNI-PAK 21 TAB) 10 MG (21) TBPK tablet Taper by 10 mg daily 11/19/16  Yes Epifanio Lesches, MD  torsemide (DEMADEX) 20 MG tablet Take 2 tablets (40 mg total) by mouth 2 (two) times daily. 11/19/16  Yes Epifanio Lesches, MD  loratadine (CLARITIN) 10 MG tablet Take 10 mg by mouth daily.    Historical Provider, MD     Allergies Belladonna alkaloids and Dabigatran   Family History  Problem Relation Age of Onset  . CAD Father   . Heart disease Father   . Heart attack Father   . Arrhythmia Sister     Social History Social History  Substance Use Topics  . Smoking status: Former Smoker    Packs/day: 1.50     Years: 50.00    Types: Cigarettes    Quit date: 09/18/2002  . Smokeless tobacco: Never Used  . Alcohol use No    Review of Systems  Constitutional:   No fever or chills.  ENT:   No sore throat. No rhinorrhea. Cardiovascular:   No chest pain. Respiratory:   Positive shortness of breath without cough. Gastrointestinal:   Negative for abdominal pain, vomiting and diarrhea.  Genitourinary:   Negative for dysuria or difficulty urinating. Musculoskeletal:   Negative for focal pain or swelling Neurological:   Negative for headaches 10-point ROS otherwise negative.  ____________________________________________   PHYSICAL EXAM:  VITAL SIGNS: ED Triage Vitals  Enc Vitals Group     BP 11/21/16 1502 132/73     Pulse Rate 11/21/16 1502 (!) 103     Resp 11/21/16 1502 17     Temp 11/21/16 1502 97.6 F (36.4 C)     Temp Source  11/21/16 1502 Oral     SpO2 11/21/16 1458 99 %     Weight 11/21/16 1502 187 lb (84.8 kg)     Height 11/21/16 1502 _0  (1.676 m)     Head Circumference --      Peak Flow --      Pain Score 11/21/16 1502 0     Pain Loc --      Pain Edu? --      Excl. in GC? --   Walking room air oxygen saturation 85%. The patient short of breath with this.  Vital signs reviewed, nursing assessments reviewed.   Constitutional:   Alert and oriented. Not in distress. Eyes:   No scleral icterus. No conjunctival pallor. PERRL. EOMI.  No nystagmus. ENT   Head:   Normocephalic and atraumatic.   Nose:   No congestion/rhinnorhea. No septal hematoma   Mouth/Throat:   MMM, no pharyngeal erythema. No peritonsillar mass.    Neck:   No stridor. No SubQ emphysema. No meningismus. Hematological/Lymphatic/Immunilogical:   No cervical lymphadenopathy. Cardiovascular:   Tachycardia heart rate 100. Symmetric bilateral radial and DP pulses.  No murmurs.  Respiratory:   Normal respiratory effort without tachypnea nor retractions. Breath sounds are  equal bilaterally. Slight end  expiratory wheezing. Gastrointestinal:   Soft and nontender. Non distended. There is no CVA tenderness.  No rebound, rigidity, or guarding. Genitourinary:   deferred Musculoskeletal:   Normal range of motion in all extremities. No joint effusions.  No lower extremity tenderness.  No edema. Neurologic:   Normal speech and language.  CN 2-10 normal. Motor grossly intact. No gross focal neurologic deficits are appreciated.  Skin:    Skin is warm, dry and intact. No rash noted.  No petechiae, purpura, or bullae.  ____________________________________________    LABS (pertinent positives/negatives) (all labs ordered are listed, but only abnormal results are displayed) Labs Reviewed  TROPONIN I - Abnormal; Notable for the following:       Result Value   Troponin I 0.07 (*)    All other components within normal limits  CBC WITH DIFFERENTIAL/PLATELET - Abnormal; Notable for the following:    RBC 3.65 (*)    Hemoglobin 10.5 (*)    HCT 31.9 (*)    RDW 16.9 (*)    Neutro Abs 7.8 (*)    Lymphs Abs 0.3 (*)    All other components within normal limits  COMPREHENSIVE METABOLIC PANEL - Abnormal; Notable for the following:    Chloride 98 (*)    Glucose, Bld 118 (*)    BUN 54 (*)    Creatinine, Ser 1.73 (*)    Calcium 8.3 (*)    GFR calc non Af Amer 27 (*)    GFR calc Af Amer 31 (*)    All other components within normal limits  TROPONIN I - Abnormal; Notable for the following:    Troponin I 0.06 (*)    All other components within normal limits   ____________________________________________   EKG  Interpreted by me Sinus tachycardia rate 104, normal axis and intervals. Normal QRS ST segments and T waves.  ____________________________________________    SFKCLEXNT  Dg Chest 2 View  Result Date: 11/21/2016 CLINICAL DATA:  Shortness of Breath EXAM: CHEST  2 VIEW COMPARISON:  11/12/2016 FINDINGS: Cardiac shadow is mildly prominent but stable. A hiatal hernia is again seen. The lungs  are well aerated bilaterally. No focal infiltrate or sizable effusion is seen. Mild compression deformities of a chronic nature are noted  in the thoracic spine. Previously seen left upper lobe infiltrate has resolved in the interval. IMPRESSION: No active cardiopulmonary disease. Electronically Signed   By: Inez Catalina M.D.   On: 11/21/2016 15:43   Nm Pulmonary Perf And Vent  Result Date: 11/21/2016 CLINICAL DATA:  Shortness of breath for several days EXAM: NUCLEAR MEDICINE VENTILATION - PERFUSION LUNG SCAN TECHNIQUE: Ventilation images were obtained in multiple projections using inhaled aerosol Tc-98mDTPA. Perfusion images were obtained in multiple projections after intravenous injection of Tc-961mAA. RADIOPHARMACEUTICALS:  32.586 mCi Technetium-9976mPA aerosol inhalation and 3.9 mCi Technetium-93m15m IV COMPARISON:  Radiograph 11/21/2016, CT chest 07/19/2016 FINDINGS: Ventilation: Very heterogenous ventilation with marked central clumping of radiotracer, consistent with obstructive disease. This limits evaluation for PE. Perfusion: Single moderate wedge shaped peripheral defect in the right upper lobe. Mild diffuse heterogeneous perfusion. IMPRESSION: 1. Markedly heterogeneous ventilation with significant central clumping of radiotracer activity, consistent with obstructive lung disease. This limits assessment for PE 2. Single moderate wedge-shaped peripheral perfusion defect in the right upper lobe ; overall intermediate probability for PE. Electronically Signed   By: Kim Donavan Foil.   On: 11/21/2016 21:17    ____________________________________________   PROCEDURES Procedures CRITICAL CARE Performed by: STAFJoni FearsILLIP   Total critical care time: 35 minutes  Critical care time was exclusive of separately billable procedures and treating other patients.  Critical care was necessary to treat or prevent imminent or life-threatening deterioration.  Critical care was time spent  personally by me on the following activities: development of treatment plan with patient and/or surrogate as well as nursing, discussions with consultants, evaluation of patient's response to treatment, examination of patient, obtaining history from patient or surrogate, ordering and performing treatments and interventions, ordering and review of laboratory studies, ordering and review of radiographic studies, pulse oximetry and re-evaluation of patient's condition.  ____________________________________________   INITIAL IMPRESSION / ASSESSMENT AND PLAN / ED COURSE  Pertinent labs & imaging results that were available during my care of the patient were reviewed by me and considered in my medical decision making (see chart for details).  Patient not in distress, presents with shortness of breath. Has some hypoxia with ambulation on room air, this is not surprising given her baseline chronic respiratory failure requiring home oxygen. In bed on 2 L her oxygen saturation is 99-100%. She still feels short of breath. Exam suggests some wheezing and likely bronchospasm. I'll give a DuoNeb while getting a VQ scan to evaluate for PE due to her recent hospitalization putting her at risk for venous thromboembolism. Patient is not a candidate for CT angiogram due to her low GFR with chronic kidney disease. If negative I think patient is suitable for outpatient follow-up with continued steroids and bronchodilators given that her oxygenation is adequate and her other chronic issues seem to be under control. Low suspicion for ACS dissection carditis pneumonia or sepsis.  Initial troponin 0.07. This is of unclear significance given her atypical symptoms for ACS. We'll monitor and check a serial troponin.   Clinical Course as of Nov 22 2139  Tue Nov 21, 2016  2139 V/Q scan intermediate prob. Trop positive, c/w possible heart strain from PE vs ACS, though sx very atypical for ACS. Will start heparin, admit.   [PS]     Clinical Course User Index [PS] PhilCarrie Mew     ____________________________________________   FINAL CLINICAL IMPRESSION(S) / ED DIAGNOSES  Final diagnoses:  Dyspnea  Dyspnea on exertion  Other acute pulmonary embolism  without acute cor pulmonale (HCC)  Elevated troponin      New Prescriptions   No medications on file     Portions of this note were generated with dragon dictation software. Dictation errors may occur despite best attempts at proofreading.    Carrie Mew, MD 11/21/16 2142

## 2016-11-21 NOTE — ED Notes (Signed)
Elevated troponin reported to Dr Joni Fears

## 2016-11-21 NOTE — ED Notes (Signed)
Lab to draw patient due to frequent hemolyzation of specimen.

## 2016-11-21 NOTE — ED Notes (Signed)
Pt assisted to bathroom

## 2016-11-21 NOTE — ED Notes (Signed)
Blue top tube hemolyzed - per lab need to redraw and reorder

## 2016-11-21 NOTE — ED Triage Notes (Signed)
Pt arrived via ems for c/o shortness of breath - pt was just discharged from the hospital yesterday for the same - pt has hx of COPD, CHF, and lung CA - pt was sat'ing at 100% on 2L when ems arrived at home - homehealth nurse had given one albuterol tx - ems gave albuterol tx and solu-medrol '125mg'$  IV - at this time pt appears in no acute respiratory distress - she is resting quietly in bed on 2L o2 via n/c - respirations are even and unlabored at this time

## 2016-11-21 NOTE — ED Notes (Signed)
Pt taken to nuclear medicine for VQ scan.

## 2016-11-22 ENCOUNTER — Observation Stay: Payer: Medicare Other

## 2016-11-22 DIAGNOSIS — I5032 Chronic diastolic (congestive) heart failure: Secondary | ICD-10-CM | POA: Diagnosis not present

## 2016-11-22 DIAGNOSIS — R7989 Other specified abnormal findings of blood chemistry: Secondary | ICD-10-CM

## 2016-11-22 DIAGNOSIS — N183 Chronic kidney disease, stage 3 (moderate): Secondary | ICD-10-CM

## 2016-11-22 DIAGNOSIS — R778 Other specified abnormalities of plasma proteins: Secondary | ICD-10-CM

## 2016-11-22 DIAGNOSIS — J441 Chronic obstructive pulmonary disease with (acute) exacerbation: Secondary | ICD-10-CM

## 2016-11-22 DIAGNOSIS — I480 Paroxysmal atrial fibrillation: Secondary | ICD-10-CM

## 2016-11-22 DIAGNOSIS — R0603 Acute respiratory distress: Secondary | ICD-10-CM

## 2016-11-22 DIAGNOSIS — I350 Nonrheumatic aortic (valve) stenosis: Secondary | ICD-10-CM

## 2016-11-22 DIAGNOSIS — D631 Anemia in chronic kidney disease: Secondary | ICD-10-CM

## 2016-11-22 DIAGNOSIS — R0600 Dyspnea, unspecified: Secondary | ICD-10-CM

## 2016-11-22 LAB — CBC
HEMATOCRIT: 27.9 % — AB (ref 35.0–47.0)
HEMATOCRIT: 29.5 % — AB (ref 35.0–47.0)
HEMOGLOBIN: 9.2 g/dL — AB (ref 12.0–16.0)
HEMOGLOBIN: 9.7 g/dL — AB (ref 12.0–16.0)
MCH: 28.8 pg (ref 26.0–34.0)
MCH: 28.4 pg (ref 26.0–34.0)
MCHC: 33.1 g/dL (ref 32.0–36.0)
MCHC: 32.7 g/dL (ref 32.0–36.0)
MCV: 87 fL (ref 80.0–100.0)
MCV: 86.7 fL (ref 80.0–100.0)
Platelets: 235 10*3/uL (ref 150–440)
Platelets: 258 10*3/uL (ref 150–440)
RBC: 3.2 MIL/uL — AB (ref 3.80–5.20)
RBC: 3.4 MIL/uL — AB (ref 3.80–5.20)
RDW: 17.4 % — ABNORMAL HIGH (ref 11.5–14.5)
RDW: 17.5 % — ABNORMAL HIGH (ref 11.5–14.5)
WBC: 5.6 10*3/uL (ref 3.6–11.0)
WBC: 6.3 10*3/uL (ref 3.6–11.0)

## 2016-11-22 LAB — HEPARIN LEVEL (UNFRACTIONATED)
Heparin Unfractionated: <0.1 IU/mL — ABNORMAL LOW (ref 0.30–0.70)
Heparin Unfractionated: 1.36 IU/mL — ABNORMAL HIGH (ref 0.30–0.70)

## 2016-11-22 LAB — TROPONIN I
TROPONIN I: 0.08 ng/mL — AB (ref <0.03)
TROPONIN I: 0.08 ng/mL — AB (ref <0.03)
TROPONIN I: 0.08 ng/mL — AB (ref <0.03)

## 2016-11-22 LAB — BASIC METABOLIC PANEL
ANION GAP: 12 (ref 5–15)
BUN: 55 mg/dL — ABNORMAL HIGH (ref 6–20)
CALCIUM: 8.2 mg/dL — AB (ref 8.9–10.3)
CO2: 28 mmol/L (ref 22–32)
Chloride: 100 mmol/L — ABNORMAL LOW (ref 101–111)
Creatinine, Ser: 1.22 mg/dL — ABNORMAL HIGH (ref 0.44–1.00)
GFR, EST AFRICAN AMERICAN: 48 mL/min — AB (ref >60)
GFR, EST NON AFRICAN AMERICAN: 41 mL/min — AB (ref >60)
GLUCOSE: 116 mg/dL — AB (ref 65–99)
POTASSIUM: 3.7 mmol/L (ref 3.5–5.1)
Sodium: 140 mmol/L (ref 135–145)

## 2016-11-22 MED ORDER — SODIUM CHLORIDE 0.9 % IV SOLN
INTRAVENOUS | Status: DC
  Administered 2016-11-22: 02:00:00 via INTRAVENOUS

## 2016-11-22 MED ORDER — TORSEMIDE 20 MG PO TABS
40.0000 mg | ORAL_TABLET | Freq: Two times a day (BID) | ORAL | Status: DC
  Administered 2016-11-22: 40 mg via ORAL
  Filled 2016-11-22: qty 2

## 2016-11-22 MED ORDER — SODIUM CHLORIDE 0.9% FLUSH
3.0000 mL | Freq: Two times a day (BID) | INTRAVENOUS | Status: DC
  Administered 2016-11-22 – 2016-11-23 (×3): 3 mL via INTRAVENOUS

## 2016-11-22 MED ORDER — BISACODYL 5 MG PO TBEC: 5.0000 mg | DELAYED_RELEASE_TABLET | Freq: Every day | ORAL | Status: DC | PRN

## 2016-11-22 MED ORDER — BUDESONIDE 0.5 MG/2ML IN SUSP: 0.5000 mg | Freq: Two times a day (BID) | RESPIRATORY_TRACT | Status: DC

## 2016-11-22 MED ORDER — ONDANSETRON HCL 4 MG PO TABS: 4.0000 mg | ORAL_TABLET | Freq: Four times a day (QID) | ORAL | Status: DC | PRN

## 2016-11-22 MED ORDER — LORATADINE 10 MG PO TABS
10.0000 mg | ORAL_TABLET | Freq: Every day | ORAL | Status: DC
  Administered 2016-11-22 – 2016-11-23 (×2): 10 mg via ORAL
  Filled 2016-11-22 (×2): qty 1

## 2016-11-22 MED ORDER — IPRATROPIUM BROMIDE 0.02 % IN SOLN: 0.5000 mg | Freq: Four times a day (QID) | RESPIRATORY_TRACT | Status: DC | PRN

## 2016-11-22 MED ORDER — PREDNISONE 10 MG PO TABS: 10.0000 mg | ORAL_TABLET | Freq: Three times a day (TID) | ORAL | Status: DC

## 2016-11-22 MED ORDER — PREDNISONE 10 MG PO TABS: 20.0000 mg | ORAL_TABLET | Freq: Every evening | ORAL | Status: DC

## 2016-11-22 MED ORDER — PREDNISONE 10 MG (21) PO TBPK: 10.0000 mg | ORAL_TABLET | ORAL | Status: DC

## 2016-11-22 MED ORDER — PREDNISONE 10 MG PO TABS
20.0000 mg | ORAL_TABLET | Freq: Every morning | ORAL | Status: AC
  Administered 2016-11-22: 20 mg via ORAL
  Filled 2016-11-22: qty 1

## 2016-11-22 MED ORDER — ARFORMOTEROL TARTRATE 15 MCG/2ML IN NEBU
15.0000 ug | INHALATION_SOLUTION | Freq: Two times a day (BID) | RESPIRATORY_TRACT | Status: DC
  Administered 2016-11-22 – 2016-11-23 (×3): 15 ug via RESPIRATORY_TRACT
  Filled 2016-11-22 (×4): qty 2

## 2016-11-22 MED ORDER — OMEGA-3-ACID ETHYL ESTERS 1 G PO CAPS
1.0000 g | ORAL_CAPSULE | Freq: Every day | ORAL | Status: DC
  Administered 2016-11-22 – 2016-11-23 (×2): 1 g via ORAL
  Filled 2016-11-22 (×2): qty 1

## 2016-11-22 MED ORDER — SENNOSIDES-DOCUSATE SODIUM 8.6-50 MG PO TABS: 1.0000 | ORAL_TABLET | Freq: Every evening | ORAL | Status: DC | PRN

## 2016-11-22 MED ORDER — FUROSEMIDE 10 MG/ML IJ SOLN
60.0000 mg | Freq: Two times a day (BID) | INTRAMUSCULAR | Status: DC
  Administered 2016-11-22 – 2016-11-23 (×2): 60 mg via INTRAVENOUS
  Filled 2016-11-22 (×2): qty 6

## 2016-11-22 MED ORDER — ALBUTEROL SULFATE (2.5 MG/3ML) 0.083% IN NEBU: 2.5000 mg | INHALATION_SOLUTION | Freq: Four times a day (QID) | RESPIRATORY_TRACT | Status: DC | PRN

## 2016-11-22 MED ORDER — LEVOTHYROXINE SODIUM 75 MCG PO TABS
75.0000 ug | ORAL_TABLET | Freq: Every day | ORAL | Status: DC
  Administered 2016-11-22 – 2016-11-23 (×2): 75 ug via ORAL
  Filled 2016-11-22 (×2): qty 1

## 2016-11-22 MED ORDER — BUDESONIDE 0.5 MG/2ML IN SUSP
0.5000 mg | Freq: Two times a day (BID) | RESPIRATORY_TRACT | Status: DC
  Administered 2016-11-22 – 2016-11-23 (×3): 0.5 mg via RESPIRATORY_TRACT
  Filled 2016-11-22 (×3): qty 2

## 2016-11-22 MED ORDER — PREDNISONE 10 MG PO TABS
10.0000 mg | ORAL_TABLET | ORAL | Status: DC
  Filled 2016-11-22: qty 1

## 2016-11-22 MED ORDER — ALPRAZOLAM 0.25 MG PO TABS
0.2500 mg | ORAL_TABLET | Freq: Every evening | ORAL | Status: DC | PRN
  Administered 2016-11-22: 0.25 mg via ORAL
  Filled 2016-11-22 (×2): qty 1

## 2016-11-22 MED ORDER — IOPAMIDOL (ISOVUE-370) INJECTION 76%
60.0000 mL | Freq: Once | INTRAVENOUS | Status: AC | PRN
  Administered 2016-11-22: 60 mL via INTRAVENOUS

## 2016-11-22 MED ORDER — ACETAMINOPHEN 325 MG PO TABS: 650.0000 mg | ORAL_TABLET | Freq: Four times a day (QID) | ORAL | Status: DC | PRN

## 2016-11-22 MED ORDER — POTASSIUM CHLORIDE CRYS ER 20 MEQ PO TBCR
20.0000 meq | EXTENDED_RELEASE_TABLET | Freq: Two times a day (BID) | ORAL | Status: DC
  Administered 2016-11-22 – 2016-11-23 (×3): 20 meq via ORAL
  Filled 2016-11-22 (×3): qty 1

## 2016-11-22 MED ORDER — PANTOPRAZOLE SODIUM 40 MG PO TBEC
40.0000 mg | DELAYED_RELEASE_TABLET | Freq: Every day | ORAL | Status: DC
  Administered 2016-11-22 – 2016-11-23 (×2): 40 mg via ORAL
  Filled 2016-11-22 (×2): qty 1

## 2016-11-22 MED ORDER — NYSTATIN 100000 UNIT/ML MT SUSP
5.0000 mL | Freq: Four times a day (QID) | OROMUCOSAL | Status: DC
  Administered 2016-11-22 – 2016-11-23 (×4): 500000 [IU] via ORAL
  Filled 2016-11-22 (×4): qty 5

## 2016-11-22 MED ORDER — ORAL CARE MOUTH RINSE
15.0000 mL | Freq: Two times a day (BID) | OROMUCOSAL | Status: DC
  Administered 2016-11-23: 15 mL via OROMUCOSAL

## 2016-11-22 MED ORDER — ACETAMINOPHEN 650 MG RE SUPP: 650.0000 mg | Freq: Four times a day (QID) | RECTAL | Status: DC | PRN

## 2016-11-22 MED ORDER — DILTIAZEM HCL ER COATED BEADS 120 MG PO CP24
300.0000 mg | ORAL_CAPSULE | Freq: Every day | ORAL | Status: DC
  Administered 2016-11-22 – 2016-11-23 (×2): 300 mg via ORAL
  Filled 2016-11-22 (×4): qty 1

## 2016-11-22 MED ORDER — ALBUTEROL SULFATE (2.5 MG/3ML) 0.083% IN NEBU: 3.0000 mL | INHALATION_SOLUTION | RESPIRATORY_TRACT | Status: DC | PRN

## 2016-11-22 MED ORDER — OXYCODONE HCL 5 MG PO TABS
5.0000 mg | ORAL_TABLET | ORAL | Status: DC | PRN
  Administered 2016-11-22 – 2016-11-23 (×2): 5 mg via ORAL
  Filled 2016-11-22 (×2): qty 1

## 2016-11-22 MED ORDER — ONDANSETRON HCL 4 MG/2ML IJ SOLN: 4.0000 mg | Freq: Four times a day (QID) | INTRAMUSCULAR | Status: DC | PRN

## 2016-11-22 MED ORDER — MAGNESIUM CITRATE PO SOLN
1.0000 | Freq: Once | ORAL | Status: DC | PRN
  Filled 2016-11-22: qty 296

## 2016-11-22 MED ORDER — PREDNISONE 10 MG PO TABS: 10.0000 mg | ORAL_TABLET | Freq: Four times a day (QID) | ORAL | Status: DC

## 2016-11-22 MED ORDER — METHYLPREDNISOLONE SODIUM SUCC 40 MG IJ SOLR
40.0000 mg | Freq: Two times a day (BID) | INTRAMUSCULAR | Status: DC
  Administered 2016-11-22 – 2016-11-23 (×3): 40 mg via INTRAVENOUS
  Filled 2016-11-22 (×4): qty 1

## 2016-11-22 MED ORDER — HEPARIN (PORCINE) IN NACL 100-0.45 UNIT/ML-% IJ SOLN: 1050.0000 [IU]/h | INTRAMUSCULAR | Status: DC

## 2016-11-22 MED ORDER — IPRATROPIUM-ALBUTEROL 0.5-2.5 (3) MG/3ML IN SOLN
3.0000 mL | RESPIRATORY_TRACT | Status: DC | PRN
  Administered 2016-11-22 (×2): 3 mL via RESPIRATORY_TRACT
  Filled 2016-11-22 (×2): qty 3

## 2016-11-22 NOTE — Progress Notes (Signed)
ANTICOAGULATION CONSULT NOTE - Initial Consult  Pharmacy Consult for heparin drip Indication: pulmonary embolus  Allergies  Allergen Reactions  . Belladonna Alkaloids Anaphylaxis and Other (See Comments)    Pt states that this medication makes her eyes cross.    . Dabigatran Other (See Comments)    Patient Measurements: Height: 5' 6"  (167.6 cm) Weight: 190 lb 1.6 oz (86.2 kg) IBW/kg (Calculated) : 59.3 Heparin Dosing Weight: 77kg  Vital Signs: Temp: 98.3 F (36.8 C) (03/07 0207) Temp Source: Oral (03/07 0207) BP: 140/60 (03/07 0207) Pulse Rate: 68 (03/07 0207)  Labs:  Recent Labs  11/21/16 1505 11/21/16 1816 11/21/16 2311 11/22/16 0321 11/22/16 0827  HGB 10.5*  --   --  9.2* 9.7*  HCT 31.9*  --   --  27.9* 29.5*  PLT 273  --   --  235 258  APTT  --   --  24  --   --   LABPROT  --   --  13.6  --   --   INR  --   --  1.04  --   --   HEPARINUNFRC  --   --  <0.10*  --  1.36*  CREATININE 1.73*  --   --  1.22*  --   TROPONINI 0.07* 0.06*  --  0.08* 0.08*    Estimated Creatinine Clearance: 41.4 mL/min (by C-G formula based on SCr of 1.22 mg/dL (H)).   Medical History: Past Medical History:  Diagnosis Date  . Atypical atrial flutter ([place]) 11/16/2013   a. CHA2DS2VASc = 5-->prev on xarelto, d/c'd due to bleeding skin tear;  c. 11/2013->Placed on flecainide.  . Cataract   . Chronic diastolic CHF (congestive heart failure) ([place])    a. 11/2013 Echo: EF 55-60%, mild AS/MR, mod dil LA/RA, PASP 85mHg.  .Marland KitchenChronic diastolic heart failure (HWren   . Collagen vascular disease (HRico   . COPD (chronic obstructive pulmonary disease) (HWest Livingston   . Essential hypertension, benign   . Hypokalemia   . Hypothyroidism   . 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 Lung cancer (HShady Shores   . Lung nodule    Followed by Dr. FRaul Del . PAF (paroxysmal atrial fibrillation) (HDelavan Lake    a. 11/2013 Echo: EF 55-60%, mild AS/MR, mod dil LA/RA, PASP 363mg;  b. CHA2DS2VASc = 5-->prev on  xarelto, d/c'd due to bleeding skin tear;  c. 11/2013->Placed on flecainide.    Medications:  Patient has apixaban in PTA meds but does not report in current med rec. Anti-Xa <0.1 on admission.  Assessment:  Goal of Therapy:  Heparin level 0.3-0.7 units/ml Monitor platelets by anticoagulation protocol: Yes   Plan:  4600 unit bolus and initial rate of 1300 units/hr. First heparin level 8 hours after start of infusion.  3/7 0827 HL supratherapeutic at 1.36. Spoke with nurse and instructed that heparin drip should be help for 1 hours and  and then resume drip at lower rate of heparin 1050units/hr. Will recheck heparin level in 8 hours.   ShPernell DuprePharmD, BCPS Clinical Pharmacist 11/22/2016 10:16 AM

## 2016-11-22 NOTE — Progress Notes (Signed)
Patient arrived to 2A Room 238. Patient denies pain and all questions answered. Patient oriented to unit and Fall Safety Plan signed. Skin assessment completed with Yasmin RN and skin intact. A&Ox4, VSS, and NSR on verified tele-box #[id number]. Nursing staff will continue to monitor for any changes in patient status. Earleen Reaper, RN

## 2016-11-22 NOTE — Progress Notes (Addendum)
ANTICOAGULATION CONSULT NOTE - Initial Consult  Pharmacy Consult for heparin drip Indication: pulmonary embolus  Allergies  Allergen Reactions  . Belladonna Alkaloids Anaphylaxis and Other (See Comments)    Pt states that this medication makes her eyes cross.    . Dabigatran Other (See Comments)    Patient Measurements: Height: 5' 6"  (167.6 cm) Weight: 187 lb (84.8 kg) IBW/kg (Calculated) : 59.3 Heparin Dosing Weight: 77kg  Vital Signs: Temp: 97.6 F (36.4 C) (03/06 1502) Temp Source: Oral (03/06 1502) BP: 133/64 (03/06 2130) Pulse Rate: 72 (03/06 2130)  Labs:  Recent Labs  11/21/16 1505 11/21/16 1816 11/21/16 2311  HGB 10.5*  --   --   HCT 31.9*  --   --   PLT 273  --   --   APTT  --   --  24  LABPROT  --   --  13.6  INR  --   --  1.04  HEPARINUNFRC  --   --  <0.10*  CREATININE 1.73*  --   --   TROPONINI 0.07* 0.06*  --     Estimated Creatinine Clearance: 28.9 mL/min (by C-G formula based on SCr of 1.73 mg/dL (H)).   Medical History: Past Medical History:  Diagnosis Date  . Atypical atrial flutter ([place]) 11/16/2013   a. CHA2DS2VASc = 5-->prev on xarelto, d/c'd due to bleeding skin tear;  c. 11/2013->Placed on flecainide.  . Cataract   . Chronic diastolic CHF (congestive heart failure) ([place])    a. 11/2013 Echo: EF 55-60%, mild AS/MR, mod dil LA/RA, PASP 47mHg.  .Marland KitchenChronic diastolic heart failure (HPatton Village   . Collagen vascular disease (HQuinnesec   . COPD (chronic obstructive pulmonary disease) (HRayne   . Essential hypertension, benign   . Hypokalemia   . Hypothyroidism   . 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 Lung cancer (HBreda   . Lung nodule    Followed by Dr. FRaul Del . PAF (paroxysmal atrial fibrillation) (HSc[place]    a. 11/2013 Echo: EF 55-60%, mild AS/MR, mod dil LA/RA, PASP 368mg;  b. CHA2DS2VASc = 5-->prev on xarelto, d/c'd due to bleeding skin tear;  c. 11/2013->Placed on flecainide.    Medications:  Patient has apixaban in PTA meds  but does not report in current med rec. Anti-Xa <0.1 on admission.  Assessment:  Goal of Therapy:  Heparin level 0.3-0.7 units/ml Monitor platelets by anticoagulation protocol: Yes   Plan:  4600 unit bolus and initial rate of 1300 units/hr. First heparin level 8 hours after start of infusion.  Roneisha Stern S 11/22/2016,12:36 AM

## 2016-11-22 NOTE — Discharge Instructions (Signed)
Heart Failure Clinic appointment on December 01, 2016 at 10:20am with Darylene Price, [place]. Please call [phone] to reschedule.

## 2016-11-22 NOTE — Care Management Obs Status (Signed)
[place] NOTIFICATION   Patient Details  Name: LAURRIE TOPPIN MRN: [id number] Date of Birth: 08/21/1935   Medicare Observation Status Notification Given:  Yes    Shelbie Ammons, RN 11/22/2016, 1:44 PM

## 2016-11-22 NOTE — Progress Notes (Signed)
PT Note  Patient Details Name: Toni [name] MRN: [id number] DOB: 1934/10/15   Updated Treatment Note:     Chart reviewed. Update from AM note: CT scan ruled out PE and heparin drip has been discontinued. Will attempt PT evaluation on next date as pt is available/appropriate.  Lyndel Safe Huprich PT, DPT   Huprich,Jason 11/22/2016, 4:07 PM

## 2016-11-22 NOTE — Progress Notes (Signed)
[facility] NAME: Toni [name]    MR#:  [id number]  DATE OF BIRTH:  12/27/1934  SUBJECTIVE:  breathing better this am  REVIEW OF SYSTEMS:    Review of Systems  Constitutional: Negative.  Negative for chills, fever and malaise/fatigue.  HENT: Negative.  Negative for ear discharge, ear pain, hearing loss, nosebleeds and sore throat.   Eyes: Negative.  Negative for blurred vision and pain.  Respiratory: Positive for shortness of breath (better). Negative for cough, hemoptysis and wheezing.   Cardiovascular: Negative.  Negative for chest pain, palpitations and leg swelling.  Gastrointestinal: Negative.  Negative for abdominal pain, blood in stool, diarrhea, nausea and vomiting.  Genitourinary: Negative.  Negative for dysuria.  Musculoskeletal: Negative.  Negative for back pain.  Skin: Negative.   Neurological: Negative for dizziness, tremors, speech change, focal weakness, seizures and headaches.  Endo/Heme/Allergies: Negative.  Does not bruise/bleed easily.  Psychiatric/Behavioral: Negative.  Negative for depression, hallucinations and suicidal ideas.    Tolerating Diet:yes      DRUG ALLERGIES:   Allergies  Allergen Reactions  . Belladonna Alkaloids Anaphylaxis and Other (See Comments)    Pt states that this medication makes her eyes cross.    . Dabigatran Other (See Comments)    VITALS:  Blood pressure 140/60, pulse 68, temperature 98.3 F (36.8 C), temperature source Oral, resp. rate 20, height '5\' 6"'$  (1.676 m), weight 86.2 kg (190 lb 1.6 oz), SpO2 94 %.  PHYSICAL EXAMINATION:   Physical Exam  Constitutional: She is oriented to person, place, and time and well-developed, well-nourished, and in no distress. No distress.  HENT:  Head: Normocephalic.  Eyes: No scleral icterus.  Neck: Normal range of motion. Neck supple. No JVD present. No tracheal deviation present.  Cardiovascular: Normal rate and regular rhythm.   Exam reveals no gallop and no friction rub.   Murmur heard. Pulmonary/Chest: Effort normal and breath sounds normal. No respiratory distress. She has no wheezes. She has no rales. She exhibits no tenderness.  Abdominal: Soft. Bowel sounds are normal. She exhibits no distension and no mass. There is no tenderness. There is no rebound and no guarding.  Musculoskeletal: Normal range of motion. She exhibits no edema.  Neurological: She is alert and oriented to person, place, and time.  Skin: Skin is warm. No rash noted. No erythema.  Psychiatric: Affect and judgment normal.      LABORATORY PANEL:   CBC  Recent Labs Lab 11/22/16 0827  WBC 6.3  HGB 9.7*  HCT 29.5*  PLT 258   ------------------------------------------------------------------------------------------------------------------  Chemistries   Recent Labs Lab 11/21/16 1505 11/22/16 0321  NA 140 140  K 4.1 3.7  CL 98* 100*  CO2 31 28  GLUCOSE 118* 116*  BUN 54* 55*  CREATININE 1.73* 1.22*  CALCIUM 8.3* 8.2*  AST 27  --   ALT 16  --   ALKPHOS 62  --   BILITOT 0.6  --    ------------------------------------------------------------------------------------------------------------------  Cardiac Enzymes  Recent Labs Lab 11/21/16 1816 11/22/16 0321 11/22/16 0827  TROPONINI 0.06* 0.08* 0.08*   ------------------------------------------------------------------------------------------------------------------  RADIOLOGY:  Dg Chest 2 View  Result Date: 11/21/2016 CLINICAL DATA:  Shortness of Breath EXAM: CHEST  2 VIEW COMPARISON:  11/12/2016 FINDINGS: Cardiac shadow is mildly prominent but stable. A hiatal hernia is again seen. The lungs are well aerated bilaterally. No focal infiltrate or sizable effusion is seen. Mild compression deformities of a chronic nature are noted in the thoracic  spine. Previously seen left upper lobe infiltrate has resolved in the interval. IMPRESSION: No active cardiopulmonary disease.  Electronically Signed   By: Inez Catalina M.D.   On: 11/21/2016 15:43   Ct Angio Chest Pe W Or Wo Contrast  Result Date: 11/22/2016 CLINICAL DATA:  known history of chronic diastolic CHF, COPD, hypertension, hypothyroidism, CKD stage II, paroxysmal atrial fib/a.flutter presents to the emergency department for evaluation of shortness of breath. EXAM: CT ANGIOGRAPHY CHEST WITH CONTRAST TECHNIQUE: Multidetector CT imaging of the chest was performed using the standard protocol during bolus administration of intravenous contrast. Multiplanar CT image reconstructions and MIPs were obtained to evaluate the vascular anatomy. CONTRAST:  Pt given 42m 370 isovue due to health. COMPARISON:  07/30/2016 FINDINGS: Cardiovascular: Left arm IV contrast administration. Innominate vein and SVC patent. Mild right atrial enlargement. Right ventricle is nondilated. Satisfactory opacification of pulmonary arteries noted, and there is no evidence of pulmonary emboli. Patent bilateral pulmonary veins drain into the left atrium. Left atrial enlargement. Mitral annulus calcifications. Scattered coronary calcifications. Aortic valve calcifications. 4.2 cm ascending aortic aneurysm. Adequate contrast opacification of the thoracic aorta with no evidence of dissection, or stenosis. There is classic 3-vessel brachiocephalic arch anatomy without proximal stenosis. Scattered calcified plaque through the aorta. The visualized proximal abdominal aorta is unremarkable. Mediastinum/Nodes: No enlarged mediastinal, hilar, or axillary lymph nodes. Thyroid gland, trachea, and esophagus demonstrate no significant findings. Moderate hiatal hernia. Lungs/Pleura: No pleural effusion. No pneumothorax. 1 cm noncalcified nodule, superior segment left lower lobe, previously 9 mm on 04/06/2014. Poorly marginated somewhat geographic ground-glass opacities in both upper lobes, more conspicuous than on prior studies. No new nodule. Upper Abdomen: Hiatal hernia.  No  acute findings. Musculoskeletal: Mild compression deformities at T5, T7, and T8, present on prior study. Spondylitic changes in the lower thoracic and visualized upper lumbar spine. No new fracture or worrisome bone lesion. Review of the MIP images confirms the above findings. IMPRESSION: 1. Negative for acute PE or thoracic aortic dissection. 2. Coronary and aortic calcifications. 3. 4.2 cm ascending aortic aneurysm. Recommend annual imaging followup by CTA or MRA. This recommendation follows 2010 ACCF/AHA/AATS/ACR/ASA/SCA/SCAI/SIR/STS/SVM Guidelines for the Diagnosis and Management of Patients with Thoracic Aortic Disease. Circulation. 2010; 121: eX323-F5734. Little change in 1 cm left lower lobe nodule since 2015. 5. Hiatal hernia Electronically Signed   By: DLucrezia EuropeM.D.   On: 11/22/2016 10:23   Nm Pulmonary Perf And Vent  Result Date: 11/21/2016 CLINICAL DATA:  Shortness of breath for several days EXAM: NUCLEAR MEDICINE VENTILATION - PERFUSION LUNG SCAN TECHNIQUE: Ventilation images were obtained in multiple projections using inhaled aerosol Tc-913mTPA. Perfusion images were obtained in multiple projections after intravenous injection of Tc-9980mA. RADIOPHARMACEUTICALS:  32.586 mCi Technetium-28m36mA aerosol inhalation and 3.9 mCi Technetium-28m 63mIV COMPARISON:  Radiograph 11/21/2016, CT chest 07/19/2016 FINDINGS: Ventilation: Very heterogenous ventilation with marked central clumping of radiotracer, consistent with obstructive disease. This limits evaluation for PE. Perfusion: Single moderate wedge shaped peripheral defect in the right upper lobe. Mild diffuse heterogeneous perfusion. IMPRESSION: 1. Markedly heterogeneous ventilation with significant central clumping of radiotracer activity, consistent with obstructive lung disease. This limits assessment for PE 2. Single moderate wedge-shaped peripheral perfusion defect in the right upper lobe ; overall intermediate probability for PE.  Electronically Signed   By: Kim  Donavan Foil   On: 11/21/2016 21:17   Us VeKoreaus Img Lower Bilateral  Result Date: 11/22/2016 CLINICAL DATA:  History of pulmonary embolus and prior DVT EXAM: BILATERAL  LOWER EXTREMITY VENOUS DOPPLER ULTRASOUND TECHNIQUE: Gray-scale sonography with graded compression, as well as color Doppler and duplex ultrasound were performed to evaluate the lower extremity deep venous systems from the level of the common femoral vein and including the common femoral, femoral, profunda femoral, popliteal and calf veins including the posterior tibial, peroneal and gastrocnemius veins when visible. The superficial great saphenous vein was also interrogated. Spectral Doppler was utilized to evaluate flow at rest and with distal augmentation maneuvers in the common femoral, femoral and popliteal veins. COMPARISON:  10/16/2013 FINDINGS: RIGHT LOWER EXTREMITY Common Femoral Vein: No evidence of thrombus. Normal compressibility, respiratory phasicity and response to augmentation. Saphenofemoral Junction: No evidence of thrombus. Normal compressibility and flow on color Doppler imaging. Profunda Femoral Vein: No evidence of thrombus. Normal compressibility and flow on color Doppler imaging. Femoral Vein: No evidence of thrombus. Normal compressibility, respiratory phasicity and response to augmentation. Popliteal Vein: No evidence of thrombus. Normal compressibility, respiratory phasicity and response to augmentation. Calf Veins: No evidence of thrombus. Normal compressibility and flow on color Doppler imaging. Superficial Great Saphenous Vein: No evidence of thrombus. Normal compressibility and flow on color Doppler imaging. Venous Reflux:  None. Other Findings:  None. LEFT LOWER EXTREMITY Common Femoral Vein: No evidence of thrombus. Normal compressibility, respiratory phasicity and response to augmentation. Saphenofemoral Junction: No evidence of thrombus. Normal compressibility and flow on color  Doppler imaging. Profunda Femoral Vein: No evidence of thrombus. Normal compressibility and flow on color Doppler imaging. Femoral Vein: No evidence of thrombus. Normal compressibility, respiratory phasicity and response to augmentation. Popliteal Vein: No evidence of thrombus. Normal compressibility, respiratory phasicity and response to augmentation. Calf Veins: No definite evidence of thrombus. Suboptimal evaluation of the left calf veins. Superficial Great Saphenous Vein: No evidence of thrombus. Normal compressibility and flow on color Doppler imaging. Venous Reflux:  None. Other Findings:  None. IMPRESSION: No evidence of deep venous thrombosis bilateral lower extremity. Electronically Signed   By: Lahoma Crocker M.D.   On: 11/22/2016 10:38     ASSESSMENT AND PLAN:   82 year old female with history of PAF, chronic diastolic heart failure and COPD on nocturnal oxygen who was recently discharged from the hospital with COPD/pneumonia and CHF exacerbation who presented shortness of breath.  1. Acute dyspnea: Initially thought to have PE with VQ scan however today's CT scan shows no evidence PE. Dopplers are negative for DVT. Stop heparin drip  Start IV steroids for acute COPD exacerbation  2. Chronic diastolic heart failure without exacerbation: Continue torsemide Obtain cardiology consultation 3. Hypothyroid: Continue Synthroid  4. 4.2 cm ascending aortic aneurysm. Recommend annual imaging followup by CTA or MRA.  5. PAF: Due to anemia anticoagulation was discontinued during last hospital stay Continue diltiazem for heart rate control   Management plans discussed with the patient and she is in agreement.  CODE STATUS: full  TOTAL TIME TAKING CARE OF THIS PATIENT: 30 minutes.     POSSIBLE D/C tomorrow, DEPENDING ON CLINICAL CONDITION.   Roland Prine M.D on 11/22/2016 at 11:07 AM  Between 7am to 6pm - Pager - [phone] After 6pm go to [email or web address] - password [email or web address] [facility]  Office  [phone]  CC: Primary care physician; Pcp Not In System  Note: This dictation was prepared with Dragon dictation along with smaller phrase technology. Any transcriptional errors that result from this process are unintentional.

## 2016-11-22 NOTE — H&P (Addendum)
History and Physical   [facility] -  @ [facility] Admission History and Physical [employer], D.O.    Patient Name: Toni [name] MR#: [id number] Date of Birth: 04-Sep-1935 Date of Admission: 11/21/2016  Referring MD/NP/PA: Dr. Joni Fears Primary Care Physician: Pcp Not In System Patient coming from: Home Outpatient Specialists: Dr. Mortimer Fries, Dr. Neuro, Dr. Grayland Ormond,   Chief Complaint:  Chief Complaint  Patient presents with  . Shortness of Breath    HPI: Toni [name] is a 82 y.o. female with a known history of chronic diastolic CHF, COPD, hypertension, hypothyroidism, CKD stage II, paroxysmal atrial fib/a.flutter presents to the emergency department for evaluation of shortness of breath.  Patient was in a usual state of health until this afternoon when she noticed gradually worsening shortness of breath requiring more than her usual 2 L of home O2. Symptoms are worse with exertion.  Patient denies fevers/chills, weakness, dizziness, chest pain, N/V/C/D, abdominal pain, dysuria/frequency, changes in mental status.  Of note, patient was recently hospitalized for congestive heart failure and discharged home from the hospital yesterday..   Otherwise there has been no change in status. Patient has been taking medication as prescribed and there has been no recent change in medication or diet.  No recent antibiotics.  There has been no recent illness, travel or sick contacts.    ED Course: Patient received Heparin, nebulizer  Review of Systems:  CONSTITUTIONAL: No fever/chills, fatigue, weakness, weight gain/loss, headache. EYES: No blurry or double vision. ENT: No tinnitus, postnasal drip, redness or soreness of the oropharynx. RESPIRATORY: No cough, wheeze.  No hemoptysis. Positive dyspnea CARDIOVASCULAR: No chest pain, palpitations, syncope, orthopnea. No lower extremity edema.  GASTROINTESTINAL: No nausea, vomiting, abdominal pain, diarrhea, constipation.  No hematemesis,  melena or hematochezia. GENITOURINARY: No dysuria, frequency, hematuria. ENDOCRINE: No polyuria or nocturia. No heat or cold intolerance. HEMATOLOGY: No anemia, bruising, bleeding. INTEGUMENTARY: No rashes, ulcers, lesions. MUSCULOSKELETAL: No arthritis, gout, dyspnea. NEUROLOGIC: No numbness, tingling, ataxia, seizure-type activity, weakness. PSYCHIATRIC: No anxiety, depression, insomnia.   Past Medical History:  Diagnosis Date  . Atypical atrial flutter ([place]) 11/16/2013   a. CHA2DS2VASc = 5-->prev on xarelto, d/c'd due to bleeding skin tear;  c. 11/2013->Placed on flecainide.  . Cataract   . Chronic diastolic CHF (congestive heart failure) ([place])    a. 11/2013 Echo: EF 55-60%, mild AS/MR, mod dil LA/RA, PASP 57mHg.  .[place]ronic diastolic heart failure ([place]   . Collagen vascular disease ([place]   . COPD (chronic obstructive pulmonary disease) ([place]   . Essential hypertension, benign   . Hypokalemia   . Hypothyroidism   . 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 Lung cancer ([place]   . Lung nodule    Followed by Dr. FRaul Del . PAF (paroxysmal atrial fibrillation) ([place]    a. 11/2013 Echo: EF 55-60%, mild AS/MR, mod dil LA/RA, PASP 330mg;  b. CHA2DS2VASc = 5-->prev on xarelto, d/c'd due to bleeding skin tear;  c. 11/2013->Placed on flecainide.    Past Surgical History:  Procedure Laterality Date  . APPENDECTOMY    . EYE SURGERY    . RIGHT HEART CATH N/A 11/17/2016   Procedure: Right Heart Cath;  Surgeon: MuWellington HampshireMD;  [facility];  Service: Cardiovascular;  Laterality: N/A;  . TOTAL KNEE ARTHROPLASTY Bilateral      reports that she quit smoking about 14 years ago. Her smoking use included Cigarettes. She has a 75.00 pack-year smoking history. She has  never used smokeless tobacco. She reports that she does not drink alcohol or use drugs.  Allergies  Allergen Reactions  . Belladonna Alkaloids Anaphylaxis and Other (See Comments)    Pt  states that this medication makes her eyes cross.    . Dabigatran Other (See Comments)    Family History  Problem Relation Age of Onset  . CAD Father   . Heart disease Father   . Heart attack Father   . Arrhythmia Sister    Family history has been reviewed and confirmed with patient.   Prior to Admission medications   Medication Sig Start Date End Date Taking? Authorizing Provider  albuterol (PROVENTIL HFA) 108 (90 Base) MCG/ACT inhaler Inhale 2 puffs into the lungs every 4 (four) hours as needed for wheezing or shortness of breath. 07/23/16  Yes Carrie Mew, MD  ALPRAZolam Duanne Moron) 0.25 MG tablet Take 1 tablet (0.25 mg total) by mouth at bedtime as needed for anxiety. 11/19/16  Yes Epifanio Lesches, MD  budesonide (PULMICORT) 0.5 MG/2ML nebulizer solution Take 2 mLs (0.5 mg total) by nebulization 2 (two) times daily. 06/08/15  Yes Flora Lipps, MD  cyanocobalamin (,VITAMIN B-12,) 1000 MCG/ML injection Vit B12 1000 mcg IM once a week for 4 weeks then once a month for 4 months 10/09/16  Yes Historical Provider, MD  diltiazem (CARDIZEM CD) 300 MG 24 hr capsule Take 300 mg by mouth daily.   Yes Historical Provider, MD  formoterol (PERFOROMIST) 20 MCG/2ML nebulizer solution Take 2 mLs (20 mcg total) by nebulization 2 (two) times daily. 06/08/15  Yes Flora Lipps, MD  ipratropium-albuterol (DUONEB) 0.5-2.5 (3) MG/3ML SOLN Take 3 mLs by nebulization every 4 (four) hours as needed (for wheezing/shortness of breath). DX Code: J44.9   DX: COPD 08/29/16  Yes Wilhelmina Mcardle, MD  levothyroxine (SYNTHROID, LEVOTHROID) 75 MCG tablet Take 75 mcg by mouth daily before breakfast.   Yes Historical Provider, MD  nystatin (MYCOSTATIN) 100000 UNIT/ML suspension Take 5 mLs (500,000 Units total) by mouth 4 (four) times daily. 11/19/16  Yes Epifanio Lesches, MD  omega-3 acid ethyl esters (LOVAZA) 1 g capsule Take 1 g by mouth daily.   Yes Historical Provider, MD  omeprazole (PRILOSEC) 20 MG capsule Take 20 mg by  mouth 2 (two) times daily before a meal.   Yes Historical Provider, MD  potassium chloride (K-DUR,KLOR-CON) 10 MEQ tablet Take 2 tablets (20 mEq total) by mouth 2 (two) times daily. 08/03/16  Yes Minna Merritts, MD  predniSONE (STERAPRED UNI-PAK 21 TAB) 10 MG (21) TBPK tablet Taper by 10 mg daily 11/19/16  Yes Epifanio Lesches, MD  torsemide (DEMADEX) 20 MG tablet Take 2 tablets (40 mg total) by mouth 2 (two) times daily. 11/19/16  Yes Epifanio Lesches, MD  loratadine (CLARITIN) 10 MG tablet Take 10 mg by mouth daily.    Historical Provider, MD    Physical Exam: Vitals:   11/21/16 1800 11/21/16 1830 11/21/16 2100 11/21/16 2130  BP: 116/88 117/68 (!) 142/70 133/64  Pulse: 65 (!) 46 66 72  Resp: 18 17 18 17   Temp:      TempSrc:      SpO2: 99% 98% 100% 98%  Weight:      Height:        GENERAL: 82 y.o.-year-old White female patient, well-developed, well-nourished lying in the bed in no acute distress.  Pleasant and cooperative.   HEENT: Head atraumatic, normocephalic. Pupils equal, round, reactive to light and accommodation. No scleral icterus. Extraocular muscles intact. Nares are  patent. Oropharynx is clear. Mucus membranes moist. NECK: Supple, full range of motion. No JVD, no bruit heard. No thyroid enlargement, no tenderness, no cervical lymphadenopathy. CHEST: Normal breath sounds bilaterally. Mild diffuse expiratory wheeze. No use of accessory muscles of respiration.  No reproducible chest wall tenderness.  CARDIOVASCULAR: S1, S2 normal. No murmurs, rubs, or gallops. Cap refill <2 seconds. Pulses intact distally.  ABDOMEN: Soft, nondistended, nontender. No rebound, guarding, rigidity. Normoactive bowel sounds present in all four quadrants. No organomegaly or mass. EXTREMITIES: No pedal edema, cyanosis, or clubbing. No calf tenderness or Homan's sign.  NEUROLOGIC: The patient is alert and oriented x 3. Cranial nerves II through XII are grossly intact with no focal sensorimotor  deficit. Muscle strength 5/5 in all extremities. Sensation intact. Gait not checked. PSYCHIATRIC:  Normal affect, mood, thought content. SKIN: Warm, dry, and intact without obvious rash, lesion, or ulcer.    Labs on Admission:  CBC:  Recent Labs Lab 11/15/16 0323 11/16/16 0505 11/18/16 0506 11/21/16 1505  WBC 9.9 10.4 7.9 8.6  NEUTROABS  --   --   --  7.8*  HGB 9.6* 9.4* 8.8* 10.5*  HCT 28.7* 28.4* 27.0* 31.9*  MCV 87.5 86.6 87.4 87.4  PLT 387 375 302 300   Basic Metabolic Panel:  Recent Labs Lab 11/15/16 0323 11/16/16 0505 11/18/16 0506 11/21/16 1505  NA 139 138 138 140  K 3.7 4.2 4.4 4.1  CL 96* 97* 100* 98*  CO2 36* 34* 33* 31  GLUCOSE 114* 95 88 118*  BUN 52* 57* 50* 54*  CREATININE 1.21* 1.53* 1.32* 1.73*  CALCIUM 8.2* 8.6* 8.0* 8.3*   GFR: Estimated Creatinine Clearance: 28.9 mL/min (by C-G formula based on SCr of 1.73 mg/dL (H)). Liver Function Tests:  Recent Labs Lab 11/21/16 1505  AST 27  ALT 16  ALKPHOS 62  BILITOT 0.6  PROT 6.5  ALBUMIN 3.9   No results for input(s): LIPASE, AMYLASE in the last 168 hours. No results for input(s): AMMONIA in the last 168 hours. Coagulation Profile:  Recent Labs Lab 11/17/16 0639 11/21/16 2311  INR 1.22 1.04   Cardiac Enzymes:  Recent Labs Lab 11/21/16 1505 11/21/16 1816  TROPONINI 0.07* 0.06*   BNP (last 3 results) No results for input(s): PROBNP in the last 8760 hours. HbA1C: No results for input(s): HGBA1C in the last 72 hours. CBG: No results for input(s): GLUCAP in the last 168 hours. Lipid Profile: No results for input(s): CHOL, HDL, LDLCALC, TRIG, CHOLHDL, LDLDIRECT in the last 72 hours. Thyroid Function Tests: No results for input(s): TSH, T4TOTAL, FREET4, T3FREE, THYROIDAB in the last 72 hours. Anemia Panel: No results for input(s): VITAMINB12, FOLATE, FERRITIN, TIBC, IRON, RETICCTPCT in the last 72 hours. Urine analysis:    Component Value Date/Time   COLORURINE Yellow 12/09/2013  1647   APPEARANCEUR Clear 12/09/2013 1647   LABSPEC 1.008 12/09/2013 1647   PHURINE 7.0 12/09/2013 1647   GLUCOSEU Negative 12/09/2013 1647   HGBUR Negative 12/09/2013 1647   BILIRUBINUR Negative 12/09/2013 1647   KETONESUR Negative 12/09/2013 1647   PROTEINUR Negative 12/09/2013 1647   NITRITE Negative 12/09/2013 1647   LEUKOCYTESUR Trace 12/09/2013 1647   Sepsis Labs: @LABRCNTIP (procalcitonin:4,lacticidven:4) )No results found for this or any previous visit (from the past 240 hour(s)).   Radiological Exams on Admission: Dg Chest 2 View  Result Date: 11/21/2016 CLINICAL DATA:  Shortness of Breath EXAM: CHEST  2 VIEW COMPARISON:  11/12/2016 FINDINGS: Cardiac shadow is mildly prominent but stable. A hiatal hernia is  again seen. The lungs are well aerated bilaterally. No focal infiltrate or sizable effusion is seen. Mild compression deformities of a chronic nature are noted in the thoracic spine. Previously seen left upper lobe infiltrate has resolved in the interval. IMPRESSION: No active cardiopulmonary disease. Electronically Signed   By: Inez Catalina M.D.   On: 11/21/2016 15:43   Nm Pulmonary Perf And Vent  Result Date: 11/21/2016 CLINICAL DATA:  Shortness of breath for several days EXAM: NUCLEAR MEDICINE VENTILATION - PERFUSION LUNG SCAN TECHNIQUE: Ventilation images were obtained in multiple projections using inhaled aerosol Tc-69mDTPA. Perfusion images were obtained in multiple projections after intravenous injection of Tc-958mAA. RADIOPHARMACEUTICALS:  32.586 mCi Technetium-9942mPA aerosol inhalation and 3.9 mCi Technetium-29m77m IV COMPARISON:  Radiograph 11/21/2016, CT chest 07/19/2016 FINDINGS: Ventilation: Very heterogenous ventilation with marked central clumping of radiotracer, consistent with obstructive disease. This limits evaluation for PE. Perfusion: Single moderate wedge shaped peripheral defect in the right upper lobe. Mild diffuse heterogeneous perfusion. IMPRESSION: 1.  Markedly heterogeneous ventilation with significant central clumping of radiotracer activity, consistent with obstructive lung disease. This limits assessment for PE 2. Single moderate wedge-shaped peripheral perfusion defect in the right upper lobe ; overall intermediate probability for PE. Electronically Signed   By: Kim Donavan Foil.   On: 11/21/2016 21:17    EKG: Normal sinus rhythm at 104 bpm with normal axis and nonspecific ST-T wave changes.   Right heart catheterization 11/17/16 Physicians   Panel Physicians Referring Physician Case Authorizing Physician  MuhaWellington Hampshire (Primary)  RyanRise Mu-C  Procedures   Right Heart Cath  Conclusion   Mild to moderately elevated filling pressures with moderate pulmonary hypertension and normal cardiac output.  RA: 10 mmHg RV : 36 / 6 mmHg PA: 44/19 with a mean of 31 mmHg Pulmonary capillary wedge pressure: 22 mmHg with a prominent V wave suggestive of significant mitral regurgitation Cardiac output was 5.92 with a cardiac index of 2.96  Recommendations: The patient continues to be volume overloaded. I recommend continuing IV furosemide for at least another day. These numbers do not fully explain her degree of shortness of breath which I suspect is multifactorial due to  cardiac and pulmonary disease.  The patient likely has significant aortic stenosis and mitral regurgitation but I doubt that she will be a good candidate for dual valve surgery or even TAVR alone.  I suspect that she has significant pulmonary disease related to prolonged history of smoking. The patient had heart time tolerating the procedure due to severe back pain and it will be difficult to imagine her tolerating something more extensive than this.     Assessment/Plan  This is a 81 y41. female with a history of chronic diastolic CHF, COPD, hypertension, hypothyroidism, CKD stage II, paroxysmal atrial fib/a.flutter now being admitted with:  #. Pulmonary  embolism - Admit inpatient - IV heparin - O2 and med neb therapy as needed -Check echo and bilateral lower extremity Dopplers  #. Elevated troponin likely secondary to #1 -Monitor on telemetry -Trend troponins  #. AKI on CKD stage II -Monitor BMP in a.m. -Given CHF may consider very gentle IV fluid hydration if necessary  #. History of chronic diastolic CHF - Continue torsemide,  #. History of home O2 dependent COPD with chronic respiratory failure - Continue Pulmicort, Perforomist, Claritin -Continue by mouth prednisone taper starting at 50 mg and decreasing by 10 daily  #. History of hypertension - Continue Cardizem  #. History of hypothyroidism -  Continue Synthroid  #. History of paroxysmal atrial fibrillation - Continue Cardizem  Admission status: Inpatient IV Fluids: Hep-Lock Diet/Nutrition: Heart healthy Consults called: None  DVT Px: Heparin and early ambulation. May apply SCDs if Doppler studies are negative for DVT Code Status: Full Code  Disposition Plan: To home in 1-2 days  All the records are reviewed and case discussed with ED provider. Management plans discussed with the patient and/or family who express understanding and agree with plan of care.  Bradshaw Minihan D.O. on 11/22/2016 at 1:05 AM Between 7am to 6pm - Pager - [phone] After 6pm go to [email or web address] - Proofreader [facility] [place] [facility] Office [phone] CC: Primary care physician; Pcp Not In System   11/22/2016, 1:05 AM

## 2016-11-22 NOTE — Care Management (Signed)
Admitted to [facility] with the diagnosis of Pulmonary Embolism under observation status, lives with husband, Darnell Level. Appointment to see Dr. Laverna Peace at [facility] is scheduled for 12/05/16. Receiving home health per [facility] now. No skilled facility. Home Oxygen per [facility] 2-3 years. 2 liters per nasal cannula as needed. Prescriptions are filled at [facility]. Takes care of all basic activities of daily living herself, drives. No falls. Good - Fair  Appetite. Shelbie Ammons RN MSN Care Management

## 2016-11-22 NOTE — Consult Note (Signed)
Cardiology Consultation Note  Patient ID: Toni [name], MRN: [id number], DOB/AGE: 1935/04/29 82 y.o. Admit date: 11/21/2016   Date of Consult: 11/22/2016 Primary Physician: Pcp Not In System Primary Cardiologist: Rockey Situ, tim  Chief Complaint: Acute Shortness of breath Reason for Consult: Moderate aortic valve stenosis, COPD, bronchitis, shortness of breath Physician requesting consult:  Dr. Benjie Karvonen   HPI: 82 y.o. female with  h/o paroxysmal atrial flutter on eliquis, chronic diastolic CHF, COPD on nocturnal home oxygen, hypertension, hypothyroidism, CKD stage II presents to hospital secondary to worsening shortness of breath. Patient with recent hospital admission with anemia, acute on chronic systolic CHF, COPD requiring long period of respiratory support, diuresis  She went home on 11/19/2016, denied shortness of breath through the weekend prior to discharge, was walking without difficulty, dramatic improvement in her leg swelling. Reports that she was in her usual state of health at home, home nursing came in 11 AM yesterday, and shortly after had shortness of breath and could not recover. She tried albuterol, nebulizers, higher rate of oxygen by nasal cannula. Still could not catch her breath. Visiting nurse called her supervisor who recommended that she go to the emergency room.   Husband reports shortness of breath symptoms seem to calm on acutely. There was concern of pulmonary embolism and she had VQ scan in the emergency room. This was read as intermediate. She had improvement in her renal function on lab work this morning and went for CTA chest showing no pulmonary embolism, DVT in her legs  This morning she reports having cough, congestion. Chest x-ray reviewed personally by myself with possible infiltrate left lung base, read as possible early pneumonia, unable to exclude edema.  She did take her torsemide 40 mg twice a day at home Husband reports she did not have excessive fluid  intake Patient denies any change in her weight, weight was 192 pounds at home and in fact was down 3 pounds on her second day at home. He did not climb Review of lab work shows stable hematocrit and slightly improved renal function     Past Medical History:  Diagnosis Date  . Atypical atrial flutter ([place]) 11/16/2013   a. CHA2DS2VASc = 5-->prev on xarelto, d/c'd due to bleeding skin tear;  c. 11/2013->Placed on flecainide.  . Cataract   . Chronic diastolic CHF (congestive heart failure) ([place])    a. 11/2013 Echo: EF 55-60%, mild AS/MR, mod dil LA/RA, PASP 29mHg.  .Marland KitchenChronic diastolic heart failure ([place]   . Collagen vascular disease ([place]   . COPD [place] pulmonary disease) [place]   . Essential hypertension, benign   . Hypokalemia   . Hypothyroidism   . 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 Lung cancer ([facility]   . Lung nodule    Followed by Dr. FRaul Del . PAF (paroxysmal atrial fibrillation) ([place]    a. 11/2013 Echo: EF 55-60%, mild AS/MR, mod dil LA/RA, PASP 347mg;  b. CHA2DS2VASc = 5-->prev on xarelto, d/c'd due to bleeding skin tear;  c. 11/2013->Placed on flecainide.      Most Recent Cardiac Studies: CT scan chest showing no pulmonary embolism , moderately dilated ascending aorta 4.2 cm    Surgical History:  Past Surgical History:  Procedure Laterality Date  . APPENDECTOMY    . EYE SURGERY    . RIGHT HEART CATH N/A 11/17/2016   Procedure: Right Heart Cath;  Surgeon: MuWellington HampshireMD;  [facility];  Service: Cardiovascular;  Laterality: N/A;  . TOTAL KNEE ARTHROPLASTY Bilateral      Home Meds: Prior to Admission medications   Medication Sig Start Date End Date Taking? Authorizing Provider  albuterol (PROVENTIL HFA) 108 (90 Base) MCG/ACT inhaler Inhale 2 puffs into the lungs every 4 (four) hours as needed for wheezing or shortness of breath. 07/23/16  Yes Carrie Mew, MD  ALPRAZolam Duanne Moron) 0.25 MG tablet Take 1 tablet  (0.25 mg total) by mouth at bedtime as needed for anxiety. 11/19/16  Yes Epifanio Lesches, MD  budesonide (PULMICORT) 0.5 MG/2ML nebulizer solution Take 2 mLs (0.5 mg total) by nebulization 2 (two) times daily. 06/08/15  Yes Flora Lipps, MD  cyanocobalamin (,VITAMIN B-12,) 1000 MCG/ML injection Vit B12 1000 mcg IM once a week for 4 weeks then once a month for 4 months 10/09/16  Yes Historical Provider, MD  diltiazem (CARDIZEM CD) 300 MG 24 hr capsule Take 300 mg by mouth daily.   Yes Historical Provider, MD  formoterol (PERFOROMIST) 20 MCG/2ML nebulizer solution Take 2 mLs (20 mcg total) by nebulization 2 (two) times daily. 06/08/15  Yes Flora Lipps, MD  ipratropium-albuterol (DUONEB) 0.5-2.5 (3) MG/3ML SOLN Take 3 mLs by nebulization every 4 (four) hours as needed (for wheezing/shortness of breath). DX Code: J44.9   DX: COPD 08/29/16  Yes Wilhelmina Mcardle, MD  levothyroxine (SYNTHROID, LEVOTHROID) 75 MCG tablet Take 75 mcg by mouth daily before breakfast.   Yes Historical Provider, MD  nystatin (MYCOSTATIN) 100000 UNIT/ML suspension Take 5 mLs (500,000 Units total) by mouth 4 (four) times daily. 11/19/16  Yes Epifanio Lesches, MD  omega-3 acid ethyl esters (LOVAZA) 1 g capsule Take 1 g by mouth daily.   Yes Historical Provider, MD  omeprazole (PRILOSEC) 20 MG capsule Take 20 mg by mouth 2 (two) times daily before a meal.   Yes Historical Provider, MD  potassium chloride (K-DUR,KLOR-CON) 10 MEQ tablet Take 2 tablets (20 mEq total) by mouth 2 (two) times daily. 08/03/16  Yes Minna Merritts, MD  predniSONE (STERAPRED UNI-PAK 21 TAB) 10 MG (21) TBPK tablet Taper by 10 mg daily 11/19/16  Yes Epifanio Lesches, MD  torsemide (DEMADEX) 20 MG tablet Take 2 tablets (40 mg total) by mouth 2 (two) times daily. 11/19/16  Yes Epifanio Lesches, MD  loratadine (CLARITIN) 10 MG tablet Take 10 mg by mouth daily.    Historical Provider, MD    Inpatient Medications:  . arformoterol  15 mcg Nebulization Q12H  .  budesonide  0.5 mg Nebulization BID  . diltiazem  300 mg Oral Daily  . furosemide  60 mg Intravenous Q12H  . levothyroxine  75 mcg Oral QAC breakfast  . loratadine  10 mg Oral Daily  . mouth rinse  15 mL Mouth Rinse BID  . methylPREDNISolone (SOLU-MEDROL) injection  40 mg Intravenous Q12H  . nystatin  5 mL Oral QID  . omega-3 acid ethyl esters  1 g Oral Daily  . pantoprazole  40 mg Oral Daily  . potassium chloride  20 mEq Oral BID  . sodium chloride flush  3 mL Intravenous Q12H     Allergies:  Allergies  Allergen Reactions  . Belladonna Alkaloids Anaphylaxis and Other (See Comments)    Pt states that this medication makes her eyes cross.    . Dabigatran Other (See Comments)    Social History   Social History  . Marital status: Married    Spouse name: N/A  . Number of children: N/A  . Years  of education: N/A   Occupational History  . Not on file.   Social History Main Topics  . Smoking status: Former Smoker    Packs/day: 1.50    Years: 50.00    Types: Cigarettes    Quit date: 09/18/2002  . Smokeless tobacco: Never Used  . Alcohol use No  . Drug use: No  . Sexual activity: Not on file   Other Topics Concern  . Not on file   Social History Narrative   Lives at home with husband. Independent at baseline.     Family History  Problem Relation Age of Onset  . CAD Father   . Heart disease Father   . Heart attack Father   . Arrhythmia Sister      Review of Systems: Review of Systems  Constitutional: Negative.   Respiratory: Positive for cough, shortness of breath and wheezing.   Cardiovascular: Positive for leg swelling.  Gastrointestinal: Negative.   Musculoskeletal: Negative.   Neurological: Negative.   Psychiatric/Behavioral: Negative.   All other systems reviewed and are negative.    Labs: CBC  Recent Labs  11/21/16 1505 11/22/16 0321 11/22/16 0827  WBC 8.6 5.6 6.3  NEUTROABS 7.8*  --   --   HGB 10.5* 9.2* 9.7*  HCT 31.9* 27.9* 29.5*  MCV  87.4 87.0 86.7  PLT 273 235 758   Basic Metabolic Panel  Recent Labs  11/21/16 1505 11/22/16 0321  NA 140 140  K 4.1 3.7  CL 98* 100*  CO2 31 28  GLUCOSE 118* 116*  BUN 54* 55*  CREATININE 1.73* 1.22*  CALCIUM 8.3* 8.2*   Liver Function Tests  Recent Labs  11/21/16 1505  AST 27  ALT 16  ALKPHOS 62  BILITOT 0.6  PROT 6.5  ALBUMIN 3.9   No results for input(s): LIPASE, AMYLASE in the last 72 hours. Cardiac Enzymes  Recent Labs  11/21/16 1816 11/22/16 0321 11/22/16 0827  TROPONINI 0.06* 0.08* 0.08*   BNP Invalid input(s): POCBNP D-Dimer No results for input(s): DDIMER in the last 72 hours. Hemoglobin A1C No results for input(s): HGBA1C in the last 72 hours. Fasting Lipid Panel No results for input(s): CHOL, HDL, LDLCALC, TRIG, CHOLHDL, LDLDIRECT in the last 72 hours. Thyroid Function Tests No results for input(s): TSH, T4TOTAL, T3FREE, THYROIDAB in the last 72 hours.  Invalid input(s): FREET3  Radiology/Studies:  Dg Chest 2 View  Result Date: 11/21/2016 CLINICAL DATA:  Shortness of Breath EXAM: CHEST  2 VIEW COMPARISON:  11/12/2016 FINDINGS: Cardiac shadow is mildly prominent but stable. A hiatal hernia is again seen. The lungs are well aerated bilaterally. No focal infiltrate or sizable effusion is seen. Mild compression deformities of a chronic nature are noted in the thoracic spine. Previously seen left upper lobe infiltrate has resolved in the interval. IMPRESSION: No active cardiopulmonary disease. Electronically Signed   By: Inez Catalina M.D.   On: 11/21/2016 15:43   Dg Chest 2 View  Result Date: 11/12/2016 CLINICAL DATA:  Nonproductive cough and shortness of breath with exertion. History of lung cancer. Patient admitted 11/03/2016 for chest pressure and shortness of breath. EXAM: CHEST  2 VIEW COMPARISON:  Single-view of the chest 11/06/2016 and 11/03/2016. FINDINGS: Bilateral airspace disease, worst in the left mid and upper lung zones, has improved. No  pneumothorax is identified. Heart size enlarged. There are likely trace bilateral pleural effusions. IMPRESSION: Improved airspace disease, particularly in the left upper lobe. No new abnormality. Emphysema. Cardiomegaly. Atherosclerosis. Hiatal hernia. Electronically Signed  By: Inge Rise M.D.   On: 11/12/2016 11:12   Dg Chest 2 View  Result Date: 11/03/2016 CLINICAL DATA:  Acute onset of shortness of breath. Initial encounter. EXAM: CHEST  2 VIEW COMPARISON:  Chest radiograph and CTA of the chest performed 07/30/2016 FINDINGS: The lungs are well-aerated. Vascular congestion is noted. Bilateral central airspace opacification may reflect pulmonary edema or pneumonia. There is no evidence of pleural effusion or pneumothorax. The heart is borderline normal in size. No acute osseous abnormalities are seen. A moderate hiatal hernia is noted. IMPRESSION: 1. Vascular congestion noted. Bilateral central airspace opacification may reflect pulmonary edema or pneumonia. 2. Moderate hiatal hernia seen. Electronically Signed   By: Garald Balding M.D.   On: 11/03/2016 17:09   Ct Angio Chest Pe W Or Wo Contrast  Result Date: 11/22/2016 CLINICAL DATA:  known history of chronic diastolic CHF, COPD, hypertension, hypothyroidism, CKD stage II, paroxysmal atrial fib/a.flutter presents to the emergency department for evaluation of shortness of breath. EXAM: CT ANGIOGRAPHY CHEST WITH CONTRAST TECHNIQUE: Multidetector CT imaging of the chest was performed using the standard protocol during bolus administration of intravenous contrast. Multiplanar CT image reconstructions and MIPs were obtained to evaluate the vascular anatomy. CONTRAST:  Pt given 48m 370 isovue due to health. COMPARISON:  07/30/2016 FINDINGS: Cardiovascular: Left arm IV contrast administration. Innominate vein and SVC patent. Mild right atrial enlargement. Right ventricle is nondilated. Satisfactory opacification of pulmonary arteries noted, and there is  no evidence of pulmonary emboli. Patent bilateral pulmonary veins drain into the left atrium. Left atrial enlargement. Mitral annulus calcifications. Scattered coronary calcifications. Aortic valve calcifications. 4.2 cm ascending aortic aneurysm. Adequate contrast opacification of the thoracic aorta with no evidence of dissection, or stenosis. There is classic 3-vessel brachiocephalic arch anatomy without proximal stenosis. Scattered calcified plaque through the aorta. The visualized proximal abdominal aorta is unremarkable. Mediastinum/Nodes: No enlarged mediastinal, hilar, or axillary lymph nodes. Thyroid gland, trachea, and esophagus demonstrate no significant findings. Moderate hiatal hernia. Lungs/Pleura: No pleural effusion. No pneumothorax. 1 cm noncalcified nodule, superior segment left lower lobe, previously 9 mm on 04/06/2014. Poorly marginated somewhat geographic ground-glass opacities in both upper lobes, more conspicuous than on prior studies. No new nodule. Upper Abdomen: Hiatal hernia.  No acute findings. Musculoskeletal: Mild compression deformities at T5, T7, and T8, present on prior study. Spondylitic changes in the lower thoracic and visualized upper lumbar spine. No new fracture or worrisome bone lesion. Review of the MIP images confirms the above findings. IMPRESSION: 1. Negative for acute PE or thoracic aortic dissection. 2. Coronary and aortic calcifications. 3. 4.2 cm ascending aortic aneurysm. Recommend annual imaging followup by CTA or MRA. This recommendation follows 2010 ACCF/AHA/AATS/ACR/ASA/SCA/SCAI/SIR/STS/SVM Guidelines for the Diagnosis and Management of Patients with Thoracic Aortic Disease. Circulation. 2010; 121: [id number]. Little change in 1 cm left lower lobe nodule since 2015. 5. Hiatal hernia Electronically Signed   By: DLucrezia EuropeM.D.   On: 11/22/2016 10:23   Nm Pulmonary Perf And Vent  Result Date: 11/21/2016 CLINICAL DATA:  Shortness of breath for several days EXAM:  NUCLEAR MEDICINE VENTILATION - PERFUSION LUNG SCAN TECHNIQUE: Ventilation images were obtained in multiple projections using inhaled aerosol Tc-926mTPA. Perfusion images were obtained in multiple projections after intravenous injection of Tc-9912mA. RADIOPHARMACEUTICALS:  32.586 mCi Technetium-35m96mA aerosol inhalation and 3.9 mCi Technetium-35m 3mIV COMPARISON:  Radiograph 11/21/2016, CT chest 07/19/2016 FINDINGS: Ventilation: Very heterogenous ventilation with marked central clumping of radiotracer, consistent with obstructive disease. This limits  evaluation for PE. Perfusion: Single moderate wedge shaped peripheral defect in the right upper lobe. Mild diffuse heterogeneous perfusion. IMPRESSION: 1. Markedly heterogeneous ventilation with significant central clumping of radiotracer activity, consistent with obstructive lung disease. This limits assessment for PE 2. Single moderate wedge-shaped peripheral perfusion defect in the right upper lobe ; overall intermediate probability for PE. Electronically Signed   By: Donavan Foil M.D.   On: 11/21/2016 21:17   US Venous Img Lower Bilateral  Result Date: 11/22/2016 CLINICAL DATA:  History of pulmonary embolus and prior DVT EXAM: BILATERAL LOWER EXTREMITY VENOUS DOPPLER ULTRASOUND TECHNIQUE: Gray-scale sonography with graded compression, as well as color Doppler and duplex ultrasound were performed to evaluate the lower extremity deep venous systems from the level of the common femoral vein and including the common femoral, femoral, profunda femoral, popliteal and calf veins including the posterior tibial, peroneal and gastrocnemius veins when visible. The superficial great saphenous vein was also interrogated. Spectral Doppler was utilized to evaluate flow at rest and with distal augmentation maneuvers in the common femoral, femoral and popliteal veins. COMPARISON:  10/16/2013 FINDINGS: RIGHT LOWER EXTREMITY Common Femoral Vein: No evidence of thrombus.  Normal compressibility, respiratory phasicity and response to augmentation. Saphenofemoral Junction: No evidence of thrombus. Normal compressibility and flow on color Doppler imaging. Profunda Femoral Vein: No evidence of thrombus. Normal compressibility and flow on color Doppler imaging. Femoral Vein: No evidence of thrombus. Normal compressibility, respiratory phasicity and response to augmentation. Popliteal Vein: No evidence of thrombus. Normal compressibility, respiratory phasicity and response to augmentation. Calf Veins: No evidence of thrombus. Normal compressibility and flow on color Doppler imaging. Superficial Great Saphenous Vein: No evidence of thrombus. Normal compressibility and flow on color Doppler imaging. Venous Reflux:  None. Other Findings:  None. LEFT LOWER EXTREMITY Common Femoral Vein: No evidence of thrombus. Normal compressibility, respiratory phasicity and response to augmentation. Saphenofemoral Junction: No evidence of thrombus. Normal compressibility and flow on color Doppler imaging. Profunda Femoral Vein: No evidence of thrombus. Normal compressibility and flow on color Doppler imaging. Femoral Vein: No evidence of thrombus. Normal compressibility, respiratory phasicity and response to augmentation. Popliteal Vein: No evidence of thrombus. Normal compressibility, respiratory phasicity and response to augmentation. Calf Veins: No definite evidence of thrombus. Suboptimal evaluation of the left calf veins. Superficial Great Saphenous Vein: No evidence of thrombus. Normal compressibility and flow on color Doppler imaging. Venous Reflux:  None. Other Findings:  None. IMPRESSION: No evidence of deep venous thrombosis bilateral lower extremity. Electronically Signed   By: Lahoma Crocker M.D.   On: 11/22/2016 10:38   Dg Chest Port 1 View  Result Date: 11/06/2016 CLINICAL DATA:  Cough EXAM: PORTABLE CHEST 1 VIEW COMPARISON:  November 03, 2016 and July 30, 2016 FINDINGS: There is increased  airspace consolidation in the left mid lung and right base regions. Elsewhere the interstitium is prominent, likely due to mild interstitial edema superimposed on fibrotic type change. Heart is mildly enlarged with mild pulmonary venous hypertension. No adenopathy. There is aortic atherosclerosis. There is a hiatal hernia. Bones are osteoporotic with degenerative change in shoulders. IMPRESSION: Increase in airspace consolidation in the left mid lung and to a lesser extent right base regions. Suspect developing multifocal pneumonia, although alveolar edema could present in this manner. There is felt to be a degree of underlying pulmonary vascular congestion with mild interstitial edema. These changes appear essentially stable. There is aortic atherosclerosis. There is a hiatal hernia. Electronically Signed   By: Lowella Grip III M.D.  On: 11/06/2016 07:05    EKG: Personally reviewed by myself showing sinus tachycardia rate 10 4 bpm no significant ST or T-wave changes  EKG lab work, chest x-ray, previous echocardiogram personally reviewed by myself  Weights: Filed Weights   11/21/16 1502 11/22/16 0207  Weight: 187 lb (84.8 kg) 190 lb 1.6 oz (86.2 kg)     Physical Exam:  Telemetry reviewed personally by myself, showing normal sinus rhythm Blood pressure 140/60, pulse 68, temperature 98.3 F (36.8 C), temperature source Oral, resp. rate 20, height 5' 6"  (1.676 m), weight 190 lb 1.6 oz (86.2 kg), SpO2 94 %. Body mass index is 30.68 kg/m. GEN: Well nourished, well developed, in no acute distress.  On nasal cannula oxygen HEENT: Grossly normal.  Neck: Supple, no JVD, carotid bruits, or masses. Cardiac: RRR, 2/6 SEM RSB, no rubs, or gallops. No clubbing, cyanosis, trace pitting edema above the sock line bilaterally. Radials/DP/PT 2+ and equal bilaterally.  Respiratory:  Respirations regular and unlabored, coarse breath sounds likely at the bases with rales GI: Soft, nontender, nondistended, BS  + x 4. MS: no deformity or atrophy. Skin: warm and dry, no rash. Neuro:  Strength and sensation are intact. Psych: AAOx3.  Normal affect.    Assessment and Plan:   1. Acute respiratory distress Etiology unclear, CT scan with no PE She does have severe underlying lung disease/emphysema Unable to exclude bronchospasm/COPD exacerbation. She has thick cough concerning for either chronic bronchitis, unable to exclude early pneumonia. Weight has not changed significantly, less likely acute diastolic CHF. She is receiving IV fluids for CT scan with contrast.  We will  restart Lasix IV tonight in light of IV fluids. --At home she was taking prednisone, scheduled for 50 mg yesterday, 40 mg today. Suspect she will need a long slower taper given severe underlying lung disease. She has smoked most of her life.  2) history of chronic diastolic CHF Recent hospital admission with aggressive diuresis, Baseline weight at home 192 pounds. When she is ready to go home would restart torsemide 40 mg twice a day with close monitoring weight Would recommend extra doses of torsemide for any 3 pound weight gain.  3) anemia Stabilized, potentially could restart eliquis  We'll discuss with the patient what she would like to do Risk of paroxysmal arrhythmia, fibrillation and flutter  4) history of persistent atrial fibrillation, frequent PVCs We are avoiding beta blockers given her history of severe underlying lung disease She is on Cardizem CHADS2VASc at least 6 (CHF, HTN, age x 2, vascular disease, female) Consider restarting anticoagulation, eliquis   5) moderate aortic valve stenosis Echocardiogram on an annual basis  6) severe COPD Long history of smoking, on oxygen at home  She is very deconditioned. She may benefit from additional physical therapy while inpatient  High risk of readmission given severe COPD, chronic anemia, moderate aortic valve stenosis,  High risk of readmission for  diastolic CHF given the above The above was discussed with patient and husband  Total encounter time more than 110 minutes  Greater than 50% was spent in counseling and coordination of care with the patient   Signed, Esmond Plants, MD, Ph.D [facility] 11/22/2016

## 2016-11-22 NOTE — Progress Notes (Signed)
PT Hold Note  Patient Details Name: Toni [name] MRN: [id number] DOB: 11-24-1934   Cancelled Treatment:    Reason Eval/Treat Not Completed: Medical issues which prohibited therapy. Chart reviewed. Pt admitted with pulmonary embolism. Started on therapeutic heparin 11/22/16 at 0049. Per [facility] policy pt currently contraindicated for PT evaluation until 48h after initiation of therapeutic anticoagulation. PT evaluation will be performed on 11/24/16 after 0049.   Lyndel Safe Huprich PT, DPT   Huprich,Jason 11/22/2016, 8:00 AM

## 2016-11-23 ENCOUNTER — Ambulatory Visit: Payer: Medicare Other | Admitting: Family

## 2016-11-23 DIAGNOSIS — J441 Chronic obstructive pulmonary disease with (acute) exacerbation: Secondary | ICD-10-CM | POA: Diagnosis not present

## 2016-11-23 LAB — BASIC METABOLIC PANEL
ANION GAP: 10 (ref 5–15)
BUN: 59 mg/dL — AB (ref 6–20)
CALCIUM: 8.2 mg/dL — AB (ref 8.9–10.3)
CO2: 32 mmol/L (ref 22–32)
Chloride: 101 mmol/L (ref 101–111)
Creatinine, Ser: 1.56 mg/dL — ABNORMAL HIGH (ref 0.44–1.00)
GFR calc Af Amer: 35 mL/min — ABNORMAL LOW (ref >60)
GFR, EST NON AFRICAN AMERICAN: 30 mL/min — AB (ref >60)
GLUCOSE: 126 mg/dL — AB (ref 65–99)
Potassium: 3.4 mmol/L — ABNORMAL LOW (ref 3.5–5.1)
Sodium: 143 mmol/L (ref 135–145)

## 2016-11-23 LAB — CBC
HCT: 31.2 % — ABNORMAL LOW (ref 35.0–47.0)
HEMOGLOBIN: 10 g/dL — AB (ref 12.0–16.0)
MCH: 27.9 pg (ref 26.0–34.0)
MCHC: 32.1 g/dL (ref 32.0–36.0)
MCV: 86.9 fL (ref 80.0–100.0)
PLATELETS: 239 10*3/uL (ref 150–440)
RBC: 3.59 MIL/uL — ABNORMAL LOW (ref 3.80–5.20)
RDW: 17.7 % — AB (ref 11.5–14.5)
WBC: 5.8 10*3/uL (ref 3.6–11.0)

## 2016-11-23 NOTE — Discharge Summary (Signed)
[facility] NAME: Toni [name]    MR#:  [id number]  DATE OF BIRTH:  1934-10-25  DATE OF ADMISSION:  11/21/2016 ADMITTING PHYSICIAN: Harvie Bridge, DO  DATE OF DISCHARGE: 11/23/2016  PRIMARY CARE PHYSICIAN: Pcp Not In System    ADMISSION DIAGNOSIS:  Dyspnea [R06.00] Dyspnea on exertion [R06.09] Elevated troponin [R74.8] Other acute pulmonary embolism without acute cor pulmonale (HCC) [I26.99] Dyspnea, unspecified type [R06.00]  DISCHARGE DIAGNOSIS:  Principal Problem:   DYSPNEA Active Problems:   COPD exacerbation (HCC)   Chronic diastolic CHF (congestive heart failure) (HCC)   Chronic kidney disease   Aortic valve stenosis, moderate   Anemia in chronic kidney disease   PAF (paroxysmal atrial fibrillation) (HCC)   Elevated troponin   Dyspnea   SECONDARY DIAGNOSIS:   Past Medical History:  Diagnosis Date  . Atypical atrial flutter ([place]) 11/16/2013   a. CHA2DS2VASc = 5-->prev on xarelto, d/c'd due to bleeding skin tear;  c. 11/2013->Placed on flecainide.  . Cataract   . Chronic diastolic CHF (congestive heart failure) ([place])    a. 11/2013 Echo: EF 55-60%, mild AS/MR, mod dil LA/RA, PASP 43mHg.  .Marland KitchenChronic diastolic heart failure ([place]   . Collagen vascular disease ([facility]   . COPD (chronic obstructive pulmonary disease) ([facility]   . Essential hypertension, benign   . Hypokalemia   . Hypothyroidism   . 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 Lung cancer ([facility]   . Lung nodule    Followed by Dr. FRaul Del . PAF (paroxysmal atrial fibrillation) ([facility]    a. 11/2013 Echo: EF 55-60%, mild AS/MR, mod dil LA/RA, PASP 318mg;  b. CHA2DS2VASc = 5-->prev on xarelto, d/c'd due to bleeding skin tear;  c. 11/2013->Placed on flecainide.    HOSPITAL COURSE:  [id number] old female with history of PAF, chronic diastolic heart failure and COPD on nocturnal oxygen who was recently discharged from the hospital with  COPD/pneumonia and CHF exacerbation who presented shortness of breath.  1. Acute dyspnea: Initially thought to have PE with VQ scan however Chest CT scan shows no evidence PE. Dopplers are negative for DVT. Acute dyspnea as due to COPD exacerbation. She was started on IV steroids. She will continue with the prednisone taper that she was recently discharged with. Her shortness of breath/dyspnea has improved. Chest x-ray did not show evidence of pneumonia it did show resolved pneumonia from previous hospital stay.   2. Chronic diastolic heart failure without exacerbation: She was initiated on IV Lasix however at discharge will continue oral torsemide. She was evaluated by cardiology while in the hospital 3. Hypothyroid: Continue Synthroid  4. 4.2 cm ascending aortic aneurysm. Recommend annual imaging followup by CTA or MRA. We will schedule outpatient vascular surgery follow-up. 5. PAF: Due to anemia, anticoagulation was discontinued during last hospital stay. Her hemoglobin does remain stable and perhaps as an outpatient anticoagulation should be restarted. She will have follow-up in 3-4 days with cardiology and if hemoglobin continues to remain stable then she will restart anticoagulation with close monitoring of CBC. Continue diltiazem for heart rate control   DISCHARGE CONDITIONS AND DIET:   Stable for discharge on heart healthy diet  CONSULTS OBTAINED:  Treatment Team:  TiMinna MerrittsMD  DRUG ALLERGIES:   Allergies  Allergen Reactions  . Belladonna Alkaloids Anaphylaxis and Other (See Comments)    Pt states that this medication makes her eyes cross.    . Dabigatran Other (See  Comments)    DISCHARGE MEDICATIONS:   Current Discharge Medication List    CONTINUE these medications which have NOT CHANGED   Details  albuterol (PROVENTIL HFA) 108 (90 Base) MCG/ACT inhaler Inhale 2 puffs into the lungs every 4 (four) hours as needed for wheezing or shortness of breath. Qty: 1  Inhaler, Refills: 0    ALPRAZolam (XANAX) 0.25 MG tablet Take 1 tablet (0.25 mg total) by mouth at bedtime as needed for anxiety. Qty: 30 tablet, Refills: 0    budesonide (PULMICORT) 0.5 MG/2ML nebulizer solution Take 2 mLs (0.5 mg total) by nebulization 2 (two) times daily. Qty: 120 mL, Refills: 11   Associated Diagnoses: Centrilobular emphysema (HCC)    cyanocobalamin (,VITAMIN B-12,) 1000 MCG/ML injection Vit B12 1000 mcg IM once a week for 4 weeks then once a month for 4 months   Associated Diagnoses: Squamous cell lung cancer, right (HCC)    diltiazem (CARDIZEM CD) 300 MG 24 hr capsule Take 300 mg by mouth daily.    formoterol (PERFOROMIST) 20 MCG/2ML nebulizer solution Take 2 mLs (20 mcg total) by nebulization 2 (two) times daily. Qty: 2 mL, Refills: 12   Associated Diagnoses: Centrilobular emphysema ([place])    ipratropium-albuterol (DUONEB) 0.5-2.5 (3) MG/3ML SOLN Take 3 mLs by nebulization every 4 (four) hours as needed (for wheezing/shortness of breath). DX Code: J44.9   DX: COPD Qty: 360 mL, Refills: 3    levothyroxine (SYNTHROID, LEVOTHROID) 75 MCG tablet Take 75 mcg by mouth daily before breakfast.    nystatin (MYCOSTATIN) 100000 UNIT/ML suspension Take 5 mLs (500,000 Units total) by mouth 4 (four) times daily. Qty: 60 mL, Refills: 0    omega-3 acid ethyl esters (LOVAZA) 1 g capsule Take 1 g by mouth daily.    omeprazole (PRILOSEC) 20 MG capsule Take 20 mg by mouth 2 (two) times daily before a meal.    potassium chloride (K-DUR,KLOR-CON) 10 MEQ tablet Take 2 tablets (20 mEq total) by mouth 2 (two) times daily. Qty: 120 tablet, Refills: 6    predniSONE (STERAPRED UNI-PAK 21 TAB) 10 MG (21) TBPK tablet Taper by 10 mg daily Qty: 21 tablet, Refills: 0    torsemide (DEMADEX) 20 MG tablet Take 2 tablets (40 mg total) by mouth 2 (two) times daily. Qty: 60 tablet, Refills: 0    loratadine (CLARITIN) 10 MG tablet Take 10 mg by mouth daily.          Today   CHIEF  COMPLAINT:  Patient is ready to be discharged today no acute events overnight   VITAL SIGNS:  Blood pressure (!) 139/54, pulse 65, temperature 97.9 F (36.6 C), temperature source Oral, resp. rate 17, height 5' 6"  (1.676 m), weight 86.9 kg (191 lb 9.6 oz), SpO2 100 %.   REVIEW OF SYSTEMS:  Review of Systems  Constitutional: Negative.  Negative for chills, fever and malaise/fatigue.  HENT: Negative.  Negative for ear discharge, ear pain, hearing loss, nosebleeds and sore throat.   Eyes: Negative.  Negative for blurred vision and pain.  Respiratory: Negative.  Negative for cough, hemoptysis, shortness of breath and wheezing.   Cardiovascular: Negative.  Negative for chest pain, palpitations and leg swelling.  Gastrointestinal: Negative.  Negative for abdominal pain, blood in stool, diarrhea, nausea and vomiting.  Genitourinary: Negative.  Negative for dysuria.  Musculoskeletal: Negative.  Negative for back pain.  Skin: Negative.   Neurological: Negative for dizziness, tremors, speech change, focal weakness, seizures and headaches.  Endo/Heme/Allergies: Negative.  Does not bruise/bleed easily.  Psychiatric/Behavioral: Negative.  Negative for depression, hallucinations and suicidal ideas.     PHYSICAL EXAMINATION:  GENERAL:  82 y.o.-year-old patient lying in the bed with no acute distress.  NECK:  Supple, no jugular venous distention. No thyroid enlargement, no tenderness.  LUNGS: Normal breath sounds bilaterally, no wheezing, rales,rhonchi  No use of accessory muscles of respiration.  CARDIOVASCULAR: S1, S2 normal. 3/6SEM NO rubs, or gallops.  ABDOMEN: Soft, non-tender, non-distended. Bowel sounds present. No organomegaly or mass.  EXTREMITIES: No pedal edema, cyanosis, or clubbing.  PSYCHIATRIC: The patient is alert and oriented x 3.  SKIN: No obvious rash, lesion, or ulcer.   DATA REVIEW:   CBC  Recent Labs Lab 11/23/16 0637  WBC 5.8  HGB 10.0*  HCT 31.2*  PLT 239     Chemistries   Recent Labs Lab 11/21/16 1505  11/23/16 0637  NA 140  < > 143  K 4.1  < > 3.4*  CL 98*  < > 101  CO2 31  < > 32  GLUCOSE 118*  < > 126*  BUN 54*  < > 59*  CREATININE 1.73*  < > 1.56*  CALCIUM 8.3*  < > 8.2*  AST 27  --   --   ALT 16  --   --   ALKPHOS 62  --   --   BILITOT 0.6  --   --   < > = values in this interval not displayed.  Cardiac Enzymes  Recent Labs Lab 11/22/16 0321 11/22/16 0827 11/22/16 1427  TROPONINI 0.08* 0.08* 0.08*    Microbiology Results  @[email or web address] @  RADIOLOGY:  Dg Chest 2 View  Result Date: 11/21/2016 CLINICAL DATA:  Shortness of Breath EXAM: CHEST  2 VIEW COMPARISON:  11/12/2016 FINDINGS: Cardiac shadow is mildly prominent but stable. A hiatal hernia is again seen. The lungs are well aerated bilaterally. No focal infiltrate or sizable effusion is seen. Mild compression deformities of a chronic nature are noted in the thoracic spine. Previously seen left upper lobe infiltrate has resolved in the interval. IMPRESSION: No active cardiopulmonary disease. Electronically Signed   By: Inez Catalina M.D.   On: 11/21/2016 15:43   Ct Angio Chest Pe W Or Wo Contrast  Result Date: 11/22/2016 CLINICAL DATA:  known history of chronic diastolic CHF, COPD, hypertension, hypothyroidism, CKD stage II, paroxysmal atrial fib/a.flutter presents to the emergency department for evaluation of shortness of breath. EXAM: CT ANGIOGRAPHY CHEST WITH CONTRAST TECHNIQUE: Multidetector CT imaging of the chest was performed using the standard protocol during bolus administration of intravenous contrast. Multiplanar CT image reconstructions and MIPs were obtained to evaluate the vascular anatomy. CONTRAST:  Pt given 39m 370 isovue due to health. COMPARISON:  07/30/2016 FINDINGS: Cardiovascular: Left arm IV contrast administration. Innominate vein and SVC patent. Mild right atrial enlargement. Right ventricle is nondilated. Satisfactory opacification of pulmonary  arteries noted, and there is no evidence of pulmonary emboli. Patent bilateral pulmonary veins drain into the left atrium. Left atrial enlargement. Mitral annulus calcifications. Scattered coronary calcifications. Aortic valve calcifications. 4.2 cm ascending aortic aneurysm. Adequate contrast opacification of the thoracic aorta with no evidence of dissection, or stenosis. There is classic 3-vessel brachiocephalic arch anatomy without proximal stenosis. Scattered calcified plaque through the aorta. The visualized proximal abdominal aorta is unremarkable. Mediastinum/Nodes: No enlarged mediastinal, hilar, or axillary lymph nodes. Thyroid gland, trachea, and esophagus demonstrate no significant findings. Moderate hiatal hernia. Lungs/Pleura: No pleural effusion. No pneumothorax. 1 cm noncalcified nodule, superior segment left lower lobe, previously  9 mm on 04/06/2014. Poorly marginated somewhat geographic ground-glass opacities in both upper lobes, more conspicuous than on prior studies. No new nodule. Upper Abdomen: Hiatal hernia.  No acute findings. Musculoskeletal: Mild compression deformities at T5, T7, and T8, present on prior study. Spondylitic changes in the lower thoracic and visualized upper lumbar spine. No new fracture or worrisome bone lesion. Review of the MIP images confirms the above findings. IMPRESSION: 1. Negative for acute PE or thoracic aortic dissection. 2. Coronary and aortic calcifications. 3. 4.2 cm ascending aortic aneurysm. Recommend annual imaging followup by CTA or MRA. This recommendation follows 2010 ACCF/AHA/AATS/ACR/ASA/SCA/SCAI/SIR/STS/SVM Guidelines for the Diagnosis and Management of Patients with Thoracic Aortic Disease. Circulation. 2010; 121: T024-O973 4. Little change in 1 cm left lower lobe nodule since 2015. 5. Hiatal hernia Electronically Signed   By: Lucrezia Europe M.D.   On: 11/22/2016 10:23   Nm Pulmonary Perf And Vent  Result Date: 11/21/2016 CLINICAL DATA:  Shortness of  breath for several days EXAM: NUCLEAR MEDICINE VENTILATION - PERFUSION LUNG SCAN TECHNIQUE: Ventilation images were obtained in multiple projections using inhaled aerosol Tc-73mDTPA. Perfusion images were obtained in multiple projections after intravenous injection of Tc-937mAA. RADIOPHARMACEUTICALS:  32.586 mCi Technetium-9935mPA aerosol inhalation and 3.9 mCi Technetium-53m72m IV COMPARISON:  Radiograph 11/21/2016, CT chest 07/19/2016 FINDINGS: Ventilation: Very heterogenous ventilation with marked central clumping of radiotracer, consistent with obstructive disease. This limits evaluation for PE. Perfusion: Single moderate wedge shaped peripheral defect in the right upper lobe. Mild diffuse heterogeneous perfusion. IMPRESSION: 1. Markedly heterogeneous ventilation with significant central clumping of radiotracer activity, consistent with obstructive lung disease. This limits assessment for PE 2. Single moderate wedge-shaped peripheral perfusion defect in the right upper lobe ; overall intermediate probability for PE. Electronically Signed   By: Kim Donavan Foil.   On: 11/21/2016 21:17   Us VKoreaous Img Lower Bilateral  Result Date: 11/22/2016 CLINICAL DATA:  History of pulmonary embolus and prior DVT EXAM: BILATERAL LOWER EXTREMITY VENOUS DOPPLER ULTRASOUND TECHNIQUE: Gray-scale sonography with graded compression, as well as color Doppler and duplex ultrasound were performed to evaluate the lower extremity deep venous systems from the level of the common femoral vein and including the common femoral, femoral, profunda femoral, popliteal and calf veins including the posterior tibial, peroneal and gastrocnemius veins when visible. The superficial great saphenous vein was also interrogated. Spectral Doppler was utilized to evaluate flow at rest and with distal augmentation maneuvers in the common femoral, femoral and popliteal veins. COMPARISON:  10/16/2013 FINDINGS: RIGHT LOWER EXTREMITY Common Femoral Vein:  No evidence of thrombus. Normal compressibility, respiratory phasicity and response to augmentation. Saphenofemoral Junction: No evidence of thrombus. Normal compressibility and flow on color Doppler imaging. Profunda Femoral Vein: No evidence of thrombus. Normal compressibility and flow on color Doppler imaging. Femoral Vein: No evidence of thrombus. Normal compressibility, respiratory phasicity and response to augmentation. Popliteal Vein: No evidence of thrombus. Normal compressibility, respiratory phasicity and response to augmentation. Calf Veins: No evidence of thrombus. Normal compressibility and flow on color Doppler imaging. Superficial Great Saphenous Vein: No evidence of thrombus. Normal compressibility and flow on color Doppler imaging. Venous Reflux:  None. Other Findings:  None. LEFT LOWER EXTREMITY Common Femoral Vein: No evidence of thrombus. Normal compressibility, respiratory phasicity and response to augmentation. Saphenofemoral Junction: No evidence of thrombus. Normal compressibility and flow on color Doppler imaging. Profunda Femoral Vein: No evidence of thrombus. Normal compressibility and flow on color Doppler imaging. Femoral Vein: No evidence of thrombus. Normal  compressibility, respiratory phasicity and response to augmentation. Popliteal Vein: No evidence of thrombus. Normal compressibility, respiratory phasicity and response to augmentation. Calf Veins: No definite evidence of thrombus. Suboptimal evaluation of the left calf veins. Superficial Great Saphenous Vein: No evidence of thrombus. Normal compressibility and flow on color Doppler imaging. Venous Reflux:  None. Other Findings:  None. IMPRESSION: No evidence of deep venous thrombosis bilateral lower extremity. Electronically Signed   By: Lahoma Crocker M.D.   On: 11/22/2016 10:38      Current Discharge Medication List    CONTINUE these medications which have NOT CHANGED   Details  albuterol (PROVENTIL HFA) 108 (90 Base)  MCG/ACT inhaler Inhale 2 puffs into the lungs every 4 (four) hours as needed for wheezing or shortness of breath. Qty: 1 Inhaler, Refills: 0    ALPRAZolam (XANAX) 0.25 MG tablet Take 1 tablet (0.25 mg total) by mouth at bedtime as needed for anxiety. Qty: 30 tablet, Refills: 0    budesonide (PULMICORT) 0.5 MG/2ML nebulizer solution Take 2 mLs (0.5 mg total) by nebulization 2 (two) times daily. Qty: 120 mL, Refills: 11   Associated Diagnoses: Centrilobular emphysema (HCC)    cyanocobalamin (,VITAMIN B-12,) 1000 MCG/ML injection Vit B12 1000 mcg IM once a week for 4 weeks then once a month for 4 months   Associated Diagnoses: Squamous cell lung cancer, right (HCC)    diltiazem (CARDIZEM CD) 300 MG 24 hr capsule Take 300 mg by mouth daily.    formoterol (PERFOROMIST) 20 MCG/2ML nebulizer solution Take 2 mLs (20 mcg total) by nebulization 2 (two) times daily. Qty: 2 mL, Refills: 12   Associated Diagnoses: Centrilobular emphysema ([place])    ipratropium-albuterol (DUONEB) 0.5-2.5 (3) MG/3ML SOLN Take 3 mLs by nebulization every 4 (four) hours as needed (for wheezing/shortness of breath). DX Code: J44.9   DX: COPD Qty: 360 mL, Refills: 3    levothyroxine (SYNTHROID, LEVOTHROID) 75 MCG tablet Take 75 mcg by mouth daily before breakfast.    nystatin (MYCOSTATIN) 100000 UNIT/ML suspension Take 5 mLs (500,000 Units total) by mouth 4 (four) times daily. Qty: 60 mL, Refills: 0    omega-3 acid ethyl esters (LOVAZA) 1 g capsule Take 1 g by mouth daily.    omeprazole (PRILOSEC) 20 MG capsule Take 20 mg by mouth 2 (two) times daily before a meal.    potassium chloride (K-DUR,KLOR-CON) 10 MEQ tablet Take 2 tablets (20 mEq total) by mouth 2 (two) times daily. Qty: 120 tablet, Refills: 6    predniSONE (STERAPRED UNI-PAK 21 TAB) 10 MG (21) TBPK tablet Taper by 10 mg daily Qty: 21 tablet, Refills: 0    torsemide (DEMADEX) 20 MG tablet Take 2 tablets (40 mg total) by mouth 2 (two) times daily. Qty: 60  tablet, Refills: 0    loratadine (CLARITIN) 10 MG tablet Take 10 mg by mouth daily.          Management plans discussed with the patient and she is in agreement. Stable for discharge home with [facility]  Patient should follow up with cardiology to restart Eliquis  CODE STATUS:     Code Status Orders        Start     Ordered   11/22/16 0208  Full code  Continuous     11/22/16 0207    Code Status History    Date Active Date Inactive Code Status Order ID Comments User Context   11/15/2016 12:21 PM 11/19/2016  6:24 PM DNR [id number]  Earlie Counts, NP Inpatient  11/03/2016  8:39 PM 11/15/2016 12:21 PM Full Code [id number]  Gladstone Lighter, MD Inpatient   02/16/2016  9:27 PM 02/26/2016  2:33 PM Full Code [id number]  Lance Coon, MD Inpatient   11/16/2013 12:33 AM 11/24/2013  6:53 PM Full Code [id number]  Angelica Ran, MD Inpatient    Advance Directive Documentation   Flowsheet Row Most Recent Value  Type of Advance Directive  Healthcare Power of Attorney, Living will  Pre-existing out of facility DNR order (yellow form or pink MOST form)  No data  "MOST" Form in Place?  No data      TOTAL TIME TAKING CARE OF THIS PATIENT: 36 minutes.    Note: This dictation was prepared with Dragon dictation along with smaller phrase technology. Any transcriptional errors that result from this process are unintentional.  Romone Shaff M.D on 11/23/2016 at 7:54 AM  Between 7am to 6pm - Pager - [phone] After 6pm go to [email or web address] - password [email or web address] [facility]  Office  [phone]  CC: Primary care physician; Pcp Not In System

## 2016-11-23 NOTE — Evaluation (Signed)
Physical Therapy Evaluation Patient Details Name: Toni [name] MRN: [id number] DOB: [birth year] Today's Date: 11/23/2016   History of Present Illness  Toni [name] is a 82 y.o. female with a known history of chronic diastolic CHF, COPD, hypertension, hypothyroidism, CKD stage II, paroxysmal atrial fib/a.flutter presents to the emergency department for evaluation of shortness of breath.  Patient was in a usual state of health until this afternoon when she noticed gradually worsening shortness of breath requiring more than her usual 2 L of home O2. Symptoms are worse with exertion. Of note, patient was recently hospitalized for congestive heart failure. Pt was initially believed to have a PE however CT eventually ruled this out. She reports improvement upon PT arrival.   Clinical Impression  Pt admitted with above diagnosis. Pt currently with functional limitations due to the deficits listed below (see PT Problem List). Pt is able to ambulate to RN station and back to recliner. On room air at rest SaO2 is 93%. She remains at or above 91% on room air during ambulation. However pt reports progressively worsening SOB. She reports 7/10 DOE on BORG scale and it becomes increasingly difficult for her to carry on a conversation while walking. Pt in afib and HR ranges from 80-120's during ambulation. After seated rest break pt reports improvement in symptoms. Step length is decreased and overall gait speed is decreased. Fair stability without UE support. Encouraged use of rollator as needed particularly for energy conservation due to ability to sit down as needed. Pt is safe to return home with [facility]. In the future she would benefit from pulmonary rehab to improve pulmonary endurance and prevent readmittance. Pt will benefit from skilled PT services to address deficits in strength, balance, and mobility in order to return to full function at home.       Follow Up Recommendations Home health PT;Other (comment)  (Would benefit from pulmonary rehab)    Equipment Recommendations  None recommended by PT    Recommendations for Other Services       Precautions / Restrictions Precautions Precautions: Fall Restrictions Weight Bearing Restrictions: No      Mobility  Bed Mobility               General bed mobility comments: Received upright in recliner and left in recliner. Bed mobility not assessed on this date  Transfers Overall transfer level: Modified independent Equipment used: None Transfers: Sit to/from Stand Sit to Stand: Modified independent (Device/Increase time)         General transfer comment: Pt demonstrates fair speed/sequencing. Once upright she is relatively stable but does reach for counter to provide additional stability  Ambulation/Gait Ambulation/Gait assistance: Supervision Ambulation Distance (Feet): 100 Feet Assistive device: None Gait Pattern/deviations: Step-through pattern Gait velocity: Decreased Gait velocity interpretation: <1.8 ft/sec, indicative of risk for recurrent falls General Gait Details: Pt is able to ambulate to RN station and back to recliner. On room air at rest SaO2 is 93%. She remains at or above 91% on room air during ambulation. However pt reports progressively worsening SOB. She reports 7/10 DOE on BORG scale and it becomes increasingly difficult for her to carry on a conversation while walking. Pt in afib and HR ranges from 80-120's during ambulation. After seated rest break pt reports improvement in symptoms. Step length is decreased and overall gait speed is decreased. Fair stability without UE support. Encouraged use of rollator as needed particularly for energy conservation due to ability to sit down as needed.  Stairs            Wheelchair Mobility    Modified Rankin (Stroke Patients Only)       Balance Overall balance assessment: Needs assistance Sitting-balance support: No upper extremity supported;Feet  supported Sitting balance-Leahy Scale: Good     Standing balance support: Bilateral upper extremity supported Standing balance-Leahy Scale: Fair                               Pertinent Vitals/Pain Pain Assessment: No/denies pain    Home Living Family/patient expects to be discharged to:: Private residence Living Arrangements: Spouse/significant other Available Help at Discharge: Family Type of Home: House Home Access: Stairs to enter Entrance Stairs-Rails: Right Entrance Stairs-Number of Steps: 2 Home Layout: One level;Laundry or work area in [place]: Shower seat;Bedside commode;Grab bars - tub/shower;Walker - 4 wheels (Lift chair)      Prior Function Level of Independence: Independent         Comments: Indep with ADLs, household and community mobility without assistive device; 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  Denies fall history; home O2 at night at 2L/min     Hand Dominance   Dominant Hand: Right    Extremity/Trunk Assessment   Upper Extremity Assessment Upper Extremity Assessment: Generalized weakness    Lower Extremity Assessment Lower Extremity Assessment: Generalized weakness       Communication   Communication: HOH  Cognition Arousal/Alertness: Awake/alert Behavior During Therapy: WFL for tasks assessed/performed Overall Cognitive Status: Within Functional Limits for tasks assessed                      General Comments      Exercises     Assessment/Plan    PT Assessment Patient needs continued PT services  PT Problem List Decreased activity tolerance;Decreased balance;Decreased mobility;Cardiopulmonary status limiting activity;Decreased strength       PT Treatment Interventions DME instruction;Gait training;Stair training;Functional mobility training;Therapeutic activities;Therapeutic exercise;Balance training;Patient/family education;Neuromuscular re-education    PT Goals (Current goals can be  found in the Care Plan section)  Acute Rehab PT Goals Patient Stated Goal: to return home and have therapy there PT Goal Formulation: With patient Time For Goal Achievement: 11/18/16 Potential to Achieve Goals: Good    Frequency Min 2X/week   Barriers to discharge        Co-evaluation               End of Session Equipment Utilized During Treatment: Gait belt;Oxygen Activity Tolerance: Patient limited by fatigue Patient left: in chair;with call bell/phone within reach;with chair alarm set Nurse Communication: Mobility status;Other (comment) (DOE) PT Visit Diagnosis: Unsteadiness on feet (R26.81);Muscle weakness (generalized) (M62.81)    Functional Assessment Tool Used: AM-PAC 6 Clicks Basic Mobility;Clinical judgement Functional Limitation: Mobility: Walking and moving around Mobility: Walking and Moving Around Current Status (T5176): At least 20 percent but less than 40 percent impaired, limited or restricted Mobility: Walking and Moving Around Goal Status [phone]): At least 1 percent but less than 20 percent impaired, limited or restricted    Time: 0922-0940 PT Time Calculation (min) (ACUTE ONLY): 18 min   Charges:   PT Evaluation $PT Eval Low Complexity: 1 Procedure     PT G Codes:   PT G-Codes **NOT FOR INPATIENT CLASS** Functional Assessment Tool Used: AM-PAC 6 Clicks Basic Mobility;Clinical judgement Functional Limitation: Mobility: Walking and moving around Mobility: Walking and Moving Around Current Status (X1062): At  least 20 percent but less than 40 percent impaired, limited or restricted Mobility: Walking and Moving Around Goal Status [phone]): At least 1 percent but less than 20 percent impaired, limited or restricted    Phillips Grout PT, DPT   Huprich,Jason 11/23/2016, 12:30 PM

## 2016-11-23 NOTE — Progress Notes (Signed)
Patient is being discharge home as per order, discharge instruction provided IV removed all discharge instruction reviewed with patient and family, patient discharge home

## 2016-11-23 NOTE — Care Management (Signed)
Discharge to home today per Dr. Benjie Karvonen. Home Health orders faxed to [facility], Husband will transport. Shelbie Ammons RN MSN CCM Care Management
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
## 2016-11-28 ENCOUNTER — Encounter: Payer: Medicare Other | Admitting: Cardiovascular Disease

## 2016-11-28 ENCOUNTER — Telehealth: Payer: Self-pay | Admitting: Cardiovascular Disease

## 2016-11-28 NOTE — Telephone Encounter (Signed)
[place] has resumed services on pt, and wanted to let us know.

## 2016-11-29 ENCOUNTER — Emergency Department: Payer: Medicare Other

## 2016-11-29 ENCOUNTER — Encounter: Payer: Self-pay | Admitting: Emergency Medicine

## 2016-11-29 ENCOUNTER — Inpatient Hospital Stay
Admission: EM | Admit: 2016-11-29 | Discharge: 2016-12-08 | DRG: 193 | Disposition: A | Discharge status: Hospice | Payer: Medicare Other | Attending: Internal Medicine | Admitting: Internal Medicine

## 2016-11-29 DIAGNOSIS — J159 Unspecified bacterial pneumonia: Secondary | ICD-10-CM | POA: Diagnosis present

## 2016-11-29 DIAGNOSIS — C3411 Malignant neoplasm of upper lobe, right bronchus or lung: Secondary | ICD-10-CM | POA: Diagnosis present

## 2016-11-29 DIAGNOSIS — I350 Nonrheumatic aortic (valve) stenosis: Secondary | ICD-10-CM | POA: Diagnosis present

## 2016-11-29 DIAGNOSIS — R0603 Acute respiratory distress: Secondary | ICD-10-CM

## 2016-11-29 DIAGNOSIS — R0600 Dyspnea, unspecified: Secondary | ICD-10-CM | POA: Diagnosis not present

## 2016-11-29 DIAGNOSIS — I5033 Acute on chronic diastolic (congestive) heart failure: Secondary | ICD-10-CM | POA: Diagnosis present

## 2016-11-29 DIAGNOSIS — Z66 Do not resuscitate: Secondary | ICD-10-CM | POA: Diagnosis not present

## 2016-11-29 DIAGNOSIS — Z515 Encounter for palliative care: Secondary | ICD-10-CM | POA: Diagnosis not present

## 2016-11-29 DIAGNOSIS — K219 Gastro-esophageal reflux disease without esophagitis: Secondary | ICD-10-CM | POA: Diagnosis present

## 2016-11-29 DIAGNOSIS — R748 Abnormal levels of other serum enzymes: Secondary | ICD-10-CM | POA: Diagnosis present

## 2016-11-29 DIAGNOSIS — R000 Tachycardia, unspecified: Secondary | ICD-10-CM | POA: Diagnosis present

## 2016-11-29 DIAGNOSIS — J1001 Influenza due to other identified influenza virus with the same other identified influenza virus pneumonia: Secondary | ICD-10-CM | POA: Diagnosis present

## 2016-11-29 DIAGNOSIS — Z7189 Other specified counseling: Secondary | ICD-10-CM

## 2016-11-29 DIAGNOSIS — I130 Hypertensive heart and chronic kidney disease with heart failure and stage 1 through stage 4 chronic kidney disease, or unspecified chronic kidney disease: Secondary | ICD-10-CM | POA: Diagnosis present

## 2016-11-29 DIAGNOSIS — R911 Solitary pulmonary nodule: Secondary | ICD-10-CM | POA: Diagnosis present

## 2016-11-29 DIAGNOSIS — F0280 Dementia in other diseases classified elsewhere without behavioral disturbance: Secondary | ICD-10-CM | POA: Diagnosis present

## 2016-11-29 DIAGNOSIS — R0609 Other forms of dyspnea: Secondary | ICD-10-CM | POA: Diagnosis not present

## 2016-11-29 DIAGNOSIS — Z87891 Personal history of nicotine dependence: Secondary | ICD-10-CM

## 2016-11-29 DIAGNOSIS — D631 Anemia in chronic kidney disease: Secondary | ICD-10-CM | POA: Diagnosis present

## 2016-11-29 DIAGNOSIS — Z96653 Presence of artificial knee joint, bilateral: Secondary | ICD-10-CM | POA: Diagnosis present

## 2016-11-29 DIAGNOSIS — J9621 Acute and chronic respiratory failure with hypoxia: Secondary | ICD-10-CM | POA: Diagnosis present

## 2016-11-29 DIAGNOSIS — G309 Alzheimer's disease, unspecified: Secondary | ICD-10-CM | POA: Diagnosis present

## 2016-11-29 DIAGNOSIS — I998 Other disorder of circulatory system: Secondary | ICD-10-CM | POA: Diagnosis present

## 2016-11-29 DIAGNOSIS — N182 Chronic kidney disease, stage 2 (mild): Secondary | ICD-10-CM | POA: Diagnosis present

## 2016-11-29 DIAGNOSIS — Z7951 Long term (current) use of inhaled steroids: Secondary | ICD-10-CM

## 2016-11-29 DIAGNOSIS — Z8249 Family history of ischemic heart disease and other diseases of the circulatory system: Secondary | ICD-10-CM

## 2016-11-29 DIAGNOSIS — J440 Chronic obstructive pulmonary disease with acute lower respiratory infection: Secondary | ICD-10-CM | POA: Diagnosis present

## 2016-11-29 DIAGNOSIS — F4322 Adjustment disorder with anxiety: Secondary | ICD-10-CM | POA: Diagnosis present

## 2016-11-29 DIAGNOSIS — J441 Chronic obstructive pulmonary disease with (acute) exacerbation: Secondary | ICD-10-CM | POA: Diagnosis present

## 2016-11-29 DIAGNOSIS — R7989 Other specified abnormal findings of blood chemistry: Secondary | ICD-10-CM

## 2016-11-29 DIAGNOSIS — Z9981 Dependence on supplemental oxygen: Secondary | ICD-10-CM | POA: Diagnosis not present

## 2016-11-29 DIAGNOSIS — R778 Other specified abnormalities of plasma proteins: Secondary | ICD-10-CM

## 2016-11-29 DIAGNOSIS — Z79899 Other long term (current) drug therapy: Secondary | ICD-10-CM

## 2016-11-29 DIAGNOSIS — E039 Hypothyroidism, unspecified: Secondary | ICD-10-CM | POA: Diagnosis present

## 2016-11-29 DIAGNOSIS — I480 Paroxysmal atrial fibrillation: Secondary | ICD-10-CM | POA: Diagnosis present

## 2016-11-29 DIAGNOSIS — I4891 Unspecified atrial fibrillation: Secondary | ICD-10-CM

## 2016-11-29 DIAGNOSIS — Z888 Allergy status to other drugs, medicaments and biological substances status: Secondary | ICD-10-CM

## 2016-11-29 DIAGNOSIS — R0902 Hypoxemia: Secondary | ICD-10-CM

## 2016-11-29 LAB — CBC WITH DIFFERENTIAL/PLATELET
BASOS ABS: 0 10*3/uL (ref 0–0.1)
Basophils Relative: 0 %
Eosinophils Absolute: 0.1 10*3/uL (ref 0–0.7)
Eosinophils Relative: 1 %
HEMATOCRIT: 33.7 % — AB (ref 35.0–47.0)
HEMOGLOBIN: 11.1 g/dL — AB (ref 12.0–16.0)
LYMPHS PCT: 6 %
Lymphs Abs: 0.5 10*3/uL — ABNORMAL LOW (ref 1.0–3.6)
MCH: 28.2 pg (ref 26.0–34.0)
MCHC: 32.9 g/dL (ref 32.0–36.0)
MCV: 85.8 fL (ref 80.0–100.0)
Monocytes Absolute: 0.3 10*3/uL (ref 0.2–0.9)
Monocytes Relative: 4 %
NEUTROS ABS: 7.4 10*3/uL — AB (ref 1.4–6.5)
NEUTROS PCT: 89 %
PLATELETS: 166 10*3/uL (ref 150–440)
RBC: 3.93 MIL/uL (ref 3.80–5.20)
RDW: 17.3 % — ABNORMAL HIGH (ref 11.5–14.5)
WBC: 8.3 10*3/uL (ref 3.6–11.0)

## 2016-11-29 LAB — INFLUENZA PANEL BY PCR (TYPE A & B)
INFLBPCR: POSITIVE — AB
Influenza A By PCR: NEGATIVE

## 2016-11-29 LAB — COMPREHENSIVE METABOLIC PANEL
ALT: 17 U/L (ref 14–54)
AST: 27 U/L (ref 15–41)
Albumin: 3.9 g/dL (ref 3.5–5.0)
Alkaline Phosphatase: 65 U/L (ref 38–126)
Anion gap: 13 (ref 5–15)
BILIRUBIN TOTAL: 1.1 mg/dL (ref 0.3–1.2)
BUN: 45 mg/dL — AB (ref 6–20)
CHLORIDE: 95 mmol/L — AB (ref 101–111)
CO2: 33 mmol/L — ABNORMAL HIGH (ref 22–32)
Calcium: 8.6 mg/dL — ABNORMAL LOW (ref 8.9–10.3)
Creatinine, Ser: 1.15 mg/dL — ABNORMAL HIGH (ref 0.44–1.00)
GFR calc Af Amer: 51 mL/min — ABNORMAL LOW (ref >60)
GFR, EST NON AFRICAN AMERICAN: 44 mL/min — AB (ref >60)
Glucose, Bld: 117 mg/dL — ABNORMAL HIGH (ref 65–99)
Potassium: 4.1 mmol/L (ref 3.5–5.1)
Sodium: 141 mmol/L (ref 135–145)
TOTAL PROTEIN: 7.1 g/dL (ref 6.5–8.1)

## 2016-11-29 LAB — TROPONIN I: TROPONIN I: 0.05 ng/mL — AB (ref <0.03)

## 2016-11-29 LAB — BRAIN NATRIURETIC PEPTIDE: B Natriuretic Peptide: 172 pg/mL — ABNORMAL HIGH (ref 0.0–100.0)

## 2016-11-29 LAB — MAGNESIUM: MAGNESIUM: 2 mg/dL (ref 1.7–2.4)

## 2016-11-29 MED ORDER — ORAL CARE MOUTH RINSE
15.0000 mL | Freq: Two times a day (BID) | OROMUCOSAL | Status: DC
  Administered 2016-12-01 – 2016-12-07 (×9): 15 mL via OROMUCOSAL

## 2016-11-29 MED ORDER — ALPRAZOLAM 0.25 MG PO TABS
0.2500 mg | ORAL_TABLET | Freq: Every evening | ORAL | Status: DC | PRN
  Administered 2016-11-29 – 2016-12-01 (×3): 0.25 mg via ORAL
  Filled 2016-11-29 (×4): qty 1

## 2016-11-29 MED ORDER — IPRATROPIUM-ALBUTEROL 0.5-2.5 (3) MG/3ML IN SOLN
3.0000 mL | Freq: Four times a day (QID) | RESPIRATORY_TRACT | Status: DC
  Administered 2016-11-29 – 2016-12-04 (×18): 3 mL via RESPIRATORY_TRACT
  Filled 2016-11-29 (×18): qty 3

## 2016-11-29 MED ORDER — NYSTATIN 100000 UNIT/ML MT SUSP
5.0000 mL | Freq: Four times a day (QID) | OROMUCOSAL | Status: DC
  Administered 2016-11-29 – 2016-12-06 (×27): 500000 [IU] via ORAL
  Filled 2016-11-29 (×27): qty 5

## 2016-11-29 MED ORDER — VANCOMYCIN HCL IN DEXTROSE 1-5 GM/200ML-% IV SOLN: 1000.0000 mg | Freq: Once | INTRAVENOUS | Status: DC

## 2016-11-29 MED ORDER — LEVOTHYROXINE SODIUM 75 MCG PO TABS
75.0000 ug | ORAL_TABLET | Freq: Every day | ORAL | Status: DC
  Administered 2016-11-30 – 2016-12-06 (×7): 75 ug via ORAL
  Filled 2016-11-29 (×7): qty 1

## 2016-11-29 MED ORDER — ACETAMINOPHEN 325 MG PO TABS
650.0000 mg | ORAL_TABLET | Freq: Four times a day (QID) | ORAL | Status: DC | PRN
Start: 2016-11-29 — End: 2016-12-08
  Administered 2016-11-29 – 2016-12-06 (×9): 650 mg via ORAL
  Filled 2016-11-29 (×11): qty 2

## 2016-11-29 MED ORDER — SODIUM CHLORIDE 0.9% FLUSH
3.0000 mL | Freq: Two times a day (BID) | INTRAVENOUS | Status: DC
  Administered 2016-11-29 – 2016-12-06 (×14): 3 mL via INTRAVENOUS

## 2016-11-29 MED ORDER — PANTOPRAZOLE SODIUM 40 MG PO TBEC
40.0000 mg | DELAYED_RELEASE_TABLET | Freq: Every day | ORAL | Status: DC
  Administered 2016-11-30 – 2016-12-06 (×7): 40 mg via ORAL
  Filled 2016-11-29 (×8): qty 1

## 2016-11-29 MED ORDER — TORSEMIDE 20 MG PO TABS
40.0000 mg | ORAL_TABLET | Freq: Two times a day (BID) | ORAL | Status: DC
Start: 2016-11-30 — End: 2016-12-06
  Administered 2016-11-30 – 2016-12-06 (×13): 40 mg via ORAL
  Filled 2016-11-29 (×13): qty 2

## 2016-11-29 MED ORDER — ENOXAPARIN SODIUM 40 MG/0.4ML ~~LOC~~ SOLN
40.0000 mg | SUBCUTANEOUS | Status: DC
  Administered 2016-11-29 – 2016-12-05 (×7): 40 mg via SUBCUTANEOUS
  Filled 2016-11-29 (×7): qty 0.4

## 2016-11-29 MED ORDER — ALBUTEROL SULFATE (2.5 MG/3ML) 0.083% IN NEBU
5.0000 mg | INHALATION_SOLUTION | Freq: Once | RESPIRATORY_TRACT | Status: AC
  Administered 2016-11-29: 5 mg via RESPIRATORY_TRACT
  Filled 2016-11-29: qty 6

## 2016-11-29 MED ORDER — METHYLPREDNISOLONE SODIUM SUCC 125 MG IJ SOLR
60.0000 mg | Freq: Two times a day (BID) | INTRAMUSCULAR | Status: DC
  Administered 2016-11-29 – 2016-12-05 (×12): 60 mg via INTRAVENOUS
  Filled 2016-11-29 (×12): qty 2

## 2016-11-29 MED ORDER — DILTIAZEM HCL 25 MG/5ML IV SOLN
15.0000 mg | Freq: Once | INTRAVENOUS | Status: AC
  Administered 2016-11-29: 15 mg via INTRAVENOUS
  Filled 2016-11-29: qty 5

## 2016-11-29 MED ORDER — DILTIAZEM HCL 100 MG IV SOLR
5.0000 mg/h | Freq: Once | INTRAVENOUS | Status: AC
  Administered 2016-11-29: 5 mg/h via INTRAVENOUS
  Filled 2016-11-29: qty 100

## 2016-11-29 MED ORDER — ARFORMOTEROL TARTRATE 15 MCG/2ML IN NEBU
15.0000 ug | INHALATION_SOLUTION | Freq: Two times a day (BID) | RESPIRATORY_TRACT | Status: DC
  Administered 2016-11-30 – 2016-12-06 (×12): 15 ug via RESPIRATORY_TRACT
  Filled 2016-11-29 (×15): qty 2

## 2016-11-29 MED ORDER — FUROSEMIDE 10 MG/ML IJ SOLN
60.0000 mg | Freq: Once | INTRAMUSCULAR | Status: AC
  Administered 2016-11-29: 60 mg via INTRAVENOUS
  Filled 2016-11-29: qty 6

## 2016-11-29 MED ORDER — ACETAMINOPHEN 650 MG RE SUPP
650.0000 mg | Freq: Four times a day (QID) | RECTAL | Status: DC | PRN
  Administered 2016-12-04: 650 mg via RECTAL

## 2016-11-29 MED ORDER — POTASSIUM CHLORIDE CRYS ER 20 MEQ PO TBCR
20.0000 meq | EXTENDED_RELEASE_TABLET | Freq: Two times a day (BID) | ORAL | Status: DC
  Administered 2016-11-29 – 2016-12-06 (×14): 20 meq via ORAL
  Filled 2016-11-29 (×14): qty 1

## 2016-11-29 MED ORDER — OSELTAMIVIR PHOSPHATE 30 MG PO CAPS
30.0000 mg | ORAL_CAPSULE | Freq: Two times a day (BID) | ORAL | Status: AC
  Administered 2016-11-29 – 2016-12-04 (×10): 30 mg via ORAL
  Filled 2016-11-29 (×11): qty 1

## 2016-11-29 MED ORDER — BUDESONIDE 0.5 MG/2ML IN SUSP
0.5000 mg | Freq: Two times a day (BID) | RESPIRATORY_TRACT | Status: DC
  Administered 2016-11-30 – 2016-12-06 (×13): 0.5 mg via RESPIRATORY_TRACT
  Filled 2016-11-29 (×13): qty 2

## 2016-11-29 MED ORDER — LORAZEPAM 2 MG/ML IJ SOLN
0.5000 mg | Freq: Once | INTRAMUSCULAR | Status: AC
  Administered 2016-11-29: 0.5 mg via INTRAVENOUS
  Filled 2016-11-29: qty 1

## 2016-11-29 MED ORDER — CEFEPIME-DEXTROSE 2 GM/50ML IV SOLR
2.0000 g | Freq: Once | INTRAVENOUS | Status: DC
  Filled 2016-11-29: qty 50

## 2016-11-29 MED ORDER — MAGNESIUM SULFATE 2 GM/50ML IV SOLN
2.0000 g | Freq: Once | INTRAVENOUS | Status: AC
  Administered 2016-11-29: 2 g via INTRAVENOUS
  Filled 2016-11-29: qty 50

## 2016-11-29 MED ORDER — DILTIAZEM HCL ER COATED BEADS 300 MG PO CP24
300.0000 mg | ORAL_CAPSULE | Freq: Every day | ORAL | Status: DC
  Administered 2016-11-30 – 2016-12-06 (×7): 300 mg via ORAL
  Filled 2016-11-29 (×8): qty 1

## 2016-11-29 MED ORDER — POLYETHYLENE GLYCOL 3350 17 G PO PACK: 17.0000 g | PACK | Freq: Every day | ORAL | Status: DC | PRN

## 2016-11-29 MED ORDER — ALBUTEROL SULFATE (2.5 MG/3ML) 0.083% IN NEBU
2.5000 mg | INHALATION_SOLUTION | RESPIRATORY_TRACT | Status: DC | PRN
  Administered 2016-12-01 – 2016-12-06 (×3): 2.5 mg via RESPIRATORY_TRACT
  Filled 2016-11-29 (×3): qty 3

## 2016-11-29 MED ORDER — DILTIAZEM HCL 100 MG IV SOLR: 5.0000 mg/h | INTRAVENOUS | Status: DC

## 2016-11-29 MED ORDER — LORATADINE 10 MG PO TABS
10.0000 mg | ORAL_TABLET | Freq: Every day | ORAL | Status: DC
  Administered 2016-11-30 – 2016-12-06 (×7): 10 mg via ORAL
  Filled 2016-11-29 (×7): qty 1

## 2016-11-29 MED ORDER — DEXTROSE 5 % IV SOLN
2.0000 g | Freq: Once | INTRAVENOUS | Status: DC
  Filled 2016-11-29: qty 2

## 2016-11-29 NOTE — Progress Notes (Signed)
CCMD notified this RN that patient had converted to NSR with a HR of 66. Notified MD Hugelmeyer and order given to discontinue cardizem drip. Nursing staff will continue to monitor for any changes in patient status. Earleen Reaper, RN

## 2016-11-29 NOTE — ED Notes (Signed)
Attempted to give report, but patient's nurse is unavailable.  I talked with Kristine Garbe CN and left my number for them to call me as soon as possible.

## 2016-11-29 NOTE — H&P (Signed)
[facility] NAME: Toni [name]    MR#:  [id number]  DATE OF BIRTH:  [birth year]  DATE OF ADMISSION:  11/29/2016  PRIMARY CARE PHYSICIAN: Pcp Not In System   REQUESTING/REFERRING PHYSICIAN: Dr. Quentin Cornwall  CHIEF COMPLAINT:   Chief Complaint  Patient presents with  . Shortness of Breath    HISTORY OF PRESENT ILLNESS:  Toni [name]  is a 82 y.o. female with a known history of COPD, diastolic CHF, atrial fibrillation, hypertension who was recently in the hospital for COPD exacerbation and was discharged on 11/23/2016 returns with worsening shortness of breath. Patient felt well at discharge. She finished her prednisone yesterday. They morning she got acutely short of breath and EMS was called. She took multiple breathing treatments at home without improvement. Received further breathing treatments to the emergency medical services. Breathing is mildly improved but continues to be short of breath with significant wheezing. She did have atrial fibrillation with rapid ventricular rate in the emergency room and has been placed on Cardizem drip. No orthopnea or lower extremity edema. Chest x-ray shows some atelectasis but no pneumonia.  PAST MEDICAL HISTORY:   Past Medical History:  Diagnosis Date  . Atypical atrial flutter ([place]) 11/16/2013   a. CHA2DS2VASc = 5-->prev on xarelto, d/c'd due to bleeding skin tear;  c. 11/2013->Placed on flecainide.  . Cataract   . Chronic diastolic CHF (congestive heart failure) ([place])    a. 11/2013 Echo: EF 55-60%, mild AS/MR, mod dil LA/RA, PASP 21mHg.  .Marland KitchenChronic diastolic heart failure ([facility]   . Collagen vascular disease ([facility]   . COPD (chronic obstructive pulmonary disease) ([place]   . Essential hypertension, benign   . Hypokalemia   . Hypothyroidism   . 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 Lung cancer ([facility]   . Lung nodule    Followed by Dr. FRaul Del . PAF (paroxysmal atrial  fibrillation) ([place]    a. 11/2013 Echo: EF 55-60%, mild AS/MR, mod dil LA/RA, PASP 350mg;  b. CHA2DS2VASc = 5-->prev on xarelto, d/c'd due to bleeding skin tear;  c. 11/2013->Placed on flecainide.    PAST SURGICAL HISTORY:   Past Surgical History:  Procedure Laterality Date  . APPENDECTOMY    . EYE SURGERY    . RIGHT HEART CATH N/A 11/17/2016   Procedure: Right Heart Cath;  Surgeon: MuWellington HampshireMD;  [facility];  Service: Cardiovascular;  Laterality: N/A;  . TOTAL KNEE ARTHROPLASTY Bilateral     SOCIAL HISTORY:   Social History  Substance Use Topics  . Smoking status: Former Smoker    Packs/day: 1.50    Years: 50.00    Types: Cigarettes    Quit date: 09/18/2002  . Smokeless tobacco: Never Used  . Alcohol use No    FAMILY HISTORY:   Family History  Problem Relation Age of Onset  . CAD Father   . Heart disease Father   . Heart attack Father   . Arrhythmia Sister     DRUG ALLERGIES:   Allergies  Allergen Reactions  . Belladonna Alkaloids Anaphylaxis and Other (See Comments)    Pt states that this medication makes her eyes cross.    . Dabigatran Other (See Comments)    REVIEW OF SYSTEMS:   Review of Systems  Constitutional: Positive for malaise/fatigue. Negative for chills, fever and weight loss.  HENT: Negative for hearing loss and nosebleeds.   Eyes: Negative for blurred  vision, double vision and pain.  Respiratory: Positive for cough, shortness of breath and wheezing. Negative for hemoptysis and sputum production.   Cardiovascular: Positive for palpitations. Negative for chest pain, orthopnea and leg swelling.  Gastrointestinal: Negative for abdominal pain, constipation, diarrhea, nausea and vomiting.  Genitourinary: Negative for dysuria and hematuria.  Musculoskeletal: Positive for myalgias. Negative for back pain and falls.  Skin: Negative for rash.  Neurological: Positive for weakness. Negative for dizziness, tremors, sensory change,  speech change, focal weakness, seizures and headaches.  Endo/Heme/Allergies: Does not bruise/bleed easily.  Psychiatric/Behavioral: Negative for depression and memory loss. The patient is not nervous/anxious.     MEDICATIONS AT HOME:   Prior to Admission medications   Medication Sig Start Date End Date Taking? Authorizing Provider  albuterol (PROVENTIL HFA) 108 (90 Base) MCG/ACT inhaler Inhale 2 puffs into the lungs every 4 (four) hours as needed for wheezing or shortness of breath. 07/23/16  Yes Carrie Mew, MD  ALPRAZolam Duanne Moron) 0.25 MG tablet Take 1 tablet (0.25 mg total) by mouth at bedtime as needed for anxiety. 11/19/16  Yes Epifanio Lesches, MD  budesonide (PULMICORT) 0.5 MG/2ML nebulizer solution Take 2 mLs (0.5 mg total) by nebulization 2 (two) times daily. 06/08/15  Yes Flora Lipps, MD  cyanocobalamin (,VITAMIN B-12,) 1000 MCG/ML injection Vit B12 1000 mcg IM once a week for 4 weeks then once a month for 4 months 10/09/16  Yes Historical Provider, MD  diltiazem (CARDIZEM CD) 300 MG 24 hr capsule Take 300 mg by mouth daily.   Yes Historical Provider, MD  formoterol (PERFOROMIST) 20 MCG/2ML nebulizer solution Take 2 mLs (20 mcg total) by nebulization 2 (two) times daily. 06/08/15  Yes Flora Lipps, MD  ipratropium-albuterol (DUONEB) 0.5-2.5 (3) MG/3ML SOLN Take 3 mLs by nebulization every 4 (four) hours as needed (for wheezing/shortness of breath). DX Code: J44.9   DX: COPD 08/29/16  Yes Wilhelmina Mcardle, MD  levothyroxine (SYNTHROID, LEVOTHROID) 75 MCG tablet Take 75 mcg by mouth daily before breakfast.   Yes Historical Provider, MD  loratadine (CLARITIN) 10 MG tablet Take 10 mg by mouth daily.   Yes Historical Provider, MD  nystatin (MYCOSTATIN) 100000 UNIT/ML suspension Take 5 mLs (500,000 Units total) by mouth 4 (four) times daily. 11/19/16  Yes Epifanio Lesches, MD  omega-3 acid ethyl esters (LOVAZA) 1 g capsule Take 1 g by mouth daily.   Yes Historical Provider, MD  omeprazole  (PRILOSEC) 20 MG capsule Take 20 mg by mouth 2 (two) times daily before a meal.   Yes Historical Provider, MD  potassium chloride (K-DUR,KLOR-CON) 10 MEQ tablet Take 2 tablets (20 mEq total) by mouth 2 (two) times daily. 08/03/16  Yes Minna Merritts, MD  torsemide (DEMADEX) 20 MG tablet Take 2 tablets (40 mg total) by mouth 2 (two) times daily. 11/19/16  Yes Epifanio Lesches, MD  predniSONE (STERAPRED UNI-PAK 21 TAB) 10 MG (21) TBPK tablet Taper by 10 mg daily Patient not taking: Reported on 11/29/2016 11/19/16   Epifanio Lesches, MD     VITAL SIGNS:  Blood pressure (!) 145/60, pulse 81, temperature 98.1 F (36.7 C), temperature source Axillary, resp. rate (!) 22, SpO2 95 %.  PHYSICAL EXAMINATION:  Physical Exam  GENERAL:  82 y.o.-year-old patient lying in the bed with Conversational dyspnea EYES: Pupils equal, round, reactive to light and accommodation. No scleral icterus. Extraocular muscles intact.  HEENT: Head atraumatic, normocephalic. Oropharynx and nasopharynx clear. No oropharyngeal erythema, moist oral mucosa  NECK:  Supple, no jugular venous distention.  No thyroid enlargement, no tenderness.  LUNGS: Increased work of breathing with bilateral wheezing and decreased air entry CARDIOVASCULAR: Irregularly irregular and tachycardic ABDOMEN: Soft, nontender, nondistended. Bowel sounds present. No organomegaly or mass.  EXTREMITIES: No pedal edema, cyanosis, or clubbing. + 2 pedal & radial pulses b/l.   NEUROLOGIC: Cranial nerves II through XII are intact. No focal Motor or sensory deficits appreciated b/l PSYCHIATRIC: The patient is alert and oriented x 3. Good affect.  SKIN: No obvious rash, lesion, or ulcer.   LABORATORY PANEL:   CBC  Recent Labs Lab 11/29/16 1347  WBC 8.3  HGB 11.1*  HCT 33.7*  PLT 166   ------------------------------------------------------------------------------------------------------------------  Chemistries   Recent Labs Lab 11/29/16 1347   NA 141  K 4.1  CL 95*  CO2 33*  GLUCOSE 117*  BUN 45*  CREATININE 1.15*  CALCIUM 8.6*  MG 2.0  AST 27  ALT 17  ALKPHOS 65  BILITOT 1.1   ------------------------------------------------------------------------------------------------------------------  Cardiac Enzymes  Recent Labs Lab 11/29/16 1347  TROPONINI 0.05*   ------------------------------------------------------------------------------------------------------------------  RADIOLOGY:  Dg Chest Portable 1 View  Result Date: 11/29/2016 CLINICAL DATA:  Shortness of breath, weakness, acute respiratory distress question CHF or pneumonia, history COPD, hypertension, atrial flutter and paroxysmal atrial fibrillation, collagen vascular disease EXAM: PORTABLE CHEST 1 VIEW COMPARISON:  Portable exam 1240 hours compared 11/21/2016 FINDINGS: Enlargement of cardiac silhouette. Calcified tortuous thoracic aorta. Mediastinal contours and pulmonary vascularity otherwise normal. Scarring and volume loss RIGHT upper lobe. Underlying emphysematous and bronchitic changes with minimal bibasilar atelectasis. No definite acute infiltrate, pleural effusion or pneumothorax. Diffuse osseous demineralization with probable BILATERAL chronic rotator cuff tears. IMPRESSION: Enlargement of cardiac silhouette. Aortic atherosclerosis. COPD changes with bibasilar atelectasis and RIGHT upper lobe scarring/volume loss. Electronically Signed   By: Lavonia Dana M.D.   On: 11/29/2016 12:54     IMPRESSION AND PLAN:   * Acute COPD exacerbation with acute hypoxic respiratory failure -IV steroids, Antibiotics - Scheduled Nebulizers - Inhalers -Wean O2 as tolerated - Consult pulmonary if no improvement  * Atrial fibrillation with rapid ventricular rate Patient has been placed on a Cardizem drip in the emergency room. Her rapid ventricular rate could be due to her COPD exacerbation or the multiple breathing treatments she has received. We'll give her home dose  of Cardizem and try to wean her off the Cardizem drip. Anticoagulation due to her anemia. No bleeding.  * Acute on chronic diastolic CHF, mild We'll give 1 dose of IV Lasix. Monitor input and output along with BUN and creatinine. Continue torsemide from tomorrow.  * Hypertension Continue medications  * DVT prophylaxis with Lovenox  All the records are reviewed and case discussed with ED provider. Management plans discussed with the patient, family and they are in agreement.  CODE STATUS: FULL CODE  TOTAL TIME TAKING CARE OF THIS PATIENT: 40 minutes.   Hillary Bow R M.D on 11/29/2016 at 4:07 PM  Between 7am to 6pm - Pager - [phone]  After 6pm go to [email or web address] - password [email or web address] [facility]  Office  [phone]  CC: Primary care physician; Pcp Not In System  Note: This dictation was prepared with Dragon dictation along with smaller phrase technology. Any transcriptional errors that result from this process are unintentional.

## 2016-11-29 NOTE — ED Provider Notes (Signed)
[facility] Emergency Department Provider Note    First MD Initiated Contact with Patient 11/29/16 1237     (approximate)  I have reviewed the triage vital signs and the nursing notes.   HISTORY  Chief Complaint Shortness of Breath    HPI Toni [name] is a 82 y.o. female with a history of COPD as well as CHF presents with acute respiratory distress. Patient was recently hospitalized for COPD exacerbation and discharged last Thursday. EMS was called out as seen due to worsening shortness of breath. She was found to be in respiratory distress, tripoding with market tachypnea. Patient was given multiple nebulizer treatments as well as 125 of Solu-Medrol. Had significant improvement in her breathing after nebulizer treatments. Patient also going in and out of rapid A. fib according to the EMS. Had recent excessive workup in the hospital with negative results of pulmonary embolism or DVT study.   Past Medical History:  Diagnosis Date  . Atypical atrial flutter ([place]) 11/16/2013   a. CHA2DS2VASc = 5-->prev on xarelto, d/c'd due to bleeding skin tear;  c. 11/2013->Placed on flecainide.  . Cataract   . Chronic diastolic CHF (congestive heart failure) ([place])    a. 11/2013 Echo: EF 55-60%, mild AS/MR, mod dil LA/RA, PASP 75mHg.  .Marland KitchenChronic diastolic heart failure (HCowiche   . Collagen vascular disease (HCousins Island   . COPD (chronic obstructive pulmonary disease) (HC[place]   . Essential hypertension, benign   . Hypokalemia   . Hypothyroidism   . 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 Lung cancer (HVi[place]   . Lung nodule    Followed by Dr. FRaul Del . PAF (paroxysmal atrial fibrillation) (HE[place]    a. 11/2013 Echo: EF 55-60%, mild AS/MR, mod dil LA/RA, PASP 358mg;  b. CHA2DS2VASc = 5-->prev on xarelto, d/c'd due to bleeding skin tear;  c. 11/2013->Placed on flecainide.   Family History  Problem Relation Age of Onset  . CAD Father   . Heart disease Father   .  Heart attack Father   . Arrhythmia Sister    Past Surgical History:  Procedure Laterality Date  . APPENDECTOMY    . EYE SURGERY    . RIGHT HEART CATH N/A 11/17/2016   Procedure: Right Heart Cath;  Surgeon: MuWellington HampshireMD;  [facility];  Service: Cardiovascular;  Laterality: N/A;  . TOTAL KNEE ARTHROPLASTY Bilateral    Patient Active Problem List   Diagnosis Date Noted  . Acute respiratory distress 11/22/2016  . Elevated troponin 11/22/2016  . Dyspnea   . Advance care planning   . Goals of care, counseling/discussion   . Palliative care by specialist   . Congestive heart failure (HCSheridan  . PAF (paroxysmal atrial fibrillation) (HCHockessin02/22/2018  . Cough   . Anemia in chronic kidney disease 11/07/2016  . Aortic valve stenosis, moderate 08/03/2016  . Adjustment disorder with anxiety 02/23/2016  . Dyspepsia   . Esophageal reflux   . Encounter for anticoagulation discussion and counseling 11/17/2015  . Fall 11/17/2015  . Essential hypertension, benign   . Lung infiltrate on CT 06/08/2015  . Chronic diastolic CHF (congestive heart failure) (HCRio Dell08/29/2016  . Insomnia 04/17/2014  . Squamous cell lung cancer, right (HCSloan07/06/2014  . Postnasal drip 03/05/2014  . Hyperlipidemia 12/04/2013  . Bilateral leg edema 12/04/2013  . COPD exacerbation (HCBellaire03/11/2013  . Atypical atrial flutter (HCKirvin03/09/2013  . COPD (chronic obstructive pulmonary disease) (HCConneaut Lakeshore02/28/2015  .  Chronic kidney disease 08/24/2013  . Essential (primary) hypertension 08/24/2013  . Adult hypothyroidism 08/24/2013  . Acute exacerbation of chronic obstructive airways disease ([place]) 08/24/2013  . Chronic obstructive pulmonary disease with acute exacerbation ([place]) 08/24/2013      Prior to Admission medications   Medication Sig Start Date End Date Taking? Authorizing Provider  albuterol (PROVENTIL HFA) 108 (90 Base) MCG/ACT inhaler Inhale 2 puffs into the lungs every 4 (four) hours as  needed for wheezing or shortness of breath. 07/23/16  Yes Carrie Mew, MD  ALPRAZolam Duanne Moron) 0.25 MG tablet Take 1 tablet (0.25 mg total) by mouth at bedtime as needed for anxiety. 11/19/16  Yes Epifanio Lesches, MD  budesonide (PULMICORT) 0.5 MG/2ML nebulizer solution Take 2 mLs (0.5 mg total) by nebulization 2 (two) times daily. 06/08/15  Yes Flora Lipps, MD  cyanocobalamin (,VITAMIN B-12,) 1000 MCG/ML injection Vit B12 1000 mcg IM once a week for 4 weeks then once a month for 4 months 10/09/16  Yes Historical Provider, MD  diltiazem (CARDIZEM CD) 300 MG 24 hr capsule Take 300 mg by mouth daily.   Yes Historical Provider, MD  formoterol (PERFOROMIST) 20 MCG/2ML nebulizer solution Take 2 mLs (20 mcg total) by nebulization 2 (two) times daily. 06/08/15  Yes Flora Lipps, MD  ipratropium-albuterol (DUONEB) 0.5-2.5 (3) MG/3ML SOLN Take 3 mLs by nebulization every 4 (four) hours as needed (for wheezing/shortness of breath). DX Code: J44.9   DX: COPD 08/29/16  Yes Wilhelmina Mcardle, MD  levothyroxine (SYNTHROID, LEVOTHROID) 75 MCG tablet Take 75 mcg by mouth daily before breakfast.   Yes Historical Provider, MD  loratadine (CLARITIN) 10 MG tablet Take 10 mg by mouth daily.   Yes Historical Provider, MD  nystatin (MYCOSTATIN) 100000 UNIT/ML suspension Take 5 mLs (500,000 Units total) by mouth 4 (four) times daily. 11/19/16  Yes Epifanio Lesches, MD  omega-3 acid ethyl esters (LOVAZA) 1 g capsule Take 1 g by mouth daily.   Yes Historical Provider, MD  omeprazole (PRILOSEC) 20 MG capsule Take 20 mg by mouth 2 (two) times daily before a meal.   Yes Historical Provider, MD  potassium chloride (K-DUR,KLOR-CON) 10 MEQ tablet Take 2 tablets (20 mEq total) by mouth 2 (two) times daily. 08/03/16  Yes Minna Merritts, MD  torsemide (DEMADEX) 20 MG tablet Take 2 tablets (40 mg total) by mouth 2 (two) times daily. 11/19/16  Yes Epifanio Lesches, MD  predniSONE (STERAPRED UNI-PAK 21 TAB) 10 MG (21) TBPK tablet Taper  by 10 mg daily Patient not taking: Reported on 11/29/2016 11/19/16   Epifanio Lesches, MD    Allergies Belladonna alkaloids and Dabigatran    Social History Social History  Substance Use Topics  . Smoking status: Former Smoker    Packs/day: 1.50    Years: 50.00    Types: Cigarettes    Quit date: 09/18/2002  . Smokeless tobacco: Never Used  . Alcohol use No    Review of Systems Patient denies headaches, rhinorrhea, blurry vision, numbness, shortness of breath, chest pain, edema, cough, abdominal pain, nausea, vomiting, diarrhea, dysuria, fevers, rashes or hallucinations unless otherwise stated above in HPI. ____________________________________________   PHYSICAL EXAM:  VITAL SIGNS: Vitals:   11/29/16 1300 11/29/16 1430  BP: (!) 155/61 (!) 145/60  Pulse: (!) 146 81  Resp: 18 (!) 22  Temp:      Constitutional: Alert and oriented. Markedly tachypnic in moderate  respiratory distress Eyes: Conjunctivae are normal. PERRL. EOMI. Head: Atraumatic. Nose: No congestion/rhinnorhea. Mouth/Throat: Mucous membranes are moist.  Oropharynx non-erythematous. Neck: No stridor. Painless ROM. No cervical spine tenderness to palpation Hematological/Lymphatic/Immunilogical: No cervical lymphadenopathy. Cardiovascular: tachycardic, irregularly irregular. Grossly normal heart sounds.  Good peripheral circulation. Respiratory: tachypneic, use of accessory muscles diffuse wheezing and thick productive cough Gastrointestinal: Soft and nontender. No distention. No abdominal bruits. No CVA tenderness. Musculoskeletal: No lower extremity tenderness nor edema.  No joint effusions. Neurologic:  Normal speech and language. No gross focal neurologic deficits are appreciated. No gait instability. Skin:  Skin is warm, dry and intact. No rash noted. Psychiatric: Mood and affect are normal. Speech and behavior are normal.  ____________________________________________   LABS (all labs ordered are  listed, but only abnormal results are displayed)  Results for orders placed or performed during the hospital encounter of 11/29/16 (from the past 24 hour(s))  CBC with Differential/Platelet     Status: Abnormal   Collection Time: 11/29/16  1:47 PM  Result Value Ref Range   WBC 8.3 3.6 - 11.0 K/uL   RBC 3.93 3.80 - 5.20 MIL/uL   Hemoglobin 11.1 (L) 12.0 - 16.0 g/dL   HCT 33.7 (L) 35.0 - 47.0 %   MCV 85.8 80.0 - 100.0 fL   MCH 28.2 26.0 - 34.0 pg   MCHC 32.9 32.0 - 36.0 g/dL   RDW 17.3 (H) 11.5 - 14.5 %   Platelets 166 150 - 440 K/uL   Neutrophils Relative % 89 %   Neutro Abs 7.4 (H) 1.4 - 6.5 K/uL   Lymphocytes Relative 6 %   Lymphs Abs 0.5 (L) 1.0 - 3.6 K/uL   Monocytes Relative 4 %   Monocytes Absolute 0.3 0.2 - 0.9 K/uL   Eosinophils Relative 1 %   Eosinophils Absolute 0.1 0 - 0.7 K/uL   Basophils Relative 0 %   Basophils Absolute 0.0 0 - 0.1 K/uL  Comprehensive metabolic panel     Status: Abnormal   Collection Time: 11/29/16  1:47 PM  Result Value Ref Range   Sodium 141 135 - 145 mmol/L   Potassium 4.1 3.5 - 5.1 mmol/L   Chloride 95 (L) 101 - 111 mmol/L   CO2 33 (H) 22 - 32 mmol/L   Glucose, Bld 117 (H) 65 - 99 mg/dL   BUN 45 (H) 6 - 20 mg/dL   Creatinine, Ser 1.15 (H) 0.44 - 1.00 mg/dL   Calcium 8.6 (L) 8.9 - 10.3 mg/dL   Total Protein 7.1 6.5 - 8.1 g/dL   Albumin 3.9 3.5 - 5.0 g/dL   AST 27 15 - 41 U/L   ALT 17 14 - 54 U/L   Alkaline Phosphatase 65 38 - 126 U/L   Total Bilirubin 1.1 0.3 - 1.2 mg/dL   GFR calc non Af Amer 44 (L) >60 mL/min   GFR calc Af Amer 51 (L) >60 mL/min   Anion gap 13 5 - 15  Troponin I     Status: Abnormal   Collection Time: 11/29/16  1:47 PM  Result Value Ref Range   Troponin I 0.05 (HH) <0.03 ng/mL  Brain natriuretic peptide     Status: Abnormal   Collection Time: 11/29/16  1:47 PM  Result Value Ref Range   B Natriuretic Peptide 172.0 (H) 0.0 - 100.0 pg/mL  Magnesium     Status: None   Collection Time: 11/29/16  1:47 PM  Result  Value Ref Range   Magnesium 2.0 1.7 - 2.4 mg/dL  Blood gas, venous     Status: Abnormal (Preliminary result)   Collection Time: 11/29/16  1:47 PM  Result Value Ref Range   FIO2 PENDING    Delivery systems PENDING    pH, Ven 7.43 7.250 - 7.430   pCO2, Ven 59 44.0 - 60.0 mmHg   pO2, Ven 42.0 32.0 - 45.0 mmHg   Bicarbonate 39.2 (H) 20.0 - 28.0 mmol/L   Acid-Base Excess 12.7 (H) 0.0 - 2.0 mmol/L   O2 Saturation 79.0 %   Patient temperature 37.0    Collection site VEIN    Sample type VEIN    ____________________________________________  EKG My review and personal interpretation at Time: 12:50   Indication: sob  Rate: 140  Rhythm: afib with rvr Axis: normal Other: non specific st changes likely rate depndent ____________________________________________  RADIOLOGY  I personally reviewed all radiographic images ordered to evaluate for the above acute complaints and reviewed radiology reports and findings.  These findings were personally discussed with the patient.  Please see medical record for radiology report.  ____________________________________________   PROCEDURES  Procedure(s) performed:  Procedures    Critical Care performed: yes CRITICAL CARE Performed by: Merlyn Lot   Total critical care time: 30 minutes  Critical care time was exclusive of separately billable procedures and treating other patients.  Critical care was necessary to treat or prevent imminent or life-threatening deterioration.  Critical care was time spent personally by me on the following activities: development of treatment plan with patient and/or surrogate as well as nursing, discussions with consultants, evaluation of patient's response to treatment, examination of patient, obtaining history from patient or surrogate, ordering and performing treatments and interventions, ordering and review of laboratory studies, ordering and review of radiographic studies, pulse oximetry and re-evaluation  of patient's condition.  ____________________________________________   INITIAL IMPRESSION / ASSESSMENT AND PLAN / ED COURSE  Pertinent labs & imaging results that were available during my care of the patient were reviewed by me and considered in my medical decision making (see chart for details).  DDX: Asthma, copd, CHF, pna, ptx, malignancy, Pe, anemia   Aashna K Paulding is a 82 y.o. who presents to the ED with respiratory distress is described above. Symptoms improved with multiple nebulizer treatments and supplemental oxygen. Patient also with evidence of paroxysmal A. fib intermittently going into rapid ventricular response but is otherwise normotensive. Patient currently afebrile but with very thick productive cough and given her acute presentation I am concerned for pneumonia.  The patient will be placed on continuous pulse oximetry and telemetry for monitoring.  Laboratory evaluation will be sent to evaluate for the above complaints.     Clinical Course as of Nov 30 1534  Wed Nov 29, 2016  1326 Patient back in Afib with RVR,.  Will start cardizem bolus and gtt for rate control.  [PR]  1522 Patient reassessed. She is clinically improving but is still tachypneic with use of accessory muscles and speaking in short phrases. Based on her productive cough with leukocytosis with left shift and acute respiratory distress will order antibiotics. Given her recent admission hospital will provide treatment for healthcare associated pneumonia.  [PR]    Clinical Course User Index [PR] Merlyn Lot, MD     ____________________________________________   FINAL CLINICAL IMPRESSION(S) / ED DIAGNOSES  Final diagnoses:  COPD exacerbation ([place])  Acute respiratory distress  Atrial fibrillation with tachycardic ventricular rate (HCC)  Elevated troponin I level      NEW MEDICATIONS STARTED DURING THIS VISIT:  New Prescriptions   No medications on file     Note:  This document  was  prepared using Systems analyst and may include unintentional dictation errors.    Merlyn Lot, MD 11/29/16 1536

## 2016-11-29 NOTE — Progress Notes (Signed)
Patient arrived to 2A Room 237. Patient denies pain and all questions answered. Patient oriented to unit and Fall Safety Plan signed. Skin assessment completed with Toni [name] and skin intact. A&Ox4, VSS, and Afib on verified tele-box #[id number]. Nursing staff will continue to monitor for any changes in patient status. Earleen Reaper, RN

## 2016-11-29 NOTE — ED Triage Notes (Signed)
Pt to ED by EMS from home with c/o SOB. Pt recently hospitalized for pneumonia and discharged last Thursday. EMS gave 125 solumed, 2 albuterol and 1 duoneb. Pt afib but not new per EMS.

## 2016-11-30 LAB — BASIC METABOLIC PANEL
ANION GAP: 9 (ref 5–15)
BUN: 49 mg/dL — AB (ref 6–20)
CALCIUM: 8.4 mg/dL — AB (ref 8.9–10.3)
CO2: 34 mmol/L — ABNORMAL HIGH (ref 22–32)
Chloride: 96 mmol/L — ABNORMAL LOW (ref 101–111)
Creatinine, Ser: 1.12 mg/dL — ABNORMAL HIGH (ref 0.44–1.00)
GFR calc Af Amer: 53 mL/min — ABNORMAL LOW (ref >60)
GFR calc non Af Amer: 45 mL/min — ABNORMAL LOW (ref >60)
GLUCOSE: 114 mg/dL — AB (ref 65–99)
Potassium: 3.9 mmol/L (ref 3.5–5.1)
SODIUM: 139 mmol/L (ref 135–145)

## 2016-11-30 LAB — CBC
HCT: 30.8 % — ABNORMAL LOW (ref 35.0–47.0)
Hemoglobin: 10.2 g/dL — ABNORMAL LOW (ref 12.0–16.0)
MCH: 28.2 pg (ref 26.0–34.0)
MCHC: 33.3 g/dL (ref 32.0–36.0)
MCV: 84.8 fL (ref 80.0–100.0)
PLATELETS: 175 10*3/uL (ref 150–440)
RBC: 3.63 MIL/uL — ABNORMAL LOW (ref 3.80–5.20)
RDW: 16.6 % — AB (ref 11.5–14.5)
WBC: 7.5 10*3/uL (ref 3.6–11.0)

## 2016-11-30 MED ORDER — ALPRAZOLAM 0.25 MG PO TABS
0.2500 mg | ORAL_TABLET | Freq: Once | ORAL | Status: AC
  Administered 2016-11-30: 0.25 mg via ORAL
  Filled 2016-11-30: qty 1

## 2016-11-30 NOTE — Progress Notes (Signed)
[place] [facility] [place] NAME: Toni [name]    MR#:  [id number]  DATE OF BIRTH:  April 18, 1935  SUBJECTIVE:  CHIEF COMPLAINT:   Chief Complaint  Patient presents with  . Shortness of Breath   Still has shortness of breath and wheezing. Dry cough. In isolation for influenza.  REVIEW OF SYSTEMS:    Review of Systems  Constitutional: Positive for malaise/fatigue. Negative for chills and fever.  HENT: Negative for sore throat.   Eyes: Negative for blurred vision, double vision and pain.  Respiratory: Positive for cough, shortness of breath and wheezing. Negative for hemoptysis.   Cardiovascular: Negative for chest pain, palpitations, orthopnea and leg swelling.  Gastrointestinal: Negative for abdominal pain, constipation, diarrhea, heartburn, nausea and vomiting.  Genitourinary: Negative for dysuria and hematuria.  Musculoskeletal: Negative for back pain and joint pain.  Skin: Negative for rash.  Neurological: Positive for weakness. Negative for sensory change, speech change, focal weakness and headaches.  Endo/Heme/Allergies: Does not bruise/bleed easily.  Psychiatric/Behavioral: Negative for depression. The patient is not nervous/anxious.     DRUG ALLERGIES:   Allergies  Allergen Reactions  . Belladonna Alkaloids Anaphylaxis and Other (See Comments)    Pt states that this medication makes her eyes cross.    . Dabigatran Other (See Comments)    VITALS:  Blood pressure (!) 131/54, pulse 71, temperature 98.2 F (36.8 C), temperature source Oral, resp. rate 18, height '5\' 6"'$  (1.676 m), weight 83 kg (182 lb 14.4 oz), SpO2 98 %.  PHYSICAL EXAMINATION:   Physical Exam  GENERAL:  82 y.o.-year-old patient lying in the bed with no acute distress.  EYES: Pupils equal, round, reactive to light and accommodation. No scleral icterus. Extraocular muscles intact.  HEENT: Head atraumatic, normocephalic. Oropharynx and nasopharynx clear.  NECK:   Supple, no jugular venous distention. No thyroid enlargement, no tenderness.  LUNGS: Increased work of breathing with bilateral wheezing. Decreased air entry. CARDIOVASCULAR: S1, S2 normal. No murmurs, rubs, or gallops.  ABDOMEN: Soft, nontender, nondistended. Bowel sounds present. No organomegaly or mass.  EXTREMITIES: No cyanosis, clubbing or edema b/l.    NEUROLOGIC: Cranial nerves II through XII are intact. No focal Motor or sensory deficits b/l.   PSYCHIATRIC: The patient is alert and oriented x 3.  SKIN: No obvious rash, lesion, or ulcer.   LABORATORY PANEL:   CBC  Recent Labs Lab 11/30/16 0315  WBC 7.5  HGB 10.2*  HCT 30.8*  PLT 175   ------------------------------------------------------------------------------------------------------------------ Chemistries   Recent Labs Lab 11/29/16 1347 11/30/16 0315  NA 141 139  K 4.1 3.9  CL 95* 96*  CO2 33* 34*  GLUCOSE 117* 114*  BUN 45* 49*  CREATININE 1.15* 1.12*  CALCIUM 8.6* 8.4*  MG 2.0  --   AST 27  --   ALT 17  --   ALKPHOS 65  --   BILITOT 1.1  --    ------------------------------------------------------------------------------------------------------------------  Cardiac Enzymes  Recent Labs Lab 11/29/16 1347  TROPONINI 0.05*   ------------------------------------------------------------------------------------------------------------------  RADIOLOGY:  Dg Chest Portable 1 View  Result Date: 11/29/2016 CLINICAL DATA:  Shortness of breath, weakness, acute respiratory distress question CHF or pneumonia, history COPD, hypertension, atrial flutter and paroxysmal atrial fibrillation, collagen vascular disease EXAM: PORTABLE CHEST 1 VIEW COMPARISON:  Portable exam 1240 hours compared 11/21/2016 FINDINGS: Enlargement of cardiac silhouette. Calcified tortuous thoracic aorta. Mediastinal contours and pulmonary vascularity otherwise normal. Scarring and volume loss RIGHT upper lobe. Underlying emphysematous and  bronchitic changes with  minimal bibasilar atelectasis. No definite acute infiltrate, pleural effusion or pneumothorax. Diffuse osseous demineralization with probable BILATERAL chronic rotator cuff tears. IMPRESSION: Enlargement of cardiac silhouette. Aortic atherosclerosis. COPD changes with bibasilar atelectasis and RIGHT upper lobe scarring/volume loss. Electronically Signed   By: Lavonia Dana M.D.   On: 11/29/2016 12:54   ASSESSMENT AND PLAN:   * Influenza B. On Tamiflu. Isolation.  * Acute COPD exacerbation with acute hypoxic respiratory failure Due to influenza B -IV steroids, Antibiotics - Scheduled Nebulizers - Inhalers -Wean O2 as tolerated - Consult pulmonary if no improvement  * Atrial fibrillation with rapid ventricular rate Converted to normal sinus rhythm overnight. Off Cardizem drip. Continue home dose of Cardizem.  * Acute on chronic diastolic CHF, mild Improved. She wonders of IV Lasix on admission. Continue torsemide from home.  * Hypertension Continue medications  * DVT prophylaxis with Lovenox  All the records are reviewed and case discussed with Care Management/Social Workerr. Management plans discussed with the patient, family and they are in agreement.  CODE STATUS: FULL CODE  DVT Prophylaxis: SCDs  TOTAL TIME TAKING CARE OF THIS PATIENT: 35 minutes.   POSSIBLE D/C IN 2-3 DAYS, DEPENDING ON CLINICAL CONDITION.  Hillary Bow R M.D on 11/30/2016 at 12:14 PM  Between 7am to 6pm - Pager - [phone]  After 6pm go to [email or web address] - password [email or web address] [facility]  Office  [phone]  CC: Primary care physician; Pcp Not In System  Note: This dictation was prepared with Dragon dictation along with smaller phrase technology. Any transcriptional errors that result from this process are unintentional.

## 2016-12-01 ENCOUNTER — Inpatient Hospital Stay: Payer: Medicare Other

## 2016-12-01 ENCOUNTER — Ambulatory Visit: Payer: Medicare Other | Admitting: Family

## 2016-12-01 MED ORDER — LEVOFLOXACIN IN D5W 500 MG/100ML IV SOLN
500.0000 mg | Freq: Every day | INTRAVENOUS | Status: DC
Start: 2016-12-01 — End: 2016-12-03
  Administered 2016-12-01 – 2016-12-02 (×2): 500 mg via INTRAVENOUS
  Filled 2016-12-01 (×4): qty 100

## 2016-12-01 MED ORDER — ALPRAZOLAM 0.25 MG PO TABS
0.2500 mg | ORAL_TABLET | Freq: Two times a day (BID) | ORAL | Status: DC | PRN
  Administered 2016-12-01 – 2016-12-04 (×6): 0.25 mg via ORAL
  Filled 2016-12-01 (×6): qty 1

## 2016-12-01 MED ORDER — ALPRAZOLAM 0.25 MG PO TABS
0.2500 mg | ORAL_TABLET | Freq: Once | ORAL | Status: AC
  Administered 2016-12-01: 0.25 mg via ORAL

## 2016-12-01 NOTE — Progress Notes (Signed)
[place] at [place] NAME: Toni [name]    MR#:  [id number]  DATE OF BIRTH:  October 23, 1934  SUBJECTIVE:  CHIEF COMPLAINT:   Chief Complaint  Patient presents with  . Shortness of Breath   Feels weaker today. Myalgias Afebrile On 2 L O2  REVIEW OF SYSTEMS:    Review of Systems  Constitutional: Positive for malaise/fatigue. Negative for chills and fever.  HENT: Negative for sore throat.   Eyes: Negative for blurred vision, double vision and pain.  Respiratory: Positive for cough, shortness of breath and wheezing. Negative for hemoptysis.   Cardiovascular: Negative for chest pain, palpitations, orthopnea and leg swelling.  Gastrointestinal: Negative for abdominal pain, constipation, diarrhea, heartburn, nausea and vomiting.  Genitourinary: Negative for dysuria and hematuria.  Musculoskeletal: Negative for back pain and joint pain.  Skin: Negative for rash.  Neurological: Positive for weakness. Negative for sensory change, speech change, focal weakness and headaches.  Endo/Heme/Allergies: Does not bruise/bleed easily.  Psychiatric/Behavioral: Negative for depression. The patient is not nervous/anxious.     DRUG ALLERGIES:   Allergies  Allergen Reactions  . Belladonna Alkaloids Anaphylaxis and Other (See Comments)    Pt states that this medication makes her eyes cross.    . Dabigatran Other (See Comments)    VITALS:  Blood pressure (!) 147/59, pulse 71, temperature 97.7 F (36.5 C), temperature source Oral, resp. rate 18, height '5\' 6"'$  (1.676 m), weight 84.8 kg (186 lb 14.4 oz), SpO2 100 %.  PHYSICAL EXAMINATION:   Physical Exam  GENERAL:  82 y.o.-year-old patient lying in the bed with no acute distress.  EYES: Pupils equal, round, reactive to light and accommodation. No scleral icterus. Extraocular muscles intact.  HEENT: Head atraumatic, normocephalic. Oropharynx and nasopharynx clear.  NECK:  Supple, no jugular venous  distention. No thyroid enlargement, no tenderness.  LUNGS: Increased work of breathing with bilateral wheezing. Decreased air entry. CARDIOVASCULAR: S1, S2 normal. No murmurs, rubs, or gallops.  ABDOMEN: Soft, nontender, nondistended. Bowel sounds present. No organomegaly or mass.  EXTREMITIES: No cyanosis, clubbing or edema b/l.    NEUROLOGIC: Cranial nerves II through XII are intact. No focal Motor or sensory deficits b/l.   PSYCHIATRIC: The patient is alert and oriented x 3.  SKIN: No obvious rash, lesion, or ulcer.   LABORATORY PANEL:   CBC  Recent Labs Lab 11/30/16 0315  WBC 7.5  HGB 10.2*  HCT 30.8*  PLT 175   ------------------------------------------------------------------------------------------------------------------ Chemistries   Recent Labs Lab 11/29/16 1347 11/30/16 0315  NA 141 139  K 4.1 3.9  CL 95* 96*  CO2 33* 34*  GLUCOSE 117* 114*  BUN 45* 49*  CREATININE 1.15* 1.12*  CALCIUM 8.6* 8.4*  MG 2.0  --   AST 27  --   ALT 17  --   ALKPHOS 65  --   BILITOT 1.1  --    ------------------------------------------------------------------------------------------------------------------  Cardiac Enzymes  Recent Labs Lab 11/29/16 1347  TROPONINI 0.05*   ------------------------------------------------------------------------------------------------------------------  RADIOLOGY:  Dg Chest Portable 1 View  Result Date: 11/29/2016 CLINICAL DATA:  Shortness of breath, weakness, acute respiratory distress question CHF or pneumonia, history COPD, hypertension, atrial flutter and paroxysmal atrial fibrillation, collagen vascular disease EXAM: PORTABLE CHEST 1 VIEW COMPARISON:  Portable exam 1240 hours compared 11/21/2016 FINDINGS: Enlargement of cardiac silhouette. Calcified tortuous thoracic aorta. Mediastinal contours and pulmonary vascularity otherwise normal. Scarring and volume loss RIGHT upper lobe. Underlying emphysematous and bronchitic changes with  minimal bibasilar atelectasis. No  definite acute infiltrate, pleural effusion or pneumothorax. Diffuse osseous demineralization with probable BILATERAL chronic rotator cuff tears. IMPRESSION: Enlargement of cardiac silhouette. Aortic atherosclerosis. COPD changes with bibasilar atelectasis and RIGHT upper lobe scarring/volume loss. Electronically Signed   By: Lavonia Dana M.D.   On: 11/29/2016 12:54   ASSESSMENT AND PLAN:   * Influenza B. On Tamiflu. Isolation. Check CXR today. Flutter valve  * Acute COPD exacerbation with acute hypoxic respiratory failure Due to influenza B -IV steroids, Antibiotics - Scheduled Nebulizers - Inhalers -Wean O2 as tolerated  * Atrial fibrillation with rapid ventricular rate IN and out of Afib today. Rate controlled  * Acute on chronic diastolic CHF, mild Improved. She wonders of IV Lasix on admission. Continue torsemide from home.  * Hypertension Continue medications  * DVT prophylaxis with Lovenox  All the records are reviewed and case discussed with Care Management/Social Workerr. Management plans discussed with the patient, family and they are in agreement.  CODE STATUS: FULL CODE  DVT Prophylaxis: SCDs  TOTAL TIME TAKING CARE OF THIS PATIENT: 35 minutes  POSSIBLE D/C IN 2-3 DAYS, DEPENDING ON CLINICAL CONDITION.  Hillary Bow R M.D on 12/01/2016 at 11:47 AM  Between 7am to 6pm - Pager - [phone]  After 6pm go to [email or web address] - password [email or web address] [facility]  Office  [phone]  CC: Primary care physician; Pcp Not In System  Note: This dictation was prepared with Dragon dictation along with smaller phrase technology. Any transcriptional errors that result from this process are unintentional.

## 2016-12-01 NOTE — Progress Notes (Signed)
PT Cancellation Note  Patient Details Name: Toni [name] MRN: [id number] DOB: Aug 11, 1935   Cancelled Treatment:    Reason Eval/Treat Not Completed: Patient declined, no reason specified. Chart reviewed. Attempted to evaluate patient. Pt reports she is feeling very unwell and refuses PT evaluation at this time. Agrees to participate on separate date when she is feeling better. Will attempt PT evaluation on later date as pt is appropriate and able to participate.   Lyndel Safe Audreyana Huntsberry PT, DPT   Laisha Rau 12/01/2016, 2:34 PM

## 2016-12-01 NOTE — Care Management Important Message (Signed)
Important Message  Patient Details  Name: Toni [name] MRN: [id number] Date of Birth: 02/19/1935   Medicare Important Message Given:  Yes    Katrina Stack, RN 12/01/2016, 2:58 PM

## 2016-12-01 NOTE — Care Management (Signed)
Patient admitted from home with exacerbation of CHF. Has chronic home 02 through [facility] and is currently being followed by [facility] SN and PT. She had a previous discharge on 3/8 for similar presentation.  It is documented that she was instructed by pulmonologist office to proceed to the ED. It does not sound like home health agency was contacted and unable to confirm as agency is now closed.

## 2016-12-02 MED ORDER — DOCUSATE SODIUM 100 MG PO CAPS
200.0000 mg | ORAL_CAPSULE | Freq: Two times a day (BID) | ORAL | Status: DC
  Administered 2016-12-02 – 2016-12-06 (×9): 200 mg via ORAL
  Filled 2016-12-02 (×9): qty 2

## 2016-12-02 MED ORDER — TRAZODONE HCL 50 MG PO TABS
50.0000 mg | ORAL_TABLET | Freq: Once | ORAL | Status: AC
Start: 2016-12-02 — End: 2016-12-02
  Administered 2016-12-02: 50 mg via ORAL
  Filled 2016-12-02: qty 1

## 2016-12-02 NOTE — Plan of Care (Signed)
Problem: Respiratory: Goal: Levels of oxygenation will improve Outcome: Progressing Patient weaned to room air. Sats maintaining >91%.   Problem: Pain Management: Goal: Expressions of feelings of enhanced comfort will increase Outcome: Progressing Patient states she feels a little better today than yesterday.   Problem: Activity: Goal: Ability to tolerate increased activity will improve Outcome: Progressing Patient OOB up to recliner.

## 2016-12-02 NOTE — Progress Notes (Signed)
Pt requesting something to help with sleep. MD notified. Orders placed. Will continue to monitor and assess.

## 2016-12-02 NOTE — Progress Notes (Signed)
[place] at [place] NAME: Toni [name]    MR#:  [id number]  DATE OF BIRTH:  1935-05-01  SUBJECTIVE:  CHIEF COMPLAINT:   Chief Complaint  Patient presents with  . Shortness of Breath   Feels some better today. Afebrile Sitting in a chair  REVIEW OF SYSTEMS:    Review of Systems  Constitutional: Positive for malaise/fatigue. Negative for chills and fever.  HENT: Negative for sore throat.   Eyes: Negative for blurred vision, double vision and pain.  Respiratory: Positive for cough, shortness of breath and wheezing. Negative for hemoptysis.   Cardiovascular: Negative for chest pain, palpitations, orthopnea and leg swelling.  Gastrointestinal: Negative for abdominal pain, constipation, diarrhea, heartburn, nausea and vomiting.  Genitourinary: Negative for dysuria and hematuria.  Musculoskeletal: Negative for back pain and joint pain.  Skin: Negative for rash.  Neurological: Positive for weakness. Negative for sensory change, speech change, focal weakness and headaches.  Endo/Heme/Allergies: Does not bruise/bleed easily.  Psychiatric/Behavioral: Negative for depression. The patient is not nervous/anxious.     DRUG ALLERGIES:   Allergies  Allergen Reactions  . Belladonna Alkaloids Anaphylaxis and Other (See Comments)    Pt states that this medication makes her eyes cross.    . Dabigatran Other (See Comments)    VITALS:  Blood pressure 127/89, pulse 81, temperature 97.7 F (36.5 C), temperature source Oral, resp. rate 18, height '5\' 6"'$  (1.676 m), weight 83.3 kg (183 lb 10.3 oz), SpO2 94 %.  PHYSICAL EXAMINATION:   Physical Exam  GENERAL:  82 y.o.-year-old patient lying in the bed with no acute distress.  EYES: Pupils equal, round, reactive to light and accommodation. No scleral icterus. Extraocular muscles intact.  HEENT: Head atraumatic, normocephalic. Oropharynx and nasopharynx clear.  NECK:  Supple, no jugular venous  distention. No thyroid enlargement, no tenderness.  LUNGS: Increased work of breathing with bilateral wheezing. Decreased air entry. CARDIOVASCULAR: S1, S2 normal. No murmurs, rubs, or gallops.  ABDOMEN: Soft, nontender, nondistended. Bowel sounds present. No organomegaly or mass.  EXTREMITIES: No cyanosis, clubbing or edema b/l.    NEUROLOGIC: Cranial nerves II through XII are intact. No focal Motor or sensory deficits b/l.   PSYCHIATRIC: The patient is alert and oriented x 3.  SKIN: No obvious rash, lesion, or ulcer.   LABORATORY PANEL:   CBC  Recent Labs Lab 11/30/16 0315  WBC 7.5  HGB 10.2*  HCT 30.8*  PLT 175   ------------------------------------------------------------------------------------------------------------------ Chemistries   Recent Labs Lab 11/29/16 1347 11/30/16 0315  NA 141 139  K 4.1 3.9  CL 95* 96*  CO2 33* 34*  GLUCOSE 117* 114*  BUN 45* 49*  CREATININE 1.15* 1.12*  CALCIUM 8.6* 8.4*  MG 2.0  --   AST 27  --   ALT 17  --   ALKPHOS 65  --   BILITOT 1.1  --    ------------------------------------------------------------------------------------------------------------------  Cardiac Enzymes  Recent Labs Lab 11/29/16 1347  TROPONINI 0.05*   ------------------------------------------------------------------------------------------------------------------  RADIOLOGY:  Dg Chest 2 View  Result Date: 12/01/2016 CLINICAL DATA:  Hypoxia EXAM: CHEST  2 VIEW COMPARISON:  11/29/2016 FINDINGS: Cardiac shadow is mildly enlarged. A hiatal hernia is noted. The lungs are well aerated bilaterally. Some new patchy left upper lobe infiltrate is seen. No sizable effusion is seen. No bony abnormality is noted. IMPRESSION: New patchy left upper lobe pneumonia. Electronically Signed   By: Inez Catalina M.D.   On: 12/01/2016 12:14   ASSESSMENT AND PLAN:   *  Influenza B with LUL infiltrate On Tamiflu. Levaquin. Isolation. Flutter valve  * Acute COPD  exacerbation with acute hypoxic respiratory failure Due to influenza B -IV steroids, Antibiotics - Scheduled Nebulizers - Inhalers -Wean O2 as tolerated  * Atrial fibrillation with rapid ventricular rate In and out of Afib . Rate controlled  * Acute on chronic diastolic CHF, mild Improved. She received one dose IV Lasix on admission. Continue torsemide from home. Repeat CXR showed no edema  * Hypertension Continue medications  * DVT prophylaxis with Lovenox  All the records are reviewed and case discussed with Care Management/Social Workerr. Management plans discussed with the patient, family and they are in agreement.  CODE STATUS: FULL CODE  DVT Prophylaxis: SCDs  TOTAL TIME TAKING CARE OF THIS PATIENT: 35 minutes  POSSIBLE D/C IN 2-3 DAYS, DEPENDING ON CLINICAL CONDITION.  Hillary Bow R M.D on 12/02/2016 at 10:46 AM  Between 7am to 6pm - Pager - [phone]  After 6pm go to [email or web address] - password [email or web address] [facility]  Office  [phone]  CC: Primary care physician; Pcp Not In System  Note: This dictation was prepared with Dragon dictation along with smaller phrase technology. Any transcriptional errors that result from this process are unintentional.

## 2016-12-02 NOTE — Evaluation (Signed)
Physical Therapy Evaluation Patient Details Name: Toni [name] MRN: [id number] DOB: 04-Mar-1935 Today's Date: 12/02/2016   History of Present Illness  Toni [name] is a 82 y.o. female with a known history of chronic diastolic CHF, COPD, hypertension, hypothyroidism, CKD stage II, paroxysmal atrial fib/a.flutter comes to [facility] on 3/14 due to shortness of breath.  Pt noted to be in afib with RVR in ED. Pt recently DC from this facility for similar issue 1WA. Pt on droplet precautions for Flu.   Clinical Impression  Pt admitted with above diagnosis. Pt currently with functional limitations due to the deficits listed below (see "PT Problem List"). Upon entry, the patient is received seated in geri-chair asleep, no family/caregiver present.   Once awakened, the patient is reluctantly agreeable to participate. No acute distress noted at this time, however upon standing pt reports major limitations from new low back pain, also limited by DOE on RA. The pt is alert and oriented x3, pleasant, conversational, and following simple and multi-step commands consistently. Pt received on and remaining on 2L O2 throughout evaluation, with noted saturation of >89%, whereas pt is not on O2 at baseline at home. Functional mobility assessment demonstrates mild-moderate weakness, the pt now requiring additional time to perform gait, poor tolerance to >3 minutes activity, and limited postural control of trunk. The patient is at high risk for falls as evidence by gait speed <1.55ms and forward reach <5". Pt will benefit from skilled PT intervention to increase independence and safety with basic mobility in preparation for discharge to the venue listed below.       Follow Up Recommendations Home health PT    Equipment Recommendations  None recommended by PT    Recommendations for Other Services       Precautions / Restrictions Precautions Precautions: Fall Restrictions Weight Bearing Restrictions: No       Mobility  Bed Mobility               General bed mobility comments: Received upright in recliner and left in recliner. Bed mobility not assessed on this date  Transfers Overall transfer level: Modified independent Equipment used: None Transfers: Sit to/from Stand Sit to Stand: Modified independent (Device/Increase time)         General transfer comment: Ptr remains forward flexed with significant postural limitations. Once upright she is relatively stable but does reach for bed, railing in hallway, and counter at nurse station.   Ambulation/Gait Ambulation/Gait assistance: Supervision Ambulation Distance (Feet): 75 Feet Assistive device: None   Gait velocity: Decreased   General Gait Details: labored respiration by arrival at nurse station, mil-mod anxiety trying to breath with droplet mask over face. Pt returned to room, reports worsenign DOE. 84-87% upone return to sitting, moved to 2l/min, but with congestion poor nasal inhalation capability.   Stairs            Wheelchair Mobility    Modified Rankin (Stroke Patients Only)       Balance           Standing balance support: No upper extremity supported;During functional activity Standing balance-Leahy Scale: Good                               Pertinent Vitals/Pain Pain Assessment: No/denies pain Faces Pain Scale: Hurts even more Pain Location: low back with AMB/standing  Pain Descriptors / Indicators: Aching;Dull Pain Intervention(s): Limited activity within patient's tolerance;Monitored during session;Repositioned    Home  Living Family/patient expects to be discharged to:: Private residence Living Arrangements: Spouse/significant other Available Help at Discharge: Family Type of Home: House Home Access: Stairs to enter Entrance Stairs-Rails: Right Entrance Stairs-Number of Steps: 2 Home Layout: One level;Laundry or work area in [place]: Shower seat;Bedside  commode;Grab bars - tub/shower;Walker - 4 wheels      Prior Function Level of Independence: Independent         Comments: Indep with ADLs, household and community mobility without assistive device; 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  Denies fall history; home O2 at night at 2L/min     Hand Dominance   Dominant Hand: Right    Extremity/Trunk Assessment   Upper Extremity Assessment Upper Extremity Assessment: Generalized weakness    Lower Extremity Assessment Lower Extremity Assessment: Generalized weakness    Cervical / Trunk Assessment Cervical / Trunk Assessment:  (forward flexed, kyphotic)  Communication   Communication: HOH  Cognition Arousal/Alertness: Lethargic Behavior During Therapy: WFL for tasks assessed/performed Overall Cognitive Status: Within Functional Limits for tasks assessed                      General Comments      Exercises     Assessment/Plan    PT Assessment Patient needs continued PT services  PT Problem List Decreased activity tolerance;Decreased balance;Decreased mobility;Cardiopulmonary status limiting activity;Decreased strength       PT Treatment Interventions DME instruction;Gait training;Stair training;Functional mobility training;Therapeutic activities;Therapeutic exercise;Balance training;Patient/family education;Neuromuscular re-education    PT Goals (Current goals can be found in the Care Plan section)  Acute Rehab PT Goals Patient Stated Goal: return to home, reduce low back pain PT Goal Formulation: With patient Time For Goal Achievement: 12/02/16 Potential to Achieve Goals: Good    Frequency Min 2X/week   Barriers to discharge        Co-evaluation               End of Session Equipment Utilized During Treatment: Gait belt;Oxygen Activity Tolerance: Patient limited by fatigue;Treatment limited secondary to medical complications (Comment) Patient left: in chair;with call bell/phone within  reach;with chair alarm set Nurse Communication: Mobility status PT Visit Diagnosis: Unsteadiness on feet (R26.81);Muscle weakness (generalized) (M62.81)         Time: 2725-3664 PT Time Calculation (min) (ACUTE ONLY): 16 min   Charges:   PT Evaluation $PT Eval Low Complexity: 1 Procedure PT Treatments $Therapeutic Exercise: 8-22 mins      11:10 AM, 12/02/16 Etta Grandchild, PT, DPT Physical Therapist - [place] [phone] (mobile)

## 2016-12-03 LAB — BASIC METABOLIC PANEL
ANION GAP: 11 (ref 5–15)
BUN: 73 mg/dL — ABNORMAL HIGH (ref 6–20)
CO2: 31 mmol/L (ref 22–32)
Calcium: 8.2 mg/dL — ABNORMAL LOW (ref 8.9–10.3)
Chloride: 97 mmol/L — ABNORMAL LOW (ref 101–111)
Creatinine, Ser: 1.54 mg/dL — ABNORMAL HIGH (ref 0.44–1.00)
GFR, EST AFRICAN AMERICAN: 36 mL/min — AB (ref >60)
GFR, EST NON AFRICAN AMERICAN: 31 mL/min — AB (ref >60)
Glucose, Bld: 148 mg/dL — ABNORMAL HIGH (ref 65–99)
Potassium: 3.2 mmol/L — ABNORMAL LOW (ref 3.5–5.1)
SODIUM: 139 mmol/L (ref 135–145)

## 2016-12-03 MED ORDER — LEVOFLOXACIN 250 MG PO TABS
250.0000 mg | ORAL_TABLET | Freq: Every day | ORAL | Status: DC
  Administered 2016-12-03 – 2016-12-05 (×3): 250 mg via ORAL
  Filled 2016-12-03 (×3): qty 1

## 2016-12-03 MED ORDER — POTASSIUM CHLORIDE CRYS ER 20 MEQ PO TBCR
40.0000 meq | EXTENDED_RELEASE_TABLET | Freq: Once | ORAL | Status: AC
  Administered 2016-12-03: 40 meq via ORAL
  Filled 2016-12-03: qty 2

## 2016-12-03 NOTE — Progress Notes (Signed)
PHARMACY NOTE:  ANTIMICROBIAL RENAL DOSAGE ADJUSTMENT  Current antimicrobial regimen includes a mismatch between antimicrobial dosage and estimated renal function.  As per policy approved by the Pharmacy & Therapeutics and Medical Executive Committees, the antimicrobial dosage will be adjusted accordingly.  Current antimicrobial dosage:  Levaquin '500mg'$  IV q24h  Indication: AECOPD  Renal Function:  Estimated Creatinine Clearance: 32.5 mL/min (A) (by C-G formula based on SCr of 1.54 mg/dL (H)).     Antimicrobial dosage has been changed to:  Levaquin 250 mg q24h  Additional comments:   Thank you for allowing pharmacy to be a part of this patient's care.  Ardith Lewman A, RPH 12/03/2016 1:32 PM

## 2016-12-03 NOTE — Progress Notes (Signed)
[place] at [place] NAME: Toni [name]    MR#:  [id number]  DATE OF BIRTH:  [birth year]  SUBJECTIVE:  CHIEF COMPLAINT:   Chief Complaint  Patient presents with  . Shortness of Breath   On 2 L oxygen. Patient has when necessary oxygen use at home. Still feels weak. Laying in bed. Poor appetite.  REVIEW OF SYSTEMS:    Review of Systems  Constitutional: Positive for malaise/fatigue. Negative for chills and fever.  HENT: Negative for sore throat.   Eyes: Negative for blurred vision, double vision and pain.  Respiratory: Positive for cough, shortness of breath and wheezing. Negative for hemoptysis.   Cardiovascular: Negative for chest pain, palpitations, orthopnea and leg swelling.  Gastrointestinal: Negative for abdominal pain, constipation, diarrhea, heartburn, nausea and vomiting.  Genitourinary: Negative for dysuria and hematuria.  Musculoskeletal: Negative for back pain and joint pain.  Skin: Negative for rash.  Neurological: Positive for weakness. Negative for sensory change, speech change, focal weakness and headaches.  Endo/Heme/Allergies: Does not bruise/bleed easily.  Psychiatric/Behavioral: Negative for depression. The patient is not nervous/anxious.     DRUG ALLERGIES:   Allergies  Allergen Reactions  . Belladonna Alkaloids Anaphylaxis and Other (See Comments)    Pt states that this medication makes her eyes cross.    . Dabigatran Other (See Comments)    VITALS:  Blood pressure (!) 121/47, pulse 86, temperature 97.9 F (36.6 C), temperature source Oral, resp. rate (!) 22, height '5\' 6"'$  (1.676 m), weight 84.6 kg (186 lb 9.6 oz), SpO2 100 %.  PHYSICAL EXAMINATION:   Physical Exam  GENERAL:  82 y.o.-year-old patient lying in the bed with no acute distress.  EYES: Pupils equal, round, reactive to light and accommodation. No scleral icterus. Extraocular muscles intact.  HEENT: Head atraumatic, normocephalic. Oropharynx and  nasopharynx clear.  NECK:  Supple, no jugular venous distention. No thyroid enlargement, no tenderness.  LUNGS: Increased work of breathing with bilateral wheezing. Decreased air entry. CARDIOVASCULAR: S1, S2 normal. No murmurs, rubs, or gallops.  ABDOMEN: Soft, nontender, nondistended. Bowel sounds present. No organomegaly or mass.  EXTREMITIES: No cyanosis, clubbing or edema b/l.    NEUROLOGIC: Cranial nerves II through XII are intact. No focal Motor or sensory deficits b/l.   PSYCHIATRIC: The patient is alert and oriented x 3.  SKIN: No obvious rash, lesion, or ulcer.   LABORATORY PANEL:   CBC  Recent Labs Lab 11/30/16 0315  WBC 7.5  HGB 10.2*  HCT 30.8*  PLT 175   ------------------------------------------------------------------------------------------------------------------ Chemistries   Recent Labs Lab 11/29/16 1347  12/03/16 0539  NA 141  < > 139  K 4.1  < > 3.2*  CL 95*  < > 97*  CO2 33*  < > 31  GLUCOSE 117*  < > 148*  BUN 45*  < > 73*  CREATININE 1.15*  < > 1.54*  CALCIUM 8.6*  < > 8.2*  MG 2.0  --   --   AST 27  --   --   ALT 17  --   --   ALKPHOS 65  --   --   BILITOT 1.1  --   --   < > = values in this interval not displayed. ------------------------------------------------------------------------------------------------------------------  Cardiac Enzymes  Recent Labs Lab 11/29/16 1347  TROPONINI 0.05*   ------------------------------------------------------------------------------------------------------------------  RADIOLOGY:  No results found. ASSESSMENT AND PLAN:   * Influenza B with LUL infiltrate On Tamiflu. Levaquin. Isolation.  * Acute COPD  exacerbation with acute hypoxic respiratory failure Due to influenza B -IV steroids, Antibiotics - Scheduled Nebulizers - Inhalers -Wean O2 as tolerated  * Atrial fibrillation with rapid ventricular rate In and out of Afib . Rate controlled  * Acute on chronic diastolic CHF,  mild Improved. She received one dose IV Lasix on admission. Continue torsemide from home. Repeat CXR showed no edema  * Hypertension Continue medications  * DVT prophylaxis with Lovenox  All the records are reviewed and case discussed with Care Management/Social Worker Management plans discussed with the patient, family and they are in agreement.  CODE STATUS: FULL CODE  DVT Prophylaxis: SCDs  TOTAL TIME TAKING CARE OF THIS PATIENT: 35 minutes  Possible discharge tomorrow with home oxygen, Tamiflu, prednisone  Hillary Bow R M.D on 12/03/2016 at 12:27 PM  Between 7am to 6pm - Pager - [phone]  After 6pm go to [email or web address] - password [email or web address] [facility]  Office  [phone]  CC: Primary care physician; Pcp Not In System  Note: This dictation was prepared with Dragon dictation along with smaller phrase technology. Any transcriptional errors that result from this process are unintentional.

## 2016-12-03 NOTE — Progress Notes (Signed)
PHARMACIST - PHYSICIAN COMMUNICATION  CONCERNING: Antibiotic IV to Oral Route Change Policy  RECOMMENDATION: This patient is receiving LEVAQUIN by the intravenous route.  Based on criteria approved by the Pharmacy and Therapeutics Committee, the antibiotic(s) is/are being converted to the equivalent oral dose form(s).   DESCRIPTION: These criteria include:  Patient being treated for a respiratory tract infection, urinary tract infection, cellulitis or clostridium difficile associated diarrhea if on metronidazole  The patient is not neutropenic and does not exhibit a GI malabsorption state  The patient is eating (either orally or via tube) and/or has been taking other orally administered medications for a least 24 hours  The patient is improving clinically and has a Tmax < 100.5  If you have questions about this conversion, please contact the Pharmacy Department  '[]'$   [phone] )  [facility] '[x]'$   [phone] )  [facility] '[]'$   [phone] )  [facility] '[]'$   [phone] )  [facility] '[]'$   [phone] )  [place] PharmD Clinical Pharmacist 12/03/2016

## 2016-12-03 NOTE — Progress Notes (Signed)
Pt requesting medicine for pain in legs. Upon arrival to room pt. Sleep. Will continue to monitor and assess.

## 2016-12-03 NOTE — Plan of Care (Signed)
Problem: Activity: Goal: Risk for activity intolerance will decrease Outcome: Progressing She short distances in the room, primarily in bed or recliner.  Needs further tx for pneumonia.

## 2016-12-04 ENCOUNTER — Inpatient Hospital Stay: Payer: Medicare Other

## 2016-12-04 DIAGNOSIS — I5033 Acute on chronic diastolic (congestive) heart failure: Secondary | ICD-10-CM

## 2016-12-04 DIAGNOSIS — J441 Chronic obstructive pulmonary disease with (acute) exacerbation: Secondary | ICD-10-CM

## 2016-12-04 LAB — CULTURE, BLOOD (ROUTINE X 2)
CULTURE: NO GROWTH
Culture: NO GROWTH

## 2016-12-04 LAB — GLUCOSE, CAPILLARY
GLUCOSE-CAPILLARY: 102 mg/dL — AB (ref 65–99)
Glucose-Capillary: 138 mg/dL — ABNORMAL HIGH (ref 65–99)

## 2016-12-04 NOTE — Care Management (Signed)
Contacted by [facility] to discuss home trilogy for patient.  She may benefit due to the acute on chronic respiratory failure due to her copd.  she is at high risk for readmission.

## 2016-12-04 NOTE — Consult Note (Signed)
Name: Toni [name] MRN: [id number] DOB: 07-24-1935    ADMISSION DATE:  11/29/2016 CONSULTATION DATE:  12/04/2016  REFERRING MD :  Dr. Manuella Ghazi  CHIEF COMPLAINT:  Shortness of Breath   BRIEF PATIENT DESCRIPTION:  82 yo female with acute on chronic hypoxic respiratory failure secondary to AECOPD, LUL Pneumonia, and Influenza B and Afibb with RVR  SIGNIFICANT EVENTS  03/14-Pt admitted to telemetry unit for treatment of acute on chronic hypoxic respiratory failure and afibb with RVR 03/19-PCCM consulted for additional management of acute on chronic hypoxic respiratory failure secondary to AECOPD, LUL Pneumonia, and Influenza B  STUDIES:  None  HISTORY OF PRESENT ILLNESS:   This is a 83 yo female with a PMH of Paroxysmal afibb, Lung nodule, Alzheimer's Disease, Stage 1 SQ Cell Cancer (dx in 2015 s/p CT guided biopsy s/p RXT), CKD stage II, Hypothyroidism, HTN, COPD (on chronic home O2), Collagen Vascular Disease, Chronic diastolic CHF, and Atypical Aflutter. She presented to [facility] ER 03/14 with acute onset of shortness of breath.  She took multiple breathing treatments at home without improvement of symptoms, therefore EMS was notified. Upon EMS arrival she received additional breathing treatments with mild improvement of respiratory status, however she continued to have significant wheezing.  In the ER she was noted to be in afibb with RVR requiring a Cardizem gtt and she was positive for Influenza B.  She was recently discharged from the hospital on 11/23/16 following treatment of a COPD exacerbation.  Her outpatient pulmonologist is Dr. Mortimer Fries.  She was subsequently admitted to the telemetry unit with acute on chronic hypoxic respiratory failure secondary to AECOPD, LUL Pneumonia, and Influenza B and Afibb with RVR requiring a Cardizem gtt.  PCCM consulted for additional management of acute on chronic respiratory failure.   PAST MEDICAL HISTORY :   has a past medical history of Atypical atrial  flutter ([place]) (11/16/2013); Cataract; Chronic diastolic CHF (congestive heart failure) ([place]); Chronic diastolic heart failure ([place]); Collagen vascular disease ([place]); COPD (chronic obstructive pulmonary disease) ([place]); Essential hypertension, benign; Hypokalemia; Hypothyroidism; Kidney disease, chronic, stage II (mild, EGFR 60+ ml/min); Lung cancer ([place]); Lung nodule; and PAF (paroxysmal atrial fibrillation) ([place]).  has a past surgical history that includes Eye surgery; Total knee arthroplasty (Bilateral); Appendectomy; and Right Heart Cath (N/A, 11/17/2016). Prior to Admission medications   Medication Sig Start Date End Date Taking? Authorizing Provider  albuterol (PROVENTIL HFA) 108 (90 Base) MCG/ACT inhaler Inhale 2 puffs into the lungs every 4 (four) hours as needed for wheezing or shortness of breath. 07/23/16  Yes Carrie Mew, MD  ALPRAZolam Duanne Moron) 0.25 MG tablet Take 1 tablet (0.25 mg total) by mouth at bedtime as needed for anxiety. 11/19/16  Yes Epifanio Lesches, MD  budesonide (PULMICORT) 0.5 MG/2ML nebulizer solution Take 2 mLs (0.5 mg total) by nebulization 2 (two) times daily. 06/08/15  Yes Flora Lipps, MD  cyanocobalamin (,VITAMIN B-12,) 1000 MCG/ML injection Vit B12 1000 mcg IM once a week for 4 weeks then once a month for 4 months 10/09/16  Yes Historical Provider, MD  diltiazem (CARDIZEM CD) 300 MG 24 hr capsule Take 300 mg by mouth daily.   Yes Historical Provider, MD  formoterol (PERFOROMIST) 20 MCG/2ML nebulizer solution Take 2 mLs (20 mcg total) by nebulization 2 (two) times daily. 06/08/15  Yes Flora Lipps, MD  ipratropium-albuterol (DUONEB) 0.5-2.5 (3) MG/3ML SOLN Take 3 mLs by nebulization every 4 (four) hours as needed (for wheezing/shortness of breath). DX Code: J44.9   DX: COPD 08/29/16  Yes Wilhelmina Mcardle, MD  levothyroxine (SYNTHROID, LEVOTHROID) 75 MCG tablet Take 75 mcg by mouth daily before breakfast.   Yes Historical Provider, MD  loratadine (CLARITIN) 10 MG tablet Take 10  mg by mouth daily.   Yes Historical Provider, MD  nystatin (MYCOSTATIN) 100000 UNIT/ML suspension Take 5 mLs (500,000 Units total) by mouth 4 (four) times daily. 11/19/16  Yes Epifanio Lesches, MD  omega-3 acid ethyl esters (LOVAZA) 1 g capsule Take 1 g by mouth daily.   Yes Historical Provider, MD  omeprazole (PRILOSEC) 20 MG capsule Take 20 mg by mouth 2 (two) times daily before a meal.   Yes Historical Provider, MD  potassium chloride (K-DUR,KLOR-CON) 10 MEQ tablet Take 2 tablets (20 mEq total) by mouth 2 (two) times daily. 08/03/16  Yes Minna Merritts, MD  torsemide (DEMADEX) 20 MG tablet Take 2 tablets (40 mg total) by mouth 2 (two) times daily. 11/19/16  Yes Epifanio Lesches, MD  predniSONE (STERAPRED UNI-PAK 21 TAB) 10 MG (21) TBPK tablet Taper by 10 mg daily Patient not taking: Reported on 11/29/2016 11/19/16   Epifanio Lesches, MD   Allergies  Allergen Reactions  . Belladonna Alkaloids Anaphylaxis and Other (See Comments)    Pt states that this medication makes her eyes cross.    . Dabigatran Other (See Comments)    FAMILY HISTORY:  family history includes Arrhythmia in her sister; CAD in her father; Heart attack in her father; Heart disease in her father. SOCIAL HISTORY:  reports that she quit smoking about 14 years ago. Her smoking use included Cigarettes. She has a 75.00 pack-year smoking history. She has never used smokeless tobacco. She reports that she does not drink alcohol or use drugs.  REVIEW OF SYSTEMS:  Positives in BOLD Constitutional: Negative for fever, chills, weight loss, malaise/fatigue and diaphoresis.  HENT: Negative for hearing loss, ear pain, nosebleeds, congestion, sore throat, neck pain, tinnitus and ear discharge.   Eyes: Negative for blurred vision, double vision, photophobia, pain, discharge and redness.  Respiratory: cough, hemoptysis, sputum production, shortness of breath, wheezing and stridor.   Cardiovascular: chest pain, palpitations, orthopnea,  claudication, leg swelling and PND.  Gastrointestinal: Negative for heartburn, nausea, vomiting, abdominal pain, diarrhea, constipation, blood in stool and melena.  Genitourinary: Negative for dysuria, urgency, frequency, hematuria and flank pain.  Musculoskeletal: Negative for myalgias, back pain, joint pain and falls.  Skin: Negative for itching and rash.  Neurological: Negative for dizziness, tingling, tremors, sensory change, speech change, focal weakness, seizures, loss of consciousness, weakness and headaches.  Endo/Heme/Allergies: Negative for environmental allergies and polydipsia. Does not bruise/bleed easily.  SUBJECTIVE:  Pt states her breathing has improved, however she continues to have shortness of breath at rest  VITAL SIGNS: Temp:  [97.7 F (36.5 C)-98.3 F (36.8 C)] 97.7 F (36.5 C) (03/19 1939) Pulse Rate:  [78-92] 78 (03/19 1939) Resp:  [16-26] 16 (03/19 1939) BP: (121-146)/(62-75) 146/72 (03/19 1939) SpO2:  [95 %-99 %] 97 % (03/19 2009) Weight:  [84.7 kg (186 lb 12.8 oz)] 84.7 kg (186 lb 12.8 oz) (03/19 0433)  PHYSICAL EXAMINATION: General: acutely ill appearing Caucasian female  Neuro: alert and oriented, follows commands, PERRLA HEENT: supple, no JVD Cardiovascular: irregular irregular, rate controlled, no M/R/G Lungs: rhonchi throughout, even, slightly labored on 2L O2 via nasal canula   Abdomen: +BS x4, soft, obese, non tender, non distended  Musculoskeletal: normal tone, trace edema bilateral lower extremities, moves all extremities  Skin: scattered ecchymosis, no lesions or rashes  Recent Labs Lab 11/29/16 1347 11/30/16 0315 12/03/16 0539  NA 141 139 139  K 4.1 3.9 3.2*  CL 95* 96* 97*  CO2 33* 34* 31  BUN 45* 49* 73*  CREATININE 1.15* 1.12* 1.54*  GLUCOSE 117* 114* 148*    Recent Labs Lab 11/29/16 1347 11/30/16 0315  HGB 11.1* 10.2*  HCT 33.7* 30.8*  WBC 8.3 7.5  PLT 166 175   Dg Chest 2 View  Result Date: 12/04/2016 CLINICAL  DATA:  Acute onset of shortness of breath and dyspnea. Initial encounter. EXAM: CHEST  2 VIEW COMPARISON:  Chest radiograph performed 12/01/2016, and CTA of the chest performed 11/22/2016 FINDINGS: The lungs are well-aerated. Scattered bilateral parenchymal scarring is noted. The left upper lobe pneumonia has improved. The underlying left lower lobe pulmonary nodule is not well characterized on radiograph. No pleural effusion or pneumothorax is seen. The heart is mildly enlarged. A moderate hiatal hernia is again noted. No acute osseous abnormalities are seen. IMPRESSION: 1. Interval improvement in left upper lobe pneumonia. 2. Scattered bilateral parenchymal scarring is noted. Underlying left lower lobe pulmonary nodule is not well characterized on radiograph, though it appears stable on recent CTA. 3. Mild cardiomegaly. 4. Moderate hiatal hernia. Electronically Signed   By: Garald Balding M.D.   On: 12/04/2016 19:52    ASSESSMENT / PLAN: Acute on chronic hypoxic respiratory failure secondary to AECOPD, LUL Pneumonia, and Influenza B Hx: Stage 1 SQ Cell Lung [place] and Chronic diastolic CHF  P: Supplemental O2 to maintain O2 sats 88% to 92% Intermittent CXR  Continue levaquin  Continue scheduled and prn bronchodilator therapy Continue iv steroids wean once respiratory status improved  Continue tamiflu Continue outpatient torsemide   Marda Stalker, [place] Pager [phone] (please enter 7 digits) PCCM Consult Pager [phone] (please enter 7 digits)

## 2016-12-04 NOTE — Care Management Important Message (Signed)
Important Message  Patient Details  Name: Toni [name] MRN: [id number] Date of Birth: June 14, 1935   Medicare Important Message Given:  Yes    Katrina Stack, RN 12/04/2016, 8:07 AM

## 2016-12-04 NOTE — Progress Notes (Signed)
[place] at [place] NAME: Toni [name]    MR#:  [id number]  DATE OF BIRTH:  09/11/1935  SUBJECTIVE:  CHIEF COMPLAINT:   Chief Complaint  Patient presents with  . Shortness of Breath  struggling to breath, sitting in chair but using accessory muscles of respi. Poor appetite. REVIEW OF SYSTEMS:    Review of Systems  Constitutional: Positive for malaise/fatigue. Negative for chills and fever.  HENT: Negative for sore throat.   Eyes: Negative for blurred vision, double vision and pain.  Respiratory: Positive for cough, shortness of breath and wheezing. Negative for hemoptysis.   Cardiovascular: Negative for chest pain, palpitations, orthopnea and leg swelling.  Gastrointestinal: Negative for abdominal pain, constipation, diarrhea, heartburn, nausea and vomiting.  Genitourinary: Negative for dysuria and hematuria.  Musculoskeletal: Negative for back pain and joint pain.  Skin: Negative for rash.  Neurological: Positive for weakness. Negative for sensory change, speech change, focal weakness and headaches.  Endo/Heme/Allergies: Does not bruise/bleed easily.  Psychiatric/Behavioral: Negative for depression. The patient is not nervous/anxious.    DRUG ALLERGIES:   Allergies  Allergen Reactions  . Belladonna Alkaloids Anaphylaxis and Other (See Comments)    Pt states that this medication makes her eyes cross.    . Dabigatran Other (See Comments)    VITALS:  Blood pressure 129/62, pulse 92, temperature 98 F (36.7 C), temperature source Oral, resp. rate (!) 26, height '5\' 6"'$  (1.676 m), weight 84.7 kg (186 lb 12.8 oz), SpO2 98 %.  PHYSICAL EXAMINATION:   Physical Exam  GENERAL:  82 y.o.-year-old patient lying in the bed with no acute distress.  EYES: Pupils equal, round, reactive to light and accommodation. No scleral icterus. Extraocular muscles intact.  HEENT: Head atraumatic, normocephalic. Oropharynx and nasopharynx clear.  NECK:   Supple, no jugular venous distention. No thyroid enlargement, no tenderness.  LUNGS: Increased work of breathing with bilateral wheezing. Decreased air entry. Wheezing + CARDIOVASCULAR: S1, S2 normal. No murmurs, rubs, or gallops.  ABDOMEN: Soft, nontender, nondistended. Bowel sounds present. No organomegaly or mass.  EXTREMITIES: No cyanosis, clubbing or edema b/l.    NEUROLOGIC: Cranial nerves II through XII are intact. No focal Motor or sensory deficits b/l.   PSYCHIATRIC: The patient is alert and oriented x 3.  SKIN: No obvious rash, lesion, or ulcer.   LABORATORY PANEL:   CBC  Recent Labs Lab 11/30/16 0315  WBC 7.5  HGB 10.2*  HCT 30.8*  PLT 175   ------------------------------------------------------------------------------------------------------------------ Chemistries   Recent Labs Lab 11/29/16 1347  12/03/16 0539  NA 141  < > 139  K 4.1  < > 3.2*  CL 95*  < > 97*  CO2 33*  < > 31  GLUCOSE 117*  < > 148*  BUN 45*  < > 73*  CREATININE 1.15*  < > 1.54*  CALCIUM 8.6*  < > 8.2*  MG 2.0  --   --   AST 27  --   --   ALT 17  --   --   ALKPHOS 65  --   --   BILITOT 1.1  --   --   < > = values in this interval not displayed. ------------------------------------------------------------------------------------------------------------------  Cardiac Enzymes  Recent Labs Lab 11/29/16 1347  TROPONINI 0.05*   ------------------------------------------------------------------------------------------------------------------  RADIOLOGY:  No results found. ASSESSMENT AND PLAN:   * Influenza B with LUL infiltrate On Tamiflu. Levaquin. Isolation.  * Acute COPD exacerbation with acute hypoxic respiratory failure Due to  influenza B -IV steroids, Antibiotics - Scheduled Nebulizers - Inhalers -Wean O2 as tolerated - initiate COPD GOLD - high risk for readmission - see if she qualifies for [facility]  * Atrial fibrillation with rapid ventricular rate In and out of Afib .  Rate controlled  * Acute on chronic diastolic CHF, mild Improved. She received one dose IV Lasix on admission. Continue torsemide from home. Repeat CXR showed no edema  * Hypertension Continue medications  * DVT prophylaxis with Lovenox  All the records are reviewed and case discussed with Care Management/Social Worker Management plans discussed with the patient, family and they are in agreement.  CODE STATUS: FULL CODE  DVT Prophylaxis: SCDs  TOTAL TIME TAKING CARE OF THIS PATIENT: 35 minutes  Possible discharge in 1-2 days with home oxygen, Tamiflu, prednisone  And Pulmo eval  Max Sane M.D on 12/04/2016 at 5:23 PM  Between 7am to 6pm - Pager - [phone]  After 6pm go to [email or web address] - password [email or web address] [place] [facility]  Office  [phone]  CC: Primary care physician; Pcp Not In System  Note: This dictation was prepared with Dragon dictation along with smaller phrase technology. Any transcriptional errors that result from this process are unintentional.

## 2016-12-05 DIAGNOSIS — Z515 Encounter for palliative care: Secondary | ICD-10-CM

## 2016-12-05 DIAGNOSIS — Z7189 Other specified counseling: Secondary | ICD-10-CM

## 2016-12-05 DIAGNOSIS — R0609 Other forms of dyspnea: Secondary | ICD-10-CM

## 2016-12-05 DIAGNOSIS — I5033 Acute on chronic diastolic (congestive) heart failure: Secondary | ICD-10-CM

## 2016-12-05 LAB — BASIC METABOLIC PANEL
Anion gap: 11 (ref 5–15)
BUN: 84 mg/dL — AB (ref 6–20)
CALCIUM: 8.4 mg/dL — AB (ref 8.9–10.3)
CHLORIDE: 95 mmol/L — AB (ref 101–111)
CO2: 29 mmol/L (ref 22–32)
CREATININE: 1.64 mg/dL — AB (ref 0.44–1.00)
GFR calc Af Amer: 33 mL/min — ABNORMAL LOW (ref >60)
GFR calc non Af Amer: 29 mL/min — ABNORMAL LOW (ref >60)
Glucose, Bld: 115 mg/dL — ABNORMAL HIGH (ref 65–99)
Potassium: 3.8 mmol/L (ref 3.5–5.1)
SODIUM: 135 mmol/L (ref 135–145)

## 2016-12-05 LAB — BLOOD GAS, VENOUS
ACID-BASE EXCESS: 12.7 mmol/L — AB (ref 0.0–2.0)
BICARBONATE: 39.2 mmol/L — AB (ref 20.0–28.0)
O2 SAT: 79 %
PATIENT TEMPERATURE: 37
PO2 VEN: 42 mmHg (ref 32.0–45.0)
pCO2, Ven: 59 mmHg (ref 44.0–60.0)
pH, Ven: 7.43 (ref 7.250–7.430)

## 2016-12-05 LAB — CBC
HCT: 33.6 % — ABNORMAL LOW (ref 35.0–47.0)
HEMOGLOBIN: 11.3 g/dL — AB (ref 12.0–16.0)
MCH: 28.4 pg (ref 26.0–34.0)
MCHC: 33.7 g/dL (ref 32.0–36.0)
MCV: 84.4 fL (ref 80.0–100.0)
Platelets: 258 10*3/uL (ref 150–440)
RBC: 3.98 MIL/uL (ref 3.80–5.20)
RDW: 17.1 % — AB (ref 11.5–14.5)
WBC: 9 10*3/uL (ref 3.6–11.0)

## 2016-12-05 MED ORDER — METHYLPREDNISOLONE SODIUM SUCC 125 MG IJ SOLR
60.0000 mg | Freq: Four times a day (QID) | INTRAMUSCULAR | Status: DC
  Administered 2016-12-05 – 2016-12-06 (×4): 60 mg via INTRAVENOUS
  Filled 2016-12-05 (×4): qty 2

## 2016-12-05 MED ORDER — IPRATROPIUM-ALBUTEROL 0.5-2.5 (3) MG/3ML IN SOLN
3.0000 mL | RESPIRATORY_TRACT | Status: DC
  Administered 2016-12-05 – 2016-12-06 (×6): 3 mL via RESPIRATORY_TRACT
  Filled 2016-12-05 (×7): qty 3

## 2016-12-05 MED ORDER — MORPHINE SULFATE (PF) 4 MG/ML IV SOLN: 1.0000 mg | INTRAVENOUS | Status: DC | PRN

## 2016-12-05 MED ORDER — ALPRAZOLAM 0.25 MG PO TABS
0.2500 mg | ORAL_TABLET | Freq: Once | ORAL | Status: AC
  Administered 2016-12-05: 0.25 mg via ORAL
  Filled 2016-12-05: qty 1

## 2016-12-05 MED ORDER — ALUM & MAG HYDROXIDE-SIMETH 200-200-20 MG/5ML PO SUSP
15.0000 mL | ORAL | Status: DC | PRN
  Administered 2016-12-05: 15 mL via ORAL
  Filled 2016-12-05: qty 30

## 2016-12-05 MED ORDER — ALPRAZOLAM 0.25 MG PO TABS
0.2500 mg | ORAL_TABLET | Freq: Two times a day (BID) | ORAL | Status: DC
  Administered 2016-12-05 – 2016-12-06 (×3): 0.25 mg via ORAL
  Filled 2016-12-05 (×3): qty 1

## 2016-12-05 NOTE — Progress Notes (Addendum)
Patient given PRN Xanax at 2302 and is complaining of SOB. Patient is currently on 2L of O2 nasal cannula, SpO2 is WNL, and lung sounds are also WNL. Notified MD Hugelmeyer and one time dose of 0.'25mg'$  of Xanax PO to be given. Nursing staff will continue to monitor for any changes in patient status. Earleen Reaper, RN

## 2016-12-05 NOTE — Consult Note (Signed)
   [facility] CM Inpatient Consult   12/05/2016  Toni [name] 1935-09-12 [id number]    Thank you for this consult. This patient is not eligible for [facility] Care Management Services.  Reason:  Not a beneficiary currently attributed to one of the [place].  Membership roster used to verify non- eligible status. According to chart, inpatient RNCM already aware patient is not eligible for [facility] Care Management program.   [name], MSN-Ed, RN,BSN [facility] Liaison [phone]

## 2016-12-05 NOTE — Progress Notes (Signed)
[place] at [place] NAME: Kirandeep Fariss    MR#:  [id number]  DATE OF BIRTH:  Jun 06, 1935  SUBJECTIVE:  CHIEF COMPLAINT:   Chief Complaint  Patient presents with  . Shortness of Breath  still quite dyspneic, tachypneic, using accessory muscles of respirations.  REVIEW OF SYSTEMS:    Review of Systems  Constitutional: Positive for malaise/fatigue. Negative for chills and fever.  HENT: Negative for sore throat.   Eyes: Negative for blurred vision, double vision and pain.  Respiratory: Positive for cough, shortness of breath and wheezing. Negative for hemoptysis.   Cardiovascular: Negative for chest pain, palpitations, orthopnea and leg swelling.  Gastrointestinal: Negative for abdominal pain, constipation, diarrhea, heartburn, nausea and vomiting.  Genitourinary: Negative for dysuria and hematuria.  Musculoskeletal: Negative for back pain and joint pain.  Skin: Negative for rash.  Neurological: Positive for weakness. Negative for sensory change, speech change, focal weakness and headaches.  Endo/Heme/Allergies: Does not bruise/bleed easily.  Psychiatric/Behavioral: Negative for depression. The patient is not nervous/anxious.    DRUG ALLERGIES:   Allergies  Allergen Reactions  . Belladonna Alkaloids Anaphylaxis and Other (See Comments)    Pt states that this medication makes her eyes cross.    . Dabigatran Other (See Comments)    VITALS:  Blood pressure (!) 137/92, pulse (!) 50, temperature 98 F (36.7 C), temperature source Oral, resp. rate (!) 22, height '5\' 6"'$  (1.676 m), weight 83.9 kg (184 lb 15.5 oz), SpO2 90 %.  PHYSICAL EXAMINATION:   Physical Exam  GENERAL:  82 y.o.-year-old patient lying in the bed with no acute distress.  EYES: Pupils equal, round, reactive to light and accommodation. No scleral icterus. Extraocular muscles intact.  HEENT: Head atraumatic, normocephalic. Oropharynx and nasopharynx clear.  NECK:  Supple, no  jugular venous distention. No thyroid enlargement, no tenderness.  LUNGS: Increased work of breathing with bilateral wheezing. Decreased air entry. Wheezing + CARDIOVASCULAR: S1, S2 normal. No murmurs, rubs, or gallops.  ABDOMEN: Soft, nontender, nondistended. Bowel sounds present. No organomegaly or mass.  EXTREMITIES: No cyanosis, clubbing or edema b/l.    NEUROLOGIC: Cranial nerves II through XII are intact. No focal Motor or sensory deficits b/l.   PSYCHIATRIC: The patient is alert and oriented x 3.  SKIN: No obvious rash, lesion, or ulcer.   LABORATORY PANEL:   CBC  Recent Labs Lab 12/05/16 0718  WBC 9.0  HGB 11.3*  HCT 33.6*  PLT 258   ------------------------------------------------------------------------------------------------------------------ Chemistries   Recent Labs Lab 11/29/16 1347  12/05/16 0718  NA 141  < > 135  K 4.1  < > 3.8  CL 95*  < > 95*  CO2 33*  < > 29  GLUCOSE 117*  < > 115*  BUN 45*  < > 84*  CREATININE 1.15*  < > 1.64*  CALCIUM 8.6*  < > 8.4*  MG 2.0  --   --   AST 27  --   --   ALT 17  --   --   ALKPHOS 65  --   --   BILITOT 1.1  --   --   < > = values in this interval not displayed. ------------------------------------------------------------------------------------------------------------------  Cardiac Enzymes  Recent Labs Lab 11/29/16 1347  TROPONINI 0.05*   ------------------------------------------------------------------------------------------------------------------  RADIOLOGY:  Dg Chest 2 View  Result Date: 12/04/2016 CLINICAL DATA:  Acute onset of shortness of breath and dyspnea. Initial encounter. EXAM: CHEST  2 VIEW COMPARISON:  Chest radiograph performed 12/01/2016,  and CTA of the chest performed 11/22/2016 FINDINGS: The lungs are well-aerated. Scattered bilateral parenchymal scarring is noted. The left upper lobe pneumonia has improved. The underlying left lower lobe pulmonary nodule is not well characterized on  radiograph. No pleural effusion or pneumothorax is seen. The heart is mildly enlarged. A moderate hiatal hernia is again noted. No acute osseous abnormalities are seen. IMPRESSION: 1. Interval improvement in left upper lobe pneumonia. 2. Scattered bilateral parenchymal scarring is noted. Underlying left lower lobe pulmonary nodule is not well characterized on radiograph, though it appears stable on recent CTA. 3. Mild cardiomegaly. 4. Moderate hiatal hernia. Electronically Signed   By: Garald Balding M.D.   On: 12/04/2016 19:52   ASSESSMENT AND PLAN:   * Influenza B with LUL infiltrate On Tamiflu. Levaquin. Isolation.  * Acute COPD exacerbation with acute hypoxic respiratory failure Due to influenza B -IV steroids, Antibiotics - Scheduled Nebulizers - Inhalers -Wean O2 as tolerated - initiate COPD GOLD - high risk for readmission - Pulmo c/s - get ABG  * Atrial fibrillation with rapid ventricular rate In and out of Afib . Rate controlled  * Acute on chronic diastolic CHF, mild Improved. She received one dose IV Lasix on admission. Continue torsemide from home. Repeat CXR showed no edema  * Hypertension Continue medications  * DVT prophylaxis with Lovenox  Appreciate Palliative care input.    All the records are reviewed and case discussed with Care Management/Social Worker Management plans discussed with the patient, nursing and they are in agreement.  CODE STATUS: FULL CODE  DVT Prophylaxis: SCDs  TOTAL TIME TAKING CARE OF THIS PATIENT: 35 minutes  Possible discharge in 2-3 days depending on clinical condition. She doesn't seem to have improved much. May require transfer to ICU-Stepdown  Max Sane M.D on 12/05/2016 at 1:13 PM  Between 7am to 6pm - Pager - [phone]  After 6pm go to [email or web address] - password [email or web address] [place] [facility]  Office  [phone]  CC: Primary care physician; Pcp Not In System  Note: This dictation was prepared with  Dragon dictation along with smaller phrase technology. Any transcriptional errors that result from this process are unintentional.

## 2016-12-05 NOTE — Care Management (Signed)
Patient does not qualify for [facility]. Have reached out to the thn representative to discuss but patient is followed by a PCP at [facility] and think this physician does not participate with the program. Palliative care consult is pending for goals of treatment.  She is currently requiring prn xanax for anxiety from shortness of breath.  Will speak with attending regarding whether triology may help manage symptoms.  Flutter valve has been ordered.

## 2016-12-05 NOTE — Progress Notes (Signed)
Physical Therapy Treatment Patient Details Name: Toni [name] MRN: [id number] DOB: Jan 27, 1935 Today's Date: 12/05/2016    History of Present Illness Toni [name] is a 82 y.o. female with a known history of chronic diastolic CHF, COPD, hypertension, hypothyroidism, CKD stage II, paroxysmal atrial fib/a.flutter comes to [facility] on 3/14 due to shortness of breath.  Pt noted to be in afib with RVR in ED. Pt recently DC from this facility for similar issue 1WA. Pt on droplet precautions for Flu.     PT Comments    Pt is making gradual progress towards goals, however still limited by breathing. O2 sats WNL at rest, however heavy labored breathing noted. Pt refuses ambulation this date secondary to breathing, however agreeable to there-ex while seated in chair. Pt fatigues quickly, will continue to progress.   Follow Up Recommendations  Home health PT     Equipment Recommendations  None recommended by PT    Recommendations for Other Services       Precautions / Restrictions Precautions Precautions: Fall Restrictions Weight Bearing Restrictions: No    Mobility  Bed Mobility               General bed mobility comments: not performed as pt received in chair  Transfers                 General transfer comment: refused as pt is having trouble breathing, only agreeable to therex  Ambulation/Gait                 Stairs            Wheelchair Mobility    Modified Rankin (Stroke Patients Only)       Balance                                    Cognition Arousal/Alertness: Awake/alert Behavior During Therapy: WFL for tasks assessed/performed Overall Cognitive Status: Within Functional Limits for tasks assessed                      Exercises Other Exercises Other Exercises: pt educated in pursed lip breathing to support SOB, able to demonstrate but required cues to continue Other Exercises: Pt performed seated ther-ex including  alt. B LE LAQ, hip add squeezes, and hip abd/add. All ther-ex performed x 10 reps with cues for taking breaks as necessary. CGA given for assistance    General Comments        Pertinent Vitals/Pain Pain Assessment: Faces Faces Pain Scale: Hurts little more Pain Location: chest-ribs-breathing Pain Descriptors / Indicators: Aching;Dull Pain Intervention(s): Limited activity within patient's tolerance    Home Living                      Prior Function            PT Goals (current goals can now be found in the care plan section) Acute Rehab PT Goals Patient Stated Goal: go home PT Goal Formulation: With patient Time For Goal Achievement: 12/02/16 Potential to Achieve Goals: Good Progress towards PT goals: Progressing toward goals    Frequency    Min 2X/week      PT Plan Current plan remains appropriate    Co-evaluation             End of Session Equipment Utilized During Treatment: Oxygen Activity Tolerance: Treatment limited secondary to medical complications (Comment) Patient  left: in chair;with call bell/phone within reach;with chair alarm set Nurse Communication: Mobility status PT Visit Diagnosis: Unsteadiness on feet (R26.81);Muscle weakness (generalized) (M62.81)     Time: 1761-6073 PT Time Calculation (min) (ACUTE ONLY): 9 min  Charges:  $Therapeutic Exercise: 8-22 mins                    G Codes:       Romana Deaton 2016/12/07, 4:46 PM  Greggory Stallion, PT, DPT [phone]

## 2016-12-05 NOTE — Progress Notes (Signed)
Nutrition Brief Note  RD consulted for COPD Gold protocol  Wt Readings from Last 15 Encounters:  12/05/16 184 lb 15.5 oz (83.9 kg)  11/23/16 191 lb 9.6 oz (86.9 kg)  11/19/16 196 lb 8 oz (89.1 kg)  10/10/16 191 lb 5.8 oz (86.8 kg)  09/15/16 192 lb 8 oz (87.3 kg)  08/15/16 189 lb (85.7 kg)  08/03/16 194 lb 8 oz (88.2 kg)  08/01/16 194 lb (88 kg)  07/23/16 175 lb (79.4 kg)  06/02/16 185 lb 6.4 oz (84.1 kg)  05/03/16 191 lb (86.6 kg)  02/26/16 187 lb 6.4 oz (85 kg)  02/16/16 183 lb (83 kg)  01/25/16 177 lb (80.3 kg)  12/21/15 171 lb (77.6 kg)   Spoke with Ms. Carlis Abbott at bedside. She was discharged 3/8 - exhibits a 12#/6.1% wt loss over 2 months at this point but looking at patient's weight history - demonstrates weight fluctuations likely related to edema. Patient is unsure of weight loss, but admits to good appetite PTA, unsure if she was eating less at home. Ate 100% of meal during my visit this morning. Does not show any s/s of malnutrition. Weight loss likely r/t fluid fluctuations. No need for interventions at this time. RD will continue to monitor intake.  Body mass index is 29.85 kg/m. Patient meets criteria for overweight based on current BMI.   Current diet order is Heart Healthy, patient is consuming approximately 75-100% of meals at this time. Labs and medications reviewed.   No nutrition interventions warranted at this time. If nutrition issues arise, please consult RD.   Satira Anis. Shari Natt, MS, RD LDN Inpatient Clinical Dietitian Pager [phone]

## 2016-12-05 NOTE — Consult Note (Signed)
Consultation Note Date: 12/05/2016   Patient Name: Toni [name]  DOB: 05-01-1935  MRN: [id number]  Age / Sex: 82 y.o., female  PCP: Pcp Not In System Referring Physician: Max Sane, MD  Reason for Consultation: Establishing goals of care  HPI/Patient Profile: 82 y.o. female  with past medical history of COPD on HS home oxygen, lung nodule, RUL squamous cell lung carcinoma, CKD II, essential hypertension, chronic diastolic heart failure, paroxysmal atrial fibrillation and hypothyroidisim admitted on 11/29/2016 with shortness of breath. Recently discharged on 11/23/16 with COPD exacerbation. In ED, positive for influenza B with LUL infiltrate. Also with acute COPD exacerbation with acute hypoxic respiratory failure--receiving tamiflu, levaquin, IV steroids, nebulizers, and inhalers. Currently on O2 at 2L. Acute on chronic diastolic CHF and receiving torsemide. Followed by Dr. Grayland Ormond for RUL squamous cell lung carcinoma. Note reviewed from January 2018. Palliative medicine consultation for goals of care. [facility] NP, Leonard Downing saw patient in February. Note reviewed.     Clinical Assessment and Goals of Care: I have reviewed medical records and met with patient to discuss diagnosis, GOC, EOL wishes, disposition and options.  Introduced Palliative Medicine as specialized medical care for people living with serious illness. It focuses on providing relief from the symptoms and stress of a serious illness. The goal is to improve quality of life for both the patient and the family.  We discussed a brief life review of the patient. Toni [name] has been married to husband for 45 years. They have 3 sons and 6 grandchildren. 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 She speaks of staying on her feet and busy when possible. She has had COPD for many years and uses oxygen as needed at night.   Discussed  current hospitalization and interventions. Discussed natural trajectory of COPD and CHF that these irreversible conditions are contributing to dyspnea at rest. Explained acute conditions with underlying chronic co-morbidities that likely will contribute to a new baseline.   Advanced directives, concepts specific to code status, and artifical feeding and hydration were considered and discussed. Patient would want to be resuscitated and on life support short term if necessary. Educated on my recommendation for DNR/DNI with age and chronic co-morbidities. Her husband has advanced directive paperwork but they have not yet completed. Strongly encouraged she discuss advanced directives, code status, and EOL wishes with husband and family as disease continues to progress. Also, that these are big decisions they will be faced with in the future.   Palliative Care services outpatient were explained and offered. Patient agreeable.    SUMMARY OF RECOMMENDATIONS    FULL code. Educated on recommendation for DNR/DNI with age and chronic, progressive co-morbidities.   Full scope treatment.   PT recommending home health PT on discharge.   Could benefit from palliative services to follow outpatient to continue [place] conversations with patient and family as chronic diseases continue to progress.   PMT will continue to support patient/family through hospitalization.   Code Status/Advance Care Planning:  Full code  Symptom Management:   Per attending  Palliative Prophylaxis:   Aspiration, Delirium Protocol and Frequent Pain Assessment  Additional Recommendations (Limitations, Scope, Preferences):  Full Scope Treatment  Psycho-social/Spiritual:   Desire for further Chaplaincy support:no  Additional Recommendations: Caregiving  Support/Resources  Prognosis:   Unable to determine  Discharge Planning: Home with Home Health      Primary Diagnoses: Present on Admission: . COPD exacerbation  ([place])  I have reviewed the medical record, interviewed the patient and family, and examined the patient. The following aspects are pertinent.  Past Medical History:  Diagnosis Date  . Atypical atrial flutter ([place]) 11/16/2013   a. CHA2DS2VASc = 5-->prev on xarelto, d/c'd due to bleeding skin tear;  c. 11/2013->Placed on flecainide.  . Cataract   . Chronic diastolic CHF (congestive heart failure) ([place])    a. 11/2013 Echo: EF 55-60%, mild AS/MR, mod dil LA/RA, PASP 61mHg.  .Marland KitchenChronic diastolic heart failure ([place]   . Collagen vascular disease ([place]   . COPD (chronic obstructive pulmonary disease) ([place]   . Essential hypertension, benign   . Hypokalemia   . Hypothyroidism   . 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 Lung cancer ([place]   . Lung nodule    Followed by Dr. FRaul Del . PAF (paroxysmal atrial fibrillation) ([place]    a. 11/2013 Echo: EF 55-60%, mild AS/MR, mod dil LA/RA, PASP 32mg;  b. CHA2DS2VASc = 5-->prev on xarelto, d/c'd due to bleeding skin tear;  c. 11/2013->Placed on flecainide.   Social History   Social History  . Marital status: Married    Spouse name: N/A  . Number of children: N/A  . Years of education: N/A   Social History Main Topics  . Smoking status: Former Smoker    Packs/day: 1.50    Years: 50.00    Types: Cigarettes    Quit date: 09/18/2002  . Smokeless tobacco: Never Used  . Alcohol use No  . Drug use: No  . Sexual activity: Not Asked   Other Topics Concern  . None   Social History Narrative   Lives at home with husband. Independent at baseline.   Family History  Problem Relation Age of Onset  . CAD Father   . Heart disease Father   . Heart attack Father   . Arrhythmia Sister    Scheduled Meds: . arformoterol  15 mcg Nebulization Q12H  . budesonide  0.5 mg Nebulization BID  . diltiazem  300 mg Oral Daily  . docusate sodium  200 mg Oral BID  . enoxaparin (LOVENOX) injection  40 mg Subcutaneous Q24H  . levofloxacin  250  mg Oral Daily  . levothyroxine  75 mcg Oral QAC breakfast  . loratadine  10 mg Oral Daily  . mouth rinse  15 mL Mouth Rinse BID  . methylPREDNISolone (SOLU-MEDROL) injection  60 mg Intravenous Q12H  . nystatin  5 mL Oral QID  . pantoprazole  40 mg Oral QAC breakfast  . potassium chloride  20 mEq Oral BID  . sodium chloride flush  3 mL Intravenous Q12H  . torsemide  40 mg Oral BID   Continuous Infusions: PRN Meds:.acetaminophen **OR** acetaminophen, albuterol, ALPRAZolam, polyethylene glycol Medications Prior to Admission:  Prior to Admission medications   Medication Sig Start Date End Date Taking? Authorizing Provider  albuterol (PROVENTIL HFA) 108 (90 Base) MCG/ACT inhaler Inhale 2 puffs into the lungs every 4 (four) hours as needed for wheezing or shortness of breath. 07/23/16  Yes  Carrie Mew, MD  ALPRAZolam Duanne Moron) 0.25 MG tablet Take 1 tablet (0.25 mg total) by mouth at bedtime as needed for anxiety. 11/19/16  Yes Epifanio Lesches, MD  budesonide (PULMICORT) 0.5 MG/2ML nebulizer solution Take 2 mLs (0.5 mg total) by nebulization 2 (two) times daily. 06/08/15  Yes Flora Lipps, MD  cyanocobalamin (,VITAMIN B-12,) 1000 MCG/ML injection Vit B12 1000 mcg IM once a week for 4 weeks then once a month for 4 months 10/09/16  Yes Historical Provider, MD  diltiazem (CARDIZEM CD) 300 MG 24 hr capsule Take 300 mg by mouth daily.   Yes Historical Provider, MD  formoterol (PERFOROMIST) 20 MCG/2ML nebulizer solution Take 2 mLs (20 mcg total) by nebulization 2 (two) times daily. 06/08/15  Yes Flora Lipps, MD  ipratropium-albuterol (DUONEB) 0.5-2.5 (3) MG/3ML SOLN Take 3 mLs by nebulization every 4 (four) hours as needed (for wheezing/shortness of breath). DX Code: J44.9   DX: COPD 08/29/16  Yes Wilhelmina Mcardle, MD  levothyroxine (SYNTHROID, LEVOTHROID) 75 MCG tablet Take 75 mcg by mouth daily before breakfast.   Yes Historical Provider, MD  loratadine (CLARITIN) 10 MG tablet Take 10 mg by mouth daily.    Yes Historical Provider, MD  nystatin (MYCOSTATIN) 100000 UNIT/ML suspension Take 5 mLs (500,000 Units total) by mouth 4 (four) times daily. 11/19/16  Yes Epifanio Lesches, MD  omega-3 acid ethyl esters (LOVAZA) 1 g capsule Take 1 g by mouth daily.   Yes Historical Provider, MD  omeprazole (PRILOSEC) 20 MG capsule Take 20 mg by mouth 2 (two) times daily before a meal.   Yes Historical Provider, MD  potassium chloride (K-DUR,KLOR-CON) 10 MEQ tablet Take 2 tablets (20 mEq total) by mouth 2 (two) times daily. 08/03/16  Yes Minna Merritts, MD  torsemide (DEMADEX) 20 MG tablet Take 2 tablets (40 mg total) by mouth 2 (two) times daily. 11/19/16  Yes Epifanio Lesches, MD  predniSONE (STERAPRED UNI-PAK 21 TAB) 10 MG (21) TBPK tablet Taper by 10 mg daily Patient not taking: Reported on 11/29/2016 11/19/16   Epifanio Lesches, MD   Allergies  Allergen Reactions  . Belladonna Alkaloids Anaphylaxis and Other (See Comments)    Pt states that this medication makes her eyes cross.    . Dabigatran Other (See Comments)   Review of Systems  Constitutional: Positive for activity change.  Respiratory: Positive for shortness of breath and wheezing.   Cardiovascular: Positive for leg swelling.  Neurological: Positive for weakness.  All other systems reviewed and are negative.  Physical Exam  Constitutional: She is oriented to person, place, and time. She is cooperative.  HENT:  Head: Normocephalic and atraumatic.  Cardiovascular: Normal heart sounds.  An irregularly irregular rhythm present.  Pulmonary/Chest: She has wheezes.  Dyspnea at rest--patient worked with physical therapy prior to our conversation.  Abdominal: Normal appearance and bowel sounds are normal.  Musculoskeletal: She exhibits edema (BLE).  Neurological: She is alert and oriented to person, place, and time.  Skin: Skin is warm and dry.  Psychiatric: She has a normal mood and affect. Her speech is normal and behavior is normal.  Cognition and memory are normal.  Nursing note and vitals reviewed.  Vital Signs: BP 132/75 (BP Location: Left Arm)   Pulse 83   Temp 98.3 F (36.8 C) (Oral)   Resp 20   Ht 5' 6"  (1.676 m)   Wt 83.9 kg (184 lb 15.5 oz)   SpO2 94%   BMI 29.85 kg/m  Pain Assessment: 0-10   Pain  Score: 0-No pain  SpO2: SpO2: 94 % O2 Device:SpO2: 94 % O2 Flow Rate: .O2 Flow Rate (L/min): 2 L/min  IO: Intake/output summary:   Intake/Output Summary (Last 24 hours) at 12/05/16 0945 Last data filed at 12/05/16 0240  Gross per 24 hour  Intake              240 ml  Output             2053 ml  Net            -1813 ml    LBM: Last BM Date: 12/02/16 Baseline Weight: Weight: 83.8 kg (184 lb 12.8 oz) Most recent weight: Weight: 83.9 kg (184 lb 15.5 oz)     Palliative Assessment/Data: PPS 50%   Flowsheet Rows     Most Recent Value  Intake Tab  Referral Department  Hospitalist  Unit at Time of Referral  Cardiac/Telemetry Unit  Palliative Care Primary Diagnosis  Pulmonary  Palliative Care Type  New Palliative care  Reason for referral  Clarify Goals of Care  Clinical Assessment  Palliative Performance Scale Score  50%  Psychosocial & Spiritual Assessment  Palliative Care Outcomes  Patient/Family meeting held?  Yes  Who was at the meeting?  patient  Palliative Care Outcomes  Clarified goals of care, Provided psychosocial or spiritual support, ACP counseling assistance, Linked to palliative care logitudinal support     Time In: 0845 Time Out: 1000 Time Total: 76mn Greater than 50%  of this time was spent counseling and coordinating care related to the above assessment and plan.  Signed by:  MIhor Dow FNP-C Palliative Medicine Team  Phone: [phone]: [phone]  Please contact Palliative Medicine Team phone at [phone] questions and concerns.  For individual provider: See AShea Evans

## 2016-12-05 NOTE — Evaluation (Signed)
Occupational Therapy Evaluation Patient Details Name: Toni [name] MRN: [id number] DOB: 1935-02-24 Today's Date: 12/05/2016    History of Present Illness Toni [name] is a 82 y.o. female with a known history of chronic diastolic CHF, COPD, hypertension, hypothyroidism, CKD stage II, paroxysmal atrial fib/a.flutter comes to [facility] on 3/14 due to shortness of breath.  Pt noted to be in afib with RVR in ED. Pt recently DC from this facility for similar issue 1WA. Pt on droplet precautions for Flu.    Clinical Impression   Pt seen for OT evaluation this date. 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 During functional tasks on room air within room, pt demonstrates increased effort after ambulating approx 15 feet with min guard, performing toileting with min assist for hygiene thoroughness, and standing at sink to wash hands with close supervision, at which point pt requested to sit. O2 sats >96% on RA during functional activity, HR upper 90's with O2 sats increasing to 99% with nasal cannula reapplied once seated EOB. Pt presents with decreased activity tolerance/strength/exertion with minimal activity, and increased risk for falls and would benefit from skilled OT services to address noted impairments and functional deficits in order to maximize return to PLOF and minimize risk of falls. This includes education/training in pursed lip breathing, energy conservation strategies, and work simplification.     Follow Up Recommendations  Home health OT    Equipment Recommendations  3 in 1 bedside commode    Recommendations for Other Services       Precautions / Restrictions Precautions Precautions: Fall Restrictions Weight Bearing  Restrictions: No      Mobility Bed Mobility Overal bed mobility: Needs Assistance Bed Mobility: Sit to Supine       Sit to supine: HOB elevated;Min guard   General bed mobility comments: min guard as pt performed sit>sup with verbal cues for hand placement controlled descent EOB to maximize safety  Transfers Overall transfer level: Needs assistance Equipment used: None Transfers: Sit to/from Stand Sit to Stand: Min guard         General transfer comment: slightly kyphotic, fingertip touch on furniture/walls once ambulating    Balance Overall balance assessment: Needs assistance Sitting-balance support: No upper extremity supported;Feet supported Sitting balance-Leahy Scale: Good     Standing balance support: No upper extremity supported;During functional activity Standing balance-Leahy Scale: Good Standing balance comment: able to wash hands at sink with supervision, no UE support, no LOB                            ADL Overall ADL's : Needs assistance/impaired Eating/Feeding: Set up;Sitting   Grooming: Standing;Oral care;Wash/dry face;Wash/dry hands;Supervision/safety   Upper Body Bathing: Set up;Sitting;Supervision/ safety   Lower Body Bathing: Sit to/from stand;Minimal assistance   Upper Body Dressing : Sitting;Set up;Minimal assistance       Toilet Transfer: Supervision/safety;Comfort height toilet;Ambulation Toilet Transfer Details (indicate cue type and reason): pt ambulated to bathroom with min guard and performed toilet transfer with close supervision, good safe hand placement to [facility] over toilet Toileting- Clothing Manipulation and Hygiene: Minimal assistance;Sitting/lateral lean;Sit to/from stand Toileting - Clothing Manipulation Details (indicate cue type and reason): min assist for thoroughness  Functional mobility during ADLs: Min guard (fingertip touch on furniture/walls, which is her baseline) General ADL Comments: Pt generally min  assist for LB ADL, cueing for PLB as pt demonstrates increased exertion with brief walk from recliner to bathroom and back     Vision Baseline Vision/History: Wears glasses Wears Glasses: Reading only Patient Visual Report: No change from baseline Vision Assessment?: No apparent visual deficits     Perception     Praxis Praxis Praxis tested?: Within functional limits    Pertinent Vitals/Pain Pain Assessment: No/denies pain     Hand Dominance Right   Extremity/Trunk Assessment Upper Extremity Assessment Upper Extremity Assessment: Overall WFL for tasks assessed (grossly 3+/5)   Lower Extremity Assessment Lower Extremity Assessment: Defer to PT evaluation;Generalized weakness   Cervical / Trunk Assessment Cervical / Trunk Assessment: Kyphotic   Communication Communication Communication: HOH, wears hearing aids   Cognition Arousal/Alertness: Awake/alert Behavior During Therapy: WFL for tasks assessed/performed Overall Cognitive Status: Within Functional Limits for tasks assessed                     General Comments       Exercises   Other Exercises Other Exercises: pt educated in pursed lip breathing to support SOB, able to demonstrate but required cues to continue   Shoulder Instructions      Home Living Family/patient expects to be discharged to:: Private residence Living Arrangements: Spouse/significant other Available Help at Discharge: Family;Available 24 hours/day Type of Home: House Home Access: Stairs to enter [employer] of Steps: 2 Entrance Stairs-Rails: Right Home Layout: One level (has basement, but no need to go down there)     Bathroom Shower/Tub: Tub/shower unit Shower/tub characteristics: Architectural technologist: Handicapped height Bathroom Accessibility: Yes How Accessible: Accessible via walker Home Equipment: Shower seat;Grab bars - tub/shower;Walker - 4 wheels          Prior Functioning/Environment Level of  Independence: Independent        Comments: Indep with ADLs, household and community mobility without assistive device; 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  Denies fall history; home O2 at night at 2L/min        OT Problem List: Decreased strength;Cardiopulmonary status limiting activity;Decreased activity tolerance;Decreased safety awareness;Decreased knowledge of use of DME or AE      OT Treatment/Interventions: Self-care/ADL training;Energy conservation;Patient/family education;DME and/or AE instruction    OT Goals(Current goals can be found in the care plan section) Acute Rehab OT Goals Patient Stated Goal: go home OT Goal Formulation: With patient Time For Goal Achievement: 12/19/16 Potential to Achieve Goals: Good ADL Goals Additional ADL Goal #1: Pt will verbalize plan for implementing at least 1 learned ECS to support functional independence and falls prevention.  OT Frequency: Min 1X/week   Barriers to D/C:            Co-evaluation              End of Session Equipment Utilized During Treatment: Gait belt  Activity Tolerance: Patient limited by fatigue Patient left: in bed;with call bell/phone within reach;Other (comment) (with NP in room)  OT Visit Diagnosis: Other abnormalities of gait and mobility (R26.89);Muscle weakness (generalized) (M62.81)                ADL either performed or assessed with clinical judgement  Time: 4627-0350 OT Time Calculation (min): 36 min Charges:  OT General Charges $OT Visit: 1 Procedure OT Evaluation $OT Eval Low Complexity: 1 Procedure OT Treatments $Self Care/Home  Management : 23-37 mins G-Codes:     Jeni Salles, MPH, MS, OTR/L ascom [phone] 12/05/16, 9:43 AM

## 2016-12-06 LAB — BLOOD GAS, ARTERIAL
Acid-Base Excess: 7.7 mmol/L — ABNORMAL HIGH (ref 0.0–2.0)
Bicarbonate: 32 mmol/L — ABNORMAL HIGH (ref 20.0–28.0)
FIO2: 0.28
O2 SAT: 94.1 %
PCO2 ART: 43 mmHg (ref 32.0–48.0)
PH ART: 7.48 — AB (ref 7.350–7.450)
Patient temperature: 37
pO2, Arterial: 66 mmHg — ABNORMAL LOW (ref 83.0–108.0)

## 2016-12-06 LAB — CREATININE, SERUM
Creatinine, Ser: 1.79 mg/dL — ABNORMAL HIGH (ref 0.44–1.00)
GFR calc Af Amer: 30 mL/min — ABNORMAL LOW (ref >60)
GFR calc non Af Amer: 26 mL/min — ABNORMAL LOW (ref >60)

## 2016-12-06 MED ORDER — MORPHINE SULFATE (PF) 4 MG/ML IV SOLN
2.0000 mg | INTRAVENOUS | Status: DC | PRN
  Administered 2016-12-06 – 2016-12-08 (×3): 2 mg via INTRAVENOUS
  Filled 2016-12-06 (×3): qty 1

## 2016-12-06 MED ORDER — LORAZEPAM 2 MG/ML IJ SOLN
1.0000 mg | INTRAMUSCULAR | Status: DC | PRN
  Administered 2016-12-06 – 2016-12-08 (×7): 1 mg via INTRAVENOUS
  Filled 2016-12-06 (×7): qty 1

## 2016-12-06 MED ORDER — ENOXAPARIN SODIUM 30 MG/0.3ML ~~LOC~~ SOLN: 30.0000 mg | SUBCUTANEOUS | Status: DC

## 2016-12-06 MED ORDER — LEVOFLOXACIN 750 MG PO TABS
750.0000 mg | ORAL_TABLET | ORAL | Status: DC
  Administered 2016-12-06: 750 mg via ORAL
  Filled 2016-12-06: qty 1

## 2016-12-06 MED ORDER — MORPHINE SULFATE (CONCENTRATE) 10 MG/0.5ML PO SOLN
5.0000 mg | ORAL | Status: DC | PRN
  Administered 2016-12-07: 5 mg via SUBLINGUAL
  Filled 2016-12-06: qty 1

## 2016-12-06 NOTE — Progress Notes (Addendum)
OT Cancellation Note  Patient Details Name: Toni [name] MRN: [id number] DOB: [birth year]   Cancelled Treatment:    Reason Eval/Treat Not Completed: Patient declined, no reason specified. Attempted to see pt for OT treatment with focus on energy conservation strategies. Spouse in room, pt tearful. Spouse and patient requesting OT come back, as this was not a good time for them. When asked if there was anything they needed, pt's spouse declined, stating that "No, thank you, they are working in things." Spoke with PT who confirmed with RN that pt is now comfort care. Will discharge OT services at this time. Please re-consult if additional acute OT needs are identified.  Jeni Salles, MPH, MS, OTR/L ascom [phone] 12/06/16, 2:59 PM

## 2016-12-06 NOTE — Progress Notes (Signed)
[place] at [place] NAME: Toni [name]    MR#:  [id number]  DATE OF BIRTH:  [birth year]  SUBJECTIVE:  CHIEF COMPLAINT:   Chief Complaint  Patient presents with  . Shortness of Breath  looks miserable, struggling to breath. Husband at bedside REVIEW OF SYSTEMS:    Review of Systems  Constitutional: Positive for malaise/fatigue. Negative for chills and fever.  HENT: Negative for sore throat.   Eyes: Negative for blurred vision, double vision and pain.  Respiratory: Positive for cough, shortness of breath and wheezing. Negative for hemoptysis.   Cardiovascular: Negative for chest pain, palpitations, orthopnea and leg swelling.  Gastrointestinal: Negative for abdominal pain, constipation, diarrhea, heartburn, nausea and vomiting.  Genitourinary: Negative for dysuria and hematuria.  Musculoskeletal: Negative for back pain and joint pain.  Skin: Negative for rash.  Neurological: Positive for weakness. Negative for sensory change, speech change, focal weakness and headaches.  Endo/Heme/Allergies: Does not bruise/bleed easily.  Psychiatric/Behavioral: Negative for depression. The patient is not nervous/anxious.    DRUG ALLERGIES:   Allergies  Allergen Reactions  . Belladonna Alkaloids Anaphylaxis and Other (See Comments)    Pt states that this medication makes her eyes cross.    . Dabigatran Other (See Comments)    VITALS:  Blood pressure (!) 103/54, pulse (!) 119, temperature 98.2 F (36.8 C), temperature source Oral, resp. rate (!) 22, height '5\' 6"'$  (1.676 m), weight 83.2 kg (183 lb 6.8 oz), SpO2 97 %.  PHYSICAL EXAMINATION:   Physical Exam  GENERAL:  82 y.o.-year-old patient lying in the bed in acute resp distress.  EYES: Pupils equal, round, reactive to light and accommodation. No scleral icterus. Extraocular muscles intact.  HEENT: Head atraumatic, normocephalic. Oropharynx and nasopharynx clear.  NECK:  Supple, no jugular venous  distention. No thyroid enlargement, no tenderness.  LUNGS: Increased work of breathing with bilateral wheezing. Decreased air entry. Wheezing + CARDIOVASCULAR: S1, S2 normal. No murmurs, rubs, or gallops.  ABDOMEN: Soft, nontender, nondistended. Bowel sounds present. No organomegaly or mass.  EXTREMITIES: No cyanosis, clubbing or edema b/l.    NEUROLOGIC: Cranial nerves II through XII are intact. No focal Motor or sensory deficits b/l.   PSYCHIATRIC: The patient is alert and oriented x 3.  SKIN: No obvious rash, lesion, or ulcer.  LABORATORY PANEL:   CBC  Recent Labs Lab 12/05/16 0718  WBC 9.0  HGB 11.3*  HCT 33.6*  PLT 258   ------------------------------------------------------------------------------------------------------------------ Chemistries   Recent Labs Lab 12/05/16 0718 12/06/16 0535  NA 135  --   K 3.8  --   CL 95*  --   CO2 29  --   GLUCOSE 115*  --   BUN 84*  --   CREATININE 1.64* 1.79*  CALCIUM 8.4*  --    ------------------------------------------------------------------------------------------------------------------  Cardiac Enzymes No results for input(s): TROPONINI in the last 168 hours. ------------------------------------------------------------------------------------------------------------------  RADIOLOGY:  Dg Chest 2 View  Result Date: 12/04/2016 CLINICAL DATA:  Acute onset of shortness of breath and dyspnea. Initial encounter. EXAM: CHEST  2 VIEW COMPARISON:  Chest radiograph performed 12/01/2016, and CTA of the chest performed 11/22/2016 FINDINGS: The lungs are well-aerated. Scattered bilateral parenchymal scarring is noted. The left upper lobe pneumonia has improved. The underlying left lower lobe pulmonary nodule is not well characterized on radiograph. No pleural effusion or pneumothorax is seen. The heart is mildly enlarged. A moderate hiatal hernia is again noted. No acute osseous abnormalities are seen. IMPRESSION: 1. Interval  improvement  in left upper lobe pneumonia. 2. Scattered bilateral parenchymal scarring is noted. Underlying left lower lobe pulmonary nodule is not well characterized on radiograph, though it appears stable on recent CTA. 3. Mild cardiomegaly. 4. Moderate hiatal hernia. Electronically Signed   By: Garald Balding M.D.   On: 12/04/2016 19:52   ASSESSMENT AND PLAN:   * Influenza B with LUL infiltrate * Acute COPD exacerbation with acute hypoxic respiratory failure Due to influenza B * Atrial fibrillation with rapid ventricular rate * Acute on chronic diastolic CHF, mild * Hypertension   After long d/w patient she is comfort care   All the records are reviewed and case discussed with Care Management/Social Worker Management plans discussed with the patient, family and they are in agreement.  CODE STATUS: FULL CODE  DVT Prophylaxis: SCDs  TOTAL TIME TAKING CARE OF THIS PATIENT: 35 minutes  Possible discharge in 1 days depending on clinical condition. Home with Hospice  Max Sane M.D on 12/06/2016 at 5:11 PM  Between 7am to 6pm - Pager - [phone]  After 6pm go to [email or web address] - password [email or web address] [place] [facility]  Office  [phone]  CC: Primary care physician; Pcp Not In System  Note: This dictation was prepared with Dragon dictation along with smaller phrase technology. Any transcriptional errors that result from this process are unintentional.

## 2016-12-06 NOTE — Progress Notes (Signed)
Pt stated to unit clerk that legs are hurting. Upon arrival to run, pt asleep. Will continue to monitor and assess.

## 2016-12-06 NOTE — Progress Notes (Signed)
This was an end of life order Chaplain responded to. Pt. Was seated in a recliner and her husband was outside of the room talking on cell-phone. When Chaplain visited patient, she said, "I do not want to die." Chaplain tried to explore what patient meant about not wanting to die. She said she was not afraid of dying because Jesus was her Reita Cliche and Ambulance person. Pt. Husband interjected in conversation by saying that everyone shall die. Chaplain prayed with patient and her husband.   12/06/16 1500  Clinical Encounter Type  Visited With Patient and family together  Visit Type Initial  Referral From Nurse  Consult/Referral To Chaplain  Spiritual Encounters  Spiritual Needs Prayer

## 2016-12-06 NOTE — Care Management (Addendum)
Informed that patient is now DNR and has been made comfort care.  Updated [facility].  Was informed that this agency can provide hospice services in the home in [place] out of their [place] office.  Confirmed that patient has home nebulizer machine and continuous 02.  She and her husband is agreeable with discharge home with Hospice.  Discussed with attending and primary nurse of need to transition comfort meds to oral rather than IV.  Discussed this with patient and her husband but CM had to repeat information several times.  Patient did always come back and verbalize- I want to go home and be comfortable. That lung doctor said there was nothing else that can be done. Agency preference is [facility].  Notified agency and faxed the actual home hospice order.  Discussed anticipated discharge 3.22 and hospital bed should be in place prior to patient home arrival.  Placed DNR on chart for signature.  Will anticipate need for ems transport at present.

## 2016-12-06 NOTE — Progress Notes (Signed)
Came to see pt, and RN informed that the patient is now on comfort measures. She appears to be dyspneic, is now receiving morphine for dyspnea.  Pt is going to meet with palliative care today, therefore will defer to palliative care service regarding symptom management.   Will sign off at this time.  -Marda Stalker, M.D.  Pulmonary.  12/06/2016

## 2016-12-06 NOTE — Progress Notes (Signed)
Patient is transfer to 2 C   Room 205 As per order , report given to floor nurse , husband at bedside

## 2016-12-06 NOTE — Progress Notes (Signed)
Family Meeting Note  Advance Directive:yes  Today a meeting took place with the Patient.  The following clinical team members were present during this meeting:MD and RN  The following were discussed:Patient's diagnosis: , Patient's progosis: < 6 weeks and Goals for treatment: DNR  Additional follow-up to be provided: Comfort care, Home with Hospice  Time spent during discussion:20 minutes  Max Sane, MD

## 2016-12-06 NOTE — Progress Notes (Signed)
PT Cancellation Note  Patient Details Name: SAYURI RHAMES MRN: [id number] DOB: [birth year]   Cancelled Treatment:    Reason Eval/Treat Not Completed: Other (comment). Pt on comfort care at this time. Will dc current orders. No further needs identified.   Baneza Bartoszek 12/06/2016, 3:06 PM  Greggory Stallion, PT, DPT [phone]

## 2016-12-06 NOTE — Progress Notes (Signed)
Enoxaparin dose decreased from 40 mg subcutaneously daily to 30 mg per protocol for CrCl < 30 mL/min.  Lenis Noon, PharmD 12/06/16 11:11 AM

## 2016-12-06 NOTE — Progress Notes (Signed)
Levofloxacin dose increased from 250 mg PO daily to 750 mg PO q48h per renal function per protocol.   Lenis Noon, PharmD 12/06/16 9:49 AM

## 2016-12-07 ENCOUNTER — Encounter: Payer: Medicare Other | Admitting: Cardiovascular Disease

## 2016-12-07 DIAGNOSIS — Z7189 Other specified counseling: Secondary | ICD-10-CM

## 2016-12-07 DIAGNOSIS — R0603 Acute respiratory distress: Secondary | ICD-10-CM

## 2016-12-07 MED ORDER — MORPHINE SULFATE (CONCENTRATE) 10 MG/0.5ML PO SOLN
5.0000 mg | ORAL | Status: DC | PRN
  Administered 2016-12-07 – 2016-12-08 (×3): 5 mg via SUBLINGUAL
  Filled 2016-12-07 (×3): qty 1

## 2016-12-07 MED ORDER — ALBUTEROL SULFATE (2.5 MG/3ML) 0.083% IN NEBU
2.5000 mg | INHALATION_SOLUTION | RESPIRATORY_TRACT | Status: DC | PRN
  Administered 2016-12-07 – 2016-12-08 (×4): 2.5 mg via RESPIRATORY_TRACT
  Filled 2016-12-07 (×4): qty 3

## 2016-12-07 NOTE — Progress Notes (Signed)
[place] at [place] NAME: Toni [name]    MR#:  [id number]  DATE OF BIRTH:  October 08, 1934  SUBJECTIVE:  CHIEF COMPLAINT:   Chief Complaint  Patient presents with  . Shortness of Breath  Looks somewhat better after getting morphine and Ativan, she is thankful REVIEW OF SYSTEMS:    Review of Systems  Constitutional: Positive for malaise/fatigue. Negative for chills and fever.  HENT: Negative for sore throat.   Eyes: Negative for blurred vision, double vision and pain.  Respiratory: Positive for cough, shortness of breath and wheezing. Negative for hemoptysis.   Cardiovascular: Negative for chest pain, palpitations, orthopnea and leg swelling.  Gastrointestinal: Negative for abdominal pain, constipation, diarrhea, heartburn, nausea and vomiting.  Genitourinary: Negative for dysuria and hematuria.  Musculoskeletal: Negative for back pain and joint pain.  Skin: Negative for rash.  Neurological: Positive for weakness. Negative for sensory change, speech change, focal weakness and headaches.  Endo/Heme/Allergies: Does not bruise/bleed easily.  Psychiatric/Behavioral: Negative for depression. The patient is not nervous/anxious.    DRUG ALLERGIES:   Allergies  Allergen Reactions  . Belladonna Alkaloids Anaphylaxis and Other (See Comments)    Pt states that this medication makes her eyes cross.    . Dabigatran Other (See Comments)    VITALS:  Blood pressure 122/78, pulse (!) 58, temperature 97.9 F (36.6 C), temperature source Oral, resp. rate 18, height _0  (1.676 m), weight 83.2 kg (183 lb 6.8 oz), SpO2 98 %.  PHYSICAL EXAMINATION:   Physical Exam  GENERAL:  82 y.o.-year-old patient lying in the bed in acute resp distress.  EYES: Pupils equal, round, reactive to light and accommodation. No scleral icterus. Extraocular muscles intact.  HEENT: Head atraumatic, normocephalic. Oropharynx and nasopharynx clear.  NECK:  Supple, no jugular  venous distention. No thyroid enlargement, no tenderness.  LUNGS: Increased work of breathing with bilateral wheezing. Decreased air entry. Wheezing + CARDIOVASCULAR: S1, S2 normal. No murmurs, rubs, or gallops.  ABDOMEN: Soft, nontender, nondistended. Bowel sounds present. No organomegaly or mass.  EXTREMITIES: No cyanosis, clubbing or edema b/l.    NEUROLOGIC: Cranial nerves II through XII are intact. No focal Motor or sensory deficits b/l.   PSYCHIATRIC: The patient is alert and oriented x 3.  SKIN: No obvious rash, lesion, or ulcer.  LABORATORY PANEL:   CBC  Recent Labs Lab 12/05/16 0718  WBC 9.0  HGB 11.3*  HCT 33.6*  PLT 258   ------------------------------------------------------------------------------------------------------------------ Chemistries   Recent Labs Lab 12/05/16 0718 12/06/16 0535  NA 135  --   K 3.8  --   CL 95*  --   CO2 29  --   GLUCOSE 115*  --   BUN 84*  --   CREATININE 1.64* 1.79*  CALCIUM 8.4*  --    ------------------------------------------------------------------------------------------------------------------  Cardiac Enzymes No results for input(s): TROPONINI in the last 168 hours. ------------------------------------------------------------------------------------------------------------------  RADIOLOGY:  No results found. ASSESSMENT AND PLAN:   * Influenza B with LUL infiltrate * Acute COPD exacerbation with acute hypoxic respiratory failure Due to influenza B * Atrial fibrillation with rapid ventricular rate * Acute on chronic diastolic CHF, mild * Hypertension   Working on her meds adjustment for comfort.  Palliative care has met with her earlier today.  They have changed her morphine to oral form.  She will go home with [facility] hospice tomorrow morning.   All the records are reviewed and case discussed with Care Management/Social Worker Management plans discussed with the  patient, family and they are in  agreement.  CODE STATUS:DO NOT RESUSCITATE  DVT Prophylaxis: SCDs  TOTAL TIME TAKING CARE OF THIS PATIENT: 35 minutes  Possible discharge in 1 days depending on clinical condition. Home with Hospice  Max Sane M.D on 12/07/2016 at 2:21 PM  Between 7am to 6pm - Pager - [phone]  After 6pm go to [email or web address] - password [email or web address] [place] [facility]  Office  [phone]  CC: Primary care physician; Pcp Not In System  Note: This dictation was prepared with Dragon dictation along with smaller phrase technology. Any transcriptional errors that result from this process are unintentional.

## 2016-12-07 NOTE — Progress Notes (Signed)
Daily Progress Note   Patient Name: Toni [name]       Date: 12/07/2016 DOB: [birth year]  Age: 82 y.o. MRN#: [id number] Attending Physician: Max Sane, MD Primary Care Physician: Pcp Not In System Admit Date: 11/29/2016  Reason for Consultation/Follow-up: Establishing goals of care  Subjective: Patient awake, alert and sitting up in recliner. Two prn doses of morphine has seemed to help her dyspnea.   Confirmed her conversation with Dr. Manuella Ghazi yesterday evening with change to DNR/DNI and focus on comfort. Educated on comfort measures including comfort, quality, and dignity at the end of her life. Educated on hospice services and symptom management instead of escalation at hospital with antibiotics, steroids, etc. Patient can be forgetful at times--I question if she fully understands what hospice means. I spoke with husband via telephone to confirm understanding of hospice services and focus on comfort. Husband and three adult sons agree with home hospice services and focus on comfort as disease progresses.      Length of Stay: 8  Current Medications: Scheduled Meds:  . mouth rinse  15 mL Mouth Rinse BID    Continuous Infusions:  PRN Meds: acetaminophen **OR** acetaminophen, LORazepam, morphine injection, morphine CONCENTRATE  Physical Exam  Constitutional: She is oriented to person, place, and time. She is cooperative.  HENT:  Head: Normocephalic and atraumatic.  Cardiovascular: Regular rhythm and normal heart sounds.   Pulmonary/Chest: She has decreased breath sounds.  Crackles. Some dyspnea at rest that responds to prn morphine.   Abdominal: Normal appearance.  Neurological: She is alert and oriented to person, place, and time.  forgetful  Skin: Skin is warm and dry.    Psychiatric: Her speech is normal and behavior is normal.  Nursing note and vitals reviewed.          Vital Signs: BP 122/78 (BP Location: Left Arm)   Pulse (!) 58   Temp 97.9 F (36.6 C) (Oral)   Resp 18   Ht '5\' 6"'$  (1.676 m)   Wt 83.2 kg (183 lb 6.8 oz)   SpO2 98%   BMI 29.61 kg/m  SpO2: SpO2: 98 % O2 Device: O2 Device: Nasal Cannula O2 Flow Rate: O2 Flow Rate (L/min): 2 L/min  Intake/output summary:  Intake/Output Summary (Last 24 hours) at 12/07/16 0912 Last data filed at 12/07/16 0146  Gross  per 24 hour  Intake                0 ml  Output                0 ml  Net                0 ml   LBM: Last BM Date: 12/05/16 Baseline Weight: Weight: 83.8 kg (184 lb 12.8 oz) Most recent weight: Weight: 83.2 kg (183 lb 6.8 oz)       Palliative Assessment/Data: PPS 50%   Flowsheet Rows     Most Recent Value  Intake Tab  Referral Department  Hospitalist  Unit at Time of Referral  Cardiac/Telemetry Unit  Palliative Care Primary Diagnosis  Pulmonary  Date Notified  12/04/16  Palliative Care Type  Return patient Palliative Care  Reason for referral  Clarify Goals of Care  Date of Admission  11/29/16  Date first seen by Palliative Care  12/05/16  # of days Palliative referral response time  1 Day(s)  # of days IP prior to Palliative referral  5  Clinical Assessment  Palliative Performance Scale Score  50%  Psychosocial & Spiritual Assessment  Palliative Care Outcomes  Patient/Family meeting held?  Yes  Who was at the meeting?  patient  Palliative Care Outcomes  Clarified goals of care, Provided psychosocial or spiritual support, ACP counseling assistance, Linked to palliative care logitudinal support      Patient Active Problem List   Diagnosis Date Noted  . Acute on chronic diastolic heart failure ([place])   . Acute respiratory distress 11/22/2016  . Elevated troponin 11/22/2016  . Dyspnea   . Advance care planning   . Goals of care, counseling/discussion   .  Palliative care by specialist   . Congestive heart failure ([place])   . PAF (paroxysmal atrial fibrillation) ([place]) 11/09/2016  . Cough   . Anemia in chronic kidney disease 11/07/2016  . Aortic valve stenosis, moderate 08/03/2016  . Adjustment disorder with anxiety 02/23/2016  . Dyspepsia   . Esophageal reflux   . Encounter for anticoagulation discussion and counseling 11/17/2015  . Fall 11/17/2015  . Essential hypertension, benign   . Lung infiltrate on CT 06/08/2015  . Chronic diastolic CHF (congestive heart failure) ([place]) 05/17/2015  . Insomnia 04/17/2014  . Squamous cell lung cancer, right ([place]) 03/27/2014  . Postnasal drip 03/05/2014  . Hyperlipidemia 12/04/2013  . Bilateral leg edema 12/04/2013  . COPD exacerbation ([place]) 11/18/2013  . Atypical atrial flutter ([place]) 11/16/2013  . COPD (chronic obstructive pulmonary disease) ([place]) 11/15/2013  . Chronic kidney disease 08/24/2013  . Essential (primary) hypertension 08/24/2013  . Adult hypothyroidism 08/24/2013  . Acute exacerbation of chronic obstructive airways disease ([place]) 08/24/2013  . Chronic obstructive pulmonary disease with acute exacerbation ([place]) 08/24/2013    Palliative Care Assessment & Plan   Patient Profile: 82 y.o. female  with past medical history of COPD on HS home oxygen, lung nodule, RUL squamous cell lung carcinoma, CKD II, essential hypertension, chronic diastolic heart failure, paroxysmal atrial fibrillation and hypothyroidisim admitted on 11/29/2016 with shortness of breath. Recently discharged on 11/23/16 with COPD exacerbation. In ED, positive for influenza B with LUL infiltrate. Also with acute COPD exacerbation with acute hypoxic respiratory failure--receiving tamiflu, levaquin, IV steroids, nebulizers, and inhalers. Currently on O2 at 2L. Acute on chronic diastolic CHF and receiving torsemide. Followed by Dr. Grayland Ormond for RUL squamous cell lung carcinoma. Note reviewed from January 2018. Palliative medicine  consultation  for goals of care. [facility] NP, Leonard Downing saw patient in February. Note reviewed.     Assessment: COPD exacerbation Acute on chronic hypoxic respiratory failure Influenza B Atrial fibrillation Acute on chronic diastolic CHF  Hypertension  Recommendations/Plan:  DNR/DNI  Comfort measures.   Plan is home with hospice. I have discussed with patient and husband. Husband understand shift from aggressive medical pathway to comfort pathway.   Continue morphine as needed for pain/dyspnea/air hunger.   Fan at bedside may also help with dyspnea.  PMT will continue to shadow chart and support patient/family through hospitalization.   Goals of Care and Additional Recommendations:  Limitations on Scope of Treatment: Full Comfort Care  Code Status:   DNR   Code Status Orders        Start     Ordered   12/06/16 1447  DNR (Do not attempt resuscitation)  Continuous    Question Answer Comment  In the event of cardiac or respiratory ARREST Do not call a "code blue"   In the event of cardiac or respiratory ARREST Do not perform Intubation, CPR, defibrillation or ACLS   In the event of cardiac or respiratory ARREST Use medication by any route, position, wound care, and other measures to relive pain and suffering. May use oxygen, suction and manual treatment of airway obstruction as needed for comfort.      12/06/16 1447    Code Status History    Date Active Date Inactive Code Status Order ID Comments User Context   11/29/2016  4:05 PM 12/06/2016  2:47 PM Full Code [id number]  Hillary Bow, MD ED   11/22/2016  2:07 AM 11/23/2016  4:43 PM Full Code [id number]  Harvie Bridge, DO Inpatient   11/15/2016 12:21 PM 11/19/2016  6:24 PM DNR [id number]  Earlie Counts, NP Inpatient   11/03/2016  8:39 PM 11/15/2016 12:21 PM Full Code [id number]  Gladstone Lighter, MD Inpatient   02/16/2016  9:27 PM 02/26/2016  2:33 PM Full Code [id number]  Lance Coon, MD Inpatient   11/16/2013 12:33 AM 11/24/2013   6:53 PM Full Code [id number]  Angelica Ran, MD Inpatient    Advance Directive Documentation     Most Recent Value  Type of Advance Directive  Healthcare Power of Attorney, Living will  Pre-existing out of facility DNR order (yellow form or pink MOST form)  -  "MOST" Form in Place?  -       Prognosis:   < 6 months  Discharge Planning:  Home with Hospice  Care plan was discussed with patient, husband, RN, Dr. Manuella Ghazi, and RN CM  Thank you for allowing the Palliative Medicine Team to assist in the care of this patient.   Time In: 0850 Time Out: 0930 Total Time 61mn Prolonged Time Billed no      Greater than 50%  of this time was spent counseling and coordinating care related to the above assessment and plan.  MIhor Dow FNP-C Palliative Medicine Team  Phone: [phone]: [phone] Please contact Palliative Medicine Team phone at [phone] questions and concerns.

## 2016-12-07 NOTE — Plan of Care (Signed)
Problem: Activity: Goal: Risk for activity intolerance will decrease Outcome: Not Progressing Patient has dyspnea with exertion.  Problem: Activity: Goal: Ability to tolerate increased activity will improve Outcome: Not Progressing Patient has dyspnea with exertion.  Problem: Coping: Goal: Level of anxiety will decrease Outcome: Progressing Patient gets ativan for anxiety.  Problem: Health Behavior/Discharge Planning: Goal: Ability to manage health-related needs will improve Outcome: Not Progressing Patient needs assistance.  Problem: Activity: Goal: Ability to tolerate increased activity will improve Outcome: Not Progressing Patient has dyspnea with exertion.

## 2016-12-07 NOTE — Care Management (Signed)
Patient says her husband will transport her home and will have no issues getting into the house.  Signed DNR is on the chart.  Does not appear to have required any oral morphine solution during the night.  appears last dose of IV morphine was at 22:54 and IV ativan 3.22 0150.

## 2016-12-07 NOTE — Clinical Social Work Note (Signed)
CSW received referral for SNF.  Case discussed with case manager and plan is to discharge home with home hospice.  CSW to sign off please re-consult if social work needs arise.  Avanti Jetter R. Shaneeka [place], MSW, LCSWA [phone]  

## 2016-12-08 DIAGNOSIS — R0600 Dyspnea, unspecified: Secondary | ICD-10-CM

## 2016-12-08 MED ORDER — MORPHINE SULFATE (CONCENTRATE) 10 MG/0.5ML PO SOLN: 5.0000 mg | ORAL | 0 refills | Status: AC | PRN

## 2016-12-08 MED ORDER — LORAZEPAM 2 MG PO TABS: 1.0000 mg | ORAL_TABLET | ORAL | 0 refills | Status: AC | PRN

## 2016-12-08 MED ORDER — LORAZEPAM 2 MG PO TABS: 2.0000 mg | ORAL_TABLET | Freq: Four times a day (QID) | ORAL | 0 refills | Status: DC | PRN

## 2016-12-08 NOTE — Care Management Important Message (Signed)
Important Message  Patient Details  Name: ERLA BACCHI MRN: [id number] Date of Birth: 1935/01/20   Medicare Important Message Given:  Yes    Katrina Stack, RN 12/08/2016, 8:34 AM

## 2016-12-08 NOTE — Progress Notes (Signed)
Spoke with Dr. Manuella Ghazi regarding low BP. No new orders at this time.

## 2016-12-08 NOTE — Progress Notes (Signed)
Alert and oriented. Vital signs stable . No signs of acute distress. Discharge instructions given to patient and husband. Patient/husband verbalized understanding. No other issues noted at this time.

## 2016-12-08 NOTE — Discharge Instructions (Signed)
° °  [facility].    This information is not intended to replace advice given to you by your health care provider. Make sure you discuss any questions you have with your health care provider.  Document Released: 12/22/2003 Document Revised: 04/20/2016 Document Reviewed: 07/15/2013  Elsevier Interactive Patient Education  2017 Elsevier Inc.

## 2016-12-08 NOTE — Care Management (Addendum)
Spoke with [facility] and informed the hospital bed was delivered 3.22.2018.  Informed that discharge order is present for today.  Mr Lampron to transport patient home and he is firm that patient can get into the house and no need for ems.   Patient will have home visit today.  Updated primary nurse that CM has  completed discharge plan.

## 2016-12-08 NOTE — Progress Notes (Signed)
Daily Progress Note   Patient Name: Toni [name]       Date: 12/08/2016 DOB: 28-Jan-1935  Age: 82 y.o. MRN#: [id number] Attending Physician: Max Sane, MD Primary Care Physician: Sandi Mariscal, FNP Admit Date: 11/29/2016  Reason for Consultation/Follow-up: Establishing goals of care  Subjective: Patient awake, alert, and sitting in recliner eating breakfast. Dyspnea at rest. Very tearful this morning on the thought of leaving her husband. Husband at beside. Educated again on hospice services being great support for her when home with focus on symptom management to relieve suffering and maintain her quality of life with a chronic, progressive disease. Also, preventing hospitalizations and dignity when end-of-life is imminent. Very supportive husband and family. He states "we will have quality days."   Provided emotional and spiritual support. RN at bedside to give prn albuterol and prn morphine prior to discharge home today with hospice.    Length of Stay: 9  Current Medications: Scheduled Meds:  . mouth rinse  15 mL Mouth Rinse BID    Continuous Infusions:  PRN Meds: acetaminophen **OR** acetaminophen, albuterol, LORazepam, morphine injection, morphine CONCENTRATE  Physical Exam  Constitutional: She is oriented to person, place, and time. She is cooperative.  HENT:  Head: Normocephalic and atraumatic.  Cardiovascular: Normal heart sounds.  A regularly irregular rhythm present.  Pulmonary/Chest: She has decreased breath sounds.  Dyspnea at rest. RN to give prn morphine.   Abdominal: Normal appearance.  Neurological: She is alert and oriented to person, place, and time.  forgetful  Skin: Skin is warm and dry.  Psychiatric: Her speech is normal.  Tearful/sad  Nursing note  and vitals reviewed.          Vital Signs: BP (!) 80/40 (BP Location: Left Arm)   Pulse (!) 56   Temp 97.4 F (36.3 C) (Oral)   Resp 20   Ht '5\' 6"'$  (1.676 m)   Wt 83.2 kg (183 lb 6.8 oz)   SpO2 100%   BMI 29.61 kg/m  SpO2: SpO2: 100 % O2 Device: O2 Device: Nasal Cannula O2 Flow Rate: O2 Flow Rate (L/min): 2 L/min  Intake/output summary:   Intake/Output Summary (Last 24 hours) at 12/08/16 0959 Last data filed at 12/07/16 1022  Gross per 24 hour  Intake  222 ml  Output                0 ml  Net              222 ml   LBM: Last BM Date: 12/07/16 Baseline Weight: Weight: 83.8 kg (184 lb 12.8 oz) Most recent weight: Weight: 83.2 kg (183 lb 6.8 oz)       Palliative Assessment/Data: PPS 50%   Flowsheet Rows     Most Recent Value  Intake Tab  Referral Department  Hospitalist  Unit at Time of Referral  Cardiac/Telemetry Unit  Palliative Care Primary Diagnosis  Pulmonary  Date Notified  12/04/16  Palliative Care Type  Return patient Palliative Care  Reason for referral  Clarify Goals of Care  Date of Admission  11/29/16  Date first seen by Palliative Care  12/05/16  # of days Palliative referral response time  1 Day(s)  # of days IP prior to Palliative referral  5  Clinical Assessment  Palliative Performance Scale Score  50%  Psychosocial & Spiritual Assessment  Palliative Care Outcomes  Patient/Family meeting held?  Yes  Who was at the meeting?  patient  Palliative Care Outcomes  Clarified goals of care, Provided psychosocial or spiritual support, ACP counseling assistance, Linked to palliative care logitudinal support      Patient Active Problem List   Diagnosis Date Noted  . Encounter for hospice care discussion   . Acute on chronic diastolic heart failure ([place])   . Acute respiratory distress 11/22/2016  . Elevated troponin 11/22/2016  . Dyspnea   . Advance care planning   . Goals of care, counseling/discussion   . Palliative care by specialist   .  Congestive heart failure ([place])   . PAF (paroxysmal atrial fibrillation) ([place]) 11/09/2016  . Cough   . Anemia in chronic kidney disease 11/07/2016  . Aortic valve stenosis, moderate 08/03/2016  . Adjustment disorder with anxiety 02/23/2016  . Dyspepsia   . Esophageal reflux   . Encounter for anticoagulation discussion and counseling 11/17/2015  . Fall 11/17/2015  . Essential hypertension, benign   . Lung infiltrate on CT 06/08/2015  . Chronic diastolic CHF (congestive heart failure) ([place]) 05/17/2015  . Insomnia 04/17/2014  . Squamous cell lung cancer, right ([place]) 03/27/2014  . Postnasal drip 03/05/2014  . Hyperlipidemia 12/04/2013  . Bilateral leg edema 12/04/2013  . COPD exacerbation ([place]) 11/18/2013  . Atypical atrial flutter ([place]) 11/16/2013  . COPD (chronic obstructive pulmonary disease) ([place]) 11/15/2013  . Chronic kidney disease 08/24/2013  . Essential (primary) hypertension 08/24/2013  . Adult hypothyroidism 08/24/2013  . Acute exacerbation of chronic obstructive airways disease ([place]) 08/24/2013  . Chronic obstructive pulmonary disease with acute exacerbation ([place]) 08/24/2013    Palliative Care Assessment & Plan   Patient Profile: 82 y.o. female  with past medical history of COPD on HS home oxygen, lung nodule, RUL squamous cell lung carcinoma, CKD II, essential hypertension, chronic diastolic heart failure, paroxysmal atrial fibrillation and hypothyroidisim admitted on 11/29/2016 with shortness of breath. Recently discharged on 11/23/16 with COPD exacerbation. In ED, positive for influenza B with LUL infiltrate. Also with acute COPD exacerbation with acute hypoxic respiratory failure--receiving tamiflu, levaquin, IV steroids, nebulizers, and inhalers. Currently on O2 at 2L. Acute on chronic diastolic CHF and receiving torsemide. Followed by Dr. Grayland Ormond for RUL squamous cell lung carcinoma. Note reviewed from January 2018. Palliative medicine consultation for goals of care. [facility]  Palliative NP, Leonard Downing saw patient in February. Note reviewed.  Assessment: COPD exacerbation Acute on chronic hypoxic respiratory failure Influenza B Atrial fibrillation Acute on chronic diastolic CHF  Hypertension  Recommendations/Plan:  DNR/DNI  Comfort measures.   Discharge home with hospice today.   Goals of Care and Additional Recommendations:  Limitations on Scope of Treatment: Full Comfort Care  Code Status:   DNR   Code Status Orders        Start     Ordered   12/06/16 1447  DNR (Do not attempt resuscitation)  Continuous    Question Answer Comment  In the event of cardiac or respiratory ARREST Do not call a "code blue"   In the event of cardiac or respiratory ARREST Do not perform Intubation, CPR, defibrillation or ACLS   In the event of cardiac or respiratory ARREST Use medication by any route, position, wound care, and other measures to relive pain and suffering. May use oxygen, suction and manual treatment of airway obstruction as needed for comfort.      12/06/16 1447    Code Status History    Date Active Date Inactive Code Status Order ID Comments User Context   11/29/2016  4:05 PM 12/06/2016  2:47 PM Full Code [id number]  Hillary Bow, MD ED   11/22/2016  2:07 AM 11/23/2016  4:43 PM Full Code [id number]  Harvie Bridge, DO Inpatient   11/15/2016 12:21 PM 11/19/2016  6:24 PM DNR [id number]  Earlie Counts, NP Inpatient   11/03/2016  8:39 PM 11/15/2016 12:21 PM Full Code [id number]  Gladstone Lighter, MD Inpatient   02/16/2016  9:27 PM 02/26/2016  2:33 PM Full Code [id number]  Lance Coon, MD Inpatient   11/16/2013 12:33 AM 11/24/2013  6:53 PM Full Code [id number]  Angelica Ran, MD Inpatient    Advance Directive Documentation     Most Recent Value  Type of Advance Directive  Healthcare Power of Attorney, Living will  Pre-existing out of facility DNR order (yellow form or pink MOST form)  -  "MOST" Form in Place?  -       Prognosis:   < 6  months  Discharge Planning:  Home with Hospice  Care plan was discussed with patient, husband, RN, Dr. Manuella Ghazi, and RN CM  Thank you for allowing the Palliative Medicine Team to assist in the care of this patient.   Time In: 0930 Time Out: 1005 Total Time 41mn Prolonged Time Billed no      Greater than 50%  of this time was spent counseling and coordinating care related to the above assessment and plan.  MIhor Dow FNP-C Palliative Medicine Team  Phone: [phone]: [phone] Please contact Palliative Medicine Team phone at [phone] questions and concerns.

## 2016-12-10 NOTE — Discharge Summary (Signed)
[facility] [place] NAME: Toni [name]    MR#:  [id number]  DATE OF BIRTH:  11/03/1934  DATE OF ADMISSION:  11/29/2016   ADMITTING PHYSICIAN: Hillary Bow, MD  DATE OF DISCHARGE: 12/08/2016 11:19 AM  PRIMARY CARE PHYSICIAN: Sandi Mariscal, FNP   ADMISSION DIAGNOSIS:  Acute respiratory distress [R06.03] COPD exacerbation (HCC) [J44.1] Elevated troponin I level [R74.8] Atrial fibrillation with tachycardic ventricular rate ([place]) [I48.91] DISCHARGE DIAGNOSIS:  Active Problems:   COPD exacerbation (HCC)   Acute on chronic diastolic heart failure ([place])   Encounter for hospice care discussion  SECONDARY DIAGNOSIS:   Past Medical History:  Diagnosis Date  . Atypical atrial flutter ([place]) 11/16/2013   a. CHA2DS2VASc = 5-->prev on xarelto, d/c'd due to bleeding skin tear;  c. 11/2013->Placed on flecainide.  . Cataract   . Chronic diastolic CHF (congestive heart failure) ([place])    a. 11/2013 Echo: EF 55-60%, mild AS/MR, mod dil LA/RA, PASP 52mHg.  .Marland KitchenChronic diastolic heart failure ([place]   . Collagen vascular disease ([place]   . COPD (chronic obstructive pulmonary disease) ([place]   . Essential hypertension, benign   . Hypokalemia   . Hypothyroidism   . 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 Lung cancer ([place]   . Lung nodule    Followed by Dr. FRaul Del . PAF (paroxysmal atrial fibrillation) ([place]    a. 11/2013 Echo: EF 55-60%, mild AS/MR, mod dil LA/RA, PASP 333mg;  b. CHA2DS2VASc = 5-->prev on xarelto, d/c'd due to bleeding skin tear;  c. 11/2013->Placed on flecainide.   HOSPITAL COURSE:  [age].o.femalewith known history of COPD on HS home oxygen, lung nodule, RUL squamous cell lung carcinoma, CKD II, essential hypertension, chronic diastolic heart failure, paroxysmal atrial fibrillation and hypothyroidisim admitted on 3/14/2018with shortness of breath. Recently discharged on 11/23/16 with COPD exacerbation. In ED,  positive for influenza B with LUL infiltrate. Also with acute COPD exacerbation with acute hypoxic respiratory failure--receiving tamiflu, levaquin, IV steroids, nebulizers, and inhalers. Currently on O2 at 2L. Acute on chronic diastolic CHF and receiving torsemide. Followed by Dr. FiGrayland Ormondor RUL squamous cell lung carcinoma.   * Influenza B with LUL infiltrate * Acute COPD exacerbation with acute hypoxic respiratory failure Due to influenza B * Atrial fibrillation with rapid ventricular rate * Acute on chronic diastolic CHF, mild * Hypertension  Overall doing very poorly and has extremely poor prognosis. Met with Palliative care and decided to go home with Hospice. DISCHARGE CONDITIONS:  stable CONSULTS OBTAINED:   DRUG ALLERGIES:   Allergies  Allergen Reactions  . Belladonna Alkaloids Anaphylaxis and Other (See Comments)    Pt states that this medication makes her eyes cross.    . Dabigatran Other (See Comments)   DISCHARGE MEDICATIONS:   Allergies as of 12/08/2016      Reactions   Belladonna Alkaloids Anaphylaxis, Other (See Comments)   Pt states that this medication makes her eyes cross.     Dabigatran Other (See Comments)      Medication List    STOP taking these medications   albuterol 108 (90 Base) MCG/ACT inhaler Commonly known as:  PROVENTIL HFA   ALPRAZolam 0.25 MG tablet Commonly known as:  XANAX   budesonide 0.5 MG/2ML nebulizer solution Commonly known as:  PULMICORT   cyanocobalamin 1000 MCG/ML injection Commonly known as:  (VITAMIN B-12)   diltiazem 300 MG 24 hr capsule Commonly known as:  CARDIZEM CD  formoterol 20 MCG/2ML nebulizer solution Commonly known as:  PERFOROMIST   ipratropium-albuterol 0.5-2.5 (3) MG/3ML Soln Commonly known as:  DUONEB   levothyroxine 75 MCG tablet Commonly known as:  SYNTHROID, LEVOTHROID   loratadine 10 MG tablet Commonly known as:  CLARITIN   nystatin 100000 UNIT/ML suspension Commonly known as:  MYCOSTATIN    omega-3 acid ethyl esters 1 g capsule Commonly known as:  LOVAZA   omeprazole 20 MG capsule Commonly known as:  PRILOSEC   potassium chloride 10 MEQ tablet Commonly known as:  K-DUR,KLOR-CON   predniSONE 10 MG (21) Tbpk tablet Commonly known as:  STERAPRED UNI-PAK 21 TAB   torsemide 20 MG tablet Commonly known as:  DEMADEX     TAKE these medications   LORazepam 2 MG tablet Commonly known as:  ATIVAN Take 0.5 tablets (1 mg total) by mouth every 4 (four) hours as needed for anxiety. Notes to patient:  Last dose given today at 3am   morphine CONCENTRATE 10 MG/0.5ML Soln concentrated solution Place 0.25 mLs (5 mg total) under the tongue every hour as needed for moderate pain, severe pain or shortness of breath (air hunger). Notes to patient:  Last dose given today at [place]:   DIET:  Regular diet DISCHARGE CONDITION:  Fair ACTIVITY:  Activity as tolerated OXYGEN:  Home Oxygen: No.  Oxygen Delivery: room air DISCHARGE LOCATION:  home   If you experience worsening of your admission symptoms, develop shortness of breath, life threatening emergency, suicidal or homicidal thoughts you must seek medical attention immediately by calling 911 or calling your MD immediately  if symptoms less severe.  You Must read complete instructions/literature along with all the possible adverse reactions/side effects for all the Medicines you take and that have been prescribed to you. Take any new Medicines after you have completely understood and accpet all the possible adverse reactions/side effects.   Please note  You were cared for by a hospitalist during your hospital stay. If you have any questions about your discharge medications or the care you received while you were in the hospital after you are discharged, you can call the unit and asked to speak with the hospitalist on call if the hospitalist that took care of you is not available. Once you are  discharged, your primary care physician will handle any further medical issues. Please note that NO REFILLS for any discharge medications will be authorized once you are discharged, as it is imperative that you return to your primary care physician (or establish a relationship with a primary care physician if you do not have one) for your aftercare needs so that they can reassess your need for medications and monitor your lab values.    On the day of Discharge:  VITAL SIGNS:  Blood pressure (!) 80/40, pulse (!) 56, temperature 97.4 F (36.3 C), temperature source Oral, resp. rate 20, height _0  (1.676 m), weight 83.2 kg (183 lb 6.8 oz), SpO2 100 %. PHYSICAL EXAMINATION:  GENERAL:  82 y.o.-year-old patient lying in the bed with no acute distress.  EYES: Pupils equal, round, reactive to light and accommodation. No scleral icterus. Extraocular muscles intact.  HEENT: Head atraumatic, normocephalic. Oropharynx and nasopharynx clear.  NECK:  Supple, no jugular venous distention. No thyroid enlargement, no tenderness.  LUNGS: Normal breath sounds bilaterally, no wheezing, rales,rhonchi or crepitation. No use of accessory muscles of respiration.  CARDIOVASCULAR: S1, S2 normal. No murmurs, rubs, or gallops.  ABDOMEN:  Soft, non-tender, non-distended. Bowel sounds present. No organomegaly or mass.  EXTREMITIES: No pedal edema, cyanosis, or clubbing.  NEUROLOGIC: Cranial nerves II through XII are intact. Muscle strength 5/5 in all extremities. Sensation intact. Gait not checked.  PSYCHIATRIC: The patient is alert and oriented x 3.  SKIN: No obvious rash, lesion, or ulcer.  DATA REVIEW:   CBC  Recent Labs Lab 12/05/16 0718  WBC 9.0  HGB 11.3*  HCT 33.6*  PLT 258    Chemistries   Recent Labs Lab 12/05/16 0718 12/06/16 0535  NA 135  --   K 3.8  --   CL 95*  --   CO2 29  --   GLUCOSE 115*  --   BUN 84*  --   CREATININE 1.64* 1.79*  CALCIUM 8.4*  --     Follow-up Information     SHAFFER, MARI-JANE, FNP. Go on 12/19/2016.   Specialty:  Family Medicine Why:  Tuesday at 1:40pm for hospital follow-up Contact information: [address] [place] [address] [phone]           Management plans discussed with the patient, family and they are in agreement.  CODE STATUS: Prior   TOTAL TIME TAKING CARE OF THIS PATIENT: 45 minutes.    Max Sane M.D on 12/10/2016 at 5:44 PM  Between 7am to 6pm - Pager - [phone]  After 6pm go to [email or web address] - Proofreader  [facility]  Office  [phone]  CC: Primary care physician; Sandi Mariscal, FNP   Note: This dictation was prepared with Dragon dictation along with smaller phrase technology. Any transcriptional errors that result from this process are unintentional.

## 2016-12-14 ENCOUNTER — Ambulatory Visit: Payer: Medicare Other | Admitting: Family

## 2016-12-17 DEATH — deceased

## 2016-12-21 ENCOUNTER — Ambulatory Visit: Payer: Medicare Other | Admitting: Internal Medicine

## 2017-01-31 ENCOUNTER — Ambulatory Visit: Payer: Medicare Other

## 2017-02-05 ENCOUNTER — Ambulatory Visit: Payer: Medicare Other | Admitting: Oncology

## 2017-11-21 IMAGING — CT CT CHEST W/O CM
1 series · 15 of 33 positions shown, 19 images · non-contrast
Comparison: Chest CT 03/10/2014 and 05/28/2015.  PET-CT 11/19/2014

CLINICAL DATA: History of lung cancer. Radiation treatment only.
Followup right lung nodule.

EXAM:
CT CHEST WITHOUT CONTRAST
TECHNIQUE: Multidetector CT imaging of the chest was performed following the
standard protocol without IV contrast.

[Series 2: routine chest wo · axial · 0.68mm/px · z∈[-474,-199]mm · 15 of 65 slices shown, 19 images]
[im 5/65  mediastinal]
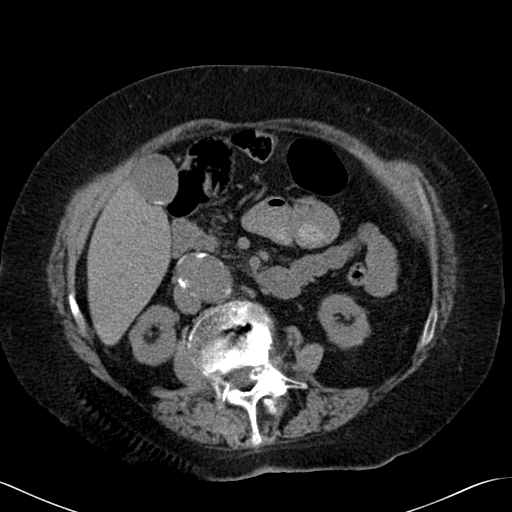
[im 5/65  lung]
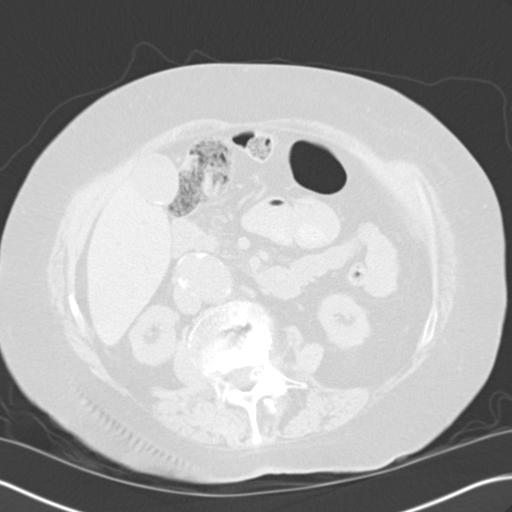
[im 10/65  lung]
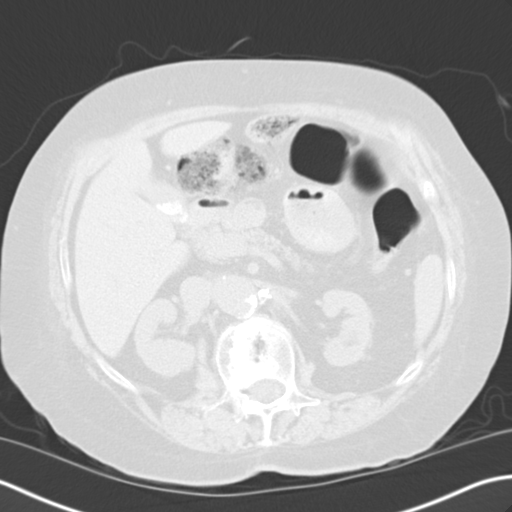
[im 13/65  lung]
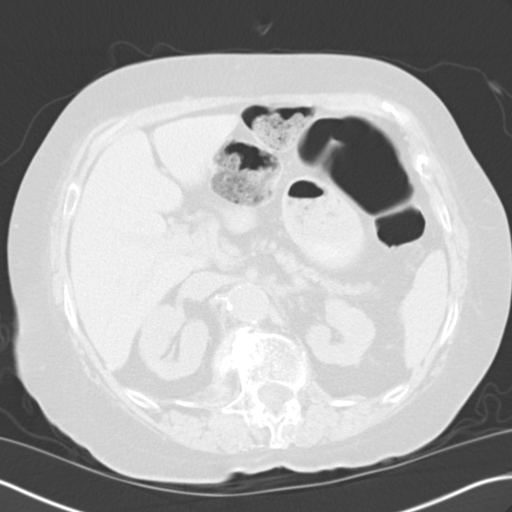
[im 17/65  lung]
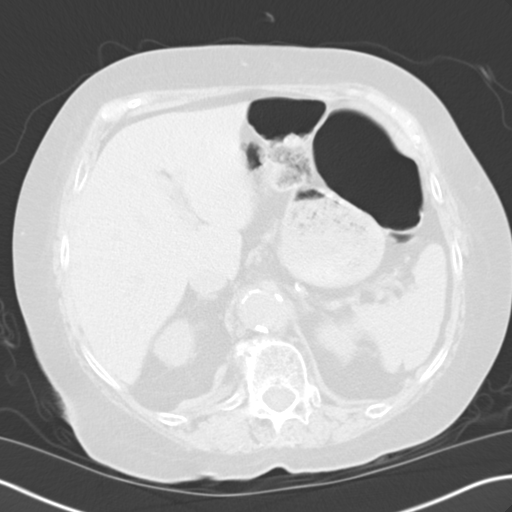
[im 22/65  mediastinal]
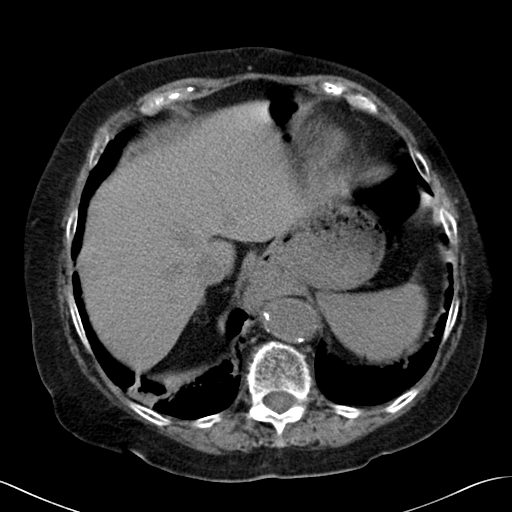
[im 22/65  lung]
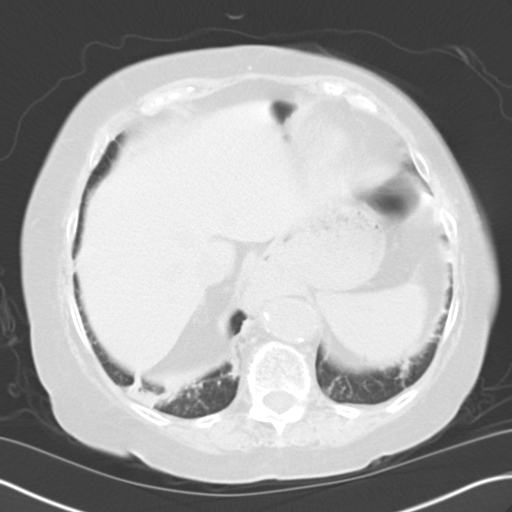
[im 26/65  lung]
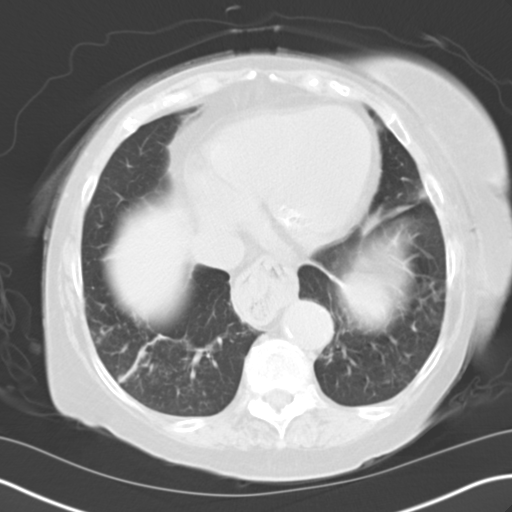
[im 29/65  lung]
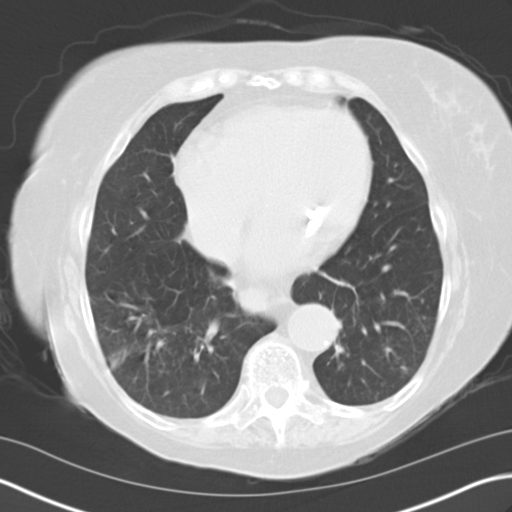
[im 34/65  lung]
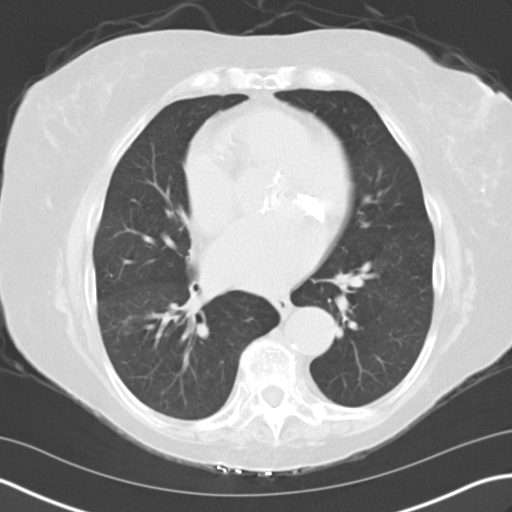
[im 36/65  mediastinal]
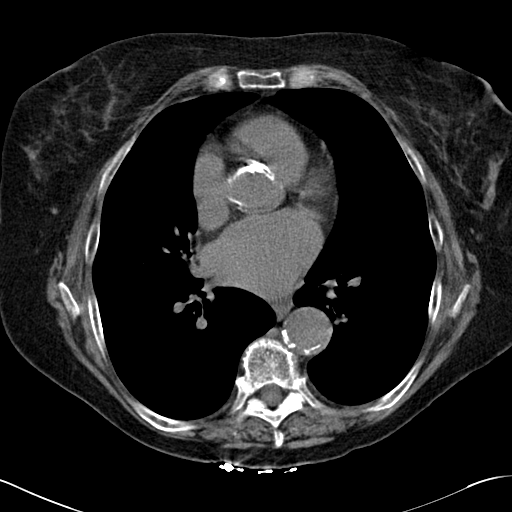
[im 36/65  lung]
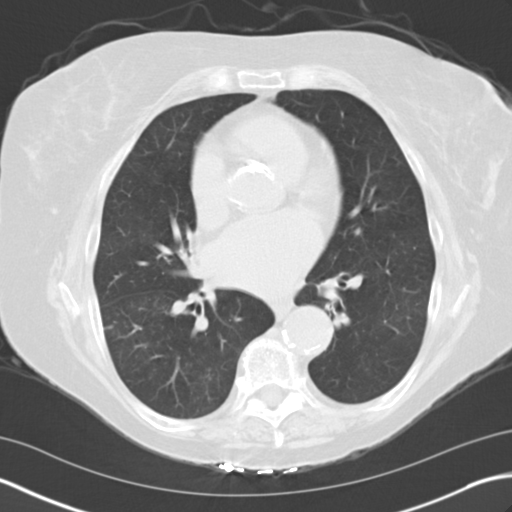
[im 39/65  lung]
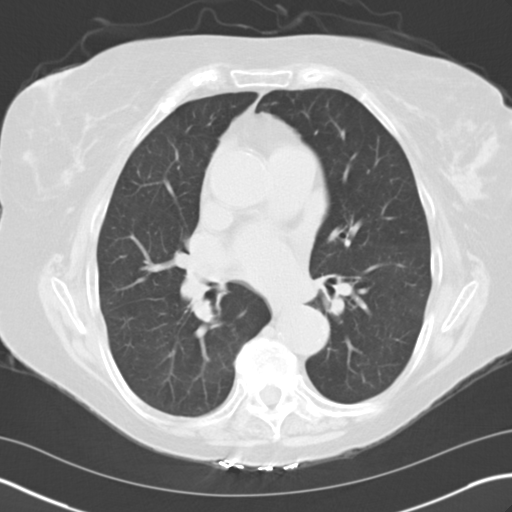
[im 43/65  lung]
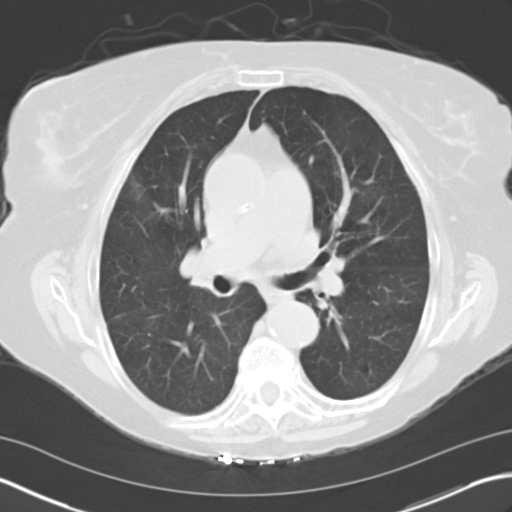
[im 48/65  lung]
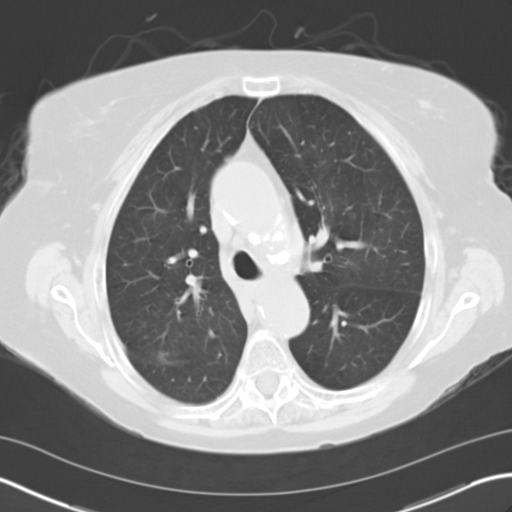
[im 52/65  mediastinal]
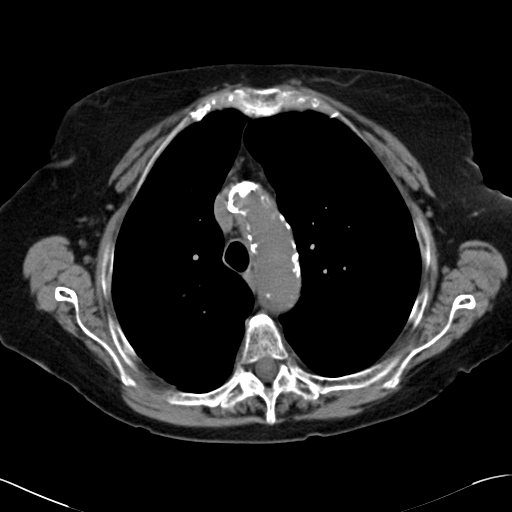
[im 52/65  lung]
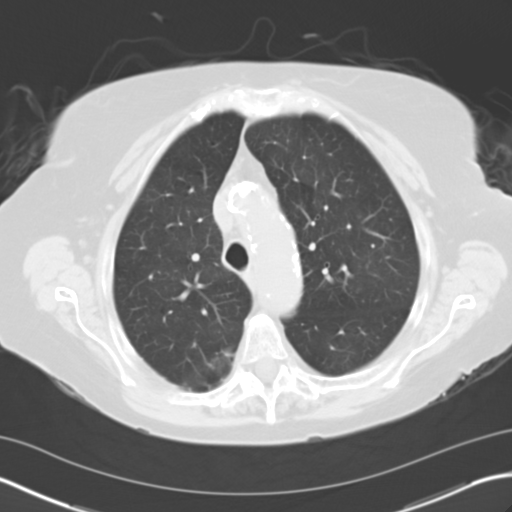
[im 55/65  lung]
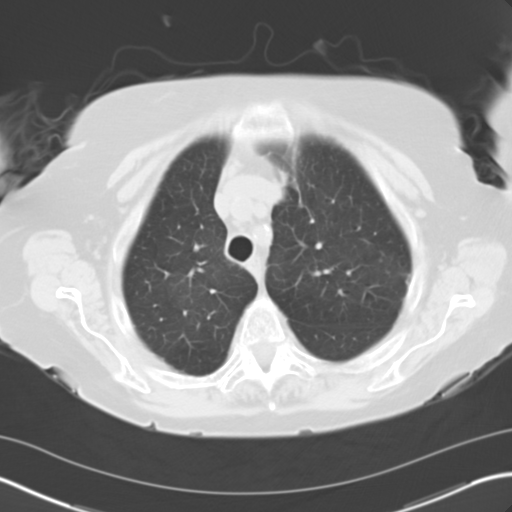
[im 60/65  lung]
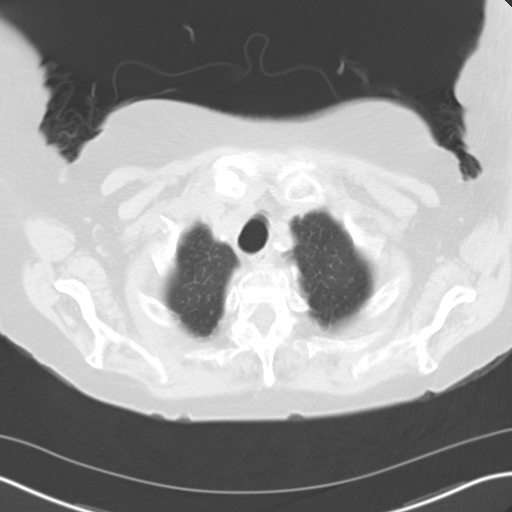

[15 of 33 positions shown; findings below may reference images not displayed]

FINDINGS: Mediastinum/Nodes: No breast masses, supraclavicular or axillary
lymphadenopathy. Small scattered lymph nodes are noted. The thyroid
gland is grossly normal.

The heart is normal in size. No pericardial effusion. Stable aortic
and branch vessel atherosclerotic calcifications. Stable coronary
artery calcifications.

Small scattered mediastinal and hilar lymph nodes are unchanged.
11.5 mm precarinal lymph node is stable. The esophagus is grossly
normal. There is a stable moderate-sized hiatal hernia.

Lungs/Pleura: Small residual nodular density in the superior segment
right lower lobe with surrounding radiation changes. This nodule
measures approximately 7 mm and originally measured approximately
15.5 mm. No interval change in the 7.5 x 6.0 mm left lower lobe
pulmonary nodule on image number 20.

Stable underlying mild emphysematous changes. No acute pulmonary
findings. No new pulmonary lesions. No pleural effusion. Bilateral
lower lobe tree-in-bud appearance is new or progressive when
compared to the prior CT and is most likely due to inflammation or
atypical infection such as GERRIT.

Upper abdomen: Cholelithiasis is noted. There is tortuosity, ectasia
and calcification of the abdominal aorta. No focal aneurysm. The
adrenal glands appear normal.

Musculoskeletal: No acute bony findings. Advanced osteoporosis and
stable mild compression deformities. Severe degenerative changes
noted in the upper lumbar spine.
IMPRESSION: 1. Small residual nodular density in the right lower lobe surrounded
by radiation changes. The nodule measures a maximum of 7 mm and
previously measured 15.5 mm.
2. Stable left lower lobe pulmonary nodule (7.5 x 6.0 mm).
3. Bilateral lower lobe tree-in-bud appearance likely representing
inflammation or atypical infection such as GERRIT.
4. Small scattered stable mediastinal and hilar lymph nodes but no
overt adenopathy.
5. Stable atherosclerotic changes involving the thoracic and
abdominal aorta.
